# Patient Record
Sex: Male | Born: 1935
Health system: Southern US, Community
[De-identification: ages and names within clinical notes are randomized; demographics above are authoritative.]

## PROBLEM LIST (undated history)

## (undated) DIAGNOSIS — E1165 Type 2 diabetes mellitus with hyperglycemia: Secondary | ICD-10-CM

## (undated) DIAGNOSIS — H409 Unspecified glaucoma: Secondary | ICD-10-CM

## (undated) DIAGNOSIS — M1990 Unspecified osteoarthritis, unspecified site: Secondary | ICD-10-CM

## (undated) DIAGNOSIS — Z8489 Family history of other specified conditions: Secondary | ICD-10-CM

## (undated) DIAGNOSIS — T4145XA Adverse effect of unspecified anesthetic, initial encounter: Secondary | ICD-10-CM

## (undated) DIAGNOSIS — I10 Essential (primary) hypertension: Secondary | ICD-10-CM

## (undated) DIAGNOSIS — K118 Other diseases of salivary glands: Secondary | ICD-10-CM

## (undated) DIAGNOSIS — M109 Gout, unspecified: Secondary | ICD-10-CM

## (undated) DIAGNOSIS — I639 Cerebral infarction, unspecified: Secondary | ICD-10-CM

## (undated) DIAGNOSIS — T8859XA Other complications of anesthesia, initial encounter: Secondary | ICD-10-CM

## (undated) HISTORY — DX: Cerebral infarction, unspecified: I63.9

---
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
## 1999-10-30 ENCOUNTER — Emergency Department (HOSPITAL_COMMUNITY): Admission: EM | Admit: 1999-10-30 | Discharge: 1999-10-30 | Payer: Self-pay | Admitting: *Deleted

## 2005-07-18 ENCOUNTER — Emergency Department: Payer: Self-pay | Admitting: Emergency Medicine

## 2005-07-18 ENCOUNTER — Other Ambulatory Visit: Payer: Self-pay

## 2008-01-28 DIAGNOSIS — C4491 Basal cell carcinoma of skin, unspecified: Secondary | ICD-10-CM

## 2008-01-28 HISTORY — DX: Basal cell carcinoma of skin, unspecified: C44.91

## 2011-12-09 DIAGNOSIS — L570 Actinic keratosis: Secondary | ICD-10-CM | POA: Diagnosis not present

## 2011-12-09 DIAGNOSIS — D2320 Other benign neoplasm of skin of unspecified ear and external auricular canal: Secondary | ICD-10-CM | POA: Diagnosis not present

## 2011-12-09 DIAGNOSIS — Z85828 Personal history of other malignant neoplasm of skin: Secondary | ICD-10-CM | POA: Diagnosis not present

## 2011-12-09 DIAGNOSIS — L820 Inflamed seborrheic keratosis: Secondary | ICD-10-CM | POA: Diagnosis not present

## 2011-12-09 DIAGNOSIS — D485 Neoplasm of uncertain behavior of skin: Secondary | ICD-10-CM | POA: Diagnosis not present

## 2012-03-18 DIAGNOSIS — Z23 Encounter for immunization: Secondary | ICD-10-CM | POA: Diagnosis not present

## 2012-06-17 DIAGNOSIS — L708 Other acne: Secondary | ICD-10-CM | POA: Diagnosis not present

## 2012-06-17 DIAGNOSIS — K130 Diseases of lips: Secondary | ICD-10-CM | POA: Diagnosis not present

## 2012-06-17 DIAGNOSIS — L570 Actinic keratosis: Secondary | ICD-10-CM | POA: Diagnosis not present

## 2012-06-17 DIAGNOSIS — L578 Other skin changes due to chronic exposure to nonionizing radiation: Secondary | ICD-10-CM | POA: Diagnosis not present

## 2013-01-26 ENCOUNTER — Emergency Department (HOSPITAL_COMMUNITY): Payer: Medicare Other

## 2013-01-26 ENCOUNTER — Emergency Department (HOSPITAL_COMMUNITY)
Admission: EM | Admit: 2013-01-26 | Discharge: 2013-01-26 | Disposition: A | Discharge status: Home / Self Care | Payer: Medicare Other | Attending: Emergency Medicine | Admitting: Emergency Medicine

## 2013-01-26 ENCOUNTER — Encounter (HOSPITAL_COMMUNITY): Payer: Self-pay | Admitting: *Deleted

## 2013-01-26 DIAGNOSIS — Z8639 Personal history of other endocrine, nutritional and metabolic disease: Secondary | ICD-10-CM | POA: Insufficient documentation

## 2013-01-26 DIAGNOSIS — J9819 Other pulmonary collapse: Secondary | ICD-10-CM | POA: Diagnosis not present

## 2013-01-26 DIAGNOSIS — M25512 Pain in left shoulder: Secondary | ICD-10-CM

## 2013-01-26 DIAGNOSIS — R52 Pain, unspecified: Secondary | ICD-10-CM | POA: Diagnosis not present

## 2013-01-26 DIAGNOSIS — M25519 Pain in unspecified shoulder: Secondary | ICD-10-CM | POA: Insufficient documentation

## 2013-01-26 DIAGNOSIS — I10 Essential (primary) hypertension: Secondary | ICD-10-CM | POA: Insufficient documentation

## 2013-01-26 DIAGNOSIS — Z862 Personal history of diseases of the blood and blood-forming organs and certain disorders involving the immune mechanism: Secondary | ICD-10-CM | POA: Diagnosis not present

## 2013-01-26 DIAGNOSIS — IMO0001 Reserved for inherently not codable concepts without codable children: Secondary | ICD-10-CM | POA: Insufficient documentation

## 2013-01-26 DIAGNOSIS — R079 Chest pain, unspecified: Secondary | ICD-10-CM | POA: Diagnosis not present

## 2013-01-26 HISTORY — DX: Gout, unspecified: M10.9

## 2013-01-26 HISTORY — DX: Essential (primary) hypertension: I10

## 2013-01-26 LAB — BASIC METABOLIC PANEL
CO2: 21 mEq/L (ref 19–32)
Chloride: 104 mEq/L (ref 96–112)
Creatinine, Ser: 1.45 mg/dL — ABNORMAL HIGH (ref 0.50–1.35)
GFR calc Af Amer: 53 mL/min — ABNORMAL LOW (ref >90)
Sodium: 136 mEq/L (ref 135–145)

## 2013-01-26 LAB — CBC
HCT: 50 % (ref 39.0–52.0)
MCV: 90.4 fL (ref 78.0–100.0)
Platelets: 181 10*3/uL (ref 150–400)
RBC: 5.53 MIL/uL (ref 4.22–5.81)
RDW: 13 % (ref 11.5–15.5)
WBC: 8 10*3/uL (ref 4.0–10.5)

## 2013-01-26 LAB — POCT I-STAT TROPONIN I

## 2013-01-26 MED ORDER — IOHEXOL 350 MG/ML SOLN
100.0000 mL | Freq: Once | INTRAVENOUS | Status: AC | PRN
  Administered 2013-01-26: 100 mL via INTRAVENOUS

## 2013-01-26 MED ORDER — OXYCODONE-ACETAMINOPHEN 5-325 MG PO TABS: 1.0000 | ORAL_TABLET | Freq: Four times a day (QID) | ORAL | Status: DC | PRN

## 2013-01-26 MED ORDER — NAPROXEN 500 MG PO TABS: 500.0000 mg | ORAL_TABLET | Freq: Two times a day (BID) | ORAL | Status: DC

## 2013-01-26 MED ORDER — OXYCODONE-ACETAMINOPHEN 5-325 MG PO TABS
2.0000 | ORAL_TABLET | Freq: Once | ORAL | Status: AC
  Administered 2013-01-26: 2 via ORAL
  Filled 2013-01-26: qty 2

## 2013-01-26 NOTE — ED Notes (Signed)
Family at bedside. 

## 2013-01-26 NOTE — ED Notes (Signed)
Pt discharged.Vital signs stable and GCS 15 

## 2013-01-26 NOTE — ED Notes (Signed)
Report high bp-229/107 jaye cook md.

## 2013-01-26 NOTE — ED Notes (Signed)
Pt is here with left shoulder aching intermittently over last couple of weeks and now it is constant and radiates into chest and back.

## 2013-01-26 NOTE — ED Notes (Signed)
Patient transported to CT 

## 2013-01-26 NOTE — ED Provider Notes (Signed)
CSN: [id number]     Arrival date & time 01/26/13  1119 History     First MD Initiated Contact with Patient 01/26/13 1349     Chief Complaint  Patient presents with  . Left arm pain    (Consider location/radiation/quality/duration/timing/severity/associated sxs/prior Treatment) HPI 77 year old male with HTN who presents with left shoulder pain.   Patient reports that he has intermittent left shoulder and scapular pain for approximately 6 weeks. His pain recently worsened yesterday after lifting several heavy objects while cleaning out a barn.  Pain is located primarily in the scapular region and is severe in nature.  Pain is nonradiating.  No associated SOB or chest pain.  No recent fall or trauma.   Past Medical History  Diagnosis Date  . Gout   . Hypertension    Past Surgical History  Procedure Laterality Date  . Tonsillectomy     No family history on file. History  Substance Use Topics  . Smoking status: Never Smoker   . Smokeless tobacco: Not on file  . Alcohol Use: Yes     Comment: occ    Review of Systems  Constitutional: Negative for fever and chills.  Respiratory: Negative for shortness of breath.   Cardiovascular: Negative for chest pain and palpitations.  Gastrointestinal: Negative for nausea, vomiting and abdominal pain.  Genitourinary: Negative.   Musculoskeletal: Positive for myalgias and arthralgias.  Skin: Negative for rash.  Neurological: Negative for syncope, weakness and headaches.   Allergies  Review of patient's allergies indicates no known allergies.  Home Medications  No current outpatient prescriptions on file. BP 214/101  Pulse 65  Temp(Src) 98.3 F (36.8 C) (Oral)  Resp 16  SpO2 96% Physical Exam  Constitutional: He is oriented to person, place, and time. He appears well-developed and well-nourished.  HENT:  Head: Normocephalic and atraumatic.  Cardiovascular: Normal rate and regular rhythm.   No murmur heard. Pulmonary/Chest:  Effort normal and breath sounds normal. He has no wheezes. He has no rales.  Abdominal: Soft. He exhibits no distension. There is no tenderness. There is no rebound and no guarding.  Musculoskeletal:  Left shoulder - normal ROM.  No redness or erythema.  No tenderness to palpation anterior or posteriorly around the scapula.    Neurological: He is alert and oriented to person, place, and time.  Skin: Skin is warm and dry.  Psychiatric: He has a normal mood and affect.   ED Course   Procedures (including critical care time)  Labs Reviewed  BASIC METABOLIC PANEL - Abnormal; Notable for the following:    Glucose, Bld 166 (*)    Creatinine, Ser 1.45 (*)    GFR calc non Af Amer 46 (*)    GFR calc Af Amer 53 (*)    All other components within normal limits  CBC - Abnormal; Notable for the following:    Hemoglobin 18.0 (*)    All other components within normal limits  POCT I-STAT TROPONIN I   Dg Chest 2 View  01/26/2013   *RADIOLOGY REPORT*  Clinical Data: Left chest and arm pain.  CHEST - 2 VIEW  Comparison:  None.  Findings:  The heart size and mediastinal contours are within normal limits.  Both lungs are clear.  The visualized skeletal structures are unremarkable.  IMPRESSION: No active cardiopulmonary disease.   Original Report Authenticated By: Myles Rosenthal, M.D.   1. Shoulder pain, acute, left     MDM  77 year old male with HTN presents  with persistent L shoulder pain.  -  Good ROM and nontender on examination. -  Patient significantly hypertensive in the ED. patient was asymptomatic and thus did not require treatment for hypertension in the ED. -  Given presentation, examination, and BP there is concern for possible dissection.  I discussed this with attending Dr. Hyacinth Meeker.  Will obtain CTA to rule out dissection.  1640 - CT scan negative for dissection. We'll discharge home with Percocet and NSAIDs.  Will send patient home on amlodipine for hypertension. Patient instructed to follow  up with his PCP closely   Tommie Sams, DO 01/26/13 1640

## 2013-01-27 NOTE — ED Provider Notes (Signed)
I saw and evaluated the patient, reviewed the resident's note and I agree with the findings and plan.  Pertinent History: The patient has developed left shoulder pain which has occurred over the last couple of weeks that has become more persistent and has been constant for greater than 24 hours. He states that he was doing significant heavy lifting including cleaning out a barn after his father-in-law died recently. Pertinent Exam findings: Minimal pain with range of motion or palpation of the shoulder, clear heart and lung sounds, no other acute findings.  As the pain did not seem to be reproducible other sources were sought including cardiac and dissection, studies negative for any acute findings and the patient is stable for discharge. Again he agreed in 24 hours of ongoing symptoms with a normal EKG and troponin.  I personally interpreted the EKG as well as the resident and agree with the interpretation on the resident's chart.   Vida Roller, MD 01/27/13 1100

## 2013-03-22 ENCOUNTER — Ambulatory Visit (INDEPENDENT_AMBULATORY_CARE_PROVIDER_SITE_OTHER): Payer: Medicare Other | Admitting: Internal Medicine

## 2013-03-22 ENCOUNTER — Encounter: Payer: Self-pay | Admitting: Internal Medicine

## 2013-03-22 ENCOUNTER — Encounter (INDEPENDENT_AMBULATORY_CARE_PROVIDER_SITE_OTHER): Payer: Self-pay

## 2013-03-22 VITALS — BP 160/84 | HR 74 | Temp 98.1°F | Resp 14 | Ht 69.25 in | Wt 168.0 lb

## 2013-03-22 DIAGNOSIS — Z125 Encounter for screening for malignant neoplasm of prostate: Secondary | ICD-10-CM | POA: Diagnosis not present

## 2013-03-22 DIAGNOSIS — E559 Vitamin D deficiency, unspecified: Secondary | ICD-10-CM

## 2013-03-22 DIAGNOSIS — N400 Enlarged prostate without lower urinary tract symptoms: Secondary | ICD-10-CM

## 2013-03-22 DIAGNOSIS — E785 Hyperlipidemia, unspecified: Secondary | ICD-10-CM | POA: Diagnosis not present

## 2013-03-22 DIAGNOSIS — R209 Unspecified disturbances of skin sensation: Secondary | ICD-10-CM

## 2013-03-22 DIAGNOSIS — Z1211 Encounter for screening for malignant neoplasm of colon: Secondary | ICD-10-CM | POA: Diagnosis not present

## 2013-03-22 DIAGNOSIS — R200 Anesthesia of skin: Secondary | ICD-10-CM

## 2013-03-22 DIAGNOSIS — R5381 Other malaise: Secondary | ICD-10-CM

## 2013-03-22 DIAGNOSIS — M1990 Unspecified osteoarthritis, unspecified site: Secondary | ICD-10-CM

## 2013-03-22 DIAGNOSIS — K219 Gastro-esophageal reflux disease without esophagitis: Secondary | ICD-10-CM

## 2013-03-22 DIAGNOSIS — I10 Essential (primary) hypertension: Secondary | ICD-10-CM

## 2013-03-22 LAB — CBC WITH DIFFERENTIAL/PLATELET
Basophils Relative: 0.9 % (ref 0.0–3.0)
Eosinophils Relative: 3.4 % (ref 0.0–5.0)
HCT: 48.7 % (ref 39.0–52.0)
Monocytes Relative: 6.1 % (ref 3.0–12.0)
Neutrophils Relative %: 72 % (ref 43.0–77.0)
Platelets: 187 10*3/uL (ref 150.0–400.0)
RBC: 5.26 Mil/uL (ref 4.22–5.81)
WBC: 6.7 10*3/uL (ref 4.5–10.5)

## 2013-03-22 LAB — TSH: TSH: 1.29 u[IU]/mL (ref 0.35–5.50)

## 2013-03-22 LAB — LIPID PANEL
Cholesterol: 186 mg/dL (ref 0–200)
HDL: 43.5 mg/dL (ref >39.00)
Total CHOL/HDL Ratio: 4
Triglycerides: 218 mg/dL — ABNORMAL HIGH (ref 0.0–149.0)
VLDL: 43.6 mg/dL — ABNORMAL HIGH (ref 0.0–40.0)

## 2013-03-22 MED ORDER — ZOSTER VACCINE LIVE 19400 UNT/0.65ML ~~LOC~~ SOLR: 0.6500 mL | Freq: Once | SUBCUTANEOUS | Status: DC

## 2013-03-22 MED ORDER — TETANUS-DIPHTH-ACELL PERTUSSIS 5-2.5-18.5 LF-MCG/0.5 IM SUSP: 0.5000 mL | Freq: Once | INTRAMUSCULAR | Status: DC

## 2013-03-22 NOTE — Assessment & Plan Note (Signed)
Managed with diet.

## 2013-03-22 NOTE — Assessment & Plan Note (Signed)
Needs PSA and urinalysis

## 2013-03-22 NOTE — Patient Instructions (Signed)
Please return for your fasting annual exam  In 3 months   We will e mail you with the results of your labs.

## 2013-03-22 NOTE — Progress Notes (Signed)
Patient ID: Anthony Allen, male   DOB: 09-18-1935, 77 y.o.   MRN: [id number]  Patient Active Problem List   Diagnosis Date Noted  . Essential hypertension, benign 03/22/2013  . Osteoarthritis 03/22/2013  . Numbness and tingling in left hand 03/22/2013    Subjective:  CC:   Chief Complaint  Patient presents with  . Establish Care    HPI:   Anthony Allen is a 77 y.o. male who presents as a new patient to establish primary care with the chief complaint of Persistent left arm pain since July after falling down during house renovation,  Tripped and fell forward onto arms.  His left shoulder ached for a while,  Right elbow was skinned up,  Bruise on left forearm resolved but shoulder starting hurt.  Has been fdoing a lot of lifting and clearning out father in law's barnes,  Started persisistent pain in  shoulder and back ,  Took a lot of BC powders ,  Arthritis medication ,  Nothing helped, after a few weeks pain became so severe his  bp shot up and went to ER at [facility] with chest x ray and CT scan of chest done to rule other PE and Aortic dissection  No shoulder imaging done.,   Pain has now resolved but he has noticed decreased grip  And some numbness of the entire left hand comes and goes throughout the day.  No headaches or neck pain  History of migraines Brought on by wife's tobacco use.    Resting tremor fine, chronic, no progression   History of BPH 2006 treated with Flomax and another medication (not sure) .  Symptoms resolved, kept getting UTIs so he has not been on either since 2010.  Has not had a PSA in over 3 years    Hypertension, prescribed diuretic initially, but developed joint pain and weakness  Added ACE inhibitor which caused hypotension and dizziness ,  Was changed to  Amlodipine with no problems  "weak lungs"  Had bronchitis at age of 17 ,  And remote sinus problems.. No history of asthma.  never smoked, but lots of passive smoke exposure. None URIs in  years  Reflux;  Treated with dietary avoidance of spicy foods. . Has problems if eats late at night   Has never had a colonoscopy.  No FH of colon Ca.  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 Afraid to have one because of the sedation.  No history of allergies to medication or anesthesia.   Food intolerances,  Cramping with onions and cukes and garlic . Peanuts and OJ cause RUQ cramping  Has avoided them for 30 yrs.  Tannic acid foods cause cankers     Past Medical History  Diagnosis Date  . Gout   . Hypertension     Past Surgical History  Procedure Laterality Date  . Tonsillectomy      Family History  Problem Relation Age of Onset  . Arthritis Mother   . Stroke Father   . Hypertension Father   . Heart disease Father 60    AMI  . Kidney disease Father     bladder ca congenital loss of kidney  . Arthritis Maternal Grandmother   . Early death Daughter 21    aspiration    History   Social History  . Marital Status: Married    Spouse Name: N/A    Number of Children: N/A  . Years of Education: N/A   Occupational History  .  Not on file.   Social History Main Topics  . Smoking status: Never Smoker   . Smokeless tobacco: Never Used  . Alcohol Use: Yes     Comment: occ  . Drug Use: No  . Sexual Activity: Not on file   Other Topics Concern  . Not on file   Social History Narrative  . No narrative on file       @ALLHX @    Review of Systems:   The remainder of the review of systems was negative except those addressed in the HPI.       Objective:  BP 160/84  Pulse 74  Temp(Src) 98.1 F (36.7 C) (Oral)  Resp 14  Ht 5' 9.25" (1.759 m)  Wt 168 lb (76.204 kg)  BMI 24.63 kg/m2  SpO2 98%  General appearance: alert, cooperative and appears stated age Ears: normal TM's and external ear canals both ears Throat: lips, mucosa, and tongue normal; teeth and gums normal Neck: no adenopathy, no carotid bruit, supple,  symmetrical, trachea midline and thyroid not enlarged, symmetric, no tenderness/mass/nodules Back: symmetric, no curvature. ROM normal. No CVA tenderness. Lungs: clear to auscultation bilaterally Heart: regular rate and rhythm, S1, S2 normal, no murmur, click, rub or gallop Abdomen: soft, non-tender; bowel sounds normal; no masses,  no organomegaly Pulses: 2+ and symmetric Skin: Skin color, texture, turgor normal. No rashes or lesions Lymph nodes: Cervical, supraclavicular, and axillary nodes normal.  Assessment and Plan:  Numbness and tingling in left hand Etiology unclear; there is slight objective evidence of thenar atrophy without strength or sensation deficits. With no history of neck pain no history of tobacco abuse and normal recent chest CT, unlikely to be a tumor affecting the brachial plexus.  Records requested.   BPH (benign prostatic hyperplasia) Needs PSA and urinalysis  GERD (gastroesophageal reflux disease) Managed with diet.    Updated Medication List Outpatient Encounter Prescriptions as of 03/22/2013  Medication Sig Dispense Refill  . Acetaminophen (TYLENOL ARTHRITIS EXT RELIEF PO) Take 1-2 tablets by mouth 3 (three) times daily.      Marland Kitchen amLODipine (NORVASC) 5 MG tablet Take 1 tablet by mouth daily.      . celecoxib (CELEBREX) 200 MG capsule Take 200 mg by mouth 2 (two) times daily.      Marland Kitchen HYDROCORTISONE, TOPICAL, 2 % LOTN Apply 1 application topically as needed (itching).      . Liniments (SALONPAS EX) Apply 1 patch topically as needed (muscle pain).      . naproxen (NAPROSYN) 500 MG tablet Take 1 tablet (500 mg total) by mouth 2 (two) times daily with a meal.  30 tablet  0  . SALINE NA Place 2 sprays into both nostrils as needed (allergies).      . Tetrahydrozoline HCl (VISINE OP) Place 2 drops into both eyes daily as needed (allergies).      . trolamine salicylate (MYOFLEX) 10 % cream Apply 1 application topically as needed (muscle pain).      . TDaP (BOOSTRIX)  5-2.5-18.5 LF-MCG/0.5 injection Inject 0.5 mLs into the muscle once.  0.5 mL  0  . zoster vaccine live, PF, (ZOSTAVAX) 45409 UNT/0.65ML injection Inject 19,400 Units into the skin once.  1 each  0  . [DISCONTINUED] Aspirin-Salicylamide-Caffeine (BC HEADACHE POWDER PO) Take 1 packet by mouth 3 (three) times daily.      . [DISCONTINUED] oxyCODONE-acetaminophen (PERCOCET/ROXICET) 5-325 MG per tablet Take 1-2 tablets by mouth every 6 (six) hours as needed for pain.  30 tablet  0   No facility-administered encounter medications on file as of 03/22/2013.

## 2013-03-22 NOTE — Assessment & Plan Note (Signed)
Etiology unclear; there is slight objective evidence of thenar atrophy without strength or sensation deficits. With no history of neck pain no history of tobacco abuse and normal recent chest CT, unlikely to be a tumor affecting the brachial plexus.  Records requested.

## 2013-03-23 ENCOUNTER — Encounter: Payer: Self-pay | Admitting: *Deleted

## 2013-03-25 ENCOUNTER — Telehealth: Payer: Self-pay | Admitting: *Deleted

## 2013-03-25 DIAGNOSIS — I10 Essential (primary) hypertension: Secondary | ICD-10-CM

## 2013-03-25 MED ORDER — AMLODIPINE BESYLATE 5 MG PO TABS: 5.0000 mg | ORAL_TABLET | Freq: Every day | ORAL | Status: DC

## 2013-03-25 NOTE — Telephone Encounter (Signed)
Pt left VM, requesting RF on his BP medication. Rx sent to pharmacy by escript. LM for pt that Rx was sent and in the future to contact his pharmacy for refills to ensure accurate medication, dosage, pharmacy, etc.

## 2013-04-01 ENCOUNTER — Ambulatory Visit: Payer: Medicare Other | Admitting: Internal Medicine

## 2013-06-15 DIAGNOSIS — Z23 Encounter for immunization: Secondary | ICD-10-CM | POA: Diagnosis not present

## 2013-06-23 ENCOUNTER — Encounter: Payer: Self-pay | Admitting: Internal Medicine

## 2013-06-23 ENCOUNTER — Ambulatory Visit (INDEPENDENT_AMBULATORY_CARE_PROVIDER_SITE_OTHER): Payer: Medicare Other | Admitting: Internal Medicine

## 2013-06-23 ENCOUNTER — Encounter (INDEPENDENT_AMBULATORY_CARE_PROVIDER_SITE_OTHER): Payer: Self-pay

## 2013-06-23 VITALS — BP 172/84 | HR 80 | Temp 97.5°F | Resp 14 | Wt 166.2 lb

## 2013-06-23 DIAGNOSIS — Z125 Encounter for screening for malignant neoplasm of prostate: Secondary | ICD-10-CM | POA: Diagnosis not present

## 2013-06-23 DIAGNOSIS — I10 Essential (primary) hypertension: Secondary | ICD-10-CM

## 2013-06-23 DIAGNOSIS — K2970 Gastritis, unspecified, without bleeding: Secondary | ICD-10-CM

## 2013-06-23 DIAGNOSIS — Z23 Encounter for immunization: Secondary | ICD-10-CM

## 2013-06-23 DIAGNOSIS — R209 Unspecified disturbances of skin sensation: Secondary | ICD-10-CM

## 2013-06-23 DIAGNOSIS — K2990 Gastroduodenitis, unspecified, without bleeding: Secondary | ICD-10-CM

## 2013-06-23 DIAGNOSIS — Z0000 Encounter for general adult medical examination without abnormal findings: Secondary | ICD-10-CM | POA: Diagnosis not present

## 2013-06-23 DIAGNOSIS — E559 Vitamin D deficiency, unspecified: Secondary | ICD-10-CM

## 2013-06-23 DIAGNOSIS — R5381 Other malaise: Secondary | ICD-10-CM | POA: Diagnosis not present

## 2013-06-23 DIAGNOSIS — R5383 Other fatigue: Secondary | ICD-10-CM

## 2013-06-23 DIAGNOSIS — Z1211 Encounter for screening for malignant neoplasm of colon: Secondary | ICD-10-CM

## 2013-06-23 DIAGNOSIS — R202 Paresthesia of skin: Secondary | ICD-10-CM

## 2013-06-23 DIAGNOSIS — N400 Enlarged prostate without lower urinary tract symptoms: Secondary | ICD-10-CM

## 2013-06-23 DIAGNOSIS — R200 Anesthesia of skin: Secondary | ICD-10-CM

## 2013-06-23 LAB — COMPREHENSIVE METABOLIC PANEL
ALT: 29 U/L (ref 0–53)
AST: 21 U/L (ref 0–37)
Albumin: 4.3 g/dL (ref 3.5–5.2)
Alkaline Phosphatase: 85 U/L (ref 39–117)
BUN: 19 mg/dL (ref 6–23)
CALCIUM: 9.6 mg/dL (ref 8.4–10.5)
CHLORIDE: 104 meq/L (ref 96–112)
CO2: 29 meq/L (ref 19–32)
Creatinine, Ser: 1.7 mg/dL — ABNORMAL HIGH (ref 0.4–1.5)
GFR: 43.1 mL/min — AB (ref >60.00)
Glucose, Bld: 149 mg/dL — ABNORMAL HIGH (ref 70–99)
POTASSIUM: 4 meq/L (ref 3.5–5.1)
SODIUM: 139 meq/L (ref 135–145)
TOTAL PROTEIN: 7.9 g/dL (ref 6.0–8.3)
Total Bilirubin: 1.2 mg/dL (ref 0.3–1.2)

## 2013-06-23 LAB — PSA, MEDICARE: PSA: 1.52 ng/ml (ref 0.10–4.00)

## 2013-06-23 NOTE — Assessment & Plan Note (Signed)
Resolved with activity modification .

## 2013-06-23 NOTE — Assessment & Plan Note (Signed)
PSA ordered today,  Prostate exam done  No nodules

## 2013-06-23 NOTE — Patient Instructions (Addendum)
You had your annual Medicare wellness exam today   Please use the stool kit to send [place] back a sample to test for blood.  This is your colon CA screening test.   You still the TDaP vaccine and a Shingles vaccine.  Try the [facility] Dept for the best price  You received the pneumonia vaccine today.  We will contact you with the bloodwork results (PSA,  Fasting glucose,  Vit d ,  Liver and kidney function)   Your ear wax may dissolve after a few days of applying Debrox.  3 or 4 drops in each ear (alternate nights)  If not,  you can make appt with Juliann Pulse to have an irrigation done.   Managing Your High Blood Pressure Blood pressure is a measurement of how forceful your blood is pressing against the walls of the arteries. Arteries are muscular tubes within the circulatory system. Blood pressure does not stay the same. Blood pressure rises when you are active, excited, or nervous; and it lowers during sleep and relaxation. If the numbers measuring your blood pressure stay above normal most of the time, you are at risk for health problems. High blood pressure (hypertension) is a long-term (chronic) condition in which blood pressure is elevated. A blood pressure reading is recorded as two numbers, such as 120 over 80 (or 120/80). The first, higher number is called the systolic pressure. It is a measure of the pressure in your arteries as the heart beats. The second, lower number is called the diastolic pressure. It is a measure of the pressure in your arteries as the heart relaxes between beats.  Keeping your blood pressure in a normal range is important to your overall health and prevention of health problems, such as heart disease and stroke. When your blood pressure is uncontrolled, your heart has to work harder than normal. High blood pressure is a very common condition in adults because blood pressure tends to rise with age. Men and women are equally likely to have hypertension but at different times in  life. Before age 60, men are more likely to have hypertension. After 76 years of age, women are more likely to have it. Hypertension is especially common in African Americans. This condition often has no signs or symptoms. The cause of the condition is usually not known. Your caregiver can help you come up with a plan to keep your blood pressure in a normal, healthy range. BLOOD PRESSURE STAGES Blood pressure is classified into four stages: normal, prehypertension, stage 1, and stage 2. Your blood pressure reading will be used to determine what type of treatment, if any, is necessary. Appropriate treatment options are tied to these four stages:  Normal  Systolic pressure (mm Hg): below 120.  Diastolic pressure (mm Hg): below 80. Prehypertension  Systolic pressure (mm Hg): 120 to 139.  Diastolic pressure (mm Hg): 80 to 89. Stage1  Systolic pressure (mm Hg): 140 to 159.  Diastolic pressure (mm Hg): 90 to 99. Stage2  Systolic pressure (mm Hg): 160 or above.  Diastolic pressure (mm Hg): 100 or above. RISKS RELATED TO HIGH BLOOD PRESSURE Managing your blood pressure is an important responsibility. Uncontrolled high blood pressure can lead to:  A heart attack.  A stroke.  A weakened blood vessel (aneurysm).  Heart failure.  Kidney damage.  Eye damage.  Metabolic syndrome.  Memory and concentration problems. HOW TO MANAGE YOUR BLOOD PRESSURE Blood pressure can be managed effectively with lifestyle changes and medicines (if needed). Your caregiver  will help you come up with a plan to bring your blood pressure within a normal range. Your plan should include the following: Education  Read all information provided by your caregivers about how to control blood pressure.  Educate yourself on the latest guidelines and treatment recommendations. New research is always being done to further define the risks and treatments for high blood pressure. Lifestylechanges  Control your  weight.  Avoid smoking.  Stay physically active.  Reduce the amount of salt in your diet.  Reduce stress.  Control any chronic conditions, such as high cholesterol or diabetes.  Reduce your alcohol intake. Medicines  Several medicines (antihypertensive medicines) are available, if needed, to bring blood pressure within a normal range. Communication  Review all the medicines you take with your caregiver because there may be side effects or interactions.  Talk with your caregiver about your diet, exercise habits, and other lifestyle factors that may be contributing to high blood pressure.  See your caregiver regularly. Your caregiver can help you create and adjust your plan for managing high blood pressure. RECOMMENDATIONS FOR TREATMENT AND FOLLOW-UP  The following recommendations are based on current guidelines for managing high blood pressure in nonpregnant adults. Use these recommendations to identify the proper follow-up period or treatment option based on your blood pressure reading. You can discuss these options with your caregiver.  Systolic pressure of 695 to 072 or diastolic pressure of 80 to 89: Follow up with your caregiver as directed.  Systolic pressure of 257 to 505 or diastolic pressure of 90 to 100: Follow up with your caregiver within 2 months.  Systolic pressure above 183 or diastolic pressure above 358: Follow up with your caregiver within 1 month.  Systolic pressure above 251 or diastolic pressure above 898: Consider antihypertensive therapy; follow up with your caregiver within 1 week.  Systolic pressure above 421 or diastolic pressure above 031: Begin antihypertensive therapy; follow up with your caregiver within 1 week. Document Released: 02/19/2012 Document Reviewed: 02/19/2012 [facility] Patient Information 2014 [place], [place].

## 2013-06-23 NOTE — Assessment & Plan Note (Signed)
Well controlled on current regimen. Renal function stable, no changes today. 

## 2013-06-23 NOTE — Assessment & Plan Note (Signed)
Annual male exam was done including testicular and prostate exam. PSA is pending .  Colon ca screening was reviewed and options given.  FOBT cards accepted

## 2013-06-23 NOTE — Progress Notes (Signed)
Patient ID: Anthony Allen, male   DOB: [birth year], 78 y.o.   MRN: [id number]  The patient is here for annual Medicare wellness examination and management of other chronic and acute problems.  Increased anxiety ,  His 79 yr old Son has been hospitalized after leaving home in a decompensated manic state,  THe has a long history of bipolar d/o and lives with them ,  Has religious delusions.   Not sleeping well.  Averages 6 hours,  Takes occasional naps during day,  denies hypersomnolence.  More irritable  Lately,  Denies trouble concentrating. . Averaging 2 drinks per night pf alcohol.  No other habits.     The risk factors are reflected in the social history.  The roster of all physicians providing medical care to patient - is listed in the Snapshot section of the chart.  Activities of daily living:  The patient is 100% independent in all ADLs: dressing, toileting, feeding as well as independent mobility  Home safety : The patient has smoke detectors in the home. They wear seatbelts.  There are no firearms at home. There is no violence in the home.   There is no risks for hepatitis, STDs or HIV. There is no   history of blood transfusion. They have no travel history to infectious disease endemic areas of the world.  The patient has seen their dentist in the last six month. They have seen their eye doctor in the last year. They admit to slight hearing difficulty with regard to whispered voices and some television programs.  They have deferred audiologic testing in the last year.  They do not  have excessive sun exposure. Discussed the need for sun protection: hats, long sleeves and use of sunscreen if there is significant sun exposure.   Diet: the importance of a healthy diet is discussed. They do have a healthy diet.  The benefits of regular aerobic exercise were discussed. She walks 4 times per week ,  20 minutes.   Depression screen: there are no signs or vegative symptoms of depression-  irritability, change in appetite, anhedonia, sadness/tearfullness.  Cognitive assessment: the patient manages all their financial and personal affairs and is actively engaged. They could relate day,date,year and events; recalled 2/3 objects at 3 minutes; performed clock-face test normally.  The following portions of the patient's history were reviewed and updated as appropriate: allergies, current medications, past family history, past medical history,  past surgical history, past social history  and problem list.  Visual acuity was not assessed per patient preference since she has regular follow up with her ophthalmologist. Hearing and body mass index were assessed and reviewed.   During the course of the visit the patient was educated and counseled about appropriate screening and preventive services including : fall prevention , diabetes screening, nutrition counseling, colorectal cancer screening, and recommended immunizations.    Objective:   BP 172/84  Pulse 80  Temp(Src) 97.5 F (36.4 C) (Oral)  Resp 14  Wt 166 lb 4 oz (75.411 kg)  SpO2 97%  General Appearance:    Alert, cooperative, no distress, appears stated age  Head:    Normocephalic, without obvious abnormality, atraumatic  Eyes:    PERRL, conjunctiva/corneas clear, EOM's intact, fundi    benign, both eyes       Ears:    Normal TM's and external ear canals, both ears  Nose:   Nares normal, septum midline, mucosa normal, no drainage   or sinus tenderness  Throat:   Lips,  mucosa, and tongue normal; teeth and gums normal  Neck:   Supple, symmetrical, trachea midline, no adenopathy;       thyroid:  No enlargement/tenderness/nodules; no carotid   bruit or JVD  Back:     Symmetric, no curvature, ROM normal, no CVA tenderness  Lungs:     Clear to auscultation bilaterally, respirations unlabored  Chest wall:    No tenderness or deformity  Heart:    Regular rate and rhythm, S1 and S2 normal, no murmur, rub   or gallop  Abdomen:      Soft, non-tender, bowel sounds active all four quadrants,    no masses, no organomegaly  Genitalia:    Normal male without lesion, discharge or tenderness  Rectal:    Normal tone, normal prostate, no masses or tenderness;   guaiac negative stool  Extremities:   Extremities normal, atraumatic, no cyanosis or edema  Pulses:   2+ and symmetric all extremities  Skin:   Skin color, texture, turgor normal, no rashes or lesions  Lymph nodes:   Cervical, supraclavicular, and axillary nodes normal  Neurologic:   CNII-XII intact. Normal strength, sensation and reflexes      throughout    Assessment and Plan:  BPH (benign prostatic hyperplasia) PSA ordered today,  Prostate exam done  No nodules   Essential hypertension, benign Well controlled on current regimen. Renal function stable, no changes today.  Medicare annual wellness visit, subsequent Annual male exam was done including testicular and prostate exam. PSA is pending .  Colon ca screening was reviewed and options given.  FOBT cards accepted  Numbness and tingling in left hand Resolved with activity modification .     Updated Medication List Outpatient Encounter Prescriptions as of 06/23/2013  Medication Sig  . Acetaminophen (TYLENOL ARTHRITIS EXT RELIEF PO) Take 1-2 tablets by mouth 3 (three) times daily.  Marland Kitchen amLODipine (NORVASC) 5 MG tablet Take 1 tablet (5 mg total) by mouth daily.  Marland Kitchen HYDROCORTISONE, TOPICAL, 2 % LOTN Apply 1 application topically as needed (itching).  . naproxen (NAPROSYN) 500 MG tablet Take 1 tablet (500 mg total) by mouth 2 (two) times daily with a meal.  . naproxen sodium (ANAPROX) 220 MG tablet Take 220 mg by mouth 2 (two) times daily with a meal.  . SALINE NA Place 2 sprays into both nostrils as needed (allergies).  . Tetrahydrozoline HCl (VISINE OP) Place 2 drops into both eyes daily as needed (allergies).  . trolamine salicylate (MYOFLEX) 10 % cream Apply 1 application topically as needed (muscle  pain).  . celecoxib (CELEBREX) 200 MG capsule Take 200 mg by mouth 2 (two) times daily.  . Liniments (SALONPAS EX) Apply 1 patch topically as needed (muscle pain).  . TDaP (BOOSTRIX) 5-2.5-18.5 LF-MCG/0.5 injection Inject 0.5 mLs into the muscle once.  . zoster vaccine live, PF, (ZOSTAVAX) 93570 UNT/0.65ML injection Inject 19,400 Units into the skin once.

## 2013-06-23 NOTE — Addendum Note (Signed)
Addended by: Leeanne Rio on: 06/23/2013 05:15 PM   Modules accepted: Orders

## 2013-06-23 NOTE — Progress Notes (Signed)
Pre-visit discussion using our clinic review tool. No additional management support is needed unless otherwise documented below in the visit note.  

## 2013-06-24 LAB — VITAMIN D 25 HYDROXY (VIT D DEFICIENCY, FRACTURES): Vit D, 25-Hydroxy: 12 ng/mL — ABNORMAL LOW (ref 30–89)

## 2013-06-27 DIAGNOSIS — E559 Vitamin D deficiency, unspecified: Secondary | ICD-10-CM | POA: Insufficient documentation

## 2013-06-27 MED ORDER — ERGOCALCIFEROL 1.25 MG (50000 UT) PO CAPS: 50000.0000 [IU] | ORAL_CAPSULE | ORAL | Status: DC

## 2013-06-27 NOTE — Assessment & Plan Note (Addendum)
3 months of weekly Drisdol recommended followed by daily supplementation using 1000 units daily.

## 2013-06-27 NOTE — Addendum Note (Signed)
Addended by: Crecencio Mc on: 06/27/2013 09:05 AM   Modules accepted: Orders

## 2013-06-29 ENCOUNTER — Telehealth: Payer: Self-pay | Admitting: Internal Medicine

## 2013-06-29 ENCOUNTER — Ambulatory Visit (INDEPENDENT_AMBULATORY_CARE_PROVIDER_SITE_OTHER): Payer: Medicare Other | Admitting: *Deleted

## 2013-06-29 DIAGNOSIS — H6123 Impacted cerumen, bilateral: Secondary | ICD-10-CM

## 2013-06-29 DIAGNOSIS — H6120 Impacted cerumen, unspecified ear: Secondary | ICD-10-CM

## 2013-06-29 NOTE — Progress Notes (Signed)
Patient seen in office Nurse visit both bilateral cercrumen  Impaction ears lavaged took approximately 30 min bilateral ears clear.

## 2013-06-29 NOTE — Telephone Encounter (Signed)
Pt came in today wanting to get the results to his lab work he had done last week

## 2013-06-29 NOTE — Progress Notes (Signed)
   Subjective:    Patient ID: Anthony Allen, male    DOB: January 15, 1936, 78 y.o.   MRN: [id number]  HPI    Review of Systems     Objective:   Physical Exam        Assessment & Plan:

## 2013-07-01 NOTE — Telephone Encounter (Signed)
Patient notified

## 2013-07-14 ENCOUNTER — Telehealth: Payer: Self-pay | Admitting: Internal Medicine

## 2013-07-14 NOTE — Telephone Encounter (Signed)
Relevant patient education assigned to patient using Emmi. ° °

## 2013-09-02 ENCOUNTER — Ambulatory Visit (INDEPENDENT_AMBULATORY_CARE_PROVIDER_SITE_OTHER): Payer: Medicare Other | Admitting: *Deleted

## 2013-09-02 DIAGNOSIS — Z1211 Encounter for screening for malignant neoplasm of colon: Secondary | ICD-10-CM | POA: Diagnosis not present

## 2013-09-02 LAB — FECAL OCCULT BLOOD, IMMUNOCHEMICAL: Fecal Occult Bld: NEGATIVE

## 2013-09-05 LAB — FECAL OCCULT BLOOD, GUAIAC: Fecal Occult Blood: NEGATIVE

## 2013-09-06 ENCOUNTER — Encounter: Payer: Self-pay | Admitting: *Deleted

## 2013-09-08 ENCOUNTER — Telehealth: Payer: Self-pay | Admitting: Internal Medicine

## 2013-09-08 DIAGNOSIS — E785 Hyperlipidemia, unspecified: Secondary | ICD-10-CM

## 2013-09-08 DIAGNOSIS — R739 Hyperglycemia, unspecified: Secondary | ICD-10-CM

## 2013-09-08 NOTE — Telephone Encounter (Signed)
Scheduled appt 4/16 for lab work.  No order in.

## 2013-09-08 NOTE — Telephone Encounter (Signed)
Last lab note says fasting labs and glucose/CMET, I can order, anything else?

## 2013-09-08 NOTE — Telephone Encounter (Signed)
Thanks ,  Ordered labs.

## 2013-09-21 ENCOUNTER — Other Ambulatory Visit: Payer: Self-pay | Admitting: *Deleted

## 2013-09-21 DIAGNOSIS — I10 Essential (primary) hypertension: Secondary | ICD-10-CM

## 2013-09-21 MED ORDER — AMLODIPINE BESYLATE 5 MG PO TABS
5.0000 mg | ORAL_TABLET | Freq: Every day | ORAL | Status: DC
Start: 2013-09-21 — End: 2014-03-23

## 2013-09-23 ENCOUNTER — Other Ambulatory Visit (INDEPENDENT_AMBULATORY_CARE_PROVIDER_SITE_OTHER): Payer: Medicare Other

## 2013-09-23 DIAGNOSIS — R739 Hyperglycemia, unspecified: Secondary | ICD-10-CM

## 2013-09-23 DIAGNOSIS — R7309 Other abnormal glucose: Secondary | ICD-10-CM

## 2013-09-23 DIAGNOSIS — E785 Hyperlipidemia, unspecified: Secondary | ICD-10-CM

## 2013-09-23 LAB — COMPREHENSIVE METABOLIC PANEL
ALBUMIN: 3.9 g/dL (ref 3.5–5.2)
ALT: 35 U/L (ref 0–53)
AST: 25 U/L (ref 0–37)
Alkaline Phosphatase: 78 U/L (ref 39–117)
BILIRUBIN TOTAL: 1.3 mg/dL — AB (ref 0.3–1.2)
BUN: 15 mg/dL (ref 6–23)
CO2: 29 meq/L (ref 19–32)
Calcium: 9.8 mg/dL (ref 8.4–10.5)
Chloride: 102 mEq/L (ref 96–112)
Creatinine, Ser: 1.7 mg/dL — ABNORMAL HIGH (ref 0.4–1.5)
GFR: 41.63 mL/min — AB (ref >60.00)
Glucose, Bld: 154 mg/dL — ABNORMAL HIGH (ref 70–99)
Potassium: 4.1 mEq/L (ref 3.5–5.1)
SODIUM: 138 meq/L (ref 135–145)
TOTAL PROTEIN: 7.3 g/dL (ref 6.0–8.3)

## 2013-09-23 LAB — LIPID PANEL
CHOLESTEROL: 182 mg/dL (ref 0–200)
HDL: 39.6 mg/dL (ref >39.00)
LDL Cholesterol: 107 mg/dL — ABNORMAL HIGH (ref 0–99)
Total CHOL/HDL Ratio: 5
Triglycerides: 177 mg/dL — ABNORMAL HIGH (ref 0.0–149.0)
VLDL: 35.4 mg/dL (ref 0.0–40.0)

## 2013-09-23 LAB — HEMOGLOBIN A1C: Hgb A1c MFr Bld: 6.4 % (ref 4.6–6.5)

## 2014-01-31 DIAGNOSIS — L723 Sebaceous cyst: Secondary | ICD-10-CM | POA: Diagnosis not present

## 2014-01-31 DIAGNOSIS — L578 Other skin changes due to chronic exposure to nonionizing radiation: Secondary | ICD-10-CM | POA: Diagnosis not present

## 2014-01-31 DIAGNOSIS — L708 Other acne: Secondary | ICD-10-CM | POA: Diagnosis not present

## 2014-01-31 DIAGNOSIS — Z85828 Personal history of other malignant neoplasm of skin: Secondary | ICD-10-CM | POA: Diagnosis not present

## 2014-01-31 DIAGNOSIS — D239 Other benign neoplasm of skin, unspecified: Secondary | ICD-10-CM | POA: Diagnosis not present

## 2014-01-31 DIAGNOSIS — L570 Actinic keratosis: Secondary | ICD-10-CM | POA: Diagnosis not present

## 2014-02-21 DIAGNOSIS — Z23 Encounter for immunization: Secondary | ICD-10-CM | POA: Diagnosis not present

## 2014-03-23 ENCOUNTER — Other Ambulatory Visit: Payer: Self-pay | Admitting: Internal Medicine

## 2014-05-27 ENCOUNTER — Ambulatory Visit (INDEPENDENT_AMBULATORY_CARE_PROVIDER_SITE_OTHER): Payer: Medicare Other | Admitting: Internal Medicine

## 2014-05-27 ENCOUNTER — Ambulatory Visit: Payer: BC Managed Care – PPO | Admitting: Internal Medicine

## 2014-05-27 ENCOUNTER — Telehealth: Payer: Self-pay | Admitting: Internal Medicine

## 2014-05-27 VITALS — BP 144/88 | HR 85 | Temp 97.7°F | Resp 14 | Ht 69.25 in | Wt 170.8 lb

## 2014-05-27 DIAGNOSIS — H109 Unspecified conjunctivitis: Secondary | ICD-10-CM

## 2014-05-27 DIAGNOSIS — B303 Acute epidemic hemorrhagic conjunctivitis (enteroviral): Secondary | ICD-10-CM | POA: Diagnosis not present

## 2014-05-27 MED ORDER — CIPROFLOXACIN HCL 0.3 % OP SOLN: 2.0000 [drp] | OPHTHALMIC | Status: DC

## 2014-05-27 NOTE — Telephone Encounter (Signed)
Please call and see if his eye is still bothering him; add to afternoon schedule if so.
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
## 2014-05-27 NOTE — Telephone Encounter (Signed)
The patient has been scheduled for today at 4:15.

## 2014-05-27 NOTE — Patient Instructions (Signed)
I am prescribing ciprofloxacin eye drops to use every 2-4 hours while awake   You can APPLY COOL COMPRESSES TO THE EYELID  yOU HAVE AN APPT AT [facility] AT 2:30 ON MONDAY  With Dr Royann Shivers  Bacterial Conjunctivitis Bacterial conjunctivitis, commonly called pink eye, is an inflammation of the clear membrane that covers the white part of the eye (conjunctiva). The inflammation can also happen on the underside of the eyelids. The blood vessels in the conjunctiva become inflamed, causing the eye to become red or pink. Bacterial conjunctivitis may spread easily from one eye to another and from person to person (contagious).  CAUSES  Bacterial conjunctivitis is caused by bacteria. The bacteria may come from your own skin, your upper respiratory tract, or from someone else with bacterial conjunctivitis. SYMPTOMS  The normally white color of the eye or the underside of the eyelid is usually pink or red. The pink eye is usually associated with irritation, tearing, and some sensitivity to light. Bacterial conjunctivitis is often associated with a thick, yellowish discharge from the eye. The discharge may turn into a crust on the eyelids overnight, which causes your eyelids to stick together. If a discharge is present, there may also be some blurred vision in the affected eye. DIAGNOSIS  Bacterial conjunctivitis is diagnosed by your caregiver through an eye exam and the symptoms that you report. Your caregiver looks for changes in the surface tissues of your eyes, which may point to the specific type of conjunctivitis. A sample of any discharge may be collected on a cotton-tip swab if you have a severe case of conjunctivitis, if your cornea is affected, or if you keep getting repeat infections that do not respond to treatment. The sample will be sent to a lab to see if the inflammation is caused by a bacterial infection and to see if the infection will respond to antibiotic medicines. TREATMENT   Bacterial  conjunctivitis is treated with antibiotics. Antibiotic eyedrops are most often used. However, antibiotic ointments are also available. Antibiotics pills are sometimes used. Artificial tears or eye washes may ease discomfort. HOME CARE INSTRUCTIONS   To ease discomfort, apply a cool, clean washcloth to your eye for 10-20 minutes, 3-4 times a day.  Gently wipe away any drainage from your eye with a warm, wet washcloth or a cotton ball.  Wash your hands often with soap and water. Use paper towels to dry your hands.  Do not share towels or washcloths. This may spread the infection.  Change or wash your pillowcase every day.  You should not use eye makeup until the infection is gone.  Do not operate machinery or drive if your vision is blurred.  Stop using contact lenses. Ask your caregiver how to sterilize or replace your contacts before using them again. This depends on the type of contact lenses that you use.  When applying medicine to the infected eye, do not touch the edge of your eyelid with the eyedrop bottle or ointment tube. SEEK IMMEDIATE MEDICAL CARE IF:   Your infection has not improved within 3 days after beginning treatment.  You had yellow discharge from your eye and it returns.  You have increased eye pain.  Your eye redness is spreading.  Your vision becomes blurred.  You have a fever or persistent symptoms for more than 2-3 days.  You have a fever and your symptoms suddenly get worse.  You have facial pain, redness, or swelling. MAKE SURE YOU:   Understand these instructions.  Will watch your condition.  Will get help right away if you are not doing well or get worse. Document Released: 05/27/2005 Document Revised: 10/11/2013 Document Reviewed: 10/28/2011 [facility] Patient Information 2015 [place], [place]. This information is not intended to replace advice given to you by your health care provider. Make sure you discuss any questions you have with your health  care provider.

## 2014-05-29 ENCOUNTER — Encounter: Payer: Self-pay | Admitting: Internal Medicine

## 2014-05-29 DIAGNOSIS — B303 Acute epidemic hemorrhagic conjunctivitis (enteroviral): Secondary | ICD-10-CM | POA: Insufficient documentation
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
## 2014-05-30 DIAGNOSIS — B303 Acute epidemic hemorrhagic conjunctivitis (enteroviral): Secondary | ICD-10-CM | POA: Diagnosis not present

## 2014-06-21 DIAGNOSIS — H40003 Preglaucoma, unspecified, bilateral: Secondary | ICD-10-CM | POA: Diagnosis not present

## 2014-09-26 ENCOUNTER — Other Ambulatory Visit: Payer: Self-pay | Admitting: Internal Medicine

## 2014-10-19 DIAGNOSIS — H40003 Preglaucoma, unspecified, bilateral: Secondary | ICD-10-CM | POA: Diagnosis not present

## 2014-10-27 DIAGNOSIS — H40003 Preglaucoma, unspecified, bilateral: Secondary | ICD-10-CM | POA: Diagnosis not present

## 2015-02-26 DIAGNOSIS — Z23 Encounter for immunization: Secondary | ICD-10-CM | POA: Diagnosis not present

## 2015-03-25 ENCOUNTER — Other Ambulatory Visit: Payer: Self-pay | Admitting: Internal Medicine

## 2015-04-26 ENCOUNTER — Other Ambulatory Visit: Payer: Self-pay | Admitting: Internal Medicine

## 2015-04-26 NOTE — Telephone Encounter (Signed)
Last OV 12.18.15.  Please advise refill 

## 2015-04-27 NOTE — Telephone Encounter (Signed)
Refill for 30 days only.  Has not been seen in 11 months so patient needs a  30 minute visit for annual exam

## 2015-05-02 ENCOUNTER — Encounter: Payer: Self-pay | Admitting: Internal Medicine

## 2015-05-02 ENCOUNTER — Ambulatory Visit (INDEPENDENT_AMBULATORY_CARE_PROVIDER_SITE_OTHER): Payer: Medicare Other | Admitting: Internal Medicine

## 2015-05-02 VITALS — BP 162/82 | HR 75 | Temp 98.1°F | Resp 14 | Ht 69.0 in | Wt 167.5 lb

## 2015-05-02 DIAGNOSIS — Z1211 Encounter for screening for malignant neoplasm of colon: Secondary | ICD-10-CM | POA: Diagnosis not present

## 2015-05-02 DIAGNOSIS — E559 Vitamin D deficiency, unspecified: Secondary | ICD-10-CM

## 2015-05-02 DIAGNOSIS — Z23 Encounter for immunization: Secondary | ICD-10-CM | POA: Diagnosis not present

## 2015-05-02 DIAGNOSIS — I10 Essential (primary) hypertension: Secondary | ICD-10-CM | POA: Diagnosis not present

## 2015-05-02 DIAGNOSIS — M158 Other polyosteoarthritis: Secondary | ICD-10-CM

## 2015-05-02 MED ORDER — AMLODIPINE BESYLATE 10 MG PO TABS
10.0000 mg | ORAL_TABLET | Freq: Every day | ORAL | Status: DC
Start: 2015-05-02 — End: 2015-07-21

## 2015-05-02 NOTE — Progress Notes (Signed)
Pre-visit discussion using our clinic review tool. No additional management support is needed unless otherwise documented below in the visit note.  

## 2015-05-02 NOTE — Patient Instructions (Addendum)
I am increasing your amlodipine to 10 mg daily  Goal BP is < 130/80 but > 100/60  I will see you again in 6 months for your annual exam   We will send off the order for Cologuard,  your annual colon ca screening test that can be done at home

## 2015-05-03 LAB — COMPREHENSIVE METABOLIC PANEL
ALBUMIN: 4.3 g/dL (ref 3.5–5.2)
ALT: 27 U/L (ref 0–53)
AST: 19 U/L (ref 0–37)
Alkaline Phosphatase: 93 U/L (ref 39–117)
BILIRUBIN TOTAL: 1 mg/dL (ref 0.2–1.2)
BUN: 13 mg/dL (ref 6–23)
CALCIUM: 10.1 mg/dL (ref 8.4–10.5)
CHLORIDE: 100 meq/L (ref 96–112)
CO2: 23 mEq/L (ref 19–32)
Creatinine, Ser: 1.44 mg/dL (ref 0.40–1.50)
GFR: 50.54 mL/min — AB (ref >60.00)
Glucose, Bld: 106 mg/dL — ABNORMAL HIGH (ref 70–99)
Potassium: 3.4 mEq/L — ABNORMAL LOW (ref 3.5–5.1)
Sodium: 139 mEq/L (ref 135–145)
Total Protein: 7.5 g/dL (ref 6.0–8.3)

## 2015-05-03 LAB — VITAMIN D 25 HYDROXY (VIT D DEFICIENCY, FRACTURES): VITD: 37.49 ng/mL (ref 30.00–100.00)

## 2015-05-04 DIAGNOSIS — Z1211 Encounter for screening for malignant neoplasm of colon: Secondary | ICD-10-CM | POA: Insufficient documentation

## 2015-05-04 NOTE — Progress Notes (Signed)
Subjective:  Patient ID: Anthony Allen, male    DOB: [birth year]  Age: 79 y.o. MRN: [id number]  CC: The primary encounter diagnosis was Vitamin D deficiency. Diagnoses of Essential hypertension, Need for prophylactic vaccination against Streptococcus pneumoniae (pneumococcus), Essential hypertension, benign, Colon cancer screening, and Other osteoarthritis involving multiple joints were also pertinent to this visit.  HPI Anthony Allen presents for follow up on hypertension, osteoarthritis and other chronic conditions. He has been emotionally stressed by the psychiatric instability of his son who has a dual diagnosis of bipolar diagnosis and schizophrenia. He has occasional insomnia but manages it without medications.  He is tolerating his medications without side effects.  Outpatient Prescriptions Prior to Visit  Medication Sig Dispense Refill  . Acetaminophen (TYLENOL ARTHRITIS EXT RELIEF PO) Take 1-2 tablets by mouth 3 (three) times daily.    . ergocalciferol (VITAMIN D2) 50000 UNITS capsule Take 1 capsule (50,000 Units total) by mouth once a week. 12 capsule 0  . HYDROCORTISONE, TOPICAL, 2 % LOTN Apply 1 application topically as needed (itching).    . SALINE NA Place 2 sprays into both nostrils as needed (allergies).    . Tetrahydrozoline HCl (VISINE OP) Place 2 drops into both eyes daily as needed (allergies).    . trolamine salicylate (MYOFLEX) 10 % cream Apply 1 application topically as needed (muscle pain).    Marland Kitchen amLODipine (NORVASC) 5 MG tablet take 1 tablet by mouth once daily 30 tablet 0  . Liniments (SALONPAS EX) Apply 1 patch topically as needed (muscle pain).    . celecoxib (CELEBREX) 200 MG capsule Take 200 mg by mouth 2 (two) times daily.    . ciprofloxacin (CILOXAN) 0.3 % ophthalmic solution Place 2 drops into the left eye every 2 (two) hours while awake. Administer 1 drop, every 2 hours, while awake, for 2 days. Then 1 drop, every 4 hours, while awake, for the next 5 days. 5  mL 0  . TDaP (BOOSTRIX) 5-2.5-18.5 LF-MCG/0.5 injection Inject 0.5 mLs into the muscle once. 0.5 mL 0  . zoster vaccine live, PF, (ZOSTAVAX) 57846 UNT/0.65ML injection Inject 19,400 Units into the skin once. 1 each 0   No facility-administered medications prior to visit.    Review of Systems;  Patient denies headache, fevers, malaise, unintentional weight loss, skin rash, eye pain, sinus congestion and sinus pain, sore throat, dysphagia,  hemoptysis , cough, dyspnea, wheezing, chest pain, palpitations, orthopnea, edema, abdominal pain, nausea, melena, diarrhea, constipation, flank pain, dysuria, hematuria, urinary  Frequency, nocturia, numbness, tingling, seizures,  Focal weakness, Loss of consciousness,  Tremor, insomnia, depression, anxiety, and suicidal ideation.      Objective:  BP 162/82 mmHg  Pulse 75  Temp(Src) 98.1 F (36.7 C) (Oral)  Resp 14  Ht 5\' 9"  (1.753 m)  Wt 167 lb 8 oz (75.978 kg)  BMI 24.72 kg/m2  SpO2 96%  BP Readings from Last 3 Encounters:  05/02/15 162/82  05/27/14 144/88  06/23/13 172/84    Wt Readings from Last 3 Encounters:  05/02/15 167 lb 8 oz (75.978 kg)  05/27/14 170 lb 12 oz (77.452 kg)  06/23/13 166 lb 4 oz (75.411 kg)    General appearance: alert, cooperative and appears stated age Ears: normal TM's and external ear canals both ears Throat: lips, mucosa, and tongue normal; teeth and gums normal Neck: no adenopathy, no carotid bruit, supple, symmetrical, trachea midline and thyroid not enlarged, symmetric, no tenderness/mass/nodules Back: symmetric, no curvature. ROM normal. No CVA tenderness. Lungs: clear to auscultation bilaterally  Heart: regular rate and rhythm, S1, S2 normal, no murmur, click, rub or gallop Abdomen: soft, non-tender; bowel sounds normal; no masses,  no organomegaly Pulses: 2+ and symmetric Skin: Skin color, texture, turgor normal. No rashes or lesions Lymph nodes: Cervical, supraclavicular, and axillary nodes  normal.  Lab Results  Component Value Date   HGBA1C 6.4 09/23/2013    Lab Results  Component Value Date   CREATININE 1.44 05/02/2015   CREATININE 1.7* 09/23/2013   CREATININE 1.7* 06/23/2013    Lab Results  Component Value Date   WBC 6.7 03/22/2013   HGB 17.0 03/22/2013   HCT 48.7 03/22/2013   PLT 187.0 03/22/2013   GLUCOSE 106* 05/02/2015   CHOL 182 09/23/2013   TRIG 177.0* 09/23/2013   HDL 39.60 09/23/2013   LDLDIRECT 117.7 03/22/2013   LDLCALC 107* 09/23/2013   ALT 27 05/02/2015   AST 19 05/02/2015   NA 139 05/02/2015   K 3.4* 05/02/2015   CL 100 05/02/2015   CREATININE 1.44 05/02/2015   BUN 13 05/02/2015   CO2 23 05/02/2015   TSH 1.29 03/22/2013   PSA 1.52 06/23/2013   HGBA1C 6.4 09/23/2013    Dg Chest 2 View  01/26/2013  *RADIOLOGY REPORT* Clinical Data: Left chest and arm pain. CHEST - 2 VIEW Comparison:  None. Findings:  The heart size and mediastinal contours are within normal limits.  Both lungs are clear.  The visualized skeletal structures are unremarkable. IMPRESSION: No active cardiopulmonary disease. Original Report Authenticated By: Earle Gell, M.D.   Ct Angio Chest Aortic Dissect W &/or W/o  01/26/2013  *RADIOLOGY REPORT* Clinical Data: concerned for aortic dissection due to pain radiating from left arm across back, symptoms for several weeks CT ANGIOGRAPHY CHEST Technique:  Multidetector CT imaging of the chest using the standard protocol during bolus administration of intravenous contrast. Multiplanar reconstructed images including MIPs were obtained and reviewed to evaluate the vascular anatomy. Contrast: 150mL OMNIPAQUE IOHEXOL 350 MG/ML SOLN Comparison: 01/26/13 chest radiograph Findings: Except for bilateral dependent atelectasis, the lungs are clear.  There are no acute musculoskeletal abnormalities.  There is no evidence of aortic dissection or dilatation.  There is no pulmonary arterial filling defect to suggest embolism.  There is minimal coronary  arterial calcification.  There is mild thoracic aortic calcification and moderate calcification of the abdominal aorta.  There is a low attenuation lesion in the right lobe of the liver centrally measuring 11 mm.  It is too small to accurately characterize based on attenuation value. There is atherosclerotic plaque at the origin of the left subclavian artery and distally involving the left subclavian artery.  There is mild calcification at the origin of the right subclavian artery. IMPRESSION: 1.  No evidence of aortic dissection or dilatation 2.  Minimal coronary calcification.  Mild thoracic and moderate abdominal aortic calcification. 3.  Indeterminate and 11 mm liver lesion possibly a small cyst or hemangioma.  Non emergent evaluation as an outpatient with hemangioma protocol MRI could be considered. Original Report Authenticated By: Skipper Cliche, M.D.    Assessment & Plan:   Problem List Items Addressed This Visit    Essential hypertension, benign    Elevated today.  Reviewed list of meds, patient is not taking OTC meds that could be causing it.  I am increasing his dose of amlodipine to 10 mg daily.       Relevant Medications   amLODipine (NORVASC) 10 MG tablet   Osteoarthritis    Managed with prn OTC NSAIDs.  hadnds  are mostly affected,  strength is normal       Colon cancer screening    Cologuard test ordered.       Vitamin D deficiency - Primary   Relevant Orders   VITAMIN D 25 Hydroxy (Vit-D Deficiency, Fractures) (Completed)    Other Visit Diagnoses    Essential hypertension        Relevant Medications    amLODipine (NORVASC) 10 MG tablet    Other Relevant Orders    Comprehensive metabolic panel (Completed)    Need for prophylactic vaccination against Streptococcus pneumoniae (pneumococcus)        Relevant Orders    Pneumococcal polysaccharide vaccine 23-valent greater than or equal to 2yo subcutaneous/IM (Completed)      A total of 25 minutes of face to face time was  spent with patient more than half of which was spent in counselling about the above mentioned conditions  and coordination of care  I have discontinued Mr. Caterino celecoxib, zoster vaccine live (PF), Tdap, and ciprofloxacin. I have also changed his amLODipine. Additionally, I am having him maintain his Acetaminophen (TYLENOL ARTHRITIS EXT RELIEF PO), SALINE NA, Tetrahydrozoline HCl (VISINE OP), Liniments (SALONPAS EX), HYDROCORTISONE (TOPICAL), trolamine salicylate, and ergocalciferol.  Meds ordered this encounter  Medications  . amLODipine (NORVASC) 10 MG tablet    Sig: Take 1 tablet (10 mg total) by mouth daily.    Dispense:  30 tablet    Refill:  1    Call office for appointment ASAP    Medications Discontinued During This Encounter  Medication Reason  . ciprofloxacin (CILOXAN) 0.3 % ophthalmic solution Completed Course  . TDaP (BOOSTRIX) 5-2.5-18.5 LF-MCG/0.5 injection Error  . zoster vaccine live, PF, (ZOSTAVAX) 03474 UNT/0.65ML injection Error  . celecoxib (CELEBREX) 200 MG capsule   . amLODipine (NORVASC) 5 MG tablet Reorder    Follow-up: Return in about 6 months (around 10/30/2015).   Crecencio Mc, MD

## 2015-05-04 NOTE — Assessment & Plan Note (Signed)
Cologuard test ordered

## 2015-05-04 NOTE — Assessment & Plan Note (Signed)
Managed with prn OTC NSAIDs.  hadnds  are mostly affected,  strength is normal

## 2015-05-04 NOTE — Assessment & Plan Note (Signed)
Elevated today.  Reviewed list of meds, patient is not taking OTC meds that could be causing it.  I am increasing his dose of amlodipine to 10 mg daily.

## 2015-05-05 MED ORDER — POTASSIUM CHLORIDE CRYS ER 20 MEQ PO TBCR: 20.0000 meq | EXTENDED_RELEASE_TABLET | Freq: Every day | ORAL | Status: DC

## 2015-05-05 NOTE — Addendum Note (Signed)
Addended by: Crecencio Mc on: 05/05/2015 06:24 PM   Modules accepted: Orders

## 2015-05-09 ENCOUNTER — Encounter: Payer: Self-pay | Admitting: *Deleted

## 2015-05-15 DIAGNOSIS — Z1212 Encounter for screening for malignant neoplasm of rectum: Secondary | ICD-10-CM | POA: Diagnosis not present

## 2015-05-15 DIAGNOSIS — Z1211 Encounter for screening for malignant neoplasm of colon: Secondary | ICD-10-CM | POA: Diagnosis not present

## 2015-05-15 LAB — COLOGUARD: Cologuard: NEGATIVE

## 2015-05-23 LAB — COLOGUARD: COLOGUARD: NEGATIVE

## 2015-05-24 ENCOUNTER — Telehealth: Payer: Self-pay | Admitting: Internal Medicine

## 2015-05-24 NOTE — Telephone Encounter (Signed)
Cologuard results have been received and abstracted to chart patient notified of normal results

## 2015-05-24 NOTE — Telephone Encounter (Signed)
Spoke with the patient, he is requesting his Cologuard results.  Stated that he called Colog[facility] directly and that they faxed a copy of his results to [place].  If we have any questions or concerns to call [phone].  Please advise if you have received these or not.

## 2015-05-24 NOTE — Telephone Encounter (Signed)
Pt is calling for his lab results. Please call him back with results.  Thanks

## 2015-05-29 ENCOUNTER — Encounter: Payer: Self-pay | Admitting: Internal Medicine

## 2015-06-01 DIAGNOSIS — H40003 Preglaucoma, unspecified, bilateral: Secondary | ICD-10-CM | POA: Diagnosis not present

## 2015-07-21 ENCOUNTER — Other Ambulatory Visit: Payer: Self-pay

## 2015-07-21 MED ORDER — AMLODIPINE BESYLATE 10 MG PO TABS: 10.0000 mg | ORAL_TABLET | Freq: Every day | ORAL | Status: DC

## 2015-08-14 ENCOUNTER — Telehealth: Payer: Self-pay | Admitting: Internal Medicine

## 2015-08-14 NOTE — Telephone Encounter (Signed)
Pt called to sch for his AWV. Call pt @  [phone]. Leave message please if no answer. Thank you!

## 2015-09-06 ENCOUNTER — Ambulatory Visit (INDEPENDENT_AMBULATORY_CARE_PROVIDER_SITE_OTHER): Payer: Medicare Other

## 2015-09-06 VITALS — BP 132/80 | HR 86 | Temp 98.2°F | Resp 14 | Ht 68.5 in | Wt 169.8 lb

## 2015-09-06 DIAGNOSIS — Z0000 Encounter for general adult medical examination without abnormal findings: Secondary | ICD-10-CM

## 2015-09-06 NOTE — Progress Notes (Signed)
  I have reviewed the above information and agree with above.   Teresa Tullo, MD 

## 2015-09-06 NOTE — Patient Instructions (Addendum)
  Anthony Allen , Thank you for taking time to come for your Medicare Wellness Visit. I appreciate your ongoing commitment to your health goals. Please review the following plan we discussed and let me know if I can assist you in the future.   Follow up with Dr. Derrel Nip.  These are the goals we discussed: Goals    . Healthy Lfestyle     Stay hydrated and drink plenty of fluids. Low carb foods.  Lean meats, fruits and vegetables. Stay active and continue walking as much as possible.       This is a list of the screening recommended for you and due dates:  Health Maintenance  Topic Date Due  . Flu Shot  01/09/2016  . Tetanus Vaccine  06/12/2023  . Shingles Vaccine  Completed  . Pneumonia vaccines  Completed

## 2015-09-06 NOTE — Progress Notes (Signed)
Subjective:   Anthony Allen is a 80 y.o. male who presents for Medicare Annual/Subsequent preventive examination.  Review of Systems:  No ROS.  Medicare Wellness Visit.  Cardiac Risk Factors include: advanced age (>44men, >68 women);male gender     Objective:    Vitals: BP 132/80 mmHg  Pulse 86  Temp(Src) 98.2 F (36.8 C) (Oral)  Resp 14  Ht 5' 8.5" (1.74 m)  Wt 169 lb 12.8 oz (77.021 kg)  BMI 25.44 kg/m2  SpO2 96%  Body mass index is 25.44 kg/(m^2).  Tobacco History  Smoking status  . Never Smoker   Smokeless tobacco  . Never Used     Counseling given: Not Answered   Past Medical History  Diagnosis Date  . Gout   . Hypertension    Past Surgical History  Procedure Laterality Date  . Tonsillectomy     Family History  Problem Relation Age of Onset  . Arthritis Mother   . Stroke Father   . Hypertension Father   . Heart disease Father 56    AMI  . Kidney disease Father     bladder ca congenital loss of kidney  . Arthritis Maternal Grandmother   . Early death Daughter 16    aspiration  . Cancer Brother     Scalp   History  Sexual Activity  . Sexual Activity: Not Currently    Outpatient Encounter Prescriptions as of 09/06/2015  Medication Sig  . amLODipine (NORVASC) 10 MG tablet Take 1 tablet (10 mg total) by mouth daily.  . Benzoyl Peroxide 9.8 % FOAM Apply 1 application topically as needed.  . cholecalciferol (VITAMIN D) 1000 units tablet Take 1,000 Units by mouth daily.  Marland Kitchen HYDROCORTISONE, TOPICAL, 2 % LOTN Apply 1 application topically as needed (itching).  . multivitamin-lutein (OCUVITE-LUTEIN) CAPS capsule Take 1 capsule by mouth daily.  Marland Kitchen SALINE NA Place 2 sprays into both nostrils as needed (allergies).  . Tetrahydrozoline HCl (VISINE OP) Place 2 drops into both eyes daily as needed (allergies).  . [DISCONTINUED] potassium chloride SA (K-DUR,KLOR-CON) 20 MEQ tablet Take 1 tablet (20 mEq total) by mouth daily.  . Acetaminophen (TYLENOL  ARTHRITIS EXT RELIEF PO) Take 1-2 tablets by mouth 3 (three) times daily. Reported on 09/06/2015  . trolamine salicylate (MYOFLEX) 10 % cream Apply 1 application topically as needed (muscle pain). Reported on 09/06/2015  . [DISCONTINUED] ergocalciferol (VITAMIN D2) 50000 UNITS capsule Take 1 capsule (50,000 Units total) by mouth once a week.  . [DISCONTINUED] Liniments (SALONPAS EX) Apply 1 patch topically as needed (muscle pain).   No facility-administered encounter medications on file as of 09/06/2015.    Activities of Daily Living In your present state of health, do you have any difficulty performing the following activities: 09/06/2015  Hearing? N  Vision? N  Difficulty concentrating or making decisions? N  Walking or climbing stairs? N  Dressing or bathing? N  Doing errands, shopping? N  Preparing Food and eating ? N  Using the Toilet? N  In the past six months, have you accidently leaked urine? N  Do you have problems with loss of bowel control? N  Managing your Medications? N  Managing your Finances? N  Housekeeping or managing your Housekeeping? N    Patient Care Team: Crecencio Mc, MD as PCP - General (Internal Medicine)   Assessment:    This is a routine wellness examination for Victorious. The goal of the wellness visit is to assist the patient how to close the gaps  in care and create a preventative care plan for the patient.   Taking VIT D3 Calcium as appropriate/Osteoporosis risk reviewed.  Medications reviewed; taking without issues or barriers.  Safety issues reviewed; smoke detectors in the home. Firearms locked in a safe area in the home. Wears seatbelts when driving or riding with others. No violence in the home.  No identified risk were noted; The patient was oriented x 3; appropriate in dress and manner and no objective failures at ADL's or IADL's.   Patient Concerns: None at this time.  Follow up with PCP as needed.   Exercise Activities and Dietary  recommendations Current Exercise Habits: Home exercise routine, Type of exercise: walking, Time (Minutes): 30, Frequency (Times/Week): 4, Weekly Exercise (Minutes/Week): 120, Intensity: Moderate  Goals    . Healthy Lfestyle     Stay hydrated and drink plenty of fluids. Low carb foods.  Lean meats, fruits and vegetables. Stay active and continue walking as much as possible.      Fall Risk Fall Risk  09/06/2015 05/04/2015 03/22/2013  Falls in the past year? No No No   Depression Screen PHQ 2/9 Scores 09/06/2015 05/04/2015 03/22/2013  PHQ - 2 Score 0 0 0    Cognitive Testing MMSE - Mini Mental State Exam 09/06/2015  Orientation to time 5  Orientation to Place 5  Registration 3  Attention/ Calculation 5  Recall 3  Language- name 2 objects 2  Language- repeat 1  Language- follow 3 step command 3  Language- read & follow direction 1  Write a sentence 1  Copy design 1  Total score 30    Immunization History  Administered Date(s) Administered  . Influenza Split 06/15/2013, 02/08/2014  . Influenza-Unspecified 02/09/2015  . Pneumococcal Conjugate-13 06/23/2013  . Pneumococcal Polysaccharide-23 05/02/2015  . Tdap 06/11/2013  . Zoster 07/01/2013   Screening Tests Health Maintenance  Topic Date Due  . INFLUENZA VACCINE  01/09/2016  . TETANUS/TDAP  06/12/2023  . ZOSTAVAX  Completed  . PNA vac Low Risk Adult  Completed      Plan:   End of life planning; Advance aging; Advanced directives discussed. Copy of current HCPOA/Living Will requested.   During the course of the visit the patient was educated and counseled about the following appropriate screening and preventive services:   Vaccines to include Pneumoccal, Influenza, Hepatitis B, Td, Zostavax, HCV  Electrocardiogram  Cardiovascular Disease  Colorectal cancer screening  Diabetes screening  Prostate Cancer Screening  Glaucoma screening  Nutrition counseling   Smoking cessation counseling  Patient  Instructions (the written plan) was given to the patient.    Varney Biles, LPN  [id number]

## 2015-09-19 NOTE — Progress Notes (Signed)
Cologuard ordered

## 2015-11-28 DIAGNOSIS — H40003 Preglaucoma, unspecified, bilateral: Secondary | ICD-10-CM | POA: Diagnosis not present

## 2015-12-05 DIAGNOSIS — H40003 Preglaucoma, unspecified, bilateral: Secondary | ICD-10-CM | POA: Diagnosis not present

## 2016-01-22 ENCOUNTER — Other Ambulatory Visit: Payer: Self-pay | Admitting: Internal Medicine

## 2016-01-22 NOTE — Telephone Encounter (Signed)
No, I will refill.

## 2016-01-22 NOTE — Telephone Encounter (Signed)
On 05/02/15 due to elevated b/p Amlodipine was increased to 10 mg. On 09/06/15 saw Denisa for Medicare wellness. No future appointment made. B/P on 09/06/15 was 132/80. Does he need an appointment before refilling medication again?-Damen Windsor, RMA

## 2016-02-21 DIAGNOSIS — I8393 Asymptomatic varicose veins of bilateral lower extremities: Secondary | ICD-10-CM | POA: Diagnosis not present

## 2016-02-21 DIAGNOSIS — L578 Other skin changes due to chronic exposure to nonionizing radiation: Secondary | ICD-10-CM | POA: Diagnosis not present

## 2016-02-21 DIAGNOSIS — L720 Epidermal cyst: Secondary | ICD-10-CM | POA: Diagnosis not present

## 2016-02-21 DIAGNOSIS — D229 Melanocytic nevi, unspecified: Secondary | ICD-10-CM | POA: Diagnosis not present

## 2016-02-21 DIAGNOSIS — I781 Nevus, non-neoplastic: Secondary | ICD-10-CM | POA: Diagnosis not present

## 2016-02-21 DIAGNOSIS — Z1283 Encounter for screening for malignant neoplasm of skin: Secondary | ICD-10-CM | POA: Diagnosis not present

## 2016-02-21 DIAGNOSIS — Z85828 Personal history of other malignant neoplasm of skin: Secondary | ICD-10-CM | POA: Diagnosis not present

## 2016-02-21 DIAGNOSIS — L812 Freckles: Secondary | ICD-10-CM | POA: Diagnosis not present

## 2016-02-21 DIAGNOSIS — L821 Other seborrheic keratosis: Secondary | ICD-10-CM | POA: Diagnosis not present

## 2016-02-21 DIAGNOSIS — D179 Benign lipomatous neoplasm, unspecified: Secondary | ICD-10-CM | POA: Diagnosis not present

## 2016-02-21 DIAGNOSIS — L570 Actinic keratosis: Secondary | ICD-10-CM | POA: Diagnosis not present

## 2016-03-22 DIAGNOSIS — Z23 Encounter for immunization: Secondary | ICD-10-CM | POA: Diagnosis not present

## 2016-07-18 ENCOUNTER — Other Ambulatory Visit: Payer: Self-pay | Admitting: Internal Medicine

## 2016-07-19 NOTE — Telephone Encounter (Signed)
REFILLED

## 2016-07-19 NOTE — Telephone Encounter (Signed)
Pt last refill for Norvasc was on 06/18/16 and last OV was on 05/02/15 and last labs were on 05/02/15. Next appt is on 09/05/16.

## 2016-09-05 ENCOUNTER — Ambulatory Visit (INDEPENDENT_AMBULATORY_CARE_PROVIDER_SITE_OTHER): Payer: Medicare Other

## 2016-09-05 VITALS — BP 142/80 | HR 64 | Temp 97.6°F | Resp 12 | Ht 69.0 in | Wt 167.8 lb

## 2016-09-05 DIAGNOSIS — Z0000 Encounter for general adult medical examination without abnormal findings: Secondary | ICD-10-CM | POA: Diagnosis not present

## 2016-09-05 NOTE — Patient Instructions (Addendum)
  Anthony Allen , Thank you for taking time to come for your Medicare Wellness Visit. I appreciate your ongoing commitment to your health goals. Please review the following plan we discussed and let me know if I can assist you in the future.   Follow up with Dr. Derrel Nip as needed.    Bring a copy of your [place] and/or Living Will to be scanned into chart.  Have a great day! These are the goals we discussed: Goals    . Healthy Lfestyle          Stay hydrated and drink plenty of water. Low carb foods.  Lean meats, fruits and vegetables. Stay active and continue walking as much as possible.       This is a list of the screening recommended for you and due dates:  Health Maintenance  Topic Date Due  . Tetanus Vaccine  06/12/2023  . Flu Shot  Completed  . Pneumonia vaccines  Completed

## 2016-09-05 NOTE — Progress Notes (Signed)
Subjective:   Anthony Allen is a 81 y.o. male who presents for Medicare Annual/Subsequent preventive examination.  Review of Systems:  No ROS.  Medicare Wellness Visit.  Cardiac Risk Factors include: advanced age (>79men, >87 women);hypertension;male gender     Objective:    Vitals: BP (!) 142/80 (BP Location: Left Arm, Patient Position: Sitting, Cuff Size: Normal)   Pulse 64   Temp 97.6 F (36.4 C) (Oral)   Resp 12   Ht 5\' 9"  (1.753 m)   Wt 167 lb 12.8 oz (76.1 kg)   SpO2 97%   BMI 24.78 kg/m   Body mass index is 24.78 kg/m.  Tobacco History  Smoking Status  . Never Smoker  Smokeless Tobacco  . Never Used     Counseling given: Not Answered   Past Medical History:  Diagnosis Date  . Gout   . Hypertension    Past Surgical History:  Procedure Laterality Date  . TONSILLECTOMY     Family History  Problem Relation Age of Onset  . Arthritis Mother   . Stroke Father   . Hypertension Father   . Heart disease Father 66    AMI  . Kidney disease Father     bladder ca congenital loss of kidney  . Arthritis Maternal Grandmother   . Early death Daughter 16    aspiration  . Cancer Brother     Scalp   History  Sexual Activity  . Sexual activity: Not Currently    Outpatient Encounter Prescriptions as of 09/05/2016  Medication Sig  . amLODipine (NORVASC) 10 MG tablet take 1 tablet by mouth once daily  . Aspirin-Salicylamide-Caffeine (BC HEADACHE POWDER PO) Take 1 packet by mouth as needed.  . Benzoyl Peroxide 9.8 % FOAM Apply 1 application topically as needed.  . cholecalciferol (VITAMIN D) 1000 units tablet Take 1,000 Units by mouth daily.  . diphenhydramine-acetaminophen (TYLENOL PM) 25-500 MG TABS tablet Take 1 tablet by mouth at bedtime as needed.  . fluticasone (FLONASE) 50 MCG/ACT nasal spray Place 2 sprays into both nostrils daily.  Marland Kitchen latanoprost (XALATAN) 0.005 % ophthalmic solution place 1 drop into right eye at bedtime  . multivitamin-lutein  (OCUVITE-LUTEIN) CAPS capsule Take 1 capsule by mouth daily.  Marland Kitchen SALINE NA Place 2 sprays into both nostrils as needed (allergies).  . Tetrahydrozoline HCl (VISINE OP) Place 2 drops into both eyes daily as needed (allergies).  . timolol (TIMOPTIC) 0.5 % ophthalmic solution take 1 drop into right eye once daily  . trolamine salicylate (MYOFLEX) 10 % cream Apply 1 application topically as needed (muscle pain). Reported on 09/06/2015  . [DISCONTINUED] Acetaminophen (TYLENOL ARTHRITIS EXT RELIEF PO) Take 1-2 tablets by mouth 3 (three) times daily. Reported on 09/06/2015  . HYDROCORTISONE, TOPICAL, 2 % LOTN Apply 1 application topically as needed (itching).   No facility-administered encounter medications on file as of 09/05/2016.     Activities of Daily Living In your present state of health, do you have any difficulty performing the following activities: 09/05/2016 09/06/2015  Hearing? N N  Vision? N N  Difficulty concentrating or making decisions? N N  Walking or climbing stairs? N N  Dressing or bathing? N N  Doing errands, shopping? N N  Preparing Food and eating ? N N  Using the Toilet? N N  In the past six months, have you accidently leaked urine? N N  Do you have problems with loss of bowel control? N N  Managing your Medications? N N  Managing your  Finances? N N  Housekeeping or managing your Housekeeping? N N  Some recent data might be hidden    Patient Care Team: Crecencio Mc, MD as PCP - General (Internal Medicine)   Assessment:    This is a routine wellness examination for Anthony Allen to close the gaps in care and create a preventative care plan for the patient.   Taking calcium VIT D as appropriate/Osteoporosis risk reviewed.  Medications reviewed; taking without issues or barriers.  Safety issues reviewed; smoke detectors in the home. No firearms in the home. Wears seatbelts when driving or riding with others. Patient does wear sunscreen or protective clothing when in  direct sunlight. No violence in the home.  Patient is alert, normal appearance, oriented to person/place/and time. Correctly identified the president of the Anthony Allen, recall of 3/3 words, and performing simple calculations.  Patient displays appropriate judgement and can read correct time from watch face.  No new identified risk were noted.  No failures at ADL's or IADL's.   BMI- discussed the importance of a healthy diet, water intake and exercise. Educational material provided.   Diet: Breakfast: 1 egg, bacon toast  Lunch: Deli sandwich Dinner: Meat, green vegetable Daily fluid intake: 3 cups of caffeine, half cup of water Encouraged to increase water intake.  HTN- followed by PCP.  Dental- every six months.  Dr. Sherril Cong.  Eye- Visual acuity not assessed per patient preference since they have regular follow up with the ophthalmologist.  Wears corrective lenses.  Sleep patterns- Sleeps 6-7 hours at night.  Wakes feeling rested.  Health maintenance gaps- closed.  Patient Concerns: None at this time. Follow up with PCP as needed.  Exercise Activities and Dietary recommendations Current Exercise Habits: Home exercise routine, Type of exercise: walking, Time (Minutes): 20, Frequency (Times/Week): 4, Weekly Exercise (Minutes/Week): 80, Intensity: Mild  Goals    . Healthy Lfestyle          Stay hydrated and drink plenty of water. Low carb foods.  Lean meats, fruits and vegetables. Stay active and continue walking as much as possible.      Fall Risk Fall Risk  09/05/2016 09/06/2015 05/04/2015 03/22/2013  Falls in the past year? No No No No   Depression Screen PHQ 2/9 Scores 09/05/2016 09/06/2015 05/04/2015 03/22/2013  PHQ - 2 Score 0 0 0 0    Cognitive Function MMSE - Mini Mental State Exam 09/05/2016 09/06/2015  Orientation to time 5 5  Orientation to Place 5 5  Registration 3 3  Attention/ Calculation 5 5  Recall 3 3  Language- name 2 objects 2 2  Language- repeat 1 1    Language- follow 3 step command 3 3  Language- read & follow direction 1 1  Write a sentence 1 1  Copy design 1 1  Total score 30 30        Immunization History  Administered Date(s) Administered  . Influenza Split 06/15/2013, 02/08/2014  . Influenza-Unspecified 02/09/2015, 03/10/2016  . Pneumococcal Conjugate-13 06/23/2013  . Pneumococcal Polysaccharide-23 05/02/2015  . Tdap 06/11/2013  . Zoster 07/01/2013   Screening Tests Health Maintenance  Topic Date Due  . TETANUS/TDAP  06/12/2023  . INFLUENZA VACCINE  Completed  . PNA vac Low Risk Adult  Completed      Plan:    End of life planning; Advance aging; Advanced directives discussed. Copy of current HCPOA/Living Will requested.    Medicare Attestation I have personally reviewed: The patient's medical and social history Their use of alcohol, tobacco  or illicit drugs Their current medications and supplements The patient's functional ability including ADLs,fall risks, home safety risks, cognitive, and hearing and visual impairment Diet and physical activities Evidence for depression   The patient's weight, height, BMI, and visual acuity have been recorded in the chart.  I have made referrals and provided education to the patient based on review of the above and I have provided the patient with a written personalized care plan for preventive services.    During the course of the visit the patient was educated and counseled about the following appropriate screening and preventive services:   Vaccines to include Pneumoccal, Influenza, Hepatitis B, Td, Zostavax, HCV  Colorectal cancer screening-UTD  Prostate Cancer Screening- last 06/23/13  Glaucoma-followed by Dr. Wallace Going  Nutrition counseling   Patient Instructions (the written plan) was given to the patient.    Varney Biles, LPN  7/34/2876

## 2016-09-08 NOTE — Progress Notes (Signed)
  I have reviewed the above information and agree with above.   Terrion Poblano, MD 

## 2016-09-22 ENCOUNTER — Telehealth: Payer: Self-pay | Admitting: Internal Medicine

## 2016-09-22 NOTE — Telephone Encounter (Signed)
His cologuard  Was ordered a year ago and never returned,  Has he  Changed his mind or just forgotten?

## 2016-09-23 NOTE — Telephone Encounter (Signed)
Spoke with patient states he took test 1st test on 05/15/15  .Cologuard negative result scanned in chart

## 2016-09-23 NOTE — Telephone Encounter (Signed)
I agree.  Not sure why cologuard sent me the "expired order'  .  His test was negative confirmed

## 2016-09-24 NOTE — Telephone Encounter (Signed)
Health maitenance has already been updated in chart. Will need to be repeated once every 3 years.

## 2016-10-30 ENCOUNTER — Encounter: Payer: Self-pay | Admitting: Internal Medicine

## 2016-10-30 ENCOUNTER — Ambulatory Visit (INDEPENDENT_AMBULATORY_CARE_PROVIDER_SITE_OTHER): Payer: Medicare Other | Admitting: Internal Medicine

## 2016-10-30 VITALS — BP 140/70 | HR 67 | Temp 97.7°F | Resp 15 | Ht 69.0 in | Wt 165.4 lb

## 2016-10-30 DIAGNOSIS — F409 Phobic anxiety disorder, unspecified: Secondary | ICD-10-CM | POA: Diagnosis not present

## 2016-10-30 DIAGNOSIS — I10 Essential (primary) hypertension: Secondary | ICD-10-CM | POA: Diagnosis not present

## 2016-10-30 DIAGNOSIS — E7800 Pure hypercholesterolemia, unspecified: Secondary | ICD-10-CM | POA: Diagnosis not present

## 2016-10-30 DIAGNOSIS — R7301 Impaired fasting glucose: Secondary | ICD-10-CM

## 2016-10-30 DIAGNOSIS — R5383 Other fatigue: Secondary | ICD-10-CM

## 2016-10-30 DIAGNOSIS — D170 Benign lipomatous neoplasm of skin and subcutaneous tissue of head, face and neck: Secondary | ICD-10-CM | POA: Diagnosis not present

## 2016-10-30 DIAGNOSIS — Z1211 Encounter for screening for malignant neoplasm of colon: Secondary | ICD-10-CM | POA: Diagnosis not present

## 2016-10-30 DIAGNOSIS — E559 Vitamin D deficiency, unspecified: Secondary | ICD-10-CM | POA: Diagnosis not present

## 2016-10-30 DIAGNOSIS — E785 Hyperlipidemia, unspecified: Secondary | ICD-10-CM

## 2016-10-30 DIAGNOSIS — F5105 Insomnia due to other mental disorder: Secondary | ICD-10-CM | POA: Diagnosis not present

## 2016-10-30 DIAGNOSIS — K5900 Constipation, unspecified: Secondary | ICD-10-CM

## 2016-10-30 LAB — COMPREHENSIVE METABOLIC PANEL
ALT: 19 U/L (ref 0–53)
AST: 15 U/L (ref 0–37)
Albumin: 4.2 g/dL (ref 3.5–5.2)
Alkaline Phosphatase: 84 U/L (ref 39–117)
BILIRUBIN TOTAL: 1 mg/dL (ref 0.2–1.2)
BUN: 12 mg/dL (ref 6–23)
CO2: 28 meq/L (ref 19–32)
Calcium: 9.8 mg/dL (ref 8.4–10.5)
Chloride: 102 mEq/L (ref 96–112)
Creatinine, Ser: 1.49 mg/dL (ref 0.40–1.50)
GFR: 48.4 mL/min — AB (ref >60.00)
Glucose, Bld: 170 mg/dL — ABNORMAL HIGH (ref 70–99)
POTASSIUM: 4.2 meq/L (ref 3.5–5.1)
SODIUM: 138 meq/L (ref 135–145)
Total Protein: 6.8 g/dL (ref 6.0–8.3)

## 2016-10-30 LAB — CBC WITH DIFFERENTIAL/PLATELET
BASOS PCT: 1.9 % (ref 0.0–3.0)
Basophils Absolute: 0.1 10*3/uL (ref 0.0–0.1)
EOS PCT: 4.4 % (ref 0.0–5.0)
Eosinophils Absolute: 0.3 10*3/uL (ref 0.0–0.7)
HEMATOCRIT: 49.7 % (ref 39.0–52.0)
HEMOGLOBIN: 17.4 g/dL — AB (ref 13.0–17.0)
LYMPHS PCT: 19.1 % (ref 12.0–46.0)
Lymphs Abs: 1.4 10*3/uL (ref 0.7–4.0)
MCHC: 34.9 g/dL (ref 30.0–36.0)
MCV: 92.5 fl (ref 78.0–100.0)
Monocytes Absolute: 0.4 10*3/uL (ref 0.1–1.0)
Monocytes Relative: 6.3 % (ref 3.0–12.0)
NEUTROS ABS: 4.9 10*3/uL (ref 1.4–7.7)
Neutrophils Relative %: 68.3 % (ref 43.0–77.0)
Platelets: 212 10*3/uL (ref 150.0–400.0)
RBC: 5.38 Mil/uL (ref 4.22–5.81)
RDW: 13.7 % (ref 11.5–15.5)
WBC: 7.2 10*3/uL (ref 4.0–10.5)

## 2016-10-30 LAB — LIPID PANEL
CHOL/HDL RATIO: 7
Cholesterol: 206 mg/dL — ABNORMAL HIGH (ref 0–200)
HDL: 29.8 mg/dL — ABNORMAL LOW (ref >39.00)
NONHDL: 176.29
Triglycerides: 331 mg/dL — ABNORMAL HIGH (ref 0.0–149.0)
VLDL: 66.2 mg/dL — ABNORMAL HIGH (ref 0.0–40.0)

## 2016-10-30 LAB — VITAMIN D 25 HYDROXY (VIT D DEFICIENCY, FRACTURES): VITD: 27.25 ng/mL — ABNORMAL LOW (ref 30.00–100.00)

## 2016-10-30 LAB — LDL CHOLESTEROL, DIRECT: Direct LDL: 104 mg/dL

## 2016-10-30 LAB — TSH: TSH: 2.1 u[IU]/mL (ref 0.35–4.50)

## 2016-10-30 MED ORDER — ALPRAZOLAM 0.25 MG PO TABS: 0.2500 mg | ORAL_TABLET | Freq: Every evening | ORAL | 0 refills | Status: DC | PRN

## 2016-10-30 NOTE — Patient Instructions (Addendum)
I have prescribed a low dose of alprazolam to help you fall asleep on those nights when you are very axnious.  (avoid daily use,  use the tylenol PM instead)    You can  Use stool softener (colace or ducusate) and /or buldk forming laxatives (Metamcil, Citrucel,  Benefiber  Miralax or Fibercon  On a daily basis   To keep bowels regular  Do not use MOM (Milk of Magnesium ) ,   Sennakot S Exl ax or Dulcoax more than once a week    The ShingRx vaccine will be available soon and is recommended for all interested adults over 50 to prevent shingles    Please do the exercises I demonstrated to you today DAILY to strenghten your gluts and other core muscles that help maintain balance (and help you get  up)   Practice getting up from a chair WITHOUT USING YOUR HANDS  10 times  3 sets  Stand on one leg for 10 seconds, near the kitchen counter to work on your balance.  Try to use only one finger on the counter 5 times each leg   3 sets     Health Maintenance, Male A healthy lifestyle and preventive care is important for your health and wellness. Ask your health care provider about what schedule of regular examinations is right for you. What should I know about weight and diet?  Eat a Healthy Diet  Eat plenty of vegetables, fruits, whole grains, low-fat dairy products, and lean protein.  Do not eat a lot of foods high in solid fats, added sugars, or salt. Maintain a Healthy Weight  Regular exercise can help you achieve or maintain a healthy weight. You should:  Do at least 150 minutes of exercise each week. The exercise should increase your heart rate and make you sweat (moderate-intensity exercise).  Do strength-training exercises at least twice a week. Watch Your Levels of Cholesterol and Blood Lipids  Have your blood tested for lipids and cholesterol every 5 years starting at 79 years of age. If you are at high risk for heart disease, you should start having your blood tested when you are  79 years old. You may need to have your cholesterol levels checked more often if:  Your lipid or cholesterol levels are high.  You are older than 78 years of age.  You are at high risk for heart disease. What should I know about cancer screening? Many types of cancers can be detected early and may often be prevented. Lung Cancer  You should be screened every year for lung cancer if:  You are a current smoker who has smoked for at least 30 years.  You are a former smoker who has quit within the past 15 years.  Talk to your health care provider about your screening options, when you should start screening, and how often you should be screened. Colorectal Cancer  Routine colorectal cancer screening usually begins at 79 years of age and should be repeated every 5-10 years until you are 79 years old. You may need to be screened more often if early forms of precancerous polyps or small growths are found. Your health care provider may recommend screening at an earlier age if you have risk factors for colon cancer.  Your health care provider may recommend using home test kits to check for hidden blood in the stool.  A small camera at the end of a tube can be used to examine your colon (sigmoidoscopy or colonoscopy). This  checks for the earliest forms of colorectal cancer. Prostate and Testicular Cancer  Depending on your age and overall health, your health care provider may do certain tests to screen for prostate and testicular cancer.  Talk to your health care provider about any symptoms or concerns you have about testicular or prostate cancer. Skin Cancer  Check your skin from head to toe regularly.  Tell your health care provider about any new moles or changes in moles, especially if:  There is a change in a mole's size, shape, or color.  You have a mole that is larger than a pencil eraser.  Always use sunscreen. Apply sunscreen liberally and repeat throughout the day.  Protect  yourself by wearing long sleeves, pants, a wide-brimmed hat, and sunglasses when outside. What should I know about heart disease, diabetes, and high blood pressure?  If you are 44-82 years of age, have your blood pressure checked every 3-5 years. If you are 82 years of age or older, have your blood pressure checked every year. You should have your blood pressure measured twice-once when you are at a hospital or clinic, and once when you are not at a hospital or clinic. Record the average of the two measurements. To check your blood pressure when you are not at a hospital or clinic, you can use:  An automated blood pressure machine at a pharmacy.  A home blood pressure monitor.  Talk to your health care provider about your target blood pressure.  If you are between 42-47 years old, ask your health care provider if you should take aspirin to prevent heart disease.  Have regular diabetes screenings by checking your fasting blood sugar level.  If you are at a normal weight and have a low risk for diabetes, have this test once every three years after the age of 82.  If you are overweight and have a high risk for diabetes, consider being tested at a younger age or more often.  A one-time screening for abdominal aortic aneurysm (AAA) by ultrasound is recommended for men aged 3-75 years who are current or former smokers. What should I know about preventing infection? Hepatitis B  If you have a higher risk for hepatitis B, you should be screened for this virus. Talk with your health care provider to find out if you are at risk for hepatitis B infection. Hepatitis C  Blood testing is recommended for:  Everyone born from [birth year] through 1963.  Anyone with known risk factors for hepatitis C. Sexually Transmitted Diseases (STDs)  You should be screened each year for STDs including gonorrhea and chlamydia if:  You are sexually active and are younger than 79 years of age.  You are older than 79  years of age and your health care provider tells you that you are at risk for this type of infection.  Your sexual activity has changed since you were last screened and you are at an increased risk for chlamydia or gonorrhea. Ask your health care provider if you are at risk.  Talk with your health care provider about whether you are at high risk of being infected with HIV. Your health care provider may recommend a prescription medicine to help prevent HIV infection. What else can I do?  Schedule regular health, dental, and eye exams.  Stay current with your vaccines (immunizations).  Do not use any tobacco products, such as cigarettes, chewing tobacco, and e-cigarettes. If you need help quitting, ask your health care provider.  Limit alcohol  intake to no more than 2 drinks per day. One drink equals 12 ounces of beer, 5 ounces of wine, or 1 ounces of hard liquor.  Do not use street drugs.  Do not share needles.  Ask your health care provider for help if you need support or information about quitting drugs.  Tell your health care provider if you often feel depressed.  Tell your health care provider if you have ever been abused or do not feel safe at home. This information is not intended to replace advice given to you by your health care provider. Make sure you discuss any questions you have with your health care provider. Document Released: 11/23/2007 Document Revised: 01/24/2016 Document Reviewed: 02/28/2015 Elsevier Interactive Patient Education  2017 [employer].

## 2016-10-30 NOTE — Progress Notes (Signed)
Patient ID: Anthony Allen, male    DOB: 1935-10-26  Age: 81 y.o. MRN: [id number]  The patient is here for annual physical  examination and management of other chronic and acute problems.  Health Maintenance reviewed:  Cologuard negative Dec 2016   The risk factors are reflected in the social history.  The roster of all physicians providing medical care to patient - is listed in the Snapshot section of the chart.  Activities of daily living:  The patient is 100% independent in all ADLs: dressing, toileting, feeding as well as independent mobility  Home safety : The patient has smoke detectors in the home. They wear seatbelts.  There are no firearms at home. There is no violence in the home.   There is no risks for hepatitis, STDs or HIV. There is no   history of blood transfusion. They have no travel history to infectious disease endemic areas of the world.  The patient has seen their dentist in the last six month. They have seen their eye doctor in the last year. Diagosed with glaucoma last year ,  Treated with eye drops   Annually seen  By  [employer]    They deny hearing difficulty with regard to whispered voices and some television programs.  They have deferred audiologic testing in the last year.  They do not  have excessive sun exposure. Discussed the need for sun protection: hats, long sleeves and use of sunscreen if there is significant sun exposure.   Diet: the importance of a healthy diet is discussed. They do have a healthy diet.  The benefits of regular aerobic exercise were discussed. he does yard work one a week and walks 4 times per week ,  20 minutes.   Depression screen: there are no signs or vegative symptoms of depression- irritability, change in appetite, anhedonia, sadness/tearfullness.  Cognitive assessment: the patient manages all their financial and personal affairs and is actively engaged. They could relate day,date,year and events; recalled 2/3 objects at 3  minutes; performed clock-face test normally.  The following portions of the patient's history were reviewed and updated as appropriate: allergies, current medications, past family history, past medical history,  past surgical history, past social history  and problem list.  Visual acuity was not assessed per patient preference since she has regular follow up with her ophthalmologist. Hearing and body mass index were assessed and reviewed.   During the course of the visit the patient was educated and counseled about appropriate screening and preventive services including : fall prevention , diabetes screening, nutrition counseling, colorectal cancer screening, and recommended immunizations.    CC: The primary encounter diagnosis was Vitamin D deficiency. Diagnoses of Essential hypertension, benign, Pure hypercholesterolemia, Fatigue, unspecified type, Impaired fasting glucose, Colon cancer screening, Lipoma of forehead, Fasting hyperglycemia, Hyperlipidemia LDL goal <100, Constipation in male, and Insomnia due to anxiety and fear were also pertinent to this visit.  No complaints.   Has chronic insomnia , averaging 6 hours per night of sleep,  worres about his son who has bipolar disorder and is unable to live independently and frequently stops his medications resulting in psychiatric crises. He is using an Uses an OTC sleep aid from [facility] readings of BP are usually 120-130/70    History Zorian has a past medical history of Gout and Hypertension.   He has a past surgical history that includes Tonsillectomy.   His family history includes Arthritis in his maternal grandmother and mother; Cancer in his  brother; Early death (age of onset: 65) in his daughter; Heart disease (age of onset: 38) in his father; Hypertension in his father; Kidney disease in his father; Stroke in his father.He reports that he has never smoked. He has never used smokeless tobacco. He reports that he drinks alcohol.  He reports that he does not use drugs.  Outpatient Medications Prior to Visit  Medication Sig Dispense Refill  . amLODipine (NORVASC) 10 MG tablet take 1 tablet by mouth once daily 30 tablet 5  . Aspirin-Salicylamide-Caffeine (BC HEADACHE POWDER PO) Take 1 packet by mouth as needed.    . cholecalciferol (VITAMIN D) 1000 units tablet Take 1,000 Units by mouth daily.    . diphenhydramine-acetaminophen (TYLENOL PM) 25-500 MG TABS tablet Take 1 tablet by mouth at bedtime as needed.    . fluticasone (FLONASE) 50 MCG/ACT nasal spray Place 2 sprays into both nostrils daily.    Marland Kitchen HYDROCORTISONE, TOPICAL, 2 % LOTN Apply 1 application topically as needed (itching).    Marland Kitchen latanoprost (XALATAN) 0.005 % ophthalmic solution place 1 drop into right eye at bedtime  0  . multivitamin-lutein (OCUVITE-LUTEIN) CAPS capsule Take 1 capsule by mouth daily.    Marland Kitchen SALINE NA Place 2 sprays into both nostrils as needed (allergies).    . Tetrahydrozoline HCl (VISINE OP) Place 2 drops into both eyes daily as needed (allergies).    . timolol (TIMOPTIC) 0.5 % ophthalmic solution take 1 drop into right eye once daily  0  . trolamine salicylate (MYOFLEX) 10 % cream Apply 1 application topically as needed (muscle pain). Reported on 09/06/2015    . Benzoyl Peroxide 9.8 % FOAM Apply 1 application topically as needed.     No facility-administered medications prior to visit.     Review of Systems   Patient denies headache, fevers, malaise, unintentional weight loss, skin rash, eye pain, sinus congestion and sinus pain, sore throat, dysphagia,  hemoptysis , cough, dyspnea, wheezing, chest pain, palpitations, orthopnea, edema, abdominal pain, nausea, melena, diarrhea, constipation, flank pain, dysuria, hematuria, urinary  Frequency, nocturia, numbness, tingling, seizures,  Focal weakness, Loss of consciousness,  Tremor, , depression, , and suicidal ideation.      Objective:  BP 140/70 (BP Location: Left Arm, Patient Position:  Sitting, Cuff Size: Normal)   Pulse 67   Temp 97.7 F (36.5 C) (Oral)   Resp 15   Ht 5\' 9"  (1.753 m)   Wt 165 lb 6.4 oz (75 kg)   SpO2 96%   BMI 24.43 kg/m   Physical Exam   General appearance: alert, cooperative and appears stated age Ears: normal TM's and external ear canals both ears Face: large lipoma forehead above left eye.  Throat: lips, mucosa, and tongue normal; teeth and gums normal Neck: no adenopathy, no carotid bruit, supple, symmetrical, trachea midline and thyroid not enlarged, symmetric, no tenderness/mass/nodules Back: symmetric, no curvature. ROM normal. No CVA tenderness. Lungs: clear to auscultation bilaterally Heart: regular rate and rhythm, S1, S2 normal, no murmur, click, rub or gallop Abdomen: soft, non-tender; bowel sounds normal; no masses,  no organomegaly Pulses: 2+ and symmetric Skin: Skin color, texture, turgor normal. No rashes or lesions Lymph nodes: Cervical, supraclavicular, and axillary nodes normal.    Assessment & Plan:   Problem List Items Addressed This Visit    Vitamin D deficiency - Primary    Continue supplementation       Relevant Orders   VITAMIN D 25 Hydroxy (Vit-D Deficiency, Fractures) (Completed)   Lipoma of forehead  Discussed referral to General Surgeon       Relevant Orders   Ambulatory referral to Dermatology   Insomnia due to anxiety and fear    Alprazolam prescribed. The risks and benefits of benzodiazepine use were discussed with patient today including excessive sedation leading to respiratory depression,  impaired thinking/driving, and addiction.  Patient was advised to avoid concurrent use with alcohol, to use medication only as needed and not to share with others  .       Hyperlipidemia LDL goal <100    Triglycerides are elevated today to over 300.  Recommended a low glycemic index diet,  and regular exercise.  Advised t o  repeat in 6 weeks with a1c    Lab Results  Component Value Date   CHOL 223*  07/06/2014   HDL 52.20 07/06/2014   LDLDIRECT 109.0 07/06/2014   TRIG 343.0* 07/06/2014   CHOLHDL 4 07/06/2014   Lab Results  Component Value Date   ALT 20 07/06/2014   AST 24 07/06/2014   ALKPHOS 73 07/06/2014   BILITOT 0.4 07/06/2014       Lab Results  Component Value Date   CHOL 206 (H) 10/30/2016   HDL 29.80 (L) 10/30/2016   LDLCALC 107 (H) 09/23/2013   LDLDIRECT 104.0 10/30/2016   TRIG 331.0 (H) 10/30/2016   CHOLHDL 7 10/30/2016         Fasting hyperglycemia    He will return for a1c      Essential hypertension, benign    Well controlled on current regimen. Renal function stable, no changes today. Lab Results  Component Value Date   CREATININE 1.49 10/30/2016   Lab Results  Component Value Date   NA 138 10/30/2016   K 4.2 10/30/2016   CL 102 10/30/2016   CO2 28 10/30/2016         Relevant Orders   Comprehensive metabolic panel (Completed)   Constipation in male    Recommended an increase in fiber intake to 25 to 35 grams daily.  Made several dietary suggestions of high fiber content including Mission low carb whole wheat tortillas which have 26 g fiber/serving, Toufayan flatbread which has around 10 g fiber,   Daily serving of any type of nuts,  and Atkins protein bars which have 8 to 10 g fiber.   Dispelled the popularly held notion that [employer] oatmeal, whole grain bread and most commericially made cereals have adequate fiber.  Finally I recommended daily use of Miralax., metamucil, citrucel, benefiber  or fibercon.       Colon cancer screening    Cologuard was negative Dec 2016       Other Visit Diagnoses    Pure hypercholesterolemia       Relevant Orders   Lipid panel (Completed)   LDL cholesterol, direct (Completed)   LDL cholesterol, direct   Lipid panel   Fatigue, unspecified type       Relevant Orders   TSH (Completed)   CBC with Differential/Platelet (Completed)   Impaired fasting glucose       Relevant Orders   Comprehensive  metabolic panel   Hemoglobin A1c      I have discontinued Mr. Stueve Benzoyl Peroxide. I am also having him start on ALPRAZolam. Additionally, I am having him maintain his SALINE NA, Tetrahydrozoline HCl (VISINE OP), HYDROCORTISONE (TOPICAL), trolamine salicylate, multivitamin-lutein, cholecalciferol, amLODipine, latanoprost, timolol, Aspirin-Salicylamide-Caffeine (BC HEADACHE POWDER PO), diphenhydramine-acetaminophen, and fluticasone.  Meds ordered this encounter  Medications  . ALPRAZolam (XANAX) 0.25 MG  tablet    Sig: Take 1 tablet (0.25 mg total) by mouth at bedtime as needed for anxiety.    Dispense:  30 tablet    Refill:  0    Medications Discontinued During This Encounter  Medication Reason  . Benzoyl Peroxide 9.8 % FOAM Patient has not taken in last 30 days    Follow-up: No Follow-up on file.   Crecencio Mc, MD

## 2016-11-02 DIAGNOSIS — K5900 Constipation, unspecified: Secondary | ICD-10-CM | POA: Insufficient documentation

## 2016-11-02 DIAGNOSIS — F5105 Insomnia due to other mental disorder: Secondary | ICD-10-CM

## 2016-11-02 DIAGNOSIS — E1122 Type 2 diabetes mellitus with diabetic chronic kidney disease: Secondary | ICD-10-CM | POA: Insufficient documentation

## 2016-11-02 DIAGNOSIS — E785 Hyperlipidemia, unspecified: Secondary | ICD-10-CM | POA: Insufficient documentation

## 2016-11-02 DIAGNOSIS — E1121 Type 2 diabetes mellitus with diabetic nephropathy: Secondary | ICD-10-CM | POA: Insufficient documentation

## 2016-11-02 DIAGNOSIS — E1129 Type 2 diabetes mellitus with other diabetic kidney complication: Secondary | ICD-10-CM | POA: Insufficient documentation

## 2016-11-02 DIAGNOSIS — E1169 Type 2 diabetes mellitus with other specified complication: Secondary | ICD-10-CM | POA: Insufficient documentation

## 2016-11-02 DIAGNOSIS — F409 Phobic anxiety disorder, unspecified: Secondary | ICD-10-CM | POA: Insufficient documentation

## 2016-11-02 DIAGNOSIS — D170 Benign lipomatous neoplasm of skin and subcutaneous tissue of head, face and neck: Secondary | ICD-10-CM | POA: Insufficient documentation

## 2016-11-02 NOTE — Assessment & Plan Note (Signed)
Cologuard was negative Dec 2016

## 2016-11-02 NOTE — Assessment & Plan Note (Signed)
Well controlled on current regimen. Renal function stable, no changes today. Lab Results  Component Value Date   CREATININE 1.49 10/30/2016   Lab Results  Component Value Date   NA 138 10/30/2016   K 4.2 10/30/2016   CL 102 10/30/2016   CO2 28 10/30/2016

## 2016-11-02 NOTE — Assessment & Plan Note (Signed)
Alprazolam prescribed. The risks and benefits of benzodiazepine use were discussed with patient today including excessive sedation leading to respiratory depression,  impaired thinking/driving, and addiction.  Patient was advised to avoid concurrent use with alcohol, to use medication only as needed and not to share with others  .

## 2016-11-02 NOTE — Assessment & Plan Note (Signed)
He will return for a1c

## 2016-11-02 NOTE — Assessment & Plan Note (Signed)
Recommended an increase in fiber intake to 25 to 35 grams daily.  Made several dietary suggestions of high fiber content including Mission low carb whole wheat tortillas which have 26 g fiber/serving, Toufayan flatbread which has around 10 g fiber,   Daily serving of any type of nuts,  and Atkins protein bars which have 8 to 10 g fiber.   Dispelled the popularly held notion that Quaker oats oatmeal, whole grain bread and most commericially made cereals have adequate fiber.  Finally I recommended daily use of Miralax., metamucil, citrucel, benefiber  or fibercon.  

## 2016-11-02 NOTE — Assessment & Plan Note (Signed)
Discussed referral to General Surgeon

## 2016-11-02 NOTE — Assessment & Plan Note (Signed)
Continue supplementation  ?

## 2016-11-02 NOTE — Assessment & Plan Note (Signed)
Triglycerides are elevated today to over 300.  Recommended a low glycemic index diet,  and regular exercise.  Advised t o  repeat in 6 weeks with a1c    Lab Results  Component Value Date   CHOL 223* 07/06/2014   HDL 52.20 07/06/2014   LDLDIRECT 109.0 07/06/2014   TRIG 343.0* 07/06/2014   CHOLHDL 4 07/06/2014   Lab Results  Component Value Date   ALT 20 07/06/2014   AST 24 07/06/2014   ALKPHOS 73 07/06/2014   BILITOT 0.4 07/06/2014       Lab Results  Component Value Date   CHOL 206 (H) 10/30/2016   HDL 29.80 (L) 10/30/2016   LDLCALC 107 (H) 09/23/2013   LDLDIRECT 104.0 10/30/2016   TRIG 331.0 (H) 10/30/2016   CHOLHDL 7 10/30/2016

## 2016-12-13 ENCOUNTER — Other Ambulatory Visit (INDEPENDENT_AMBULATORY_CARE_PROVIDER_SITE_OTHER): Payer: Medicare Other

## 2016-12-13 DIAGNOSIS — E7800 Pure hypercholesterolemia, unspecified: Secondary | ICD-10-CM

## 2016-12-13 DIAGNOSIS — R7301 Impaired fasting glucose: Secondary | ICD-10-CM | POA: Diagnosis not present

## 2016-12-13 LAB — LIPID PANEL
CHOLESTEROL: 167 mg/dL (ref 0–200)
HDL: 28.8 mg/dL — ABNORMAL LOW (ref >39.00)
NonHDL: 138.21
Total CHOL/HDL Ratio: 6
Triglycerides: 308 mg/dL — ABNORMAL HIGH (ref 0.0–149.0)
VLDL: 61.6 mg/dL — ABNORMAL HIGH (ref 0.0–40.0)

## 2016-12-13 LAB — COMPREHENSIVE METABOLIC PANEL
ALK PHOS: 79 U/L (ref 39–117)
ALT: 24 U/L (ref 0–53)
AST: 18 U/L (ref 0–37)
Albumin: 4 g/dL (ref 3.5–5.2)
BUN: 16 mg/dL (ref 6–23)
CALCIUM: 9.5 mg/dL (ref 8.4–10.5)
CO2: 28 mEq/L (ref 19–32)
Chloride: 102 mEq/L (ref 96–112)
Creatinine, Ser: 1.59 mg/dL — ABNORMAL HIGH (ref 0.40–1.50)
GFR: 44.89 mL/min — AB (ref >60.00)
Glucose, Bld: 197 mg/dL — ABNORMAL HIGH (ref 70–99)
Potassium: 3.9 mEq/L (ref 3.5–5.1)
Sodium: 138 mEq/L (ref 135–145)
TOTAL PROTEIN: 6.9 g/dL (ref 6.0–8.3)
Total Bilirubin: 1 mg/dL (ref 0.2–1.2)

## 2016-12-13 LAB — LDL CHOLESTEROL, DIRECT: LDL DIRECT: 85 mg/dL

## 2016-12-13 LAB — HEMOGLOBIN A1C: Hgb A1c MFr Bld: 7.7 % — ABNORMAL HIGH (ref 4.6–6.5)

## 2017-01-06 ENCOUNTER — Other Ambulatory Visit: Payer: Self-pay | Admitting: Internal Medicine

## 2017-01-28 ENCOUNTER — Ambulatory Visit (INDEPENDENT_AMBULATORY_CARE_PROVIDER_SITE_OTHER): Payer: Medicare Other | Admitting: Internal Medicine

## 2017-01-28 ENCOUNTER — Encounter: Payer: Self-pay | Admitting: Internal Medicine

## 2017-01-28 DIAGNOSIS — E1165 Type 2 diabetes mellitus with hyperglycemia: Secondary | ICD-10-CM

## 2017-01-28 NOTE — Patient Instructions (Addendum)
Please check your blood sugars once a day and record the readings on the sheet provided  So I can see how your sugar is running at different times of the day .   This is  my example of a  "Low GI"  Diet:  You do not have to follow it , but there are lots of low carbohydrate choices listed that you can  try  ,  To lower your daily carbohydrate intake    All of the foods can be found at grocery stores and in bulk at [employer].  The Atkins protein bars and shakes are available in more varieties at Target, [employer] and [employer].     7 AM Breakfast:  Choose from the following:  Low carbohydrate Protein  Shakes (I recommend the  Premier Protein chocolate shakes,  EAS AdvantEdge "Carb Control" shakes  Or the Atkins shakes all are under 3 net carbs)     a scrambled egg/bacon/cheese burrito made with Mission's "carb balance" whole wheat tortilla  (about 10 net carbs )  Regulatory affairs officer (basically a quiche without the pastry crust) that is eaten cold and very convenient way to get your eggs.  8 carbs)  If you make your own protein shakes, avoid bananas and pineapple,  And use low carb greek yogurt or original /unsweetened almond or soy milk    Avoid cereal and bananas, oatmeal and cream of wheat and grits. They are loaded with carbohydrates!   10 AM: high protein snack:  Protein bar by Atkins (the snack size, under 200 cal, usually < 6 net carbs).    A stick of cheese:  Around 1 carb,  100 cal     Dannon Light n Fit [place] Yogurt  (80 cal, 8 carbs)  Other so called "protein bars" and Greek yogurts tend to be loaded with carbohydrates.  Remember, in food advertising, the word "energy" is synonymous for " carbohydrate."  Lunch:   A Sandwich using the bread choices listed, Can use any  Eggs,  lunchmeat, grilled meat or canned tuna), avocado, regular mayo/mustard  and cheese.  A Salad using blue cheese, ranch,  Goddess or vinagrette,  Avoid taco shells, croutons or "confetti" and  no "candied nuts" but regular nuts OK.   No pretzels, nabs  or chips.  Pickles and miniature sweet peppers are a good low carb alternative that provide a "crunch"  The bread is the only source of carbohydrate in a sandwich and  can be decreased by trying some of the attached alternatives to traditional loaf bread   Avoid "Low fat dressings, as well as [place] dressings They are loaded with sugar!   3 PM/ Mid day  Snack:  Consider  1 ounce of  almonds, walnuts, pistachios, pecans, peanuts,  Macadamia nuts or a nut medley.  Avoid "granola and granola bars "  Mixed nuts are ok in moderation as long as there are no raisins,  cranberries or dried fruit.   KIND bars are OK if you get the low glycemic index variety   Try the prosciutto/mozzarella cheese sticks by F[employer]ci  In deli /backery section   High protein      6 PM  Dinner:     Meat/fowl/fish with a green salad, and either broccoli, cauliflower, green beans, spinach, brussel sprouts or  Lima beans. DO NOT BREAD THE PROTEIN!!      There is a low carb pasta by Dreamfield's that is acceptable and  tastes great: only 5 digestible carbs/serving.( All grocery stores but BJs carry it ) Several ready made meals are available low carb:   Try Michel Angelo's chicken piccata or chicken or eggplant parm over low carb pasta.(Lowes and BJs)   Marjory Lies Sanchez's "Carnitas" (pulled pork, no sauce,  0 carbs) or his beef pot roast to make a dinner burrito (at [employer]J's)  Pesto over low carb pasta (bj's sells a good quality pesto in the center refrigerated section of the deli   Try satueeing  Cheral Marker with mushroooms as a good side   [employer] Giant makes a mashed cauliflower that tastes like mashed potatoes  Whole wheat pasta is still full of digestible carbs and  Not as low in glycemic index as Dreamfield's.   Brown rice is still rice,  So skip the rice and noodles if you eat [place] or [place] (or at least limit to 1/2 cup)  9 PM snack :   Breyer's  "low carb" fudgsicle or  ice cream bar (Carb Smart line), or  Weight Watcher's ice cream bar , or another "no sugar added" ice cream;  a serving of fresh berries/cherries with whipped cream   Cheese or DANNON'S LlGHT N FIT GREEK YOGURT  8 ounces of [place] unsweetened almond/cococunut milk    Treat yourself to a parfait made with whipped cream blueberiies, walnuts and vanilla greek yogurt  Avoid bananas, pineapple, grapes  and watermelon on a regular basis because they are high in sugar.  THINK OF THEM AS DESSERT  Remember that snack Substitutions should be less than 10 NET carbs per serving and meals < 20 carbs. Remember to subtract fiber grams to get the "net carbs."

## 2017-01-28 NOTE — Progress Notes (Signed)
Subjective:  Patient ID: Anthony Allen, male    DOB: 1935/10/31  Age: 81 y.o. MRN: [id number]  CC: The encounter diagnosis was Type 2 diabetes mellitus with hyperglycemia, without long-term current use of insulin ([place]).  HPI Anthony Allen presents for new onset diabetes.  Patient was screened for diabetes last month with an A1c which was surprisingly  High at 7.7 .  He had an elevated a1c 3 years ago of 6.4 but    Has reduced sugar intake   To 5 cokes daily  Now 1/ 2 coke total daily now  Reviewed diet:    foot xam normal    Outpatient Medications Prior to Visit  Medication Sig Dispense Refill  . ALPRAZolam (XANAX) 0.25 MG tablet Take 1 tablet (0.25 mg total) by mouth at bedtime as needed for anxiety. 30 tablet 0  . amLODipine (NORVASC) 10 MG tablet take 1 tablet by mouth once daily 30 tablet 5  . Aspirin-Salicylamide-Caffeine (BC HEADACHE POWDER PO) Take 1 packet by mouth as needed.    . cholecalciferol (VITAMIN D) 1000 units tablet Take 1,000 Units by mouth daily.    . diphenhydramine-acetaminophen (TYLENOL PM) 25-500 MG TABS tablet Take 1 tablet by mouth at bedtime as needed.    . fluticasone (FLONASE) 50 MCG/ACT nasal spray Place 2 sprays into both nostrils daily.    Marland Kitchen HYDROCORTISONE, TOPICAL, 2 % LOTN Apply 1 application topically as needed (itching).    Marland Kitchen latanoprost (XALATAN) 0.005 % ophthalmic solution place 1 drop into right eye at bedtime  0  . multivitamin-lutein (OCUVITE-LUTEIN) CAPS capsule Take 1 capsule by mouth daily.    Marland Kitchen SALINE NA Place 2 sprays into both nostrils as needed (allergies).    . Tetrahydrozoline HCl (VISINE OP) Place 2 drops into both eyes daily as needed (allergies).    . timolol (TIMOPTIC) 0.5 % ophthalmic solution take 1 drop into right eye once daily  0  . trolamine salicylate (MYOFLEX) 10 % cream Apply 1 application topically as needed (muscle pain). Reported on 09/06/2015     No facility-administered medications prior to visit.     Review  of Systems;  Patient denies headache, fevers, malaise, unintentional weight loss, skin rash, eye pain, sinus congestion and sinus pain, sore throat, dysphagia,  hemoptysis , cough, dyspnea, wheezing, chest pain, palpitations, orthopnea, edema, abdominal pain, nausea, melena, diarrhea, constipation, flank pain, dysuria, hematuria, urinary  Frequency, nocturia, numbness, tingling, seizures,  Focal weakness, Loss of consciousness,  Tremor, insomnia, depression, anxiety, and suicidal ideation.      Objective:  BP 132/68 (BP Location: Left Arm, Patient Position: Sitting, Cuff Size: Normal)   Pulse 71   Temp 97.6 F (36.4 C) (Oral)   Resp 16   Ht 5\' 9"  (1.753 m)   Wt 163 lb 9.6 oz (74.2 kg)   SpO2 94%   BMI 24.16 kg/m   BP Readings from Last 3 Encounters:  01/28/17 132/68  10/30/16 140/70  09/05/16 (!) 142/80    Wt Readings from Last 3 Encounters:  01/28/17 163 lb 9.6 oz (74.2 kg)  10/30/16 165 lb 6.4 oz (75 kg)  09/05/16 167 lb 12.8 oz (76.1 kg)    General appearance: alert, cooperative and appears stated age Ears: normal TM's and external ear canals both ears Throat: lips, mucosa, and tongue normal; teeth and gums normal Neck: no adenopathy, no carotid bruit, supple, symmetrical, trachea midline and thyroid not enlarged, symmetric, no tenderness/mass/nodules Back: symmetric, no curvature. ROM normal. No CVA tenderness. Lungs: clear to  auscultation bilaterally Heart: regular rate and rhythm, S1, S2 normal, no murmur, click, rub or gallop Abdomen: soft, non-tender; bowel sounds normal; no masses,  no organomegaly Pulses: 2+ and symmetric Skin: Skin color, texture, turgor normal. No rashes or lesions Lymph nodes: Cervical, supraclavicular, and axillary nodes normal.  Lab Results  Component Value Date   HGBA1C 7.7 (H) 12/13/2016   HGBA1C 6.4 09/23/2013    Lab Results  Component Value Date   CREATININE 1.59 (H) 12/13/2016   CREATININE 1.49 10/30/2016   CREATININE 1.44  05/02/2015    Lab Results  Component Value Date   WBC 7.2 10/30/2016   HGB 17.4 (H) 10/30/2016   HCT 49.7 10/30/2016   PLT 212.0 10/30/2016   GLUCOSE 197 (H) 12/13/2016   CHOL 167 12/13/2016   TRIG 308.0 (H) 12/13/2016   HDL 28.80 (L) 12/13/2016   LDLDIRECT 85.0 12/13/2016   LDLCALC 107 (H) 09/23/2013   ALT 24 12/13/2016   AST 18 12/13/2016   NA 138 12/13/2016   K 3.9 12/13/2016   CL 102 12/13/2016   CREATININE 1.59 (H) 12/13/2016   BUN 16 12/13/2016   CO2 28 12/13/2016   TSH 2.10 10/30/2016   PSA 1.52 06/23/2013   HGBA1C 7.7 (H) 12/13/2016    Assessment & Plan:   Problem List Items Addressed This Visit    Type 2 diabetes mellitus with hyperglycemia ([place])    Patient counselled on diet and instructed on how to check sugars.  He was given a glucometer and asked to check blood sugars once daily .  Return log of blood sugars in 2 weeks . A total of 40 minutes was spent with patient more than half of which was spent in counseling patient on the above mentioned issues , reviewing and explaining recent labs, and coordination of care.         I am having Mr. Wombles maintain his SALINE NA, Tetrahydrozoline HCl (VISINE OP), HYDROCORTISONE (TOPICAL), trolamine salicylate, multivitamin-lutein, cholecalciferol, latanoprost, timolol, Aspirin-Salicylamide-Caffeine (BC HEADACHE POWDER PO), diphenhydramine-acetaminophen, fluticasone, ALPRAZolam, and amLODipine.  No orders of the defined types were placed in this encounter.   There are no discontinued medications.  Follow-up: No Follow-up on file.   Crecencio Mc, MD

## 2017-01-29 NOTE — Assessment & Plan Note (Addendum)
Patient counselled on diet and instructed on how to check sugars.  He was given a glucometer and asked to check blood sugars once daily .  Return log of blood sugars in 2 weeks . A total of 40 minutes was spent with patient more than half of which was spent in counseling patient on the above mentioned issues , reviewing and explaining recent labs, and coordination of care.

## 2017-01-30 ENCOUNTER — Telehealth: Payer: Self-pay | Admitting: *Deleted

## 2017-01-30 MED ORDER — ONETOUCH ULTRASOFT LANCETS MISC: 5 refills | Status: DC

## 2017-01-30 MED ORDER — GLUCOSE BLOOD VI STRP: ORAL_STRIP | 5 refills | Status: DC

## 2017-01-30 NOTE — Telephone Encounter (Signed)
Spoke with pt's wife and found out what type of glucometer he uses and sent the rx in to the [facility] on [address] per pt's wife.

## 2017-01-30 NOTE — Telephone Encounter (Signed)
Pt requested to have a medication refill for diabetic supplies - test stirps and needles Pharmacy [facility] [address]

## 2017-02-19 ENCOUNTER — Telehealth: Payer: Self-pay | Admitting: Internal Medicine

## 2017-02-19 DIAGNOSIS — E1165 Type 2 diabetes mellitus with hyperglycemia: Secondary | ICD-10-CM

## 2017-02-19 NOTE — Telephone Encounter (Signed)
Placed in yellow results note folder

## 2017-02-19 NOTE — Telephone Encounter (Signed)
Pt came in and dropped off some blood sugar reading. Place in color folder up front.

## 2017-02-21 MED ORDER — METFORMIN HCL 500 MG PO TABS: 500.0000 mg | ORAL_TABLET | Freq: Two times a day (BID) | ORAL | 3 refills | Status: DC

## 2017-02-21 NOTE — Telephone Encounter (Signed)
Left message for patient to return call back.  

## 2017-02-21 NOTE — Telephone Encounter (Signed)
Sugars reviewed,  Seem to be higher in the mornings and afternoons.  May be related to food choices.  Recommend choosing higher protein meals,  Less bread/muffins  And start  metformin 500 mg bid .  Can take before or after meals. .  rx sent to pharmacy.  Most common side effect is decreased appetite and loose stools..  Should imporve after 1-2 weeks.  Call if not.

## 2017-02-21 NOTE — Assessment & Plan Note (Addendum)
Lab Results  Component Value Date   HGBA1C 7.7 (H) 12/13/2016   Reviewed BS log .  fasitngs 146 , 184. Post breakfast 135 , 184, 170,, 134.  Pre lunch 171 , 137, 146,  226  Post Lunch 167, 184, 135.  Pre Dinner 124, 121,  160  . Post dinner 171, 138   sugars are higher in the morning and afternoon   startimg metformin 500 mg bid.

## 2017-02-24 NOTE — Telephone Encounter (Signed)
Spoke with pt and informed him of the medication that Dr. Derrel Nip would like for him to start taking due to his sugar readings. The pt gave a verbal understanding and stated that he would go pick up the medication today.

## 2017-02-24 NOTE — Telephone Encounter (Signed)
Pt has requested a return call [phone]

## 2017-02-24 NOTE — Telephone Encounter (Signed)
LMTCB

## 2017-02-25 ENCOUNTER — Telehealth: Payer: Self-pay | Admitting: Internal Medicine

## 2017-02-25 NOTE — Telephone Encounter (Signed)
[employer]. [employer] out [employer].

## 2017-02-25 NOTE — Telephone Encounter (Signed)
Pt wanted to let office know for now on all medication refills go to the [facility] on [address].  Thanks.

## 2017-05-06 ENCOUNTER — Encounter: Payer: Self-pay | Admitting: Internal Medicine

## 2017-05-06 ENCOUNTER — Ambulatory Visit: Payer: Medicare Other | Admitting: Internal Medicine

## 2017-05-06 VITALS — BP 126/78 | HR 94 | Temp 97.6°F | Resp 15 | Ht 69.0 in | Wt 158.0 lb

## 2017-05-06 DIAGNOSIS — R5383 Other fatigue: Secondary | ICD-10-CM

## 2017-05-06 DIAGNOSIS — E785 Hyperlipidemia, unspecified: Secondary | ICD-10-CM

## 2017-05-06 DIAGNOSIS — E1165 Type 2 diabetes mellitus with hyperglycemia: Secondary | ICD-10-CM

## 2017-05-06 DIAGNOSIS — I10 Essential (primary) hypertension: Secondary | ICD-10-CM | POA: Diagnosis not present

## 2017-05-06 DIAGNOSIS — I700 Atherosclerosis of aorta: Secondary | ICD-10-CM

## 2017-05-06 DIAGNOSIS — H409 Unspecified glaucoma: Secondary | ICD-10-CM | POA: Insufficient documentation

## 2017-05-06 LAB — COMPREHENSIVE METABOLIC PANEL
ALT: 24 U/L (ref 0–53)
AST: 17 U/L (ref 0–37)
Albumin: 4 g/dL (ref 3.5–5.2)
Alkaline Phosphatase: 86 U/L (ref 39–117)
BUN: 21 mg/dL (ref 6–23)
CALCIUM: 9.5 mg/dL (ref 8.4–10.5)
CO2: 28 meq/L (ref 19–32)
CREATININE: 1.54 mg/dL — AB (ref 0.40–1.50)
Chloride: 103 mEq/L (ref 96–112)
GFR: 46.53 mL/min — ABNORMAL LOW (ref >60.00)
GLUCOSE: 198 mg/dL — AB (ref 70–99)
Potassium: 4.1 mEq/L (ref 3.5–5.1)
Sodium: 138 mEq/L (ref 135–145)
TOTAL PROTEIN: 7.1 g/dL (ref 6.0–8.3)
Total Bilirubin: 0.6 mg/dL (ref 0.2–1.2)

## 2017-05-06 LAB — CBC WITH DIFFERENTIAL/PLATELET
BASOS ABS: 0.1 10*3/uL (ref 0.0–0.1)
Basophils Relative: 1.2 % (ref 0.0–3.0)
EOS ABS: 0.3 10*3/uL (ref 0.0–0.7)
Eosinophils Relative: 4.3 % (ref 0.0–5.0)
HEMATOCRIT: 48.5 % (ref 39.0–52.0)
Hemoglobin: 16.6 g/dL (ref 13.0–17.0)
LYMPHS PCT: 18.2 % (ref 12.0–46.0)
Lymphs Abs: 1.3 10*3/uL (ref 0.7–4.0)
MCHC: 34.3 g/dL (ref 30.0–36.0)
MCV: 93.5 fl (ref 78.0–100.0)
MONOS PCT: 6.8 % (ref 3.0–12.0)
Monocytes Absolute: 0.5 10*3/uL (ref 0.1–1.0)
NEUTROS ABS: 4.9 10*3/uL (ref 1.4–7.7)
Neutrophils Relative %: 69.5 % (ref 43.0–77.0)
PLATELETS: 235 10*3/uL (ref 150.0–400.0)
RBC: 5.19 Mil/uL (ref 4.22–5.81)
RDW: 13.3 % (ref 11.5–15.5)
WBC: 7.1 10*3/uL (ref 4.0–10.5)

## 2017-05-06 LAB — MICROALBUMIN / CREATININE URINE RATIO
CREATININE, U: 177.3 mg/dL
MICROALB UR: 5.3 mg/dL — AB (ref 0.0–1.9)
Microalb Creat Ratio: 3 mg/g (ref 0.0–30.0)

## 2017-05-06 LAB — LIPID PANEL
CHOL/HDL RATIO: 6
Cholesterol: 195 mg/dL (ref 0–200)
HDL: 30.2 mg/dL — ABNORMAL LOW (ref >39.00)
NONHDL: 164.53
Triglycerides: 394 mg/dL — ABNORMAL HIGH (ref 0.0–149.0)
VLDL: 78.8 mg/dL — AB (ref 0.0–40.0)

## 2017-05-06 LAB — VITAMIN B12: VITAMIN B 12: 713 pg/mL (ref 211–911)

## 2017-05-06 LAB — LDL CHOLESTEROL, DIRECT: Direct LDL: 117 mg/dL

## 2017-05-06 LAB — HEMOGLOBIN A1C: HEMOGLOBIN A1C: 6.9 % — AB (ref 4.6–6.5)

## 2017-05-06 MED ORDER — EMPAGLIFLOZIN 10 MG PO TABS: 10.0000 mg | ORAL_TABLET | Freq: Every day | ORAL | 3 refills | Status: DC

## 2017-05-06 NOTE — Assessment & Plan Note (Signed)
Improved pressures,  Every 3 months checked by [facility] .

## 2017-05-06 NOTE — Progress Notes (Signed)
Subjective:  Patient ID: Anthony Allen, male    DOB: 08-09-1935  Age: 81 y.o. MRN: [id number]  CC: The primary encounter diagnosis was Hyperlipidemia LDL goal <100. Diagnoses of Glaucoma of both eyes, unspecified glaucoma type, Type 2 diabetes mellitus with hyperglycemia, without long-term current use of insulin ([place]), Fatigue, unspecified type, Essential hypertension, benign, and Aortic atherosclerosis ([place]) were also pertinent to this visit.  HPI Anthony Allen presents for 3 month follow up on  Newly diagnosed diabetes.  Patient has no complaints today  Except persistent diarrhea with use of metformin, so he has reduced his dose to once daily .  Patient is following a low glycemic index diet and taking all prescribed medications regularly without side effects.  Fasting sugars have been under less than 140 most of the time and post prandials have been under 160 except on rare occasions. Patient is walks for exercise about 3 times per week and intentionally trying to lose weight .  Patient has had an eye exam  Every 3 months to follow his glaucoma  And  checks feet regularly for signs of infection.  Patient does not walk barefoot outside, because he has persistent mild numbness  in both feet. Patient is up to date on all recommended vaccinations  Lab Results  Component Value Date   MICROALBUR 5.3 (H) 05/06/2017    Sees eye doctor for glaucoma every 3 months' Wakes up tired.  No energy   Lab Results  Component Value Date   [id number] 366 05/06/2017    Lab Results  Component Value Date   TSH 2.10 10/30/2016    Lab Results  Component Value Date   HGBA1C 6.9 (H) 05/06/2017     Outpatient Medications Prior to Visit  Medication Sig Dispense Refill  . amLODipine (NORVASC) 10 MG tablet take 1 tablet by mouth once daily 30 tablet 5  . Aspirin-Salicylamide-Caffeine (BC HEADACHE POWDER PO) Take 1 packet by mouth as needed.    . cholecalciferol (VITAMIN D) 1000 units tablet Take 1,000  Units by mouth daily.    . diphenhydramine-acetaminophen (TYLENOL PM) 25-500 MG TABS tablet Take 1 tablet by mouth at bedtime as needed.    . fluticasone (FLONASE) 50 MCG/ACT nasal spray Place 2 sprays into both nostrils daily.    Marland Kitchen glucose blood test strip Use as instructed 100 each 5  . HYDROCORTISONE, TOPICAL, 2 % LOTN Apply 1 application topically as needed (itching).    . Lancets (ONETOUCH ULTRASOFT) lancets Use as instructed 100 each 5  . latanoprost (XALATAN) 0.005 % ophthalmic solution place 1 drop into right eye at bedtime  0  . multivitamin-lutein (OCUVITE-LUTEIN) CAPS capsule Take 1 capsule by mouth daily.    Marland Kitchen SALINE NA Place 2 sprays into both nostrils as needed (allergies).    . timolol (TIMOPTIC) 0.5 % ophthalmic solution take 1 drop into right eye once daily  0  . trolamine salicylate (MYOFLEX) 10 % cream Apply 1 application topically as needed (muscle pain). Reported on 09/06/2015    . metFORMIN (GLUCOPHAGE) 500 MG tablet Take 1 tablet (500 mg total) by mouth 2 (two) times daily with a meal. 180 tablet 3  . ALPRAZolam (XANAX) 0.25 MG tablet Take 1 tablet (0.25 mg total) by mouth at bedtime as needed for anxiety. (Patient not taking: Reported on 05/06/2017) 30 tablet 0  . Tetrahydrozoline HCl (VISINE OP) Place 2 drops into both eyes daily as needed (allergies).     No facility-administered medications prior to visit.  Review of Systems;  Patient denies headache, fevers, malaise, unintentional weight loss, skin rash, eye pain, sinus congestion and sinus pain, sore throat, dysphagia,  hemoptysis , cough, dyspnea, wheezing, chest pain, palpitations, orthopnea, edema, abdominal pain, nausea, melena, diarrhea, constipation, flank pain, dysuria, hematuria, urinary  Frequency, nocturia, numbness, tingling, seizures,  Focal weakness, Loss of consciousness,  Tremor, insomnia, depression, anxiety, and suicidal ideation.      Objective:  BP 126/78 (BP Location: Left Arm, Patient  Position: Sitting, Cuff Size: Normal)   Pulse 94   Temp 97.6 F (36.4 C) (Oral)   Resp 15   Ht 5\' 9"  (1.753 m)   Wt 158 lb (71.7 kg)   SpO2 95%   BMI 23.33 kg/m   BP Readings from Last 3 Encounters:  05/06/17 126/78  01/28/17 132/68  10/30/16 140/70    Wt Readings from Last 3 Encounters:  05/06/17 158 lb (71.7 kg)  01/28/17 163 lb 9.6 oz (74.2 kg)  10/30/16 165 lb 6.4 oz (75 kg)    General appearance: alert, cooperative and appears stated age Ears: normal TM's and external ear canals both ears Throat: lips, mucosa, and tongue normal; teeth and gums normal Neck: no adenopathy, no carotid bruit, supple, symmetrical, trachea midline and thyroid not enlarged, symmetric, no tenderness/mass/nodules Back: symmetric, no curvature. ROM normal. No CVA tenderness. Lungs: clear to auscultation bilaterally Heart: regular rate and rhythm, S1, S2 normal, no murmur, click, rub or gallop Abdomen: soft, non-tender; bowel sounds normal; no masses,  no organomegaly Pulses: 2+ and symmetric Skin: Skin color, texture, turgor normal. No rashes or lesions Lymph nodes: Cervical, supraclavicular, and axillary nodes normal.  Lab Results  Component Value Date   HGBA1C 6.9 (H) 05/06/2017   HGBA1C 7.7 (H) 12/13/2016   HGBA1C 6.4 09/23/2013    Lab Results  Component Value Date   CREATININE 1.54 (H) 05/06/2017   CREATININE 1.59 (H) 12/13/2016   CREATININE 1.49 10/30/2016    Lab Results  Component Value Date   WBC 7.1 05/06/2017   HGB 16.6 05/06/2017   HCT 48.5 05/06/2017   PLT 235.0 05/06/2017   GLUCOSE 198 (H) 05/06/2017   CHOL 195 05/06/2017   TRIG 394.0 (H) 05/06/2017   HDL 30.20 (L) 05/06/2017   LDLDIRECT 117.0 05/06/2017   LDLCALC 107 (H) 09/23/2013   ALT 24 05/06/2017   AST 17 05/06/2017   NA 138 05/06/2017   K 4.1 05/06/2017   CL 103 05/06/2017   CREATININE 1.54 (H) 05/06/2017   BUN 21 05/06/2017   CO2 28 05/06/2017   TSH 2.10 10/30/2016   PSA 1.52 06/23/2013   HGBA1C  6.9 (H) 05/06/2017   MICROALBUR 5.3 (H) 05/06/2017      Assessment & Plan:   Problem List Items Addressed This Visit    Aortic atherosclerosis ([place])    Noted on 2014 CT ,  Given new onset type 2 DM and 10 yr risk of CAD ay 65% using FRC,  Will recommend statin trial and baby aspirin       Essential hypertension, benign    Well controlled on current regimen, but he has mild CKD and nephropathy. Will need to change amlodipine to losartan   Lab Results  Component Value Date   CREATININE 1.54 (H) 05/06/2017   Lab Results  Component Value Date   NA 138 05/06/2017   K 4.1 05/06/2017   CL 103 05/06/2017   CO2 28 05/06/2017   Lab Results  Component Value Date   MICROALBUR 5.3 (H) 05/06/2017  Fatigue    B12 and folate and thyroid are normal  .  Not anemic.  No known history of snoring or nocturia.  Wakes up tired.  No hypersomnolence. Uses benadryl to help sleep at times for insomnia. Will encourage daily exercise,  Sleep study if no improvement.  Lab Results  Component Value Date   [id number] 712 05/06/2017   Lab Results  Component Value Date   WBC 7.1 05/06/2017   HGB 16.6 05/06/2017   HCT 48.5 05/06/2017   MCV 93.5 05/06/2017   PLT 235.0 05/06/2017   Lab Results  Component Value Date   ALT 24 05/06/2017   AST 17 05/06/2017   ALKPHOS 86 05/06/2017   BILITOT 0.6 05/06/2017   Lab Results  Component Value Date   TSH 2.10 10/30/2016    Lab Results  Component Value Date   CREATININE 1.54 (H) 05/06/2017         Relevant Orders   B12 (Completed)   Folate RBC (Completed)   CBC with Differential/Platelet (Completed)   Glaucoma    Improved pressures,  Every 3 months checked by [facility] .      Hyperlipidemia LDL goal <100 - Primary    Despite dietary changes his Triglycerides remain elevated  today to over 300 and his 10 yr risk of CAD is 65%  .  Will recommend trial of crestor and baby aspirin.    Advised t o  repeat in 6 weeks with a1c    Lab  Results  Component Value Date   CHOL 223* 07/06/2014   HDL 52.20 07/06/2014   LDLDIRECT 109.0 07/06/2014   TRIG 343.0* 07/06/2014   CHOLHDL 4 07/06/2014   Lab Results  Component Value Date   ALT 20 07/06/2014   AST 24 07/06/2014   ALKPHOS 73 07/06/2014   BILITOT 0.4 07/06/2014       Lab Results  Component Value Date   CHOL 195 05/06/2017   HDL 30.20 (L) 05/06/2017   LDLCALC 107 (H) 09/23/2013   LDLDIRECT 117.0 05/06/2017   TRIG 394.0 (H) 05/06/2017   CHOLHDL 6 05/06/2017         Relevant Orders   Lipid panel (Completed)   Type 2 diabetes mellitus with hyperglycemia ([place])    He has changed his diet and reduced his BS.  Not tolerating metformin due to persistent diarrhea.  Changing medication  to jardiance 10 mg daily   Lab Results  Component Value Date   HGBA1C 6.9 (H) 05/06/2017   Lab Results  Component Value Date   MICROALBUR 5.3 (H) 05/06/2017         Relevant Medications   empagliflozin (JARDIANCE) 10 MG TABS tablet   Other Relevant Orders   Comprehensive metabolic panel (Completed)   Hemoglobin A1c (Completed)   Microalbumin / creatinine urine ratio (Completed)      I have discontinued Nayquan Dicenso's Tetrahydrozoline HCl (VISINE OP), ALPRAZolam, and metFORMIN. I am also having him start on empagliflozin. Additionally, I am having him maintain his SALINE NA, HYDROCORTISONE (TOPICAL), trolamine salicylate, multivitamin-lutein, cholecalciferol, latanoprost, timolol, Aspirin-Salicylamide-Caffeine (BC HEADACHE POWDER PO), diphenhydramine-acetaminophen, fluticasone, amLODipine, glucose blood, and onetouch ultrasoft.  Meds ordered this encounter  Medications  . empagliflozin (JARDIANCE) 10 MG TABS tablet    Sig: Take 10 mg by mouth daily.    Dispense:  30 tablet    Refill:  3    Medications Discontinued During This Encounter  Medication Reason  . Tetrahydrozoline HCl (VISINE OP) Patient has not taken in last 30 days  .  metFORMIN (GLUCOPHAGE) 500  MG tablet   . ALPRAZolam (XANAX) 0.25 MG tablet    A total of 25 minutes of face to face time was spent with patient more than half of which was spent in counselling about the above mentioned conditions  and coordination of care  Follow-up: Return in about 3 months (around 08/06/2017) for follow up diabetes.   Crecencio Mc, MD

## 2017-05-06 NOTE — Patient Instructions (Addendum)
Stop the metformin.  We will try using jardiance instead.  Does not cause diarrhea, but may increase your urination   Starting dose is 10 mg daily   Your Fasting  sugars shouldr be < 130   And post prandials (2 hrs after a meal) should be < 160 .  We can increase the dose after one month to 25 mg daily if you are not at goal   You need 1200 mg calcium and 2000 Ius D3  Daily .      Empagliflozin oral tablets What is this medicine? EMPAGLIFLOZIN (EM pa gli FLOE zin) helps to treat type 2 diabetes. It helps to control blood sugar. This drug may also reduce the risk of heart attack or stroke if you have type 2 diabetes and risk factors for heart disease. Treatment is combined with diet and exercise. This medicine may be used for other purposes; ask your health care provider or pharmacist if you have questions. COMMON BRAND NAME(S): JARDIANCE What should I tell my health care provider before I take this medicine? They need to know if you have any of these conditions: -dehydration -diabetic ketoacidosis -diet low in salt -eating less due to illness, surgery, dieting, or any other reason -having surgery -high cholesterol -high levels of potassium in the blood -history of pancreatitis or pancreas problems -history of yeast infection of the penis or vagina -if you often drink alcohol -infections in the bladder, kidneys, or urinary tract -kidney disease -liver disease -low blood pressure -on hemodialysis -problems urinating -type 1 diabetes -uncircumcised male -an unusual or allergic reaction to empagliflozin, other medicines, foods, dyes, or preservatives -pregnant or trying to get pregnant -breast-feeding How should I use this medicine? Take this medicine by mouth with a glass of water. Follow the directions on the prescription label. Take it in the morning, with or without food. Take your dose at the same time each day. Do not take more often than directed. Do not stop taking except  on your doctor's advice. Talk to your pediatrician regarding the use of this medicine in children. Special care may be needed. Overdosage: If you think you have taken too much of this medicine contact a poison control center or emergency room at once. NOTE: This medicine is only for you. Do not share this medicine with others. What if I miss a dose? If you miss a dose, take it as soon as you can. If it is almost time for your next dose, take only that dose. Do not take double or extra doses. What may interact with this medicine? Do not take this medicine with any of the following medications: -gatifloxacin This medicine may also interact with the following medications: -alcohol -certain medicines for blood pressure, heart disease -diuretics This list may not describe all possible interactions. Give your health care provider a list of all the medicines, herbs, non-prescription drugs, or dietary supplements you use. Also tell them if you smoke, drink alcohol, or use illegal drugs. Some items may interact with your medicine. What should I watch for while using this medicine? Visit your doctor or health care professional for regular checks on your progress. This medicine can cause a serious condition in which there is too much acid in the blood. If you develop nausea, vomiting, stomach pain, unusual tiredness, or breathing problems, stop taking this medicine and call your doctor right away. If possible, use a ketone dipstick to check for ketones in your urine. A test called the HbA1C (A1C) will be  monitored. This is a simple blood test. It measures your blood sugar control over the last 2 to 3 months. You will receive this test every 3 to 6 months. Learn how to check your blood sugar. Learn the symptoms of low and high blood sugar and how to manage them. Always carry a quick-source of sugar with you in case you have symptoms of low blood sugar. Examples include hard sugar candy or glucose tablets.  Make sure others know that you can choke if you eat or drink when you develop serious symptoms of low blood sugar, such as seizures or unconsciousness. They must get medical help at once. Tell your doctor or health care professional if you have high blood sugar. You might need to change the dose of your medicine. If you are sick or exercising more than usual, you might need to change the dose of your medicine. Do not skip meals. Ask your doctor or health care professional if you should avoid alcohol. Many nonprescription cough and cold products contain sugar or alcohol. These can affect blood sugar. Wear a medical ID bracelet or chain, and carry a card that describes your disease and details of your medicine and dosage times. What side effects may I notice from receiving this medicine? Side effects that you should report to your doctor or health care professional as soon as possible: -allergic reactions like skin rash, itching or hives, swelling of the face, lips, or tongue -breathing problems -dizziness -fast or irregular heartbeat -feeling faint or lightheaded, falls -muscle weakness -nausea, vomiting, unusual stomach upset or pain -signs and symptoms of low blood sugar such as feeling anxious, confusion, dizziness, increased hunger, unusually weak or tired, sweating, shakiness, cold, irritable, headache, blurred vision, fast heartbeat, loss of consciousness -signs and symptoms of a urinary tract infection, such as fever, chills, a burning feeling when urinating, blood in the urine, back pain -trouble passing urine or change in the amount of urine, including an urgent need to urinate more often, in larger amounts, or at night -penile discharge, itching, or pain in men -unusual tiredness -vaginal discharge, itching, or odor in women Side effects that usually do not require medical attention (report to your doctor or health care professional if they continue or are bothersome): -joint pain -mild  increase in urination -thirsty This list may not describe all possible side effects. Call your doctor for medical advice about side effects. You may report side effects to FDA at 1-800-FDA-1088. Where should I keep my medicine? Keep out of the reach of children. Store at room temperature between 20 and 25 degrees C (68 and 77 degrees F). Throw away any unused medicine after the expiration date. NOTE: This sheet is a summary. It may not cover all possible information. If you have questions about this medicine, talk to your doctor, pharmacist, or health care provider.  2018 Elsevier/Gold Standard (2015-06-29 11:46:10)

## 2017-05-07 LAB — FOLATE RBC: RBC FOLATE: 568 ng/mL (ref >280)

## 2017-05-08 DIAGNOSIS — I700 Atherosclerosis of aorta: Secondary | ICD-10-CM | POA: Insufficient documentation

## 2017-05-08 DIAGNOSIS — R5383 Other fatigue: Secondary | ICD-10-CM | POA: Insufficient documentation

## 2017-05-08 NOTE — Assessment & Plan Note (Signed)
Noted on 2014 CT ,  Given new onset type 2 DM and 10 yr risk of CAD ay 65% using FRC,  Will recommend statin trial and baby aspirin

## 2017-05-08 NOTE — Assessment & Plan Note (Signed)
He has changed his diet and reduced his BS.  Not tolerating metformin due to persistent diarrhea.  Changing medication  to jardiance 10 mg daily   Lab Results  Component Value Date   HGBA1C 6.9 (H) 05/06/2017   Lab Results  Component Value Date   MICROALBUR 5.3 (H) 05/06/2017

## 2017-05-08 NOTE — Assessment & Plan Note (Addendum)
B12 and folate and thyroid are normal  .  Not anemic.  No known history of snoring or nocturia.  Wakes up tired.  No hypersomnolence. Uses benadryl to help sleep at times for insomnia. Will encourage daily exercise,  Sleep study if no improvement.  Lab Results  Component Value Date   [id number] 258 05/06/2017   Lab Results  Component Value Date   WBC 7.1 05/06/2017   HGB 16.6 05/06/2017   HCT 48.5 05/06/2017   MCV 93.5 05/06/2017   PLT 235.0 05/06/2017   Lab Results  Component Value Date   ALT 24 05/06/2017   AST 17 05/06/2017   ALKPHOS 86 05/06/2017   BILITOT 0.6 05/06/2017   Lab Results  Component Value Date   TSH 2.10 10/30/2016    Lab Results  Component Value Date   CREATININE 1.54 (H) 05/06/2017

## 2017-05-08 NOTE — Assessment & Plan Note (Signed)
Despite dietary changes his Triglycerides remain elevated  today to over 300 and his 10 yr risk of CAD is 65%  .  Will recommend trial of crestor and baby aspirin.    Advised t o  repeat in 6 weeks with a1c    Lab Results  Component Value Date   CHOL 223* 07/06/2014   HDL 52.20 07/06/2014   LDLDIRECT 109.0 07/06/2014   TRIG 343.0* 07/06/2014   CHOLHDL 4 07/06/2014   Lab Results  Component Value Date   ALT 20 07/06/2014   AST 24 07/06/2014   ALKPHOS 73 07/06/2014   BILITOT 0.4 07/06/2014       Lab Results  Component Value Date   CHOL 195 05/06/2017   HDL 30.20 (L) 05/06/2017   LDLCALC 107 (H) 09/23/2013   LDLDIRECT 117.0 05/06/2017   TRIG 394.0 (H) 05/06/2017   CHOLHDL 6 05/06/2017

## 2017-05-08 NOTE — Assessment & Plan Note (Signed)
Well controlled on current regimen, but he has mild CKD and nephropathy. Will need to change amlodipine to losartan   Lab Results  Component Value Date   CREATININE 1.54 (H) 05/06/2017   Lab Results  Component Value Date   NA 138 05/06/2017   K 4.1 05/06/2017   CL 103 05/06/2017   CO2 28 05/06/2017   Lab Results  Component Value Date   MICROALBUR 5.3 (H) 05/06/2017

## 2017-05-09 ENCOUNTER — Telehealth: Payer: Self-pay

## 2017-05-09 MED ORDER — LOSARTAN POTASSIUM 50 MG PO TABS: 50.0000 mg | ORAL_TABLET | Freq: Every day | ORAL | 3 refills | Status: DC

## 2017-05-09 MED ORDER — ROSUVASTATIN CALCIUM 5 MG PO TABS: 5.0000 mg | ORAL_TABLET | Freq: Every day | ORAL | 3 refills | Status: DC

## 2017-05-09 NOTE — Telephone Encounter (Signed)
Copied from [place] [id number]. Topic: Quick Communication - Lab Results >> May 09, 2017  8:30 AM Adair Laundry, CMA wrote: Called patient to inform them of 05/08/2017 lab results. When patient returns call, triage nurse may disclose results. >> May 09, 2017  8:58 AM Curlene Labrum, RN wrote: Lab results and recommendations given to patient per order. He also agrees to take the generic Crestor, baby ASA, and Losartan. He is requesting for office to send lab results to him.

## 2017-05-09 NOTE — Telephone Encounter (Signed)
Patients wife is aware of medication changes and asked me to call back Monday to schedule labs

## 2017-05-09 NOTE — Telephone Encounter (Signed)
Please advise 

## 2017-05-09 NOTE — Telephone Encounter (Signed)
Ok to send labs!    I will  Print,  And the new medicatiosn have been sent to his [place]   Needs to lave non fasting  Labs in 3 weeks  For safety /monitoring of liver and kidney function .  Make sure he knows to stop his amlodipine

## 2017-05-12 NOTE — Telephone Encounter (Signed)
Yes,  Start the losartan as long as he has been off of the amlodipine for a few days

## 2017-05-12 NOTE — Telephone Encounter (Signed)
It depends.  I need more clinical information an you cal his pharmacy and find out the details (what medication,  what was the dose

## 2017-05-12 NOTE — Telephone Encounter (Signed)
Spoke with pharmacy and losartan is not an ace inhibitor. They have no record of ever filling an ace inhibitor for him. OK to advise patient to go ahead and start losartan?

## 2017-05-12 NOTE — Telephone Encounter (Signed)
Patient has been scheduled for labs 12/24. Patient also says that he did start the Crestor but has not started Losartan. He says that when he went to pharmacy they told him that Losartan was an ace inhibitor. Patient stated last time he was on an ace inhibitor it dropped his blood pressure so low he had to go to ER. Patient is wondering if you would still like for him to try this medication. Please advise?

## 2017-05-13 NOTE — Telephone Encounter (Signed)
Patient notified

## 2017-05-30 ENCOUNTER — Telehealth: Payer: Self-pay | Admitting: Radiology

## 2017-05-30 DIAGNOSIS — Z79899 Other long term (current) drug therapy: Secondary | ICD-10-CM

## 2017-05-30 NOTE — Telephone Encounter (Signed)
Pt coming in on Monday for labs, please place future orders. Thank you.

## 2017-05-30 NOTE — Telephone Encounter (Signed)
CMEt ordered

## 2017-05-30 NOTE — Addendum Note (Signed)
Addended by: Crecencio Mc on: 05/30/2017 01:31 PM   Modules accepted: Orders

## 2017-05-31 ENCOUNTER — Other Ambulatory Visit: Payer: Self-pay

## 2017-05-31 ENCOUNTER — Inpatient Hospital Stay
Admission: EM | Admit: 2017-05-31 | Discharge: 2017-06-02 | DRG: 065 | Disposition: A | Discharge status: Home Health | Payer: Medicare Other | Attending: Specialist | Admitting: Specialist

## 2017-05-31 ENCOUNTER — Emergency Department: Payer: Medicare Other

## 2017-05-31 ENCOUNTER — Encounter: Payer: Self-pay | Admitting: Emergency Medicine

## 2017-05-31 DIAGNOSIS — Z841 Family history of disorders of kidney and ureter: Secondary | ICD-10-CM

## 2017-05-31 DIAGNOSIS — R269 Unspecified abnormalities of gait and mobility: Secondary | ICD-10-CM | POA: Diagnosis present

## 2017-05-31 DIAGNOSIS — G459 Transient cerebral ischemic attack, unspecified: Secondary | ICD-10-CM | POA: Diagnosis not present

## 2017-05-31 DIAGNOSIS — R29702 NIHSS score 2: Secondary | ICD-10-CM | POA: Diagnosis present

## 2017-05-31 DIAGNOSIS — E785 Hyperlipidemia, unspecified: Secondary | ICD-10-CM | POA: Diagnosis present

## 2017-05-31 DIAGNOSIS — I700 Atherosclerosis of aorta: Secondary | ICD-10-CM | POA: Diagnosis present

## 2017-05-31 DIAGNOSIS — N189 Chronic kidney disease, unspecified: Secondary | ICD-10-CM | POA: Diagnosis present

## 2017-05-31 DIAGNOSIS — R4701 Aphasia: Secondary | ICD-10-CM | POA: Diagnosis present

## 2017-05-31 DIAGNOSIS — I129 Hypertensive chronic kidney disease with stage 1 through stage 4 chronic kidney disease, or unspecified chronic kidney disease: Secondary | ICD-10-CM | POA: Diagnosis present

## 2017-05-31 DIAGNOSIS — I639 Cerebral infarction, unspecified: Principal | ICD-10-CM | POA: Diagnosis present

## 2017-05-31 DIAGNOSIS — E1122 Type 2 diabetes mellitus with diabetic chronic kidney disease: Secondary | ICD-10-CM | POA: Diagnosis present

## 2017-05-31 DIAGNOSIS — R4781 Slurred speech: Secondary | ICD-10-CM | POA: Diagnosis present

## 2017-05-31 DIAGNOSIS — E1165 Type 2 diabetes mellitus with hyperglycemia: Secondary | ICD-10-CM

## 2017-05-31 DIAGNOSIS — N400 Enlarged prostate without lower urinary tract symptoms: Secondary | ICD-10-CM | POA: Diagnosis present

## 2017-05-31 DIAGNOSIS — M109 Gout, unspecified: Secondary | ICD-10-CM | POA: Diagnosis present

## 2017-05-31 DIAGNOSIS — Z823 Family history of stroke: Secondary | ICD-10-CM

## 2017-05-31 DIAGNOSIS — Z8249 Family history of ischemic heart disease and other diseases of the circulatory system: Secondary | ICD-10-CM

## 2017-05-31 DIAGNOSIS — R471 Dysarthria and anarthria: Secondary | ICD-10-CM | POA: Diagnosis present

## 2017-05-31 DIAGNOSIS — K219 Gastro-esophageal reflux disease without esophagitis: Secondary | ICD-10-CM | POA: Diagnosis present

## 2017-05-31 DIAGNOSIS — I6930 Unspecified sequelae of cerebral infarction: Secondary | ICD-10-CM | POA: Diagnosis present

## 2017-05-31 DIAGNOSIS — R1310 Dysphagia, unspecified: Secondary | ICD-10-CM | POA: Diagnosis present

## 2017-05-31 DIAGNOSIS — Z8052 Family history of malignant neoplasm of bladder: Secondary | ICD-10-CM

## 2017-05-31 DIAGNOSIS — Z7984 Long term (current) use of oral hypoglycemic drugs: Secondary | ICD-10-CM

## 2017-05-31 DIAGNOSIS — E870 Hyperosmolality and hypernatremia: Secondary | ICD-10-CM | POA: Diagnosis present

## 2017-05-31 DIAGNOSIS — H409 Unspecified glaucoma: Secondary | ICD-10-CM | POA: Diagnosis present

## 2017-05-31 DIAGNOSIS — Z8261 Family history of arthritis: Secondary | ICD-10-CM

## 2017-05-31 HISTORY — DX: Type 2 diabetes mellitus with hyperglycemia: E11.65

## 2017-05-31 LAB — CBC WITH DIFFERENTIAL/PLATELET
BASOS ABS: 0.1 10*3/uL (ref 0–0.1)
BASOS PCT: 1 %
Eosinophils Absolute: 0.2 10*3/uL (ref 0–0.7)
Eosinophils Relative: 3 %
HEMATOCRIT: 47.8 % (ref 40.0–52.0)
Hemoglobin: 16.3 g/dL (ref 13.0–18.0)
Lymphocytes Relative: 12 %
Lymphs Abs: 1 10*3/uL (ref 1.0–3.6)
MCH: 32.3 pg (ref 26.0–34.0)
MCHC: 34.1 g/dL (ref 32.0–36.0)
MCV: 94.7 fL (ref 80.0–100.0)
MONO ABS: 0.4 10*3/uL (ref 0.2–1.0)
MONOS PCT: 5 %
Neutro Abs: 6.6 10*3/uL — ABNORMAL HIGH (ref 1.4–6.5)
Neutrophils Relative %: 79 %
PLATELETS: 155 10*3/uL (ref 150–440)
RBC: 5.05 MIL/uL (ref 4.40–5.90)
RDW: 14.4 % (ref 11.5–14.5)
WBC: 8.3 10*3/uL (ref 3.8–10.6)

## 2017-05-31 LAB — COMPREHENSIVE METABOLIC PANEL
ALK PHOS: 84 U/L (ref 38–126)
ALT: 41 U/L (ref 17–63)
AST: 31 U/L (ref 15–41)
Albumin: 3.7 g/dL (ref 3.5–5.0)
Anion gap: 7 (ref 5–15)
BUN: 17 mg/dL (ref 6–20)
CALCIUM: 9.2 mg/dL (ref 8.9–10.3)
CO2: 23 mmol/L (ref 22–32)
CREATININE: 1.57 mg/dL — AB (ref 0.61–1.24)
Chloride: 108 mmol/L (ref 101–111)
GFR calc Af Amer: 47 mL/min — ABNORMAL LOW (ref >60)
GFR calc non Af Amer: 40 mL/min — ABNORMAL LOW (ref >60)
GLUCOSE: 202 mg/dL — AB (ref 65–99)
Potassium: 3.9 mmol/L (ref 3.5–5.1)
Sodium: 138 mmol/L (ref 135–145)
Total Bilirubin: 1.1 mg/dL (ref 0.3–1.2)
Total Protein: 6.7 g/dL (ref 6.5–8.1)

## 2017-05-31 LAB — GLUCOSE, CAPILLARY
GLUCOSE-CAPILLARY: 108 mg/dL — AB (ref 65–99)
GLUCOSE-CAPILLARY: 138 mg/dL — AB (ref 65–99)

## 2017-05-31 LAB — URINALYSIS, COMPLETE (UACMP) WITH MICROSCOPIC
Bilirubin Urine: NEGATIVE
HGB URINE DIPSTICK: NEGATIVE
Ketones, ur: 5 mg/dL — AB
Leukocytes, UA: NEGATIVE
NITRITE: NEGATIVE
PROTEIN: NEGATIVE mg/dL
RBC / HPF: NONE SEEN RBC/hpf (ref 0–5)
SPECIFIC GRAVITY, URINE: 1.013 (ref 1.005–1.030)
Squamous Epithelial / LPF: NONE SEEN
pH: 5 (ref 5.0–8.0)

## 2017-05-31 LAB — TROPONIN I: Troponin I: <0.03 ng/mL (ref <0.03)

## 2017-05-31 MED ORDER — STROKE: EARLY STAGES OF RECOVERY BOOK
Freq: Once | Status: AC
  Administered 2017-05-31: 17:00:00

## 2017-05-31 MED ORDER — LOSARTAN POTASSIUM 50 MG PO TABS
50.0000 mg | ORAL_TABLET | Freq: Every day | ORAL | Status: DC
  Administered 2017-05-31 – 2017-06-02 (×3): 50 mg via ORAL
  Filled 2017-05-31 (×3): qty 1

## 2017-05-31 MED ORDER — OCUVITE-LUTEIN PO CAPS
1.0000 | ORAL_CAPSULE | Freq: Every day | ORAL | Status: DC
  Administered 2017-06-01 – 2017-06-02 (×2): 1 via ORAL
  Filled 2017-05-31 (×3): qty 1

## 2017-05-31 MED ORDER — HYDROCORTISONE 2 % EX LOTN
1.0000 "application " | TOPICAL_LOTION | CUTANEOUS | Status: DC | PRN
  Filled 2017-05-31: qty 29.6

## 2017-05-31 MED ORDER — SODIUM CHLORIDE 0.9% FLUSH
3.0000 mL | Freq: Two times a day (BID) | INTRAVENOUS | Status: DC
  Administered 2017-05-31 – 2017-06-02 (×4): 3 mL via INTRAVENOUS

## 2017-05-31 MED ORDER — ASPIRIN 81 MG PO CHEW
324.0000 mg | CHEWABLE_TABLET | Freq: Once | ORAL | Status: AC
  Administered 2017-05-31: 324 mg via ORAL
  Filled 2017-05-31: qty 4

## 2017-05-31 MED ORDER — ACETAMINOPHEN 325 MG PO TABS: 650.0000 mg | ORAL_TABLET | ORAL | Status: DC | PRN

## 2017-05-31 MED ORDER — INSULIN ASPART 100 UNIT/ML ~~LOC~~ SOLN
0.0000 [IU] | Freq: Three times a day (TID) | SUBCUTANEOUS | Status: DC
  Administered 2017-06-01: 1 [IU] via SUBCUTANEOUS
  Administered 2017-06-01 (×2): 2 [IU] via SUBCUTANEOUS
  Administered 2017-06-02: 09:00:00 1 [IU] via SUBCUTANEOUS
  Administered 2017-06-02: 3 [IU] via SUBCUTANEOUS
  Filled 2017-05-31 (×5): qty 1

## 2017-05-31 MED ORDER — VITAMIN D 1000 UNITS PO TABS
1000.0000 [IU] | ORAL_TABLET | Freq: Every day | ORAL | Status: DC
  Administered 2017-05-31 – 2017-06-02 (×3): 1000 [IU] via ORAL
  Filled 2017-05-31 (×3): qty 1

## 2017-05-31 MED ORDER — ACETAMINOPHEN 650 MG RE SUPP: 650.0000 mg | RECTAL | Status: DC | PRN

## 2017-05-31 MED ORDER — CANAGLIFLOZIN 100 MG PO TABS
100.0000 mg | ORAL_TABLET | Freq: Every day | ORAL | Status: DC
  Filled 2017-05-31: qty 1

## 2017-05-31 MED ORDER — ROSUVASTATIN CALCIUM 10 MG PO TABS
5.0000 mg | ORAL_TABLET | Freq: Every day | ORAL | Status: DC
  Administered 2017-05-31: 18:00:00 5 mg via ORAL
  Filled 2017-05-31: qty 1

## 2017-05-31 MED ORDER — ACETAMINOPHEN 160 MG/5ML PO SOLN
650.0000 mg | ORAL | Status: DC | PRN
  Filled 2017-05-31: qty 20.3

## 2017-05-31 MED ORDER — FLUTICASONE PROPIONATE 50 MCG/ACT NA SUSP
2.0000 | Freq: Every day | NASAL | Status: DC
  Administered 2017-05-31 – 2017-06-02 (×3): 2 via NASAL
  Filled 2017-05-31: qty 16

## 2017-05-31 MED ORDER — AMLODIPINE BESYLATE 10 MG PO TABS
10.0000 mg | ORAL_TABLET | Freq: Every day | ORAL | Status: DC
  Administered 2017-05-31 – 2017-06-02 (×3): 10 mg via ORAL
  Filled 2017-05-31 (×3): qty 1

## 2017-05-31 MED ORDER — SENNOSIDES-DOCUSATE SODIUM 8.6-50 MG PO TABS: 1.0000 | ORAL_TABLET | Freq: Every evening | ORAL | Status: DC | PRN

## 2017-05-31 MED ORDER — INSULIN ASPART 100 UNIT/ML ~~LOC~~ SOLN: 0.0000 [IU] | Freq: Every day | SUBCUTANEOUS | Status: DC

## 2017-05-31 MED ORDER — ENOXAPARIN SODIUM 40 MG/0.4ML ~~LOC~~ SOLN
40.0000 mg | SUBCUTANEOUS | Status: DC
  Administered 2017-05-31 – 2017-06-01 (×2): 40 mg via SUBCUTANEOUS
  Filled 2017-05-31 (×2): qty 0.4

## 2017-05-31 MED ORDER — TIMOLOL MALEATE 0.5 % OP SOLN
1.0000 [drp] | Freq: Two times a day (BID) | OPHTHALMIC | Status: DC
  Administered 2017-05-31 – 2017-06-02 (×4): 1 [drp] via OPHTHALMIC
  Filled 2017-05-31: qty 5

## 2017-05-31 MED ORDER — LATANOPROST 0.005 % OP SOLN
1.0000 [drp] | Freq: Every day | OPHTHALMIC | Status: DC
  Administered 2017-05-31 – 2017-06-01 (×2): 1 [drp] via OPHTHALMIC
  Filled 2017-05-31: qty 2.5

## 2017-05-31 MED ORDER — ASPIRIN EC 325 MG PO TBEC
325.0000 mg | DELAYED_RELEASE_TABLET | Freq: Every day | ORAL | Status: DC
  Administered 2017-06-01: 13:00:00 325 mg via ORAL
  Filled 2017-05-31: qty 1

## 2017-05-31 NOTE — ED Notes (Signed)
Assisted pt up to go to the bathroom - pt able to ambulate without assist. This nurse did not get urine sample.

## 2017-05-31 NOTE — ED Notes (Signed)
Patient transported to CT 

## 2017-05-31 NOTE — ED Notes (Signed)
Dr putting in diet orders since pt passed swallow eval. Family brought food and pt is eating with no difficulties.

## 2017-05-31 NOTE — ED Triage Notes (Signed)
Patient from home via [facility] complaining of intermittent episodes of weakness, loss of balance and slurred speech with trouble finding words. Reports mornings and nights are usually worse than during the day. States episodes usually last several minutes to hours. Denies any pain. Patient alert and oriented x4.

## 2017-05-31 NOTE — ED Provider Notes (Addendum)
[facility] Emergency Department Provider Note   ____________________________________________   First MD Initiated Contact with Patient 05/31/17 1043     (approximate)  I have reviewed the triage vital signs and the nursing notes.   HISTORY  Chief Complaint Weakness and Aphasia    HPI Anthony Allen is a 81 y.o. male Patient complains of episodes of weakness loss of balance and slurred speech today last minutes to hours started on Thursday.he was having a little bit of it now not very much.   Past Medical History:  Diagnosis Date  . Gout   . Hypertension   . Type 2 diabetes mellitus with hyperglycemia [facility])     Patient Active Problem List   Diagnosis Date Noted  . Fatigue 05/08/2017  . Aortic atherosclerosis ([place]) 05/08/2017  . Glaucoma 05/06/2017  . Lipoma of forehead 11/02/2016  . Type 2 diabetes mellitus with hyperglycemia ([place]) 11/02/2016  . Hyperlipidemia LDL goal <100 11/02/2016  . Constipation in male 11/02/2016  . Insomnia due to anxiety and fear 11/02/2016  . Colon cancer screening 05/04/2015  . Vitamin D deficiency 05/02/2015  . Medicare annual wellness visit, subsequent 06/23/2013  . Essential hypertension, benign 03/22/2013  . Osteoarthritis 03/22/2013  . Numbness and tingling in left hand 03/22/2013  . GERD (gastroesophageal reflux disease) 03/22/2013  . BPH (benign prostatic hyperplasia) 03/22/2013    Past Surgical History:  Procedure Laterality Date  . TONSILLECTOMY      Prior to Admission medications   Medication Sig Start Date End Date Taking? Authorizing Provider  amLODipine (NORVASC) 10 MG tablet Take 1 tablet by mouth daily. 05/03/17   [provider]  Aspirin-Salicylamide-Caffeine (BC HEADACHE POWDER PO) Take 1 packet by mouth as needed.    [provider]  cholecalciferol (VITAMIN D) 1000 units tablet Take 1,000 Units by mouth daily.    [provider]  diphenhydramine-acetaminophen  (TYLENOL PM) 25-500 MG TABS tablet Take 1 tablet by mouth at bedtime as needed.    [provider]  empagliflozin (JARDIANCE) 10 MG TABS tablet Take 10 mg by mouth daily. 05/06/17   Crecencio Mc, MD  fluticasone (FLONASE) 50 MCG/ACT nasal spray Place 2 sprays into both nostrils daily.    [provider]  glucose blood test strip Use as instructed 01/30/17   Crecencio Mc, MD  HYDROCORTISONE, TOPICAL, 2 % LOTN Apply 1 application topically as needed (itching).    [provider]  Lancets Glory Rosebush ULTRASOFT) lancets Use as instructed 01/30/17   Crecencio Mc, MD  latanoprost (XALATAN) 0.005 % ophthalmic solution place 1 drop into right eye at bedtime 07/11/16   [provider]  losartan (COZAAR) 50 MG tablet Take 1 tablet (50 mg total) by mouth daily. 05/09/17   Crecencio Mc, MD  metFORMIN (GLUCOPHAGE) 500 MG tablet Take 500 mg by mouth 2 (two) times daily with a meal. 02/25/17   [provider]  multivitamin-lutein (OCUVITE-LUTEIN) CAPS capsule Take 1 capsule by mouth daily.    [provider]  rosuvastatin (CRESTOR) 5 MG tablet Take 1 tablet (5 mg total) by mouth daily. 05/09/17   Crecencio Mc, MD  SALINE NA Place 2 sprays into both nostrils as needed (allergies).    [provider]  timolol (TIMOPTIC) 0.5 % ophthalmic solution take 1 drop into right eye once daily 07/11/16   [provider]  trolamine salicylate (MYOFLEX) 10 % cream Apply 1 application topically as needed (muscle pain). Reported on 09/06/2015  [provider]    Allergies Patient has no known allergies.  Family History  Problem Relation Age of Onset  . Arthritis Mother   . Stroke Father   . Hypertension Father   . Heart disease Father 67       AMI  . Kidney disease Father        bladder ca congenital loss of kidney  . Arthritis Maternal Grandmother   . Early death Daughter 16       aspiration  . Cancer Brother        Scalp     Social History Social History   Tobacco Use  . Smoking status: Never Smoker  . Smokeless tobacco: Never Used  Substance Use Topics  . Alcohol use: Yes    Comment: occ  . Drug use: No    Review of Systems  Constitutional: No fever/chills Eyes: No visual changes. ENT: No sore throat. Cardiovascular: Denies chest pain. Respiratory: Denies shortness of breath. Gastrointestinal: No abdominal pain.  No nausea, no vomiting.  No diarrhea.  No constipation. Genitourinary: Negative for dysuria. Musculoskeletal: Negative for back pain. Skin: Negative for rash. Neurological:see history of present illness  ____________________________________________   PHYSICAL EXAM:  VITAL SIGNS: ED Triage Vitals  Enc Vitals Group     BP 05/31/17 1043 (!) 183/72     Pulse Rate 05/31/17 1043 64     Resp 05/31/17 1043 14     Temp 05/31/17 1043 97.6 F (36.4 C)     Temp Source 05/31/17 1043 Oral     SpO2 05/31/17 1043 98 %     Weight 05/31/17 1038 160 lb (72.6 kg)     Height 05/31/17 1038 5\' 9"  (1.753 m)     Head Circumference --      Peak Flow --      Pain Score --      Pain Loc --      Pain Edu? --      Excl. in [place]? --     Constitutional: Alert and oriented. Well appearing and in no acute distress. Eyes: Conjunctivae are normal. PERRL. EOMI. Head: Atraumatic chronic fatty tumors on forehead. Nose: No congestion/rhinnorhea. Mouth/Throat: Mucous membranes are moist.  Oropharynx non-erythematous. Neck: No stridor. Cardiovascular: Normal rate, regular rhythm. Grossly normal heart sounds.  Good peripheral circulation. Respiratory: Normal respiratory effort.  No retractions. Lungs CTAB. Gastrointestinal: Soft and nontender. No distention. No abdominal bruits. No CVA tenderness. Musculoskeletal: No lower extremity tenderness nor edema.  No joint effusions. Neurologic:  Normal speech and language.at present pain nerves II through XII are intact cerebellar finger-nose is normal there is a  little slowing in rapid alternating movements on the left side heel to shin is normal bilaterally motor strength is 5 over 5 throughout and sensation is intact of present Skin:  Skin is warm, dry and intact. No rash noted. Psychiatric: Mood and affect are normal. Speech seems very slightly hesitantand behavior are normal.  ____________________________________________   LABS (all labs ordered are listed, but only abnormal results are displayed)  Labs Reviewed  COMPREHENSIVE METABOLIC PANEL - Abnormal; Notable for the following components:      Result Value   Glucose, Bld 202 (*)    Creatinine, Ser 1.57 (*)    GFR calc non Af Amer 40 (*)    GFR calc Af Amer 47 (*)    All other components within normal limits  CBC WITH DIFFERENTIAL/PLATELET - Abnormal; Notable for the following components:   Neutro Abs 6.6 (*)  All other components within normal limits  TROPONIN I  URINALYSIS, COMPLETE (UACMP) WITH MICROSCOPIC   ____________________________________________  EKG  EKG read and interpreted by me shows normal sinus rhythm rate of 66 normal axis no acute ST-T wave changes ____________________________________________  RADIOLOGY  CT shows no acute disease ____________________________________________   PROCEDURES  Procedure(s) performed:  Procedures  Critical Care performed:  ____________________________________________   INITIAL IMPRESSION / ASSESSMENT AND PLAN / ED COURSE patient's history is consistent with a stuttering TIA there is nothing on CT scan which she wouldn't expect if this was a TIA. I will plan on admitting the patient working him up.       ____________________________________________   FINAL CLINICAL IMPRESSION(S) / ED DIAGNOSES  Final diagnoses:  TIA (transient ischemic attack)     ED Discharge Orders    None       Note:  This document was prepared using Dragon voice recognition software and may include unintentional dictation errors.      Nena Polio, MD 05/31/17 1225 ----------------------------------------- 2:31 PM on 05/31/2017 -----------------------------------------  At this time patient's wife comes ounces patient's speech is becoming slurry. You wonder his speech is articulate cleared he is not ataxic E has no weakness or numbness he apparently has had another mini stroke.   Nena Polio, MD 05/31/17 [phone]

## 2017-05-31 NOTE — ED Notes (Signed)
Pt had a bed assigned, but changed to ready to plan. Secretary made aware since the 25 mins up for calling report. Pt's family made aware that the bed was changed.

## 2017-05-31 NOTE — H&P (Signed)
[facility] [place] NAME: Anthony Allen    MR#:  [id number]  DATE OF BIRTH:  Jan 29, 1936  DATE OF ADMISSION:  05/31/2017  PRIMARY CARE PHYSICIAN: Crecencio Mc, MD   REQUESTING/REFERRING PHYSICIAN: Conni Slipper  CHIEF COMPLAINT:  Slurry speech and weakness  HISTORY OF PRESENT ILLNESS:  Anthony Allen  is a 81 y.o. male with a known history of essential hypertension, diabetes medicine happen abdomen is presenting to the ED with a chief complaint of intermittent episodes of weakness associated with slurry speech and loss of balance. Patient denies any headache or blurry vision. Denies any weakness in his extremities. No history of stroke in the past. Takes 81 mg of aspirin on daily basis. CT head is negative in the ED. Hospitalist team is called to admit the patient to get stroke workup. Wife at bedside  PAST MEDICAL HISTORY:   Past Medical History:  Diagnosis Date  . Gout   . Hypertension   . Type 2 diabetes mellitus with hyperglycemia ([place])     PAST SURGICAL HISTOIRY:   Past Surgical History:  Procedure Laterality Date  . TONSILLECTOMY      SOCIAL HISTORY:   Social History   Tobacco Use  . Smoking status: Never Smoker  . Smokeless tobacco: Never Used  Substance Use Topics  . Alcohol use: Yes    Comment: occ    FAMILY HISTORY:   Family History  Problem Relation Age of Onset  . Arthritis Mother   . Stroke Father   . Hypertension Father   . Heart disease Father 20       AMI  . Kidney disease Father        bladder ca congenital loss of kidney  . Arthritis Maternal Grandmother   . Early death Daughter 16       aspiration  . Cancer Brother        Scalp    DRUG ALLERGIES:  No Known Allergies  REVIEW OF SYSTEMS:  CONSTITUTIONAL: No fever, fatigue or weakness.  EYES: No blurred or double vision.  EARS, NOSE, AND THROAT: No tinnitus or ear pain.  RESPIRATORY: No cough, shortness of breath, wheezing  or hemoptysis.  CARDIOVASCULAR: No chest pain, orthopnea, edema.  GASTROINTESTINAL: No nausea, vomiting, diarrhea or abdominal pain.  GENITOURINARY: No dysuria, hematuria.  ENDOCRINE: No polyuria, nocturia,  HEMATOLOGY: No anemia, easy bruising or bleeding SKIN: No rash or lesion. MUSCULOSKELETAL: No joint pain or arthritis.   NEUROLOGIC:slurry speech intermittently  No tingling, numbness, weakness.  PSYCHIATRY: No anxiety or depression.   MEDICATIONS AT HOME:   Prior to Admission medications   Medication Sig Start Date End Date Taking? Authorizing Provider  amLODipine (NORVASC) 10 MG tablet Take 1 tablet by mouth daily. 05/03/17  Yes [provider]  aspirin EC 81 MG tablet Take 81 mg by mouth daily.   Yes [provider]  Aspirin-Salicylamide-Caffeine (BC HEADACHE POWDER PO) Take 1 packet by mouth as needed.   Yes [provider]  cholecalciferol (VITAMIN D) 1000 units tablet Take 1,000 Units by mouth daily.   Yes [provider]  empagliflozin (JARDIANCE) 10 MG TABS tablet Take 10 mg by mouth daily. 05/06/17  Yes Crecencio Mc, MD  fluticasone (FLONASE) 50 MCG/ACT nasal spray Place 2 sprays into both nostrils daily.   Yes [provider]  latanoprost (XALATAN) 0.005 % ophthalmic solution place 1 drop into right eye at bedtime 07/11/16  Yes [provider]  losartan (COZAAR) 50 MG tablet Take 1 tablet (50 mg total) by mouth daily. 05/09/17  Yes Crecencio Mc, MD  multivitamin-lutein [facility]) CAPS capsule Take 1 capsule by mouth daily.   Yes [provider]  rosuvastatin (CRESTOR) 5 MG tablet Take 1 tablet (5 mg total) by mouth daily. 05/09/17  Yes Crecencio Mc, MD  timolol (TIMOPTIC) 0.5 % ophthalmic solution take 1 drop into right eye once daily 07/11/16  Yes [provider]  glucose blood test strip Use as instructed 01/30/17   Crecencio Mc, MD  HYDROCORTISONE, TOPICAL, 2 % LOTN Apply 1 application  topically as needed (itching).    [provider]  Lancets Glory Rosebush ULTRASOFT) lancets Use as instructed 01/30/17   Crecencio Mc, MD      VITAL SIGNS:  Blood pressure (!) 145/80, pulse 65, temperature 97.6 F (36.4 C), temperature source Oral, resp. rate 17, height 5\' 9"  (1.753 m), weight 72.6 kg (160 lb), SpO2 96 %.  PHYSICAL EXAMINATION:  GENIn the bed ERAL:  81 y.o.-year-old patient lying in the bed with no acute distress.  EYES: Pupils equal, round, reactive to light and accommodation. No scleral icterus. Extraocular muscles intact.  HEENT: Head atraumatic, normocephalic. Oropharynx and nasopharynx clear.  NECK:  Supple, no jugular venous distention. No thyroid enlargement, no tenderness.  LUNGS: Normal breath sounds bilaterally, no wheezing, rales,rhonchi or crepitation. No use of accessory muscles of respiration.  CARDIOVASCULAR: S1, S2 normal. No murmurs, rubs, or gallops.  ABDOMEN: Soft, nontender, nondistended. Bowel sounds present. No organomegaly or mass.  EXTREMITIES: No pedal edema, cyanosis, or clubbing.  NEUROLOGIC: Cranial nerves II through XII are intact. Muscle strength 5/5 in all extremities. Sensation intact. Gait not checked.  PSYCHIATRIC: The patient is alert and oriented x 3.  SKIN: No obvious rash, lesion, or ulcer.   LABORATORY PANEL:   CBC Recent Labs  Lab 05/31/17 1044  WBC 8.3  HGB 16.3  HCT 47.8  PLT 155   ------------------------------------------------------------------------------------------------------------------  Chemistries  Recent Labs  Lab 05/31/17 1044  NA 138  K 3.9  CL 108  CO2 23  GLUCOSE 202*  BUN 17  CREATININE 1.57*  CALCIUM 9.2  AST 31  ALT 41  ALKPHOS 84  BILITOT 1.1   ------------------------------------------------------------------------------------------------------------------  Cardiac Enzymes Recent Labs  Lab 05/31/17 1044  TROPONINI <0.03    ------------------------------------------------------------------------------------------------------------------  RADIOLOGY:  Ct Head Wo Contrast  Result Date: 05/31/2017 CLINICAL DATA:  Slurred speech.  Weakness. EXAM: CT HEAD WITHOUT CONTRAST TECHNIQUE: Contiguous axial images were obtained from the base of the skull through the vertex without intravenous contrast. COMPARISON:  None. FINDINGS: Brain: Mild chronic ischemic white matter disease is noted. No mass effect or midline shift is noted. Ventricular size is within normal limits. There is no evidence of mass lesion, hemorrhage or acute infarction. Vascular: No hyperdense vessel or unexpected calcification. Skull: Normal. Negative for fracture or focal lesion. Sinuses/Orbits: Mild left maxillary sinusitis is noted. Other: Probable lipoma is seen in the frontal scalp. IMPRESSION: Mild chronic ischemic white matter disease. No acute intracranial abnormality seen. Electronically Signed   By: Marijo Conception, M.D.   On: 05/31/2017 11:24    EKG:   Orders placed or performed during the hospital encounter of 05/31/17  . ED EKG  . ED EKG  . EKG 12-Lead  . EKG 12-Lead    IMPRESSION AND PLAN:   Anthony Allen  is a 81 y.o. male with a known history of essential hypertension, diabetes medicine happen  abdomen is presenting to the ED with a chief complaint of intermittent episodes of weakness associated with slurry speech and loss of balance. Patient denies any headache or blurry vision. Denies any weakness in his extremities. No history of stroke in the past. Takes 81 mg of aspirin on daily basis. CT head is negative  # TIA-with intermittent episodes of dysarthria Admitted to MedSurg unit Will get complete stroke workup with Carotid Dopplers echocardiogram MRI/MRA of the brain Neurochecks Patient takes aspirin 81 mg at home will give him aspirin 325 mg daily basis Patient passed bedside swallow evaluation CT head is negative Check  fasting lipid panel, TSH and hemoglobin A1c Neurology consult placed PT/OT consult Continue home medication statin  #Essential hypertension Continue home medication Norvasc and Cozaar  #Diabetes mellitus Hold home medication and provide sliding scale insulin  #Hypernatremia continue Crestor Check fasting lipid panel  #Chronic kidney disease Renal function seems to be at his baseline Avoid nephrotoxins and monitor renal function closely  Provide GI prophylaxis and DVT prophylaxis with Lovenox subcutaneous  All the records are reviewed and case discussed with ED provider. Management plans discussed with the patient, family and they are in agreement.  CODE STATUS: FC, wife is hcpoa  TOTAL TIME TAKING CARE OF THIS PATIENT: 42 minutes.   Note: This dictation was prepared with Dragon dictation along with smaller phrase technology. Any transcriptional errors that result from this process are unintentional.  Nicholes Mango M.D on 05/31/2017 at 2:41 PM  Between 7am to 6pm - Pager - [phone]  After 6pm go to [email or web address] - password [email or web address] [facility] [place] [facility]  Office  [phone]  CC: Primary care physician; Crecencio Mc, MD

## 2017-05-31 NOTE — ED Notes (Signed)
Pt's wife stating that pt is slurring his words again. Dr. Cinda Quest to bedside at this time for evaluation.

## 2017-06-01 ENCOUNTER — Observation Stay: Payer: Medicare Other

## 2017-06-01 ENCOUNTER — Observation Stay
Admit: 2017-06-01 | Discharge: 2017-06-01 | Disposition: A | Discharge status: Home / Self Care | Payer: Medicare Other | Attending: Internal Medicine | Admitting: Internal Medicine

## 2017-06-01 ENCOUNTER — Inpatient Hospital Stay: Payer: Medicare Other

## 2017-06-01 DIAGNOSIS — Z8249 Family history of ischemic heart disease and other diseases of the circulatory system: Secondary | ICD-10-CM | POA: Diagnosis not present

## 2017-06-01 DIAGNOSIS — R29702 NIHSS score 2: Secondary | ICD-10-CM | POA: Diagnosis present

## 2017-06-01 DIAGNOSIS — E785 Hyperlipidemia, unspecified: Secondary | ICD-10-CM | POA: Diagnosis present

## 2017-06-01 DIAGNOSIS — R471 Dysarthria and anarthria: Secondary | ICD-10-CM | POA: Diagnosis present

## 2017-06-01 DIAGNOSIS — E1122 Type 2 diabetes mellitus with diabetic chronic kidney disease: Secondary | ICD-10-CM | POA: Diagnosis present

## 2017-06-01 DIAGNOSIS — K219 Gastro-esophageal reflux disease without esophagitis: Secondary | ICD-10-CM | POA: Diagnosis present

## 2017-06-01 DIAGNOSIS — I63532 Cerebral infarction due to unspecified occlusion or stenosis of left posterior cerebral artery: Secondary | ICD-10-CM

## 2017-06-01 DIAGNOSIS — I6930 Unspecified sequelae of cerebral infarction: Secondary | ICD-10-CM | POA: Diagnosis present

## 2017-06-01 DIAGNOSIS — E870 Hyperosmolality and hypernatremia: Secondary | ICD-10-CM | POA: Diagnosis present

## 2017-06-01 DIAGNOSIS — R4701 Aphasia: Secondary | ICD-10-CM | POA: Diagnosis present

## 2017-06-01 DIAGNOSIS — I639 Cerebral infarction, unspecified: Secondary | ICD-10-CM | POA: Diagnosis present

## 2017-06-01 DIAGNOSIS — Z8261 Family history of arthritis: Secondary | ICD-10-CM | POA: Diagnosis not present

## 2017-06-01 DIAGNOSIS — Z8052 Family history of malignant neoplasm of bladder: Secondary | ICD-10-CM | POA: Diagnosis not present

## 2017-06-01 DIAGNOSIS — Z841 Family history of disorders of kidney and ureter: Secondary | ICD-10-CM | POA: Diagnosis not present

## 2017-06-01 DIAGNOSIS — G459 Transient cerebral ischemic attack, unspecified: Secondary | ICD-10-CM | POA: Diagnosis present

## 2017-06-01 DIAGNOSIS — R269 Unspecified abnormalities of gait and mobility: Secondary | ICD-10-CM | POA: Diagnosis present

## 2017-06-01 DIAGNOSIS — R4781 Slurred speech: Secondary | ICD-10-CM | POA: Diagnosis present

## 2017-06-01 DIAGNOSIS — R1310 Dysphagia, unspecified: Secondary | ICD-10-CM | POA: Diagnosis present

## 2017-06-01 DIAGNOSIS — H409 Unspecified glaucoma: Secondary | ICD-10-CM | POA: Diagnosis present

## 2017-06-01 DIAGNOSIS — Z7984 Long term (current) use of oral hypoglycemic drugs: Secondary | ICD-10-CM | POA: Diagnosis not present

## 2017-06-01 DIAGNOSIS — I129 Hypertensive chronic kidney disease with stage 1 through stage 4 chronic kidney disease, or unspecified chronic kidney disease: Secondary | ICD-10-CM | POA: Diagnosis present

## 2017-06-01 DIAGNOSIS — N400 Enlarged prostate without lower urinary tract symptoms: Secondary | ICD-10-CM | POA: Diagnosis present

## 2017-06-01 DIAGNOSIS — Z823 Family history of stroke: Secondary | ICD-10-CM | POA: Diagnosis not present

## 2017-06-01 DIAGNOSIS — M109 Gout, unspecified: Secondary | ICD-10-CM | POA: Diagnosis present

## 2017-06-01 DIAGNOSIS — I700 Atherosclerosis of aorta: Secondary | ICD-10-CM | POA: Diagnosis present

## 2017-06-01 DIAGNOSIS — N189 Chronic kidney disease, unspecified: Secondary | ICD-10-CM | POA: Diagnosis present

## 2017-06-01 LAB — HEMOGLOBIN A1C
HEMOGLOBIN A1C: 6.6 % — AB (ref 4.8–5.6)
Mean Plasma Glucose: 142.72 mg/dL

## 2017-06-01 LAB — TSH: TSH: 1.561 u[IU]/mL (ref 0.350–4.500)

## 2017-06-01 LAB — LIPID PANEL
Cholesterol: 101 mg/dL (ref 0–200)
HDL: 29 mg/dL — ABNORMAL LOW (ref >40)
LDL Cholesterol: 38 mg/dL (ref 0–99)
Total CHOL/HDL Ratio: 3.5 RATIO
Triglycerides: 169 mg/dL — ABNORMAL HIGH (ref <150)
VLDL: 34 mg/dL (ref 0–40)

## 2017-06-01 LAB — GLUCOSE, CAPILLARY
Glucose-Capillary: 148 mg/dL — ABNORMAL HIGH (ref 65–99)
Glucose-Capillary: 160 mg/dL — ABNORMAL HIGH (ref 65–99)
Glucose-Capillary: 197 mg/dL — ABNORMAL HIGH (ref 65–99)
Glucose-Capillary: 136 mg/dL — ABNORMAL HIGH (ref 65–99)

## 2017-06-01 MED ORDER — CLOPIDOGREL BISULFATE 75 MG PO TABS
75.0000 mg | ORAL_TABLET | Freq: Every day | ORAL | Status: DC
  Administered 2017-06-01 – 2017-06-02 (×2): 75 mg via ORAL
  Filled 2017-06-01 (×2): qty 1

## 2017-06-01 MED ORDER — ASPIRIN EC 81 MG PO TBEC
81.0000 mg | DELAYED_RELEASE_TABLET | Freq: Every day | ORAL | Status: DC
  Administered 2017-06-02: 81 mg via ORAL
  Filled 2017-06-01: qty 1

## 2017-06-01 MED ORDER — IOPAMIDOL (ISOVUE-300) INJECTION 61%
60.0000 mL | Freq: Once | INTRAVENOUS | Status: AC | PRN
  Administered 2017-06-01: 14:00:00 60 mL via INTRAVENOUS

## 2017-06-01 MED ORDER — ROSUVASTATIN CALCIUM 20 MG PO TABS
20.0000 mg | ORAL_TABLET | Freq: Every day | ORAL | Status: DC
  Administered 2017-06-02: 20 mg via ORAL
  Filled 2017-06-01: qty 1

## 2017-06-01 NOTE — Consult Note (Signed)
Reason for Consult:progressvie decline  Referring Physician: Dr. Verdell Carmine   CC: progressive gait decline and speech   HPI: Anthony Allen is an 81 y.o. male  with a known history of essential hypertension, diabetes presenting to the ED with a chief complaint of intermittent episodes of weakness associated with slurry speech and loss of balance that has worsened in the past 3 days.  Pt's wife states pt has been deteriorating in the past 2-3 years with dysarthria and gait instability. . Patient denies any headache or blurry vision. Denies any weakness in his extremities. No history of stroke in the past. Takes 81 mg of aspirin on daily basis. CT head is negative in the ED.  Past Medical History:  Diagnosis Date  . Gout   . Hypertension   . Type 2 diabetes mellitus with hyperglycemia [facility])     Past Surgical History:  Procedure Laterality Date  . TONSILLECTOMY      Family History  Problem Relation Age of Onset  . Arthritis Mother   . Stroke Father   . Hypertension Father   . Heart disease Father 53       AMI  . Kidney disease Father        bladder ca congenital loss of kidney  . Arthritis Maternal Grandmother   . Early death Daughter 16       aspiration  . Cancer Brother        Scalp    Social History:  reports that  has never smoked. he has never used smokeless tobacco. He reports that he drinks alcohol. He reports that he does not use drugs.  No Known Allergies  Medications: I have reviewed the patient's current medications.  ROS: History obtained from the patient  General ROS: negative for - chills, fatigue, fever, night sweats, weight gain or weight loss Psychological ROS: negative for - behavioral disorder, hallucinations, memory difficulties, mood swings or suicidal ideation Ophthalmic ROS: negative for - blurry vision, double vision, eye pain or loss of vision ENT ROS: negative for - epistaxis, nasal discharge, oral lesions, sore throat, tinnitus or vertigo Allergy  and Immunology ROS: negative for - hives or itchy/watery eyes Hematological and Lymphatic ROS: negative for - bleeding problems, bruising or swollen lymph nodes Endocrine ROS: negative for - galactorrhea, hair pattern changes, polydipsia/polyuria or temperature intolerance Respiratory ROS: negative for - cough, hemoptysis, shortness of breath or wheezing Cardiovascular ROS: negative for - chest pain, dyspnea on exertion, edema or irregular heartbeat Gastrointestinal ROS: negative for - abdominal pain, diarrhea, hematemesis, nausea/vomiting or stool incontinence Genito-Urinary ROS: negative for - dysuria, hematuria, incontinence or urinary frequency/urgency Musculoskeletal ROS: negative for - joint swelling or muscular weakness Neurological ROS: as noted in HPI Dermatological ROS: negative for rash and skin lesion changes  Physical Examination: Blood pressure 133/72, pulse 72, temperature 97.6 F (36.4 C), temperature source Oral, resp. rate 20, height 5\' 9"  (1.753 m), weight 155 lb 6.8 oz (70.5 kg), SpO2 98 %.   Neurological Examination   Mental Status: Alert, oriented, thought content appropriate.  Dysarthric speech Cranial Nerves: II: Discs flat bilaterally; Visual fields grossly normal, pupils equal, round, reactive to light and accommodation III,IV, VI: ptosis not present, extra-ocular motions intact bilaterally V,VII: smile symmetric, facial light touch sensation normal bilaterally VIII: hearing normal bilaterally IX,X: gag reflex present XI: bilateral shoulder shrug XII: midline tongue extension Motor: Right : Upper extremity   5/5    Left:     Upper extremity   5/5  Lower extremity  5/5     Lower extremity   5/5 Tone and bulk:normal tone throughout; no atrophy noted Sensory: Pinprick and light touch intact throughout, bilaterally Deep Tendon Reflexes: 1+ and symmetric throughout Plantars: Right: downgoing   Left: downgoing Cerebellar: Dysmetria R >L Gait: not tested       Laboratory Studies:   Basic Metabolic Panel: Recent Labs  Lab 05/31/17 1044  NA 138  K 3.9  CL 108  CO2 23  GLUCOSE 202*  BUN 17  CREATININE 1.57*  CALCIUM 9.2    Liver Function Tests: Recent Labs  Lab 05/31/17 1044  AST 31  ALT 41  ALKPHOS 84  BILITOT 1.1  PROT 6.7  ALBUMIN 3.7   No results for input(s): LIPASE, AMYLASE in the last 168 hours. No results for input(s): AMMONIA in the last 168 hours.  CBC: Recent Labs  Lab 05/31/17 1044  WBC 8.3  NEUTROABS 6.6*  HGB 16.3  HCT 47.8  MCV 94.7  PLT 155    Cardiac Enzymes: Recent Labs  Lab 05/31/17 1044  TROPONINI <0.03    BNP: Invalid input(s): POCBNP  CBG: Recent Labs  Lab 05/31/17 1755 05/31/17 2211 06/01/17 0829 06/01/17 1145  GLUCAP 108* 138* 148* 160*    Microbiology: Results for orders placed or performed in visit on 09/02/13  Fecal occult blood, imunochemical     Status: None   Collection Time: 09/02/13  2:59 PM  Result Value Ref Range Status   Fecal Occult Bld Negative Negative Final    Coagulation Studies: No results for input(s): LABPROT, INR in the last 72 hours.  Urinalysis:  Recent Labs  Lab 05/31/17 1803  COLORURINE STRAW*  LABSPEC 1.013  PHURINE 5.0  GLUCOSEU >=500*  HGBUR NEGATIVE  BILIRUBINUR NEGATIVE  KETONESUR 5*  PROTEINUR NEGATIVE  NITRITE NEGATIVE  LEUKOCYTESUR NEGATIVE    Lipid Panel:     Component Value Date/Time   CHOL 101 06/01/2017 0428   TRIG 169 (H) 06/01/2017 0428   HDL 29 (L) 06/01/2017 0428   CHOLHDL 3.5 06/01/2017 0428   VLDL 34 06/01/2017 0428   LDLCALC 38 06/01/2017 0428    HgbA1C:  Lab Results  Component Value Date   HGBA1C 6.6 (H) 06/01/2017    Urine Drug Screen:  No results found for: LABOPIA, COCAINSCRNUR, LABBENZ, AMPHETMU, THCU, LABBARB  Alcohol Level: No results for input(s): ETH in the last 168 hours.  Other results: EKG: normal EKG, normal sinus rhythm, unchanged from previous tracings.  Imaging: Ct Head Wo  Contrast  Result Date: 05/31/2017 CLINICAL DATA:  Slurred speech.  Weakness. EXAM: CT HEAD WITHOUT CONTRAST TECHNIQUE: Contiguous axial images were obtained from the base of the skull through the vertex without intravenous contrast. COMPARISON:  None. FINDINGS: Brain: Mild chronic ischemic white matter disease is noted. No mass effect or midline shift is noted. Ventricular size is within normal limits. There is no evidence of mass lesion, hemorrhage or acute infarction. Vascular: No hyperdense vessel or unexpected calcification. Skull: Normal. Negative for fracture or focal lesion. Sinuses/Orbits: Mild left maxillary sinusitis is noted. Other: Probable lipoma is seen in the frontal scalp. IMPRESSION: Mild chronic ischemic white matter disease. No acute intracranial abnormality seen. Electronically Signed   By: Marijo Conception, M.D.   On: 05/31/2017 11:24   Mr Brain Wo Contrast  Result Date: 06/01/2017 CLINICAL DATA:  TIA. Intermittent weakness and slurred speech with loss of balance. EXAM: MRI HEAD WITHOUT CONTRAST MRA HEAD WITHOUT CONTRAST TECHNIQUE: Multiplanar, multiecho pulse sequences of the brain and  surrounding structures were obtained without intravenous contrast. Angiographic images of the head were obtained using MRA technique without contrast. COMPARISON:  Head CT from yesterday FINDINGS: MRI HEAD FINDINGS Brain: Confluent restricted diffusion in the left pons. Remote right corona radiata infarct with atrophic right midbrain and pons attributed to wallerian changes. Chronic small vessel ischemic gliosis in the cerebral white matter. No hemorrhage, hydrocephalus, or masslike finding. Vascular: Abnormal left vertebral flow void, see below. Skull and upper cervical spine: Negative for marrow lesion. Subgaleal lipoma in the central forehead measuring up to 2.7 cm. Sinuses/Orbits: No acute finding. Mucous retention cyst in the left maxillary sinus. Other: Pre styloid mass in the left parapharyngeal fat  extending to the deep lobe of the parotid. The homogeneous mass measures 4 x 1.2 cm where covered. MRA HEAD FINDINGS The non dominant left vertebral artery is occluded. Moderate proximal basilar narrowing. Fetal type left PCA. Advanced stenosis of the bilateral P2 segments with asymmetric poor flow on the left. high-grade right proximal pica stenosis. Symmetric carotid arteries. Aplastic left A1 segment. Azygos A2 segment. No branch occlusion. Negative for aneurysm. These results were called by telephone at the time of interpretation on 06/01/2017 at 12:59 pm to Dr. Verdell Carmine, who verbally acknowledged these results. IMPRESSION: Brain MRI: 1. Acute left pontine infarct. 2. Remote right corona radiata infarct . 3. 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 Recommend ENT referral if clinically appropriate. Intracranial MRA 1. Left vertebral occlusion that is age indeterminate. 2. Advanced intracranial atherosclerosis. There is high-grade right PICA and bilateral P2 segment stenoses. Moderate mid basilar stenosis. Electronically Signed   By: Monte Fantasia M.D.   On: 06/01/2017 13:01   US Carotid Bilateral (at [facility] Only)  Result Date: 06/01/2017 CLINICAL DATA:  TIA, hypertension and diabetes. EXAM: BILATERAL CAROTID DUPLEX ULTRASOUND TECHNIQUE: Pearline Cables scale imaging, color Doppler and duplex ultrasound were performed of bilateral carotid and vertebral arteries in the neck. COMPARISON:  None. FINDINGS: Criteria: Quantification of carotid stenosis is based on velocity parameters that correlate the residual internal carotid diameter with NASCET-based stenosis levels, using the diameter of the distal internal carotid lumen as the denominator for stenosis measurement. The following velocity measurements were obtained: RIGHT ICA:  71/20 cm/sec CCA:  07/37 cm/sec SYSTOLIC ICA/CCA RATIO:  0.8 DIASTOLIC ICA/CCA RATIO:  1.8 ECA:  143 cm/sec LEFT ICA:  78/9 cm/sec CCA:  10/6  cm/sec SYSTOLIC ICA/CCA RATIO:  0.9 DIASTOLIC ICA/CCA RATIO:  3 ECA:  136 cm/sec RIGHT CAROTID ARTERY: There is a mild amount of calcified plaque at the level of the carotid bulb and proximal right ICA. Estimated right ICA stenosis is less than 50%. RIGHT VERTEBRAL ARTERY: Antegrade flow with normal waveform and velocity. LEFT CAROTID ARTERY: Minimal amount of partially calcified plaque is present at the level of the carotid bulb. This approaches the ICA origin. Estimated left ICA stenosis is less than 50%. LEFT VERTEBRAL ARTERY: Antegrade flow with normal waveform and velocity. IMPRESSION: Mild amount of calcified plaque at the level of the right carotid bulb and proximal right ICA. Minimal plaque at the level of the left carotid bulb and left ICA origin. Estimated bilateral ICA stenoses are less than 50%. Electronically Signed   By: Aletta Edouard M.D.   On: 06/01/2017 09:05   Mr Jodene Nam Head/brain YI Cm  Result Date: 06/01/2017 CLINICAL DATA:  TIA. Intermittent weakness and slurred speech with loss of balance. EXAM: MRI HEAD WITHOUT CONTRAST MRA HEAD WITHOUT CONTRAST TECHNIQUE:  Multiplanar, multiecho pulse sequences of the brain and surrounding structures were obtained without intravenous contrast. Angiographic images of the head were obtained using MRA technique without contrast. COMPARISON:  Head CT from yesterday FINDINGS: MRI HEAD FINDINGS Brain: Confluent restricted diffusion in the left pons. Remote right corona radiata infarct with atrophic right midbrain and pons attributed to wallerian changes. Chronic small vessel ischemic gliosis in the cerebral white matter. No hemorrhage, hydrocephalus, or masslike finding. Vascular: Abnormal left vertebral flow void, see below. Skull and upper cervical spine: Negative for marrow lesion. Subgaleal lipoma in the central forehead measuring up to 2.7 cm. Sinuses/Orbits: No acute finding. Mucous retention cyst in the left maxillary sinus. Other: Pre styloid mass in the  left parapharyngeal fat extending to the deep lobe of the parotid. The homogeneous mass measures 4 x 1.2 cm where covered. MRA HEAD FINDINGS The non dominant left vertebral artery is occluded. Moderate proximal basilar narrowing. Fetal type left PCA. Advanced stenosis of the bilateral P2 segments with asymmetric poor flow on the left. high-grade right proximal pica stenosis. Symmetric carotid arteries. Aplastic left A1 segment. Azygos A2 segment. No branch occlusion. Negative for aneurysm. These results were called by telephone at the time of interpretation on 06/01/2017 at 12:59 pm to Dr. Verdell Carmine, who verbally acknowledged these results. IMPRESSION: Brain MRI: 1. Acute left pontine infarct. 2. Remote right corona radiata infarct . 3. 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 Recommend ENT referral if clinically appropriate. Intracranial MRA 1. Left vertebral occlusion that is age indeterminate. 2. Advanced intracranial atherosclerosis. There is high-grade right PICA and bilateral P2 segment stenoses. Moderate mid basilar stenosis. Electronically Signed   By: Monte Fantasia M.D.   On: 06/01/2017 13:01     Assessment/Plan:  81 y.o. male  with a known history of essential hypertension, diabetes presenting to the ED with a chief complaint of intermittent episodes of weakness associated with slurry speech and loss of balance that has worsened in the past 3 days.  Pt's wife states pt has been deteriorating in the past 2-3 years with dysarthria and gait instability. . Patient denies any headache or blurry vision. Denies any weakness in his extremities. No history of stroke in the past. Takes 81 mg of aspirin on daily basis. CT head is negative in the ED.  - Pt has posterior circulation L pontine stroke and significant intracranial stenosis worst in posterior circulation  - On ASA 81 mg at home.  Will add plavix 75 daily - statin - pt/ot  - Besides the stroke pt  does likely have progressive neurodegenerative process. Don't think this is Parkinsonian related as normal tone.  - Needs follow up with neurology as out pt.    06/01/2017, 2:36 PM

## 2017-06-01 NOTE — Progress Notes (Signed)
Pt demonstrated swallowing difficulty with diet change and changed to nectar thick liquids with ed done with pt/wife. Pt continues to have fluctuations of clearer speech to more slurred speech. Favors use of right hand/arm with strength good. MRI/MRA results reviewed with Dr. Verdell Carmine in with pt/wife updated. MD advised pt he would need ENT referral when discharged and advised he had a pon stroke. Up in chair eating slowly without difficulty; wife reports pt has difficulty feeding self in that he misses his mouth.

## 2017-06-01 NOTE — Progress Notes (Signed)
[facility] [place] NAME: Anthony Allen    MR#:  [id number]  DATE OF BIRTH:  May 08, 1936  SUBJECTIVE:   Patient here due to slurred speech. Patient has had intermittent episodes of slurred speech. This morning after ultrasound patient developed worsening slurred speech which has improved. Denies any focal numbness or weakness. Patient's wife is at bedside.    REVIEW OF SYSTEMS:    Review of Systems  Constitutional: Negative for chills and fever.  HENT: Negative for congestion and tinnitus.   Eyes: Negative for blurred vision and double vision.  Respiratory: Negative for cough, shortness of breath and wheezing.   Cardiovascular: Negative for chest pain, orthopnea and PND.  Gastrointestinal: Negative for abdominal pain, diarrhea, nausea and vomiting.  Genitourinary: Negative for dysuria and hematuria.  Neurological: Positive for weakness. Negative for dizziness, sensory change and focal weakness.  All other systems reviewed and are negative.   Nutrition: Heart Healthy/Carb modified Tolerating Diet: Yes Tolerating PT: Await Eval.   DRUG ALLERGIES:  No Known Allergies  VITALS:  Blood pressure (!) 143/49, pulse 65, temperature (!) 97.4 F (36.3 C), temperature source Oral, resp. rate 20, height 5\' 9"  (1.753 m), weight 70.5 kg (155 lb 6.8 oz), SpO2 93 %.  PHYSICAL EXAMINATION:   Physical Exam  GENERAL:  81 y.o.-year-old patient lying in bed in no acute distress.  EYES: Pupils equal, round, reactive to light and accommodation. No scleral icterus. Extraocular muscles intact.  HEENT: Head atraumatic, normocephalic. Oropharynx and nasopharynx clear.  NECK:  Supple, no jugular venous distention. No thyroid enlargement, no tenderness.  LUNGS: Normal breath sounds bilaterally, no wheezing, rales, rhonchi. No use of accessory muscles of respiration.  CARDIOVASCULAR: S1, S2 normal. No murmurs, rubs, or gallops.  ABDOMEN: Soft, nontender,  nondistended. Bowel sounds present. No organomegaly or mass.  EXTREMITIES: No cyanosis, clubbing or edema b/l.    NEUROLOGIC: Cranial nerves II through XII are intact. No focal Motor or sensory deficits b/l.   PSYCHIATRIC: The patient is alert and oriented x 3.  SKIN: No obvious rash, lesion, or ulcer.    LABORATORY PANEL:   CBC Recent Labs  Lab 05/31/17 1044  WBC 8.3  HGB 16.3  HCT 47.8  PLT 155   ------------------------------------------------------------------------------------------------------------------  Chemistries  Recent Labs  Lab 05/31/17 1044  NA 138  K 3.9  CL 108  CO2 23  GLUCOSE 202*  BUN 17  CREATININE 1.57*  CALCIUM 9.2  AST 31  ALT 41  ALKPHOS 84  BILITOT 1.1   ------------------------------------------------------------------------------------------------------------------  Cardiac Enzymes Recent Labs  Lab 05/31/17 1044  TROPONINI <0.03   ------------------------------------------------------------------------------------------------------------------  RADIOLOGY:  Ct Head Wo Contrast  Result Date: 05/31/2017 CLINICAL DATA:  Slurred speech.  Weakness. EXAM: CT HEAD WITHOUT CONTRAST TECHNIQUE: Contiguous axial images were obtained from the base of the skull through the vertex without intravenous contrast. COMPARISON:  None. FINDINGS: Brain: Mild chronic ischemic white matter disease is noted. No mass effect or midline shift is noted. Ventricular size is within normal limits. There is no evidence of mass lesion, hemorrhage or acute infarction. Vascular: No hyperdense vessel or unexpected calcification. Skull: Normal. Negative for fracture or focal lesion. Sinuses/Orbits: Mild left maxillary sinusitis is noted. Other: Probable lipoma is seen in the frontal scalp. IMPRESSION: Mild chronic ischemic white matter disease. No acute intracranial abnormality seen. Electronically Signed   By: Marijo Conception, M.D.   On: 05/31/2017 11:24   US Carotid  Bilateral (at [facility]  Only)  Result Date: 06/01/2017 CLINICAL DATA:  TIA, hypertension and diabetes. EXAM: BILATERAL CAROTID DUPLEX ULTRASOUND TECHNIQUE: Pearline Cables scale imaging, color Doppler and duplex ultrasound were performed of bilateral carotid and vertebral arteries in the neck. COMPARISON:  None. FINDINGS: Criteria: Quantification of carotid stenosis is based on velocity parameters that correlate the residual internal carotid diameter with NASCET-based stenosis levels, using the diameter of the distal internal carotid lumen as the denominator for stenosis measurement. The following velocity measurements were obtained: RIGHT ICA:  71/20 cm/sec CCA:  37/10 cm/sec SYSTOLIC ICA/CCA RATIO:  0.8 DIASTOLIC ICA/CCA RATIO:  1.8 ECA:  143 cm/sec LEFT ICA:  78/9 cm/sec CCA:  62/6 cm/sec SYSTOLIC ICA/CCA RATIO:  0.9 DIASTOLIC ICA/CCA RATIO:  3 ECA:  136 cm/sec RIGHT CAROTID ARTERY: There is a mild amount of calcified plaque at the level of the carotid bulb and proximal right ICA. Estimated right ICA stenosis is less than 50%. RIGHT VERTEBRAL ARTERY: Antegrade flow with normal waveform and velocity. LEFT CAROTID ARTERY: Minimal amount of partially calcified plaque is present at the level of the carotid bulb. This approaches the ICA origin. Estimated left ICA stenosis is less than 50%. LEFT VERTEBRAL ARTERY: Antegrade flow with normal waveform and velocity. IMPRESSION: Mild amount of calcified plaque at the level of the right carotid bulb and proximal right ICA. Minimal plaque at the level of the left carotid bulb and left ICA origin. Estimated bilateral ICA stenoses are less than 50%. Electronically Signed   By: Aletta Edouard M.D.   On: 06/01/2017 09:05     ASSESSMENT AND PLAN:   81 year old male with past medical history of hypertension, gout, diabetes who presented to the hospital due to slurred speech/aphasia.  1. Slurred speech/aphasia-suspect be secondary to a TIA/CVA. CT head although is negative for  acute pathology. -Patient has no other focal neurologic symptoms. Await MRI/MRA of the brain. Carotid duplex shows no hemodynamically significant carotid artery stenosis. Await echocardiogram results. -Continue aspirin, will raise the dose of Crestor. -Await neurological consultation.  2. Essential hypertension-continue Norvasc, losartan.  3. Hyperlipidemia-will resume Crestor but raise the dose given suspected TIA/CVA.  4. Glaucoma-continue latanoprost, timolol eyedrops.  5. Diabetes type 2 without complication-continue sliding scale insulin. Follow blood sugars.   All the records are reviewed and case discussed with Care Management/Social Worker. Management plans discussed with the patient, family and they are in agreement.  CODE STATUS: Full code  DVT Prophylaxis: Lovenox  TOTAL TIME TAKING CARE OF THIS PATIENT: 30 minutes.   POSSIBLE D/C IN 1-2 DAYS, DEPENDING ON CLINICAL CONDITION.   Henreitta Leber M.D on 06/01/2017 at 11:48 AM  Between 7am to 6pm - Pager - [phone]  After 6pm go to [email or web address] - Proofreader  [facility]  Office  [phone]  CC: Primary care physician; Crecencio Mc, MD

## 2017-06-01 NOTE — Care Management Obs Status (Signed)
[place] NOTIFICATION   Patient Details  Name: Anthony Allen MRN: [id number] Date of Birth: 10-12-1935   Medicare Observation Status Notification Given:  Other (see comment)  No letter given yet. Wife upset, patient going for immediate MRI per increased CVA symptoms. Will approach wife later.     Kory Rains A, RN 06/01/2017, 9:49 AM

## 2017-06-01 NOTE — Progress Notes (Signed)
TC from [place] tech reports pt broke out in "sweat" and ask that we assess pt on return to unit. Pt returned to unit at approximately 0829 with FSBS done, VSS. Pt has scared, wanding expression; speech very slurred and having difficulty finding words. Unable to definitely confirm right facial droop. Favored right hand use but strength good. NIH- 2. Dr. Verdell Carmine notified and immediately in room to assess pt with symptoms improved:he  has called MRI to be done asap. Wife at bedside. After RN left room, wife tried to give pt water with straw with pt becoming strangled; coughing repeatedly. Advised pt and wife to stay NPO until swallow eval can be done. TC with Curt Bears, ST and she will see pt this afternoon.

## 2017-06-01 NOTE — Evaluation (Addendum)
Clinical/Bedside Swallow Evaluation Patient Details  Name: Anthony Allen MRN: [id number] Date of Birth: 1936-05-12  Today's Date: 06/01/2017 Time: SLP Start Time (ACUTE ONLY): 1030 SLP Stop Time (ACUTE ONLY): 1130 SLP Time Calculation (min) (ACUTE ONLY): 60 min  Past Medical History:  Past Medical History:  Diagnosis Date  . Gout   . Hypertension   . Type 2 diabetes mellitus with hyperglycemia [facility])    Past Surgical History:  Past Surgical History:  Procedure Laterality Date  . TONSILLECTOMY     HPI:    Anthony Allen is an 81 y.o. male  with a known history of essential hypertension, diabetes presenting to the ED with a chief complaint of intermittent episodes of weakness associated with slurry speech and loss of balance that has worsened in the past 3 days.  Anthony Allen's Anthony Allen states Anthony Allen has been deteriorating in the past 2-3 years with dysarthria and gait instability; Cognition. Patient denies any headache or blurry vision. Denies any weakness in his extremities. No history of stroke in the past.  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 Anthony Allen did indicated some swallowing issues at home intermittently but was not able to describe incidents.   Assessment / Plan / Recommendation Clinical Impression  Anthony Allen appears to present w/ oropharyngeal phase dysphagia w/ noted pharyngeal phase dysphagia w/ thin liquids - Anthony Allen exhibited overt coughing w/ trials of thin liquids as he did this AM w/ Anthony Allen. Anthony Allen appeared to adequately tolerate trials of Nectar liquids and purees/softened solids w/ no overt s/s of aspiration; adequate oral phase management of boluses. MD consulted and agreed w/ initiation of diet; precautions posted; Anthony Allen and Anthony Allen educated. Anthony Allen would benefit from a dysphagia diet w/ Nectar consistency liquids w/ objective swallow study to further assess swallow function b/f a diet upgrade (of  liquids). The level 2 foods would allow for easier mastication and conservation of energy at this time. Anthony Allen updated and agreed. NSG updated.  SLP Visit Diagnosis: Dysphagia, oropharyngeal phase (R13.12);Dysphagia, pharyngeal phase (R13.13)    Aspiration Risk  Mild aspiration risk;Moderate aspiration risk    Diet Recommendation  Dysphagia level 2(minced) w/ Nectar consistency liquids; aspiration precautions; feeding support at meals d/t RUE weakness/discordination  Medication Administration: Whole meds with puree(as able or crushed if needed for safer swallowing)    Other  Recommendations Recommended Consults: (Dietician f/u) Oral Care Recommendations: Oral care BID;Staff/trained caregiver to provide oral care Other Recommendations: Order thickener from pharmacy;Prohibited food (jello, ice cream, thin soups);Remove water pitcher;Have oral suction available   Follow up Recommendations Skilled Nursing facility;Home health SLP(TBD)      Frequency and Duration min 3x week  2 weeks       Prognosis Prognosis for Safe Diet Advancement: Fair Barriers to Reach Goals: Cognitive deficits(motor deficits baseline per Anthony Allen)      Swallow Study   General Date of Onset: 05/31/17 Type of Study: Bedside Swallow Evaluation Previous Swallow Assessment: none reported Diet Prior to this Study: Regular;Thin liquids(previously at home) Temperature Spikes Noted: No(wbc 8.3) Respiratory Status: Room air History of Recent Intubation: No Behavior/Cognition: Alert;Cooperative;Pleasant mood;Distractible;Requires cueing(min) Oral Cavity Assessment: Dry(min) Oral Care Completed by SLP: Recent completion by staff Oral Cavity - Dentition: Adequate natural dentition;Missing dentition(just few) Vision: Functional for self-feeding Self-Feeding Abilities: Able to feed self;Needs assist;Needs set up(RUE weakness - min) Patient Positioning: Upright in bed Baseline Vocal Quality: Low vocal intensity(speech slightly  muttered) Volitional Cough: Strong Volitional Swallow: Able to  elicit    Oral/Motor/Sensory Function Overall Oral Motor/Sensory Function: (no gross weakness(unilateral) noted)   Ice Chips Ice chips: Impaired Presentation: Spoon(fed; 2 trials) Oral Phase Impairments: (none) Oral Phase Functional Implications: (none) Pharyngeal Phase Impairments: Cough - Delayed(x1/2)   Thin Liquid Thin Liquid: Impaired Presentation: Cup;Self Fed(3 trials) Oral Phase Impairments: (none) Oral Phase Functional Implications: (none) Pharyngeal  Phase Impairments: Cough - Immediate(x1/2 trials) Other Comments: Anthony Allen appeared to feed self slowly w/ delayed movements    Nectar Thick Nectar Thick Liquid: Within functional limits Presentation: Cup;Self Fed(~2-3 ozs) Other Comments: Anthony Allen appeared to feed self slowly w/ delayed movements   Honey Thick Honey Thick Liquid: Not tested   Puree Puree: Within functional limits Presentation: Self Fed;Spoon(8 trials) Other Comments: Anthony Allen appeared to feed self slowly w/ delayed movements   Solid   GO   Solid: Impaired Presentation: Self Fed(5 trials) Oral Phase Impairments: Impaired mastication(slow, deliberate) Oral Phase Functional Implications: Impaired mastication Pharyngeal Phase Impairments: (none) Other Comments: Anthony Allen appeared to feed self slowly w/ delayed movements    Functional Assessment Tool Used: clinical judgement Functional Limitations: Swallowing Swallow Current Status (T6256): At least 20 percent but less than 40 percent impaired, limited or restricted Swallow Goal Status [phone]): At least 1 percent but less than 20 percent impaired, limited or restricted Swallow Discharge Status [id number]): At least 1 percent but less than 20 percent impaired, limited or restricted     Orinda Kenner, MS, CCC-SLP Bridgid Printz 06/01/2017,11:49 AM

## 2017-06-01 NOTE — Evaluation (Signed)
Physical Therapy Evaluation Patient Details Name: Anthony Allen MRN: [id number] DOB: December 09, 1935 Today's Date: 06/01/2017   History of Present Illness  Pt is an 81 y.o.malewith a known history of essential hypertension, diabetes presenting to the ED with a chief complaint of intermittent episodes of weakness associated with slurry speech and loss of balancethat has worsened in the past 3 days. Pt's wife states pt has been deteriorating in the past 2-3 years with dysarthria and gait instability. Patient denies any headache or blurry vision. Denies any weakness in his extremities. No history of stroke in the past.  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     Clinical Impression  Pt presents with mild deficits in RLE strength, transfers, gait, balance, and activity tolerance as well as mild RLE ataxia during amb.  No deficits noted to UE strength or coordination with pt doing well with RAMs, FTN test, and sequential thumb to finger opposition with BUEs.   Pt did present with min instability during amb with mild RLE ataxia but was able to amb 1 x 50' and 1 x 125' without LOB.  No deficits in visual field or visual tracking noted.  Pt will benefit from HHPT services upon discharge to safely address above deficits for decreased caregiver assistance and eventual return to PLOF.      Follow Up Recommendations Home health PT    Equipment Recommendations  Rolling walker with 5" wheels    Recommendations for Other Services       Precautions / Restrictions Precautions Precautions: Fall Restrictions Weight Bearing Restrictions: No      Mobility  Bed Mobility Overal bed mobility: Independent             General bed mobility comments: Good control and effort with bed mobility tasks with no assistance required  Transfers Overall transfer level: Needs  assistance Equipment used: Rolling walker (2 wheeled) Transfers: Sit to/from Stand Sit to Stand: Min guard         General transfer comment: Good eccentric and concentric control with transfers with fair stability  Ambulation/Gait Ambulation/Gait assistance: Min guard Ambulation Distance (Feet): 125 Feet Assistive device: Rolling walker (2 wheeled) Gait Pattern/deviations: Step-through pattern;Decreased step length - right;Decreased step length - left;Ataxic     General Gait Details: Slow cadence with amb with mildly ataxic RLE but no LOB during session  Stairs            Wheelchair Mobility    Modified Rankin (Stroke Patients Only)       Balance Overall balance assessment: Needs assistance Sitting-balance support: Feet supported;Feet unsupported;No upper extremity supported Sitting balance-Leahy Scale: Good     Standing balance support: Bilateral upper extremity supported;No upper extremity supported Standing balance-Leahy Scale: Fair                               Pertinent Vitals/Pain Pain Assessment: No/denies pain    Home Living Family/patient expects to be discharged to:: Private residence Living Arrangements: Spouse/significant other Available Help at Discharge: Family;Available 24 hours/day Type of Home: House Home Access: Ramped entrance     Home Layout: One level Home Equipment: None      Prior Function Level of Independence: Independent         Comments: Ind amb without AD, no fall history, Ind with ADLs     Hand Dominance   Dominant  Hand: Right    Extremity/Trunk Assessment   Upper Extremity Assessment Upper Extremity Assessment: Overall WFL for tasks assessed(No significant deficits in RAM, sequential thumb to finger opposition, or FTN test R vs L)    Lower Extremity Assessment Lower Extremity Assessment: Generalized weakness;RLE deficits/detail RLE Deficits / Details: RLE with mild strength deficits compared to  LLE with mild ataxia during ambulation       Communication   Communication: Expressive difficulties(Mild dysarthria)  Cognition Arousal/Alertness: Awake/alert Behavior During Therapy: WFL for tasks assessed/performed Overall Cognitive Status: Within Functional Limits for tasks assessed                                        General Comments      Exercises Total Joint Exercises Ankle Circles/Pumps: AROM;Both;10 reps Long Arc Quad: AROM;Both;10 reps Knee Flexion: AROM;Both;10 reps Marching in Standing: AROM;Both;10 reps Other Exercises Other Exercises: Seated B hip flex x 10 Other Exercises: Static standing balance training without UE support with feet apart with eyes open and closed and with head still and head turns with fair stability grossly   Assessment/Plan    PT Assessment Patient needs continued PT services  PT Problem List Decreased strength;Decreased activity tolerance;Decreased balance       PT Treatment Interventions DME instruction;Gait training;Functional mobility training;Neuromuscular re-education;Balance training;Therapeutic exercise;Therapeutic activities;Patient/family education    PT Goals (Current goals can be found in the Care Plan section)  Acute Rehab PT Goals Patient Stated Goal: To walk better PT Goal Formulation: With patient Time For Goal Achievement: 06/14/17 Potential to Achieve Goals: Fair    Frequency 7X/week   Barriers to discharge        Co-evaluation               AM-PAC PT "6 Clicks" Daily Activity  Outcome Measure Difficulty turning over in bed (including adjusting bedclothes, sheets and blankets)?: None Difficulty moving from lying on back to sitting on the side of the bed? : None Difficulty sitting down on and standing up from a chair with arms (e.g., wheelchair, bedside commode, etc,.)?: Unable Help needed moving to and from a bed to chair (including a wheelchair)?: A Little Help needed walking in  hospital room?: A Little Help needed climbing 3-5 steps with a railing? : A Little 6 Click Score: 18    End of Session Equipment Utilized During Treatment: Gait belt Activity Tolerance: Patient tolerated treatment well Patient left: in chair;with call bell/phone within reach;with chair alarm set;with family/visitor present Nurse Communication: Mobility status;Other (comment)(Chair alarm may not be functioning properly, nursing notified) PT Visit Diagnosis: Ataxic gait (R26.0);Muscle weakness (generalized) (M62.81);Difficulty in walking, not elsewhere classified (R26.2)    Time: 2841-3244 PT Time Calculation (min) (ACUTE ONLY): 43 min   Charges:   PT Evaluation $PT Eval Moderate Complexity: 1 Mod PT Treatments $Therapeutic Exercise: 8-22 mins   PT G Codes:   PT G-Codes **NOT FOR INPATIENT CLASS** Functional Assessment Tool Used: AM-PAC 6 Clicks Basic Mobility Functional Limitation: Mobility: Walking and moving around Mobility: Walking and Moving Around Current Status (W1027): At least 40 percent but less than 60 percent impaired, limited or restricted Mobility: Walking and Moving Around Goal Status [phone]): At least 1 percent but less than 20 percent impaired, limited or restricted    D. Royetta Asal PT, DPT 06/01/17, 5:44 PM

## 2017-06-02 ENCOUNTER — Other Ambulatory Visit: Payer: Medicare Other

## 2017-06-02 ENCOUNTER — Other Ambulatory Visit: Payer: Self-pay | Admitting: Internal Medicine

## 2017-06-02 DIAGNOSIS — I63532 Cerebral infarction due to unspecified occlusion or stenosis of left posterior cerebral artery: Secondary | ICD-10-CM

## 2017-06-02 LAB — GLUCOSE, CAPILLARY
GLUCOSE-CAPILLARY: 128 mg/dL — AB (ref 65–99)
Glucose-Capillary: 220 mg/dL — ABNORMAL HIGH (ref 65–99)

## 2017-06-02 LAB — ECHOCARDIOGRAM COMPLETE
HEIGHTINCHES: 69 in
WEIGHTICAEL: 2486.79 [oz_av]

## 2017-06-02 MED ORDER — CLOPIDOGREL BISULFATE 75 MG PO TABS: 75.0000 mg | ORAL_TABLET | Freq: Every day | ORAL | 0 refills | Status: DC

## 2017-06-02 MED ORDER — ROSUVASTATIN CALCIUM 20 MG PO TABS: 20.0000 mg | ORAL_TABLET | Freq: Every day | ORAL | 1 refills | Status: DC

## 2017-06-02 NOTE — Evaluation (Signed)
Occupational Therapy Evaluation Patient Details Name: Anthony Allen MRN: [id number] DOB: December 03, 1935 Today's Date: 06/02/2017    History of Present Illness Pt is an 81 y.o.malewith a known history of essential hypertension, diabetes presenting to the ED with a chief complaint of intermittent episodes of weakness associated with slurry speech and loss of balancethat has worsened in the past 3 days. Pt's wife states pt has been deteriorating in the past 2-3 years with dysarthria and gait instability. Patient denies any headache or blurry vision. Denies any weakness in his extremities. No history of stroke in the past.  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    Clinical Impression   Patient seen for OT evaluation this date.  Patient presents with right sided muscle weakness, decreased grip, pinch and coordination skills which affect his ability to perform self care tasks.  He has expressive aphasia.  He lives with his wife and son at home in a one story home.  He would benefit from skilled OT to maximize his safety and independence in daily tasks.  Recommend home health OT upon discharge to further address right UE deficits.      Follow Up Recommendations  Home health OT    Equipment Recommendations       Recommendations for Other Services       Precautions / Restrictions Precautions Precautions: Fall      Mobility Bed Mobility                  Transfers     Transfers: Sit to/from Stand Sit to Stand: Min guard              Balance Overall balance assessment: Needs assistance Sitting-balance support: Feet supported;Feet unsupported;No upper extremity supported Sitting balance-Leahy Scale: Good     Standing balance support: Bilateral upper extremity supported;No upper extremity supported Standing balance-Leahy Scale: Fair                             ADL either performed or assessed with clinical judgement   ADL Overall ADL's : Needs assistance/impaired Eating/Feeding: Set up Eating/Feeding Details (indicate cue type and reason): assist with cutting food, difficulty with holding drink in right hand Grooming: Set up;Minimal assistance Grooming Details (indicate cue type and reason): difficulty with shaving, combing hair.  Upper Body Bathing: Set up;Minimal assistance   Lower Body Bathing: Set up;Minimal assistance   Upper Body Dressing : Minimal assistance   Lower Body Dressing: Minimal assistance Lower Body Dressing Details (indicate cue type and reason): difficulty with pinching socks to hold to put on foot Toilet Transfer: Min guard           Functional mobility during ADLs: Min guard       Vision Patient Visual Report: No change from baseline       Perception     Praxis      Pertinent Vitals/Pain Pain Assessment: No/denies pain     Hand Dominance Right   Extremity/Trunk Assessment Upper Extremity Assessment Upper Extremity Assessment: Generalized weakness;RUE deficits/detail RUE Deficits / Details: Pt demonstrates weakness on right side UE, decreased gross and fine motor coordination skills as well as decreased grip and pinch which is affecting his ability to perform self care tasks.  RUE Coordination: decreased fine motor;decreased gross motor   Lower Extremity Assessment Lower Extremity Assessment: Defer to PT evaluation  Communication Communication Communication: Expressive difficulties   Cognition Arousal/Alertness: Awake/alert Behavior During Therapy: WFL for tasks assessed/performed Overall Cognitive Status: Within Functional Limits for tasks assessed                                     General Comments       Exercises     Shoulder Instructions      Home Living Family/patient expects to be discharged to:: Private residence Living  Arrangements: Spouse/significant other Available Help at Discharge: Family;Available 24 hours/day Type of Home: House Home Access: Ramped entrance     Home Layout: One level     Bathroom Shower/Tub: Walk-in Hydrologist: Standard Bathroom Accessibility: Yes   Home Equipment: None          Prior Functioning/Environment Level of Independence: Independent        Comments: Ind amb without AD, no fall history, Ind with ADLs, was still driving, performing yardwork (7 acres) and able to perform laundry        OT Problem List: Decreased strength;Impaired balance (sitting and/or standing);Decreased range of motion;Decreased coordination;Decreased knowledge of use of DME or AE;Impaired UE functional use      OT Treatment/Interventions: Self-care/ADL training;DME and/or AE instruction;Therapeutic activities;Balance training;Therapeutic exercise;Neuromuscular education;Patient/family education    OT Goals(Current goals can be found in the care plan section) Acute Rehab OT Goals Patient Stated Goal: to be able to go home and take care of myself OT Goal Formulation: With patient Time For Goal Achievement: 06/07/17 Potential to Achieve Goals: Good ADL Goals Pt Will Perform Grooming: with modified independence Pt Will Perform Lower Body Dressing: with modified independence  OT Frequency: Min 1X/week   Barriers to D/C:            Co-evaluation              AM-PAC PT "6 Clicks" Daily Activity     Outcome Measure Help from another person eating meals?: A Little Help from another person taking care of personal grooming?: A Lot Help from another person toileting, which includes using toliet, bedpan, or urinal?: A Little Help from another person bathing (including washing, rinsing, drying)?: A Little Help from another person to put on and taking off regular upper body clothing?: A Little Help from another person to put on and taking off regular lower body  clothing?: A Little 6 Click Score: 17   End of Session Equipment Utilized During Treatment: Gait belt;Rolling walker  Activity Tolerance: Patient tolerated treatment well Patient left: in chair;with call bell/phone within reach;with chair alarm set  OT Visit Diagnosis: Muscle weakness (generalized) (M62.81);Other (comment)(lack of coordination)                Time: 8341-9622 OT Time Calculation (min): 21 min Charges:  OT General Charges $OT Visit: 1 Visit OT Evaluation $OT Eval Low Complexity: 1 Low G-Codes: OT G-codes **NOT FOR INPATIENT CLASS** Functional Assessment Tool Used: AM-PAC 6 Clicks Daily Activity Functional Limitation: Self care Self Care Current Status (W9798): At least 40 percent but less than 60 percent impaired, limited or restricted Self Care Goal Status (X2119): At least 20 percent but less than 40 percent impaired, limited or restricted   Joeanna Howdyshell T Jillianne Gamino, OTR/L, CLT   Rodd Heft 06/02/2017, 10:46 AM

## 2017-06-02 NOTE — Progress Notes (Signed)
Physical Therapy Treatment Patient Details Name: Anthony Allen MRN: [id number] DOB: [birth year] Today's Date: 06/02/2017    History of Present Illness Pt is an 81 y.o.malewith a known history of essential hypertension, diabetes presenting to the ED with a chief complaint of intermittent episodes of weakness associated with slurry speech and loss of balancethat has worsened in the past 3 days. Pt's wife states pt has been deteriorating in the past 2-3 years with dysarthria and gait instability. Patient denies any headache or blurry vision. Denies any weakness in his extremities. No history of stroke in the past.  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     PT Comments    Pt stated he is hoping to go home today.  Ready to participate in session.  Pt was able to ambulate around unit x 2 with a rest in room with walker and min guard.  Pt with occasional imbalances and safety deficits that requires min assist at times.  10X sit to stand in room with emphasis on hand placements and stability and balance.  Participated in exercises as described below.    Pt did well in session but remains at increased fall risk.  Would benefit from +1 assist at all times for balance.  Discussed general safety with pt.  Voiced understanding.   Follow Up Recommendations  Home health PT, +1 assist for mobility.     Equipment Recommendations  Rolling walker with 5" wheels    Recommendations for Other Services       Precautions / Restrictions Precautions Precautions: Fall Restrictions Weight Bearing Restrictions: No    Mobility  Bed Mobility               General bed mobility comments: In recliner  Transfers Overall transfer level: Needs assistance Equipment used: Rolling walker (2 wheeled) Transfers: Sit to/from Stand Sit to Stand: Min guard         General  transfer comment: one LOB upon return to recliner with min a to recover.    Ambulation/Gait Ambulation/Gait assistance: Min guard Ambulation Distance (Feet): 200 Feet Assistive device: Rolling walker (2 wheeled) Gait Pattern/deviations: Step-through pattern;Narrow base of support   Gait velocity interpretation: Below normal speed for age/gender General Gait Details: 200' x 2 with walker and min gaurd. Unsteady at times with min a.  Pt would benefit from +1 assist at all times with mobility for safety.   Stairs            Wheelchair Mobility    Modified Rankin (Stroke Patients Only)       Balance Overall balance assessment: Needs assistance Sitting-balance support: Feet supported;Feet unsupported;No upper extremity supported Sitting balance-Leahy Scale: Good     Standing balance support: Bilateral upper extremity supported;No upper extremity supported Standing balance-Leahy Scale: Fair Standing balance comment: occasional imbalances                            Cognition Arousal/Alertness: Awake/alert Behavior During Therapy: WFL for tasks assessed/performed Overall Cognitive Status: Within Functional Limits for tasks assessed                                        Exercises Other Exercises Other Exercises: sit to stand x 10 with emphasis on safety and balance Other Exercises: standing  marching in place, SLR and toe raises x 10 bilaterally with minguard for safety/balance    General Comments        Pertinent Vitals/Pain Pain Assessment: No/denies pain    Home Living Family/patient expects to be discharged to:: Private residence Living Arrangements: Spouse/significant other Available Help at Discharge: Family;Available 24 hours/day Type of Home: House Home Access: Ramped entrance   Home Layout: One level Home Equipment: None      Prior Function Level of Independence: Independent      Comments: Ind amb without AD, no fall  history, Ind with ADLs, was still driving, performing yardwork (7 acres) and able to perform laundry   PT Goals (current goals can now be found in the care plan section) Acute Rehab PT Goals Patient Stated Goal: to be able to go home and take care of myself Progress towards PT goals: Progressing toward goals    Frequency    7X/week      PT Plan Current plan remains appropriate    Co-evaluation              AM-PAC PT "6 Clicks" Daily Activity  Outcome Measure  Difficulty turning over in bed (including adjusting bedclothes, sheets and blankets)?: None Difficulty moving from lying on back to sitting on the side of the bed? : None Difficulty sitting down on and standing up from a chair with arms (e.g., wheelchair, bedside commode, etc,.)?: A Little Help needed moving to and from a bed to chair (including a wheelchair)?: A Little Help needed walking in hospital room?: A Little Help needed climbing 3-5 steps with a railing? : A Little 6 Click Score: 20    End of Session Equipment Utilized During Treatment: Gait belt Activity Tolerance: Patient tolerated treatment well Patient left: in chair;with chair alarm set;with call bell/phone within reach Nurse Communication: Mobility status       Time: 7209-4709 PT Time Calculation (min) (ACUTE ONLY): 16 min  Charges:  $Gait Training: 8-22 mins                    G Codes:       Chesley Noon, PTA 06/02/17, 11:34 AM

## 2017-06-02 NOTE — Progress Notes (Signed)
Pt for discharge home/ alert. No resp distress. Discharge discussed with pt and wife.  presc given and discussed and other home meds.  Diet  Discussed/ nectar thick liquids discussed. Activity and f/u discussed/  Verbalization of understanding  Voiced. Home via w/c at this time. Sl d/cd.

## 2017-06-02 NOTE — Progress Notes (Signed)
  Speech Language Pathology Treatment: Dysphagia  Patient Details Name: Anthony Allen MRN: [id number] DOB: 1935-08-21 Today's Date: 06/02/2017 Time: 1287-8676 SLP Time Calculation (min) (ACUTE ONLY): 51 min  Assessment / Plan / Recommendation Clinical Impression  Pt seen for ongoing assessment of toleration of dysphagia diet. Pt appears to tolerate the minced foods diet (for easier mastication effort) w/ Nectar liquids via Cup w/ no overt s/s of aspiration noted. Suspect d/t new dx of L pontion CVA, pt's oropharyngeal phase dysphagia could be directly related to such. Pt does have noted pharyngeal phase dysphagia w/ thin liquids. Adequate oral phase management of boluses but slower overall mastication and transfer w/ increased textures - Wife noted he eats slowly at home. Neurology/MD consulted feel that pt does likely have progressive neurodegenerative process and needs f/u w/ Neurology at discharge.  Pt would benefit from continuing a dysphagia diet w/ Nectar consistency liquids w/ objective swallow study to further assess swallow function b/f a diet upgrade (of liquids). The level 2 foods would allow for easier mastication and conservation of energy at this time. Education given on diet consistency, food prep and options, handouts on thickening liquids/ordering; handouts on dysphagia and aspiration precautions. Recommend for f/u w/ [place] services at d/c. Wife updated and agreed. NSG/CM updated.     HPI HPI: Pt is an 81 y.o. male  with a known history of essential hypertension, diabetes presenting to the ED with a chief complaint of intermittent episodes of weakness associated with slurry speech and loss of balance that has worsened in the past 3 days.  Pt's wife states pt has been deteriorating in the past 2-3 years with dysarthria and gait instability; Cognition. Patient denies any headache or blurry vision. Denies any weakness in his extremities. No history of stroke in the past.   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 WIfe did indicated some swallowing issues at home intermittently but was not able to describe incidents.       SLP Plan  Continue with current plan of care       Recommendations  Diet recommendations: Dysphagia 2 (fine chop);Nectar-thick liquid Liquids provided via: Cup;No straw Medication Administration: Whole meds with puree(or whole in Nectar) Supervision: Patient able to self feed;Intermittent supervision to cue for compensatory strategies Compensations: Minimize environmental distractions;Slow rate;Small sips/bites;Lingual sweep for clearance of pocketing;Multiple dry swallows after each bite/sip;Follow solids with liquid Postural Changes and/or Swallow Maneuvers: Seated upright 90 degrees;Upright 30-60 min after meal                General recommendations: (Dietician f/u) Oral Care Recommendations: Oral care BID;Staff/trained caregiver to provide oral care Follow up Recommendations: Skilled Nursing facility;Home health SLP SLP Visit Diagnosis: Dysphagia, oropharyngeal phase (R13.12);Dysphagia, pharyngeal phase (R13.13) Plan: Continue with current plan of care       [place], [place], CCC-SLP Tonnya Garbett 06/02/2017, 12:48 PM

## 2017-06-02 NOTE — Care Management Note (Signed)
Case Management Note  Patient Details  Name: Anthony Allen MRN: [id number] Date of Birth: [birth year]  Subjective/Objective:  Admitted to [facility] [place] for the diagnosis of CVA. Lives with wife, Kermit Balo [phone]). Last visit with Dr, Derrel Nip was 05/06/17. Prescriptions are filled at [facility]. No home Health, No skilled Nursing. No home oxygen. No equipment in the home. Takes care of all basic activities of daily living himself, can drive if needed. No falls Fair appetite. Family will transport.                  Action/Plan: All disciples evaluation completed. Recommending speech, occupational and physical therapy in the home. Will add Education officer, museum and aide.  Friend has equipment that they can borrow Discharge to home today per Dr. Verdell Carmine  Expected Discharge Date:  06/02/17               Expected Discharge Plan:     In-House Referral:   yes  Discharge planning Services     Post Acute Care Choice:   yes Choice offered to:   wife  DME Arranged:    DME Agency:     HH Arranged:   yes  HH Agency:   [facility]  Status of Service:     If discussed at [place] of Stay Meetings, dates discussed:    Additional Comments:  Shelbie Ammons, RN MSN CCM Care Management [phone] 06/02/2017, 12:32 PM

## 2017-06-02 NOTE — Discharge Summary (Signed)
[facility] NAME: Anthony Allen    MR#:  [id number]  DATE OF BIRTH:  May 14, 1936  DATE OF ADMISSION:  05/31/2017 ADMITTING PHYSICIAN: Nicholes Mango, MD  DATE OF DISCHARGE: 06/02/2017   PRIMARY CARE PHYSICIAN: Crecencio Mc, MD    ADMISSION DIAGNOSIS:  TIA (transient ischemic attack) [G45.9]  DISCHARGE DIAGNOSIS:  Active Problems:   TIA (transient ischemic attack)   CVA (cerebral vascular accident) ([place])   SECONDARY DIAGNOSIS:   Past Medical History:  Diagnosis Date  . Gout   . Hypertension   . Type 2 diabetes mellitus with hyperglycemia [facility])     HOSPITAL COURSE:   81 year old male with past medical history of hypertension, gout, diabetes who presented to the hospital due to slurred speech/aphasia.  1. Slurred speech/aphasia-secondary to a CVA combined with a suspected underlying neurodegenerative process possibly Parkinson's disease. -Patient was admitted to the hospital with the above complaints underwent an extensive neurologic workup including CT head which was negative but MRI of the brain did show a left-sided pontine CVA. Patient also had an MRA which showed some vertebral stenosis on the left side. Neurology consult was obtained and they recommended dual antiplatelet therapy with aspirin, Plavix. Patient's Crestor was also increased to a high intensity dose. -She's carotid duplex showed no evidence of hemodynamically significant carotid artery stenosis. Two-dimensional echocardiogram showed normal ejection fraction with no evidence of intracardiac thrombus. -Patient was seen by physical therapy and occupational therapy and recommended home health services which is being arranged for the patient prior to discharge. Patient was also seen by speech therapy and placed on a dysphagia 2 diet with nectar thick liquids. -Patient should follow up with neurology as an outpatient and he needs to be referred to a neurologist  which is to be arranged by his primary care physician.  2. Essential hypertension- pt. Will continue Norvasc, losartan.  3. Hyperlipidemia- pt. Will cont. Crestor but dose increased to 20 mg Daily due to CVA.   4. Glaucoma- pt. Will continue latanoprost, timolol eyedrops.  5. Diabetes type 2 without complication- while in the hospital pt. Was on SSI but will resume his Jardiance upon discharge.   6. 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 He underwent a CT of his neck which did confirm a left-sided parotid mass with extension into the parapharyngeal space. -Patient has no airway compromise. He is being discharged home with outpatient ENT referral. He would Benefit from having a biopsy done as an outpatient.    DISCHARGE CONDITIONS:   Stable  CONSULTS OBTAINED:  Treatment Team:  [name] Pain, MD  DRUG ALLERGIES:  No Known Allergies  DISCHARGE MEDICATIONS:   Allergies as of 06/02/2017   No Known Allergies     Medication List    TAKE these medications   amLODipine 10 MG tablet Commonly known as:  NORVASC Take 1 tablet by mouth daily.   aspirin EC 81 MG tablet Take 81 mg by mouth daily.   BC HEADACHE POWDER PO Take 1 packet by mouth as needed.   cholecalciferol 1000 units tablet Commonly known as:  VITAMIN D Take 1,000 Units by mouth daily.   clopidogrel 75 MG tablet Commonly known as:  PLAVIX Take 1 tablet (75 mg total) by mouth daily. Start taking on:  06/03/2017   empagliflozin 10 MG Tabs tablet Commonly known as:  JARDIANCE Take 10 mg by mouth daily.   fluticasone 50  MCG/ACT nasal spray Commonly known as:  FLONASE Place 2 sprays into both nostrils daily.   glucose blood test strip Use as instructed   HYDROCORTISONE (TOPICAL) 2 % Lotn Apply 1 application topically as needed (itching).   latanoprost 0.005 % ophthalmic solution Commonly known as:  XALATAN place 1 drop into right  eye at bedtime   losartan 50 MG tablet Commonly known as:  COZAAR Take 1 tablet (50 mg total) by mouth daily.   multivitamin-lutein Caps capsule Take 1 capsule by mouth daily.   onetouch ultrasoft lancets Use as instructed   rosuvastatin 20 MG tablet Commonly known as:  CRESTOR Take 1 tablet (20 mg total) by mouth daily. What changed:    medication strength  how much to take   timolol 0.5 % ophthalmic solution Commonly known as:  TIMOPTIC take 1 drop into right eye once daily            Durable Medical Equipment  (From admission, onward)        Start     Ordered   06/02/17 1229  For home use only DME Walker rolling  Once    Question:  Patient needs a walker to treat with the following condition  Answer:  CVA (cerebral vascular accident) ([place])   06/02/17 1228        DISCHARGE INSTRUCTIONS:   DIET:  Cardiac diet and Diabetic diet  Dysphagia 2 with nectar thick liquids and aspiration precautions. DISCHARGE CONDITION:  Stable  ACTIVITY:  Activity as tolerated  OXYGEN:  Home Oxygen: No.   Oxygen Delivery: room air  DISCHARGE LOCATION:  Home with home health nursing, physical therapy, occupational therapy, aide.    If you experience worsening of your admission symptoms, develop shortness of breath, life threatening emergency, suicidal or homicidal thoughts you must seek medical attention immediately by calling 911 or calling your MD immediately  if symptoms less severe.  You Must read complete instructions/literature along with all the possible adverse reactions/side effects for all the Medicines you take and that have been prescribed to you. Take any new Medicines after you have completely understood and accpet all the possible adverse reactions/side effects.   Please note  You were cared for by a hospitalist during your hospital stay. If you have any questions about your discharge medications or the care you received while you were in the hospital  after you are discharged, you can call the unit and asked to speak with the hospitalist on call if the hospitalist that took care of you is not available. Once you are discharged, your primary care physician will handle any further medical issues. Please note that NO REFILLS for any discharge medications will be authorized once you are discharged, as it is imperative that you return to your primary care physician (or establish a relationship with a primary care physician if you do not have one) for your aftercare needs so that they can reassess your need for medications and monitor your lab values.     Today   No other focal events overnight. Still has intermittent bouts of difficulty swallowing but improved since yesterday. Worked with physical therapy and recommended home health services. Denies any headache or any further worsening slurred speech or any other new neurologic symptoms.  VITAL SIGNS:  Blood pressure (!) 143/75, pulse 93, temperature (!) 97.5 F (36.4 C), temperature source Oral, resp. rate 18, height 5\' 9"  (1.753 m), weight 70.5 kg (155 lb 6.8 oz), SpO2 100 %.  I/O:  Intake/Output Summary (Last 24 hours) at 06/02/2017 1245 Last data filed at 06/02/2017 1011 Gross per 24 hour  Intake 720 ml  Output 300 ml  Net 420 ml    PHYSICAL EXAMINATION:   GENERAL:  81 y.o.-year-old patient lying in bed in no acute distress.  EYES: Pupils equal, round, reactive to light and accommodation. No scleral icterus. Extraocular muscles intact.  HEENT: Head atraumatic, normocephalic. Oropharynx and nasopharynx clear.  NECK:  Supple, no jugular venous distention. No thyroid enlargement, no tenderness.  LUNGS: Normal breath sounds bilaterally, no wheezing, rales, rhonchi. No use of accessory muscles of respiration.  CARDIOVASCULAR: S1, S2 normal. No murmurs, rubs, or gallops.  ABDOMEN: Soft, nontender, nondistended. Bowel sounds present. No organomegaly or mass.  EXTREMITIES: No cyanosis,  clubbing or edema b/l.    NEUROLOGIC: Cranial nerves II through XII are intact. No focal Motor or sensory deficits b/l. Mild Slurred speech.  PSYCHIATRIC: The patient is alert and oriented x 3.  SKIN: No obvious rash, lesion, or ulcer.    DATA REVIEW:   CBC Recent Labs  Lab 05/31/17 1044  WBC 8.3  HGB 16.3  HCT 47.8  PLT 155    Chemistries  Recent Labs  Lab 05/31/17 1044  NA 138  K 3.9  CL 108  CO2 23  GLUCOSE 202*  BUN 17  CREATININE 1.57*  CALCIUM 9.2  AST 31  ALT 41  ALKPHOS 84  BILITOT 1.1    Cardiac Enzymes Recent Labs  Lab 05/31/17 1044  TROPONINI <0.03    Microbiology Results  Results for orders placed or performed in visit on 09/02/13  Fecal occult blood, imunochemical     Status: None   Collection Time: 09/02/13  2:59 PM  Result Value Ref Range Status   Fecal Occult Bld Negative Negative Final    RADIOLOGY:  Ct Soft Tissue Neck W Contrast  Result Date: 06/01/2017 CLINICAL DATA:  Continued surveillance of a LEFT pre styloid parapharyngeal mass. Difficulty swallowing. Acute pontine infarct. EXAM: CT NECK WITH CONTRAST TECHNIQUE: Multidetector CT imaging of the neck was performed using the standard protocol following the bolus administration of intravenous contrast. CONTRAST:  32mL ISOVUE-300 IOPAMIDOL (ISOVUE-300) INJECTION 61% COMPARISON:  MR brain 06/01/2017. FINDINGS: Pharynx and larynx: No intrinsic pharyngeal or laryngeal pathology. Salivary glands: Normal RIGHT parotid gland and submandibular glands. Inseparable from the LEFT lobe of the parotid is a tubular 4 x 1 pre styloid mass extending from the parotid space into the parapharyngeal space. 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 ENT consultation is warranted (on elective basis). Thyroid: None. Lymph nodes: None enlarged or abnormal density. Vascular: No vascular occlusion. Carotid bifurcation atherosclerosis. Calcification of the cavernous internal carotid arteries consistent with cerebrovascular  atherosclerotic disease. Limited intracranial: Negative. The pontine infarct is not visible on CT. Visualized orbits: No orbital masses. Mastoids and visualized paranasal sinuses: Mastoids are clear. Layering fluid LEFT maxillary sinus. Skeleton: Cervical spondylosis. Multiple missing teeth, including a large area of osseous destruction in the body of the LEFT mandible, 1 x 2 cm cross-section lateral cortex disruption, suspect focal osteomyelitis of the LEFT mandible related to periodontal disease. Upper chest: No nodule or pneumothorax. Other: None. IMPRESSION: 4 x 1 cross-section deep lobe LEFT parotid mass with extension to the parapharyngeal space. 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 Elective ENT consultation is warranted. No airway displacement or obstruction. Lytic area of destruction in the LEFT mandibular body, approximate 1 x 2 cm, suspect focal osteomyelitis related to periodontal disease. Oral and maxillofacial consultation is warranted.  Electronically Signed   By: Staci Righter M.D.   On: 06/01/2017 14:58   Mr Brain Wo Contrast  Result Date: 06/01/2017 CLINICAL DATA:  TIA. Intermittent weakness and slurred speech with loss of balance. EXAM: MRI HEAD WITHOUT CONTRAST MRA HEAD WITHOUT CONTRAST TECHNIQUE: Multiplanar, multiecho pulse sequences of the brain and surrounding structures were obtained without intravenous contrast. Angiographic images of the head were obtained using MRA technique without contrast. COMPARISON:  Head CT from yesterday FINDINGS: MRI HEAD FINDINGS Brain: Confluent restricted diffusion in the left pons. Remote right corona radiata infarct with atrophic right midbrain and pons attributed to wallerian changes. Chronic small vessel ischemic gliosis in the cerebral white matter. No hemorrhage, hydrocephalus, or masslike finding. Vascular: Abnormal left vertebral flow void, see below. Skull and upper cervical spine: Negative for marrow lesion. Subgaleal lipoma in the central  forehead measuring up to 2.7 cm. Sinuses/Orbits: No acute finding. Mucous retention cyst in the left maxillary sinus. Other: Pre styloid mass in the left parapharyngeal fat extending to the deep lobe of the parotid. The homogeneous mass measures 4 x 1.2 cm where covered. MRA HEAD FINDINGS The non dominant left vertebral artery is occluded. Moderate proximal basilar narrowing. Fetal type left PCA. Advanced stenosis of the bilateral P2 segments with asymmetric poor flow on the left. high-grade right proximal pica stenosis. Symmetric carotid arteries. Aplastic left A1 segment. Azygos A2 segment. No branch occlusion. Negative for aneurysm. These results were called by telephone at the time of interpretation on 06/01/2017 at 12:59 pm to Dr. Verdell Carmine, who verbally acknowledged these results. IMPRESSION: Brain MRI: 1. Acute left pontine infarct. 2. Remote right corona radiata infarct . 3. 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 Recommend ENT referral if clinically appropriate. Intracranial MRA 1. Left vertebral occlusion that is age indeterminate. 2. Advanced intracranial atherosclerosis. There is high-grade right PICA and bilateral P2 segment stenoses. Moderate mid basilar stenosis. Electronically Signed   By: Monte Fantasia M.D.   On: 06/01/2017 13:01   US Carotid Bilateral (at [facility] Only)  Result Date: 06/01/2017 CLINICAL DATA:  TIA, hypertension and diabetes. EXAM: BILATERAL CAROTID DUPLEX ULTRASOUND TECHNIQUE: Pearline Cables scale imaging, color Doppler and duplex ultrasound were performed of bilateral carotid and vertebral arteries in the neck. COMPARISON:  None. FINDINGS: Criteria: Quantification of carotid stenosis is based on velocity parameters that correlate the residual internal carotid diameter with NASCET-based stenosis levels, using the diameter of the distal internal carotid lumen as the denominator for stenosis measurement. The following velocity measurements  were obtained: RIGHT ICA:  71/20 cm/sec CCA:  16/10 cm/sec SYSTOLIC ICA/CCA RATIO:  0.8 DIASTOLIC ICA/CCA RATIO:  1.8 ECA:  143 cm/sec LEFT ICA:  78/9 cm/sec CCA:  96/0 cm/sec SYSTOLIC ICA/CCA RATIO:  0.9 DIASTOLIC ICA/CCA RATIO:  3 ECA:  136 cm/sec RIGHT CAROTID ARTERY: There is a mild amount of calcified plaque at the level of the carotid bulb and proximal right ICA. Estimated right ICA stenosis is less than 50%. RIGHT VERTEBRAL ARTERY: Antegrade flow with normal waveform and velocity. LEFT CAROTID ARTERY: Minimal amount of partially calcified plaque is present at the level of the carotid bulb. This approaches the ICA origin. Estimated left ICA stenosis is less than 50%. LEFT VERTEBRAL ARTERY: Antegrade flow with normal waveform and velocity. IMPRESSION: Mild amount of calcified plaque at the level of the right carotid bulb and proximal right ICA. Minimal plaque at the level of the left carotid bulb and left ICA origin. Estimated  bilateral ICA stenoses are less than 50%. Electronically Signed   By: Aletta Edouard M.D.   On: 06/01/2017 09:05   Mr Jodene Nam Head/brain VZ Cm  Result Date: 06/01/2017 CLINICAL DATA:  TIA. Intermittent weakness and slurred speech with loss of balance. EXAM: MRI HEAD WITHOUT CONTRAST MRA HEAD WITHOUT CONTRAST TECHNIQUE: Multiplanar, multiecho pulse sequences of the brain and surrounding structures were obtained without intravenous contrast. Angiographic images of the head were obtained using MRA technique without contrast. COMPARISON:  Head CT from yesterday FINDINGS: MRI HEAD FINDINGS Brain: Confluent restricted diffusion in the left pons. Remote right corona radiata infarct with atrophic right midbrain and pons attributed to wallerian changes. Chronic small vessel ischemic gliosis in the cerebral white matter. No hemorrhage, hydrocephalus, or masslike finding. Vascular: Abnormal left vertebral flow void, see below. Skull and upper cervical spine: Negative for marrow lesion. Subgaleal  lipoma in the central forehead measuring up to 2.7 cm. Sinuses/Orbits: No acute finding. Mucous retention cyst in the left maxillary sinus. Other: Pre styloid mass in the left parapharyngeal fat extending to the deep lobe of the parotid. The homogeneous mass measures 4 x 1.2 cm where covered. MRA HEAD FINDINGS The non dominant left vertebral artery is occluded. Moderate proximal basilar narrowing. Fetal type left PCA. Advanced stenosis of the bilateral P2 segments with asymmetric poor flow on the left. high-grade right proximal pica stenosis. Symmetric carotid arteries. Aplastic left A1 segment. Azygos A2 segment. No branch occlusion. Negative for aneurysm. These results were called by telephone at the time of interpretation on 06/01/2017 at 12:59 pm to Dr. Verdell Carmine, who verbally acknowledged these results. IMPRESSION: Brain MRI: 1. Acute left pontine infarct. 2. Remote right corona radiata infarct . 3. 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 Recommend ENT referral if clinically appropriate. Intracranial MRA 1. Left vertebral occlusion that is age indeterminate. 2. Advanced intracranial atherosclerosis. There is high-grade right PICA and bilateral P2 segment stenoses. Moderate mid basilar stenosis. Electronically Signed   By: Monte Fantasia M.D.   On: 06/01/2017 13:01      Management plans discussed with the patient, family and they are in agreement.  CODE STATUS:     Code Status Orders  (From admission, onward)        Start     Ordered   05/31/17 1657  Full code  Continuous     05/31/17 1656    Code Status History    Date Active Date Inactive Code Status Order ID Comments User Context   This patient has a current code status but no historical code status.      TOTAL TIME TAKING CARE OF THIS PATIENT: 40 minutes.    Henreitta Leber M.D on 06/02/2017 at 12:45 PM  Between 7am to 6pm - Pager - [phone]  After 6pm go to [email or web address] -  Proofreader  [employer] [facility]  Office  [phone]  CC: Primary care physician; Crecencio Mc, MD

## 2017-06-02 NOTE — Progress Notes (Signed)
Neurology:  D/w with pts wife:  He has been progressively declining over past 2-3 yrs.  There is a possibility of vascular dementia given old lacunar infarcts but I think more likely neurodegenerative process such as Parkinsonian plus maybe PSP or MSA Would need to follow up with out patient Neurology ad progressive monitoring  Con't on dual anti platelet therapy for at least 90 days in setting of stenosis in multiple areas of posterior circulation and L pontine stroke.  Appreciate PT evaluation with home health PT D/c planning from Neuro stand point

## 2017-06-03 ENCOUNTER — Emergency Department (HOSPITAL_COMMUNITY): Payer: Medicare Other

## 2017-06-03 ENCOUNTER — Encounter (HOSPITAL_COMMUNITY): Payer: Self-pay | Admitting: Emergency Medicine

## 2017-06-03 ENCOUNTER — Observation Stay (HOSPITAL_COMMUNITY)
Admission: EM | Admit: 2017-06-03 | Discharge: 2017-06-05 | Disposition: A | Discharge status: Home / Self Care | Payer: Medicare Other | Attending: Internal Medicine | Admitting: Internal Medicine

## 2017-06-03 DIAGNOSIS — E1122 Type 2 diabetes mellitus with diabetic chronic kidney disease: Secondary | ICD-10-CM | POA: Diagnosis present

## 2017-06-03 DIAGNOSIS — R260 Ataxic gait: Secondary | ICD-10-CM | POA: Insufficient documentation

## 2017-06-03 DIAGNOSIS — I651 Occlusion and stenosis of basilar artery: Secondary | ICD-10-CM | POA: Diagnosis not present

## 2017-06-03 DIAGNOSIS — I69391 Dysphagia following cerebral infarction: Secondary | ICD-10-CM

## 2017-06-03 DIAGNOSIS — D479 Neoplasm of uncertain behavior of lymphoid, hematopoietic and related tissue, unspecified: Secondary | ICD-10-CM | POA: Insufficient documentation

## 2017-06-03 DIAGNOSIS — E559 Vitamin D deficiency, unspecified: Secondary | ICD-10-CM | POA: Diagnosis not present

## 2017-06-03 DIAGNOSIS — Z79899 Other long term (current) drug therapy: Secondary | ICD-10-CM | POA: Diagnosis not present

## 2017-06-03 DIAGNOSIS — M109 Gout, unspecified: Secondary | ICD-10-CM | POA: Insufficient documentation

## 2017-06-03 DIAGNOSIS — I10 Essential (primary) hypertension: Secondary | ICD-10-CM | POA: Diagnosis present

## 2017-06-03 DIAGNOSIS — N183 Chronic kidney disease, stage 3 unspecified: Secondary | ICD-10-CM | POA: Diagnosis present

## 2017-06-03 DIAGNOSIS — K116 Mucocele of salivary gland: Secondary | ICD-10-CM | POA: Diagnosis present

## 2017-06-03 DIAGNOSIS — Z1211 Encounter for screening for malignant neoplasm of colon: Secondary | ICD-10-CM | POA: Insufficient documentation

## 2017-06-03 DIAGNOSIS — G8191 Hemiplegia, unspecified affecting right dominant side: Secondary | ICD-10-CM | POA: Diagnosis not present

## 2017-06-03 DIAGNOSIS — E785 Hyperlipidemia, unspecified: Secondary | ICD-10-CM

## 2017-06-03 DIAGNOSIS — F419 Anxiety disorder, unspecified: Secondary | ICD-10-CM | POA: Diagnosis not present

## 2017-06-03 DIAGNOSIS — E1129 Type 2 diabetes mellitus with other diabetic kidney complication: Secondary | ICD-10-CM | POA: Diagnosis present

## 2017-06-03 DIAGNOSIS — R531 Weakness: Secondary | ICD-10-CM | POA: Diagnosis not present

## 2017-06-03 DIAGNOSIS — E1165 Type 2 diabetes mellitus with hyperglycemia: Secondary | ICD-10-CM | POA: Insufficient documentation

## 2017-06-03 DIAGNOSIS — R221 Localized swelling, mass and lump, neck: Secondary | ICD-10-CM | POA: Diagnosis not present

## 2017-06-03 DIAGNOSIS — E1169 Type 2 diabetes mellitus with other specified complication: Secondary | ICD-10-CM | POA: Diagnosis present

## 2017-06-03 DIAGNOSIS — Z7982 Long term (current) use of aspirin: Secondary | ICD-10-CM | POA: Insufficient documentation

## 2017-06-03 DIAGNOSIS — I6930 Unspecified sequelae of cerebral infarction: Secondary | ICD-10-CM | POA: Diagnosis present

## 2017-06-03 DIAGNOSIS — N400 Enlarged prostate without lower urinary tract symptoms: Secondary | ICD-10-CM | POA: Diagnosis not present

## 2017-06-03 DIAGNOSIS — K219 Gastro-esophageal reflux disease without esophagitis: Secondary | ICD-10-CM | POA: Insufficient documentation

## 2017-06-03 DIAGNOSIS — I639 Cerebral infarction, unspecified: Secondary | ICD-10-CM

## 2017-06-03 DIAGNOSIS — I5189 Other ill-defined heart diseases: Secondary | ICD-10-CM

## 2017-06-03 DIAGNOSIS — H409 Unspecified glaucoma: Secondary | ICD-10-CM | POA: Diagnosis present

## 2017-06-03 DIAGNOSIS — E1121 Type 2 diabetes mellitus with diabetic nephropathy: Secondary | ICD-10-CM | POA: Diagnosis present

## 2017-06-03 DIAGNOSIS — I129 Hypertensive chronic kidney disease with stage 1 through stage 4 chronic kidney disease, or unspecified chronic kidney disease: Secondary | ICD-10-CM | POA: Insufficient documentation

## 2017-06-03 DIAGNOSIS — I63532 Cerebral infarction due to unspecified occlusion or stenosis of left posterior cerebral artery: Secondary | ICD-10-CM | POA: Diagnosis not present

## 2017-06-03 DIAGNOSIS — R2681 Unsteadiness on feet: Secondary | ICD-10-CM | POA: Diagnosis not present

## 2017-06-03 HISTORY — DX: Adverse effect of unspecified anesthetic, initial encounter: T41.45XA

## 2017-06-03 HISTORY — DX: Unspecified glaucoma: H40.9

## 2017-06-03 HISTORY — DX: Other complications of anesthesia, initial encounter: T88.59XA

## 2017-06-03 HISTORY — DX: Cerebral infarction, unspecified: I63.9

## 2017-06-03 HISTORY — DX: Family history of other specified conditions: Z84.89

## 2017-06-03 HISTORY — DX: Other diseases of salivary glands: K11.8

## 2017-06-03 HISTORY — DX: Unspecified osteoarthritis, unspecified site: M19.90

## 2017-06-03 LAB — COMPREHENSIVE METABOLIC PANEL
ALBUMIN: 4 g/dL (ref 3.5–5.0)
ALK PHOS: 79 U/L (ref 38–126)
ALT: 37 U/L (ref 17–63)
ANION GAP: 11 (ref 5–15)
AST: 35 U/L (ref 15–41)
BUN: 31 mg/dL — ABNORMAL HIGH (ref 6–20)
CHLORIDE: 103 mmol/L (ref 101–111)
CO2: 24 mmol/L (ref 22–32)
Calcium: 10.1 mg/dL (ref 8.9–10.3)
Creatinine, Ser: 1.64 mg/dL — ABNORMAL HIGH (ref 0.61–1.24)
GFR calc non Af Amer: 38 mL/min — ABNORMAL LOW (ref >60)
GFR, EST AFRICAN AMERICAN: 44 mL/min — AB (ref >60)
GLUCOSE: 167 mg/dL — AB (ref 65–99)
POTASSIUM: 4.2 mmol/L (ref 3.5–5.1)
SODIUM: 138 mmol/L (ref 135–145)
Total Bilirubin: 1.4 mg/dL — ABNORMAL HIGH (ref 0.3–1.2)
Total Protein: 7.1 g/dL (ref 6.5–8.1)

## 2017-06-03 LAB — CBC
HCT: 52 % (ref 39.0–52.0)
Hemoglobin: 18.8 g/dL — ABNORMAL HIGH (ref 13.0–17.0)
MCH: 33.6 pg (ref 26.0–34.0)
MCHC: 36.2 g/dL — ABNORMAL HIGH (ref 30.0–36.0)
MCV: 93 fL (ref 78.0–100.0)
PLATELETS: 197 10*3/uL (ref 150–400)
RBC: 5.59 MIL/uL (ref 4.22–5.81)
RDW: 13.8 % (ref 11.5–15.5)
WBC: 8.9 10*3/uL (ref 4.0–10.5)

## 2017-06-03 LAB — GLUCOSE, CAPILLARY: Glucose-Capillary: 134 mg/dL — ABNORMAL HIGH (ref 65–99)

## 2017-06-03 LAB — I-STAT CHEM 8, ED
BUN: 33 mg/dL — AB (ref 6–20)
CALCIUM ION: 1.22 mmol/L (ref 1.15–1.40)
CHLORIDE: 104 mmol/L (ref 101–111)
Creatinine, Ser: 1.6 mg/dL — ABNORMAL HIGH (ref 0.61–1.24)
GLUCOSE: 164 mg/dL — AB (ref 65–99)
HCT: 53 % — ABNORMAL HIGH (ref 39.0–52.0)
Hemoglobin: 18 g/dL — ABNORMAL HIGH (ref 13.0–17.0)
Potassium: 4.2 mmol/L (ref 3.5–5.1)
Sodium: 141 mmol/L (ref 135–145)
TCO2: 25 mmol/L (ref 22–32)

## 2017-06-03 LAB — DIFFERENTIAL
BASOS PCT: 1 %
Basophils Absolute: 0.1 10*3/uL (ref 0.0–0.1)
EOS ABS: 0.2 10*3/uL (ref 0.0–0.7)
EOS PCT: 3 %
LYMPHS PCT: 18 %
Lymphs Abs: 1.6 10*3/uL (ref 0.7–4.0)
MONO ABS: 0.6 10*3/uL (ref 0.1–1.0)
Monocytes Relative: 7 %
NEUTROS PCT: 71 %
Neutro Abs: 6.4 10*3/uL (ref 1.7–7.7)

## 2017-06-03 LAB — APTT: aPTT: 31 seconds (ref 24–36)

## 2017-06-03 LAB — PROTIME-INR
INR: 1.05
PROTHROMBIN TIME: 13.6 s (ref 11.4–15.2)

## 2017-06-03 LAB — I-STAT TROPONIN, ED: Troponin i, poc: 0 ng/mL (ref 0.00–0.08)

## 2017-06-03 MED ORDER — HYDROCORTISONE 1 % EX CREA
1.0000 "application " | TOPICAL_CREAM | CUTANEOUS | Status: DC | PRN
  Filled 2017-06-03: qty 28

## 2017-06-03 MED ORDER — ACETAMINOPHEN 160 MG/5ML PO SOLN: 650.0000 mg | ORAL | Status: DC | PRN

## 2017-06-03 MED ORDER — VITAMIN D 1000 UNITS PO TABS
1000.0000 [IU] | ORAL_TABLET | Freq: Every day | ORAL | Status: DC
  Administered 2017-06-04 – 2017-06-05 (×2): 1000 [IU] via ORAL
  Filled 2017-06-03 (×2): qty 1

## 2017-06-03 MED ORDER — ASPIRIN 300 MG RE SUPP
150.0000 mg | Freq: Every day | RECTAL | Status: DC
  Administered 2017-06-03: 150 mg via RECTAL
  Filled 2017-06-03: qty 1

## 2017-06-03 MED ORDER — ACETAMINOPHEN 650 MG RE SUPP: 650.0000 mg | RECTAL | Status: DC | PRN

## 2017-06-03 MED ORDER — INSULIN ASPART 100 UNIT/ML ~~LOC~~ SOLN
0.0000 [IU] | Freq: Three times a day (TID) | SUBCUTANEOUS | Status: DC
  Administered 2017-06-04: 3 [IU] via SUBCUTANEOUS
  Administered 2017-06-05: 2 [IU] via SUBCUTANEOUS
  Administered 2017-06-05: 1 [IU] via SUBCUTANEOUS

## 2017-06-03 MED ORDER — FLUTICASONE PROPIONATE 50 MCG/ACT NA SUSP
2.0000 | Freq: Every day | NASAL | Status: DC | PRN
  Filled 2017-06-03: qty 16

## 2017-06-03 MED ORDER — SODIUM CHLORIDE 0.9 % IV SOLN
INTRAVENOUS | Status: DC
  Administered 2017-06-04 (×2): via INTRAVENOUS

## 2017-06-03 MED ORDER — ONDANSETRON HCL 4 MG/2ML IJ SOLN: 4.0000 mg | Freq: Three times a day (TID) | INTRAMUSCULAR | Status: DC | PRN

## 2017-06-03 MED ORDER — LOSARTAN POTASSIUM 50 MG PO TABS
50.0000 mg | ORAL_TABLET | Freq: Every day | ORAL | Status: DC
  Administered 2017-06-04 – 2017-06-05 (×2): 50 mg via ORAL
  Filled 2017-06-03 (×2): qty 1

## 2017-06-03 MED ORDER — HYDRALAZINE HCL 20 MG/ML IJ SOLN: 5.0000 mg | INTRAMUSCULAR | Status: DC | PRN

## 2017-06-03 MED ORDER — ASPIRIN-ACETAMINOPHEN-CAFFEINE 250-250-65 MG PO TABS
1.0000 | ORAL_TABLET | Freq: Three times a day (TID) | ORAL | Status: DC | PRN
  Filled 2017-06-03: qty 1

## 2017-06-03 MED ORDER — ENOXAPARIN SODIUM 40 MG/0.4ML ~~LOC~~ SOLN
40.0000 mg | SUBCUTANEOUS | Status: DC
  Administered 2017-06-04 – 2017-06-05 (×3): 40 mg via SUBCUTANEOUS
  Filled 2017-06-03 (×3): qty 0.4

## 2017-06-03 MED ORDER — ZOLPIDEM TARTRATE 5 MG PO TABS
5.0000 mg | ORAL_TABLET | Freq: Every evening | ORAL | Status: DC | PRN
  Administered 2017-06-04 – 2017-06-05 (×3): 5 mg via ORAL
  Filled 2017-06-03 (×3): qty 1

## 2017-06-03 MED ORDER — STROKE: EARLY STAGES OF RECOVERY BOOK
Freq: Once | Status: DC
  Filled 2017-06-03: qty 1

## 2017-06-03 MED ORDER — PROSIGHT PO TABS
1.0000 | ORAL_TABLET | Freq: Every day | ORAL | Status: DC
  Administered 2017-06-04 – 2017-06-05 (×2): 1 via ORAL
  Filled 2017-06-03 (×2): qty 1

## 2017-06-03 MED ORDER — SENNOSIDES-DOCUSATE SODIUM 8.6-50 MG PO TABS: 1.0000 | ORAL_TABLET | Freq: Every evening | ORAL | Status: DC | PRN

## 2017-06-03 MED ORDER — LATANOPROST 0.005 % OP SOLN
1.0000 [drp] | Freq: Every day | OPHTHALMIC | Status: DC
  Administered 2017-06-04 (×2): 1 [drp] via OPHTHALMIC
  Filled 2017-06-03: qty 2.5

## 2017-06-03 MED ORDER — TIMOLOL MALEATE 0.5 % OP SOLN
1.0000 [drp] | Freq: Every day | OPHTHALMIC | Status: DC
  Administered 2017-06-04 – 2017-06-05 (×2): 1 [drp] via OPHTHALMIC
  Filled 2017-06-03: qty 5

## 2017-06-03 MED ORDER — CLOPIDOGREL BISULFATE 75 MG PO TABS
75.0000 mg | ORAL_TABLET | Freq: Every day | ORAL | Status: DC
  Administered 2017-06-04 – 2017-06-05 (×3): 75 mg via ORAL
  Filled 2017-06-03 (×3): qty 1

## 2017-06-03 MED ORDER — IOPAMIDOL (ISOVUE-370) INJECTION 76%
INTRAVENOUS | Status: AC
  Administered 2017-06-03: 50 mL
  Filled 2017-06-03: qty 50

## 2017-06-03 MED ORDER — CLOPIDOGREL BISULFATE 75 MG PO TABS: 75.0000 mg | ORAL_TABLET | Freq: Once | ORAL | Status: DC

## 2017-06-03 MED ORDER — ROSUVASTATIN CALCIUM 20 MG PO TABS
20.0000 mg | ORAL_TABLET | Freq: Every day | ORAL | Status: DC
  Administered 2017-06-04 (×2): 20 mg via ORAL
  Filled 2017-06-03 (×2): qty 1

## 2017-06-03 MED ORDER — ACETAMINOPHEN 325 MG PO TABS: 650.0000 mg | ORAL_TABLET | ORAL | Status: DC | PRN

## 2017-06-03 MED ORDER — ASPIRIN 325 MG PO TABS
325.0000 mg | ORAL_TABLET | Freq: Every day | ORAL | Status: DC
  Administered 2017-06-04 – 2017-06-05 (×2): 325 mg via ORAL
  Filled 2017-06-03 (×2): qty 1

## 2017-06-03 MED ORDER — CLOPIDOGREL BISULFATE 75 MG PO TABS
225.0000 mg | ORAL_TABLET | Freq: Once | ORAL | Status: AC
  Administered 2017-06-04: 225 mg via ORAL
  Filled 2017-06-03: qty 3

## 2017-06-03 MED ORDER — INSULIN ASPART 100 UNIT/ML ~~LOC~~ SOLN: 0.0000 [IU] | Freq: Every day | SUBCUTANEOUS | Status: DC

## 2017-06-03 NOTE — ED Provider Notes (Signed)
[facility] 3W PROGRESSIVE CARE Provider Note   CSN: [id number] Arrival date & time: 06/03/17  1859     History   Chief Complaint Chief Complaint  Patient presents with  . Extremity Weakness    Right sided  . Aphasia    HPI Anthony Allen is a 81 y.o. male.  The history is provided by the patient, the spouse, medical records and a relative.  Neurologic Problem  This is a new problem. The current episode started yesterday. The problem occurs constantly. The problem has not changed since onset.Pertinent negatives include no chest pain, no abdominal pain and no shortness of breath. Associated symptoms comments: Increased weakness & speech dysarthria. Nothing aggravates the symptoms. Nothing relieves the symptoms. He has tried nothing for the symptoms.    Past Medical History:  Diagnosis Date  . Glaucoma   . Gout   . Hypertension   . Type 2 diabetes mellitus with hyperglycemia [facility])     Patient Active Problem List   Diagnosis Date Noted  . CKD (chronic kidney disease), stage III ([place]) 06/03/2017  . 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 Stroke (cerebrum) ([place]) 06/03/2017  . CVA (cerebral vascular accident) ([place]) 06/01/2017  . TIA (transient ischemic attack) 05/31/2017  . Fatigue 05/08/2017  . Aortic atherosclerosis ([place]) 05/08/2017  . Glaucoma 05/06/2017  . Lipoma of forehead 11/02/2016  . Type II diabetes mellitus with renal manifestations ([place]) 11/02/2016  . Hyperlipidemia LDL goal <100 11/02/2016  . Constipation in male 11/02/2016  . Insomnia due to anxiety and fear 11/02/2016  . Colon cancer screening 05/04/2015  . Vitamin D deficiency 05/02/2015  . Medicare annual wellness visit, subsequent 06/23/2013  . Essential hypertension, benign 03/22/2013  . Osteoarthritis 03/22/2013  . Numbness and tingling in left hand 03/22/2013  . GERD (gastroesophageal reflux disease) 03/22/2013  . BPH (benign prostatic hyperplasia) 03/22/2013    Past Surgical History:    Procedure Laterality Date  . TONSILLECTOMY         Home Medications    Prior to Admission medications   Medication Sig Start Date End Date Taking? Authorizing Provider  aspirin EC 81 MG tablet Take 81 mg by mouth daily.   Yes [provider]  Aspirin-Salicylamide-Caffeine (BC HEADACHE POWDER PO) Take 1 packet by mouth daily as needed (for headaches or pain).    Yes [provider]  Cholecalciferol (VITAMIN D-3) 1000 units CAPS Take 1,000 Units by mouth daily.   Yes [provider]  empagliflozin (JARDIANCE) 10 MG TABS tablet Take 10 mg by mouth daily. 05/06/17  Yes Crecencio Mc, MD  fluticasone (FLONASE) 50 MCG/ACT nasal spray Place 2 sprays into both nostrils daily as needed for allergies or rhinitis.    Yes [provider]  HYDROCORTISONE, TOPICAL, 2 % LOTN Apply 1 application topically as needed (itching).   Yes [provider]  latanoprost (XALATAN) 0.005 % ophthalmic solution Instill 1 drop into both eyes in the evening 07/11/16  Yes [provider]  losartan (COZAAR) 50 MG tablet Take 1 tablet (50 mg total) by mouth daily. 05/09/17  Yes Crecencio Mc, MD  multivitamin-lutein [facility]) CAPS capsule Take 1 capsule by mouth daily.   Yes [provider]  rosuvastatin (CRESTOR) 5 MG tablet Take 5 mg by mouth daily. 05/09/17  Yes [provider]  timolol (TIMOPTIC) 0.5 % ophthalmic solution Instill 1 drop into the right eye in the morning 07/11/16  Yes [provider]  clopidogrel (PLAVIX) 75 MG tablet Take 1 tablet (  75 mg total) by mouth daily. 06/03/17   Henreitta Leber, MD  glucose blood test strip Use as instructed 01/30/17   Crecencio Mc, MD  Lancets [facility] ULTRASOFT) lancets Use as instructed 01/30/17   Crecencio Mc, MD  rosuvastatin (CRESTOR) 20 MG tablet Take 1 tablet (20 mg total) by mouth daily. 06/02/17   Henreitta Leber, MD    Family History Family History  Problem Relation  Age of Onset  . Arthritis Mother   . Stroke Father   . Hypertension Father   . Heart disease Father 36       AMI  . Kidney disease Father        bladder ca congenital loss of kidney  . Arthritis Maternal Grandmother   . Early death Daughter 16       aspiration  . Cancer Brother        Scalp    Social History Social History   Tobacco Use  . Smoking status: Never Smoker  . Smokeless tobacco: Never Used  Substance Use Topics  . Alcohol use: Yes    Comment: occ  . Drug use: No     Allergies   Patient has no known allergies.   Review of Systems Review of Systems  Constitutional: Negative for chills and fever.  HENT: Negative for rhinorrhea and sore throat.   Eyes: Negative for visual disturbance.  Respiratory: Negative for shortness of breath.   Cardiovascular: Negative for chest pain.  Gastrointestinal: Negative for abdominal pain, nausea and vomiting.  Genitourinary: Negative for dysuria.  Musculoskeletal: Positive for gait problem (recent CVA). Negative for arthralgias and back pain.  Skin: Negative for rash.  Neurological: Positive for speech difficulty (recent CVA) and weakness (recent CVA). Negative for seizures and syncope.  Psychiatric/Behavioral: Negative for confusion.  All other systems reviewed and are negative.    Physical Exam Updated Vital Signs BP (!) 158/67 (BP Location: Right Arm)   Pulse 61   Temp 97.8 F (36.6 C) (Oral)   Resp 20   Ht 5\' 9"  (1.753 m)   Wt 72.7 kg (160 lb 4.4 oz)   SpO2 98%   BMI 23.67 kg/m   Physical Exam  Constitutional: He is oriented to person, place, and time. He appears well-developed and well-nourished.  HENT:  Head: Normocephalic and atraumatic.  Eyes: Conjunctivae and EOM are normal. Pupils are equal, round, and reactive to light.  Neck: Neck supple.  Cardiovascular: Normal rate and regular rhythm.  No murmur heard. Pulmonary/Chest: Effort normal and breath sounds normal. No respiratory distress.   Abdominal: Soft. There is no tenderness.  Musculoskeletal: He exhibits no edema or deformity.  Neurological: He is alert and oriented to person, place, and time.  CN 2-12 grossly intact w/mild dysarthria, 5/5 strength in LUE/LLE, 4/5 strength in RUE/RLE, no focal sensory deficits, difficulty with FTN & HTS on the right  Skin: Skin is warm and dry.  Psychiatric: He has a normal mood and affect.  Nursing note and vitals reviewed.    ED Treatments / Results  Labs (all labs ordered are listed, but only abnormal results are displayed) Labs Reviewed  CBC - Abnormal; Notable for the following components:      Result Value   Hemoglobin 18.8 (*)    MCHC 36.2 (*)    All other components within normal limits  COMPREHENSIVE METABOLIC PANEL - Abnormal; Notable for the following components:   Glucose, Bld 167 (*)    BUN 31 (*)  Creatinine, Ser 1.64 (*)    Total Bilirubin 1.4 (*)    GFR calc non Af Amer 38 (*)    GFR calc Af Amer 44 (*)    All other components within normal limits  GLUCOSE, CAPILLARY - Abnormal; Notable for the following components:   Glucose-Capillary 134 (*)    All other components within normal limits  I-STAT CHEM 8, ED - Abnormal; Notable for the following components:   BUN 33 (*)    Creatinine, Ser 1.60 (*)    Glucose, Bld 164 (*)    Hemoglobin 18.0 (*)    HCT 53.0 (*)    All other components within normal limits  PROTIME-INR  APTT  DIFFERENTIAL  JAK2  V617F QUAL. WITH REFLEX TO EXON 12  I-STAT TROPONIN, ED  CBG MONITORING, ED    EKG  EKG Interpretation  Date/Time:  Tuesday June 03 2017 19:06:00 EST Ventricular Rate:  76 PR Interval:    QRS Duration: 86 QT Interval:  402 QTC Calculation: 452 R Axis:   -10 Text Interpretation:  Sinus rhythm Probable inferior infarct, old Artifact in lead(s) I II aVR aVL aVF V1 V2 V3 V4 V5 V6 No significant change since last tracing Confirmed by Wandra Arthurs [phone]) on 06/03/2017 7:39:42 PM       Radiology Ct  Angio Head W Or Wo Contrast  Result Date: 06/03/2017 CLINICAL DATA:  Recent stroke. Continued right-sided weakness slurred speech. EXAM: CT ANGIOGRAPHY HEAD AND NECK TECHNIQUE: Multidetector CT imaging of the head and neck was performed using the standard protocol during bolus administration of intravenous contrast. Multiplanar CT image reconstructions and MIPs were obtained to evaluate the vascular anatomy. Carotid stenosis measurements (when applicable) are obtained utilizing NASCET criteria, using the distal internal carotid diameter as the denominator. CONTRAST:  42mL ISOVUE-370 IOPAMIDOL (ISOVUE-370) INJECTION 76% COMPARISON:  Head CT and brain MRI from 2 days ago FINDINGS: CTA NECK FINDINGS Aortic arch: Atherosclerosis. Three vessel branching. No acute finding. Right carotid system: Moderate primarily calcified plaque at the ICA bulb without flow limiting stenosis or ulceration. Left carotid system: Moderate calcified and noncalcified plaque of the proximal ICA without flow limiting stenosis or ulceration. Vertebral arteries: Prominent irregular and ulcerated plaque in the proximal left subclavian artery. 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 The dominant right vertebral artery shows no flow limiting stenosis or ulceration. Skeleton: Excavation of the left mandible body contiguous with mucosa at the alveolar ridge. Other neck: There is a known left deep parotid and parapharyngeal mass has better seen by prior MRI and enhanced neck CT, approximately 4 x 1 cm. Subgaleal lipoma of the forehead. Upper chest: No acute finding Review of the MIP images confirms the above findings CTA HEAD FINDINGS Anterior circulation: Atherosclerotic plaque on the carotid siphons. No major branch occlusion. Chronic azygos appearance of the left A2 and aplastic left A1 segment. A small truncated right A2 segment is visible by CTA. Negative for  aneurysm. Posterior circulation: Left vertebral occlusion of the dura. Atherosclerosis of the right V4 segment. Moderate narrowing of the proximal basilar. Fetal type left PCA. Atherosclerotic irregularity of bilateral PCA with bilateral distal P2 with severe stenosis, with flow gap on the left. Right PICA stenosis that was better seen on previous MRA. Venous sinuses: Patent Anatomic variants: As above Delayed phase: No abnormal intracranial enhancement. A left pontine acute infarct by MRI is subtle by CT. Remote right basal ganglia  infarct. Review of the MIP images confirms the above findings IMPRESSION: 1. No acute intracranial finding when compared to brain MRI and MRA 2 days prior. 2. A known acute left pontine infarct is subtle by CT. 3. 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 Notable irregular plaque at the proximal left subclavian. 4. 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 5. Known left parapharyngeal and deep parotid mass/neoplasm. ENT referral has been recommended previously. 6. Alveolar ridge and body erosion of the left mandible, recommend mucosal exam to exclude underlying destructive lesion. Electronically Signed   By: Monte Fantasia M.D.   On: 06/03/2017 21:48   Ct Head Wo Contrast  Result Date: 06/03/2017 CLINICAL DATA:  Right-sided weakness, slurred speech. EXAM: CT HEAD WITHOUT CONTRAST TECHNIQUE: Contiguous axial images were obtained from the base of the skull through the vertex without intravenous contrast. COMPARISON:  CT scan of May 31, 2017. MRI of June 01, 2017. FINDINGS: Brain: Mild chronic ischemic white matter disease is noted. Faint low density is noted in left pons which may correspond to infarction identified on prior MRI. No mass effect or midline shift is noted. Ventricular size is within normal limits. No hemorrhage or mass lesion is noted. Vascular: No hyperdense vessel or unexpected calcification. Skull: Normal. Negative for fracture or focal  lesion. Sinuses/Orbits: No acute finding. Other: None. IMPRESSION: Mild chronic ischemic white matter disease. Possible low density seen in left pons which may correspond to infarction seen on prior MRI. No other abnormality is seen. Electronically Signed   By: Marijo Conception, M.D.   On: 06/03/2017 20:52   Ct Angio Neck W Or Wo Contrast  Result Date: 06/03/2017 CLINICAL DATA:  Recent stroke. Continued right-sided weakness slurred speech. EXAM: CT ANGIOGRAPHY HEAD AND NECK TECHNIQUE: Multidetector CT imaging of the head and neck was performed using the standard protocol during bolus administration of intravenous contrast. Multiplanar CT image reconstructions and MIPs were obtained to evaluate the vascular anatomy. Carotid stenosis measurements (when applicable) are obtained utilizing NASCET criteria, using the distal internal carotid diameter as the denominator. CONTRAST:  36mL ISOVUE-370 IOPAMIDOL (ISOVUE-370) INJECTION 76% COMPARISON:  Head CT and brain MRI from 2 days ago FINDINGS: CTA NECK FINDINGS Aortic arch: Atherosclerosis. Three vessel branching. No acute finding. Right carotid system: Moderate primarily calcified plaque at the ICA bulb without flow limiting stenosis or ulceration. Left carotid system: Moderate calcified and noncalcified plaque of the proximal ICA without flow limiting stenosis or ulceration. Vertebral arteries: Prominent irregular and ulcerated plaque in the proximal left subclavian artery. 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 The dominant right vertebral artery shows no flow limiting stenosis or ulceration. Skeleton: Excavation of the left mandible body contiguous with mucosa at the alveolar ridge. Other neck: There is a known left deep parotid and parapharyngeal mass has better seen by prior MRI and enhanced neck CT, approximately 4 x 1 cm. Subgaleal lipoma of the forehead. Upper chest: No acute  finding Review of the MIP images confirms the above findings CTA HEAD FINDINGS Anterior circulation: Atherosclerotic plaque on the carotid siphons. No major branch occlusion. Chronic azygos appearance of the left A2 and aplastic left A1 segment. A small truncated right A2 segment is visible by CTA. Negative for aneurysm. Posterior circulation: Left vertebral occlusion of the dura. Atherosclerosis of the right V4 segment. Moderate narrowing of the proximal basilar. Fetal type left PCA. Atherosclerotic irregularity of bilateral PCA with bilateral  distal P2 with severe stenosis, with flow gap on the left. Right PICA stenosis that was better seen on previous MRA. Venous sinuses: Patent Anatomic variants: As above Delayed phase: No abnormal intracranial enhancement. A left pontine acute infarct by MRI is subtle by CT. Remote right basal ganglia infarct. Review of the MIP images confirms the above findings IMPRESSION: 1. No acute intracranial finding when compared to brain MRI and MRA 2 days prior. 2. A known acute left pontine infarct is subtle by CT. 3. 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 Notable irregular plaque at the proximal left subclavian. 4. 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 5. Known left parapharyngeal and deep parotid mass/neoplasm. ENT referral has been recommended previously. 6. Alveolar ridge and body erosion of the left mandible, recommend mucosal exam to exclude underlying destructive lesion. Electronically Signed   By: Monte Fantasia M.D.   On: 06/03/2017 21:48    Procedures Procedures (including critical care time)  Medications Ordered in ED Medications  cholecalciferol (VITAMIN D) tablet 1,000 Units (not administered)  clopidogrel (PLAVIX) tablet 75 mg (not administered)  fluticasone (FLONASE) 50 MCG/ACT nasal spray 2 spray (not administered)  hydrocortisone cream 1 % 1 application (not administered)  latanoprost (XALATAN) 0.005 % ophthalmic solution 1 drop  (not administered)  losartan (COZAAR) tablet 50 mg (not administered)   stroke: mapping our early stages of recovery book (not administered)  0.9 %  sodium chloride infusion (not administered)  acetaminophen (TYLENOL) tablet 650 mg (not administered)    Or  acetaminophen (TYLENOL) solution 650 mg (not administered)    Or  acetaminophen (TYLENOL) suppository 650 mg (not administered)  senna-docusate (Senokot-S) tablet 1 tablet (not administered)  enoxaparin (LOVENOX) injection 40 mg (not administered)  aspirin suppository 150 mg ( Rectal See Alternative 06/03/17 2350)    Or  aspirin tablet 325 mg (325 mg Oral Not Given 06/03/17 2350)  insulin aspart (novoLOG) injection 0-9 Units (not administered)  insulin aspart (novoLOG) injection 0-5 Units (not administered)  hydrALAZINE (APRESOLINE) injection 5 mg (not administered)  ondansetron (ZOFRAN) injection 4 mg (not administered)  zolpidem (AMBIEN) tablet 5 mg (not administered)  multivitamin (PROSIGHT) tablet 1 tablet (not administered)  rosuvastatin (CRESTOR) tablet 20 mg (not administered)  timolol (TIMOPTIC) 0.5 % ophthalmic solution 1 drop (not administered)  aspirin-acetaminophen-caffeine (EXCEDRIN MIGRAINE) per tablet 1 tablet (not administered)  clopidogrel (PLAVIX) tablet 225 mg (not administered)  iopamidol (ISOVUE-370) 76 % injection (50 mLs  Contrast Given 06/03/17 2120)     Initial Impression / Assessment and Plan / ED Course  I have reviewed the triage vital signs and the nursing notes.  Pertinent labs & imaging results that were available during my care of the patient were reviewed by me and considered in my medical decision making (see chart for details).     Pt with h/o CVA this weekend presents with worsening weakness & dysarthria. Family says the Pt was able to walk w/assistance in the hospital prior to discharge, but since leaving last night he has been markedly weaker and had more dysarthria. Family says they were  discharged w/plans for home health, but nobody showed up today, and they were unable to fill the Pt's new prescriptions last night. Family friends that are physicians saw the Pt today and recommended that he come here for evaluation as he will likely need readmission; the Pt lives with his elderly wife and a son that also requires care from his parents.  VS & exam as above. EKG: NSR @  76bpm w/significant artifact, but no obvious ischemia. Labs remarkable for Crt 1.60 (near Pt's baseline). CT head w/NAICA.  Neurology consulted & reviewed the Pt's case; recommending CTA head & neck, but believe there is no additional intervention needed from a neurological perspective. Plavix load ordered at Neurology's request (chart review shows the Pt never received it at the OSH).  Will admit the Pt to the Hospitalist for further evaluation and treatment.  Final Clinical Impressions(s) / ED Diagnoses   Final diagnoses:  Cerebrovascular accident (CVA), unspecified mechanism [facility])    ED Discharge Orders    None       Jenny Reichmann, MD 06/03/17 2354    Drenda Freeze, MD 06/05/17 1944

## 2017-06-03 NOTE — Progress Notes (Addendum)
Was given report from ED RN Marchia Bond.  Called Pharmacy and need MD clarification for Plavix order.  Judson Roch was not available as patient is in transit to 3W  Sent Msg to Traid MD asking for order clarification of Plavix 75mg , 225mg     [facility] MD on call returned message and referred me to Attending MD for order clafication.

## 2017-06-03 NOTE — Progress Notes (Addendum)
Msg sent to IM Dr. Johny Chess to clarify Plavix dose to be given.    Dr. Johny Chess referred me to [phone]

## 2017-06-03 NOTE — Progress Notes (Signed)
Patient failed swqallow screen, but was recently evaluated by ST at [facility].   I think that he should go ahead and receive plavix load, therefore would give as per ST recommendation on 12/23(Nectar thick liquids, consider crushing meds).  Could have ST re-eval prior to reinstating full diet.   Roland Rack, MD [facility] [phone]  If 7pm- 7am, please page neurology on call as listed in [place].

## 2017-06-03 NOTE — Consult Note (Signed)
Neurology Consultation Reason for Consult: Stroke Referring Physician: Darl Householder, D  CC: Stroke  History is obtained from:Patient, family  HPI: Anthony Allen is a 81 y.o. male  Who was recently admitted at [facility] for a pontine stroke. He was apparently doing reasonably well there and was discharged yesterday. 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 He had been started on plavix, at [place] but not loaded.   He describes waxing/waning right sided weakness and significant worsening since leaving yesterday. He was apparently able to walk with minimal assistance while there, but needed 3 people to get him from the car to his house after discharge.   This morning, he was unable to lift his right arm at all.   LKW: Thursday tpa given?: no, out of window.   ROS: A 14 point ROS was performed and is negative except as noted in the HPI.   Past Medical History:  Diagnosis Date  . Glaucoma   . Gout   . Hypertension   . Type 2 diabetes mellitus with hyperglycemia (H[facility])      Family History  Problem Relation Age of Onset  . Arthritis Mother   . Stroke Father   . Hypertension Father   . Heart disease Father 88       AMI  . Kidney disease Father        bladder ca congenital loss of kidney  . Arthritis Maternal Grandmother   . Early death Daughter 16       aspiration  . Cancer Brother        Scalp     Social History:  reports that  has never smoked. he has never used smokeless tobacco. He reports that he drinks alcohol. He reports that he does not use drugs.   Exam: Current vital signs: BP (!) 158/67 (BP Location: Right Arm)   Pulse 61   Temp 97.8 F (36.6 C) (Oral)   Resp 20   Ht 5\' 9"  (1.753 m)   Wt 72.7 kg (160 lb 4.4 oz)   SpO2 98%   BMI 23.67 kg/m  Vital signs in last 24 hours: Temp:  [97.7 F (36.5 C)-97.8 F (36.6 C)] 97.8 F (36.6 C) (12/25 2250) Pulse Rate:  [61-74] 61 (12/25 2250) Resp:   [13-22] 20 (12/25 2250) BP: (133-160)/(66-84) 158/67 (12/25 2250) SpO2:  [92 %-99 %] 98 % (12/25 2250) Weight:  [72.7 kg (160 lb 4.4 oz)] 72.7 kg (160 lb 4.4 oz) (12/25 2250)   Physical Exam  Constitutional: Appears well-developed and well-nourished.  Psych: Affect appropriate to situation Eyes: No scleral injection HENT: No OP obstrucion Head: Normocephalic.  Cardiovascular: Normal rate and regular rhythm.  Respiratory: Effort normal, non-labored breathing GI: Soft.  No distension. There is no tenderness.  Skin: WDI  Neuro: Mental Status: Patient is awake, alert, oriented to person, place, month, year, and situation. Patient is able to give a clear and coherent history. No signs of aphasia or neglect He has a moderate dysarthria Cranial Nerves: II: Visual Fields are full. Pupils are equal, round, and reactive to light.   III,IV, VI: EOMI without ptosis or diploplia.  V: Facial sensation is symmetric to temperature VII: Facial movement is symmetric.  VIII: hearing is intact to voice X: Uvula elevates symmetrically XI: Shoulder shrug is symmetric. XII: tongue is midline without atrophy or fasciculations.  Motor: Tone is normal. Bulk is normal. 4/5 right hemiparesis Sensory: Sensation is symmetric to light touch  and temperature in the arms and legs. Cerebellar: FNF and HKS are intact on the left, ataxic on the right.    I have reviewed labs in epic and the results pertinent to this consultation are: Creatinine 1.6 Echo - no embolic source identified.   LDL 38 A1 C 6.6   I have reviewed the images obtained: CTA head and neck - moderate basilar stenosis, no change.   Impression: 81 yo M with waxing/waning weakness associated with lacunar infarct. I  Agree with dual antiplatelet therapy, and would favor loading with plavix at this time given the intermittent symptoms.   Recommendations: 1) ASA 81 mg daily, plavix 75mg  daily following 300mg  load 2) PT,OT 3)  Telemetry 4) stroke team to follow.    Roland Rack, MD [facility] [phone]  If 7pm- 7am, please page neurology on call as listed in [place].

## 2017-06-03 NOTE — ED Notes (Signed)
Pt returned from CT and found to bed wet of urine; linen changed and pericare performed; condom cath applied

## 2017-06-03 NOTE — H&P (Addendum)
History and Physical    Jerry Clyne OXB:[id number] DOB: Jul 01, 1935 DOA: 06/03/2017  Referring MD/NP/PA:   PCP: Crecencio Mc, MD   Patient coming from:  The patient is coming from home.  At baseline, pt is partially dependent for most of ADL.   Chief Complaint: Right-sided weakness, slurred speech, difficulty swallowing  HPI: Jaecion Dempster is a 81 y.o. male with medical history significant of hypertension, hyperlipidemia, diabetes mellitus, gout, glaucoma and newly diagnosed stroke, who presents with right-sided weakness, slurred speech, difficulty swallowing.  Pt was hospitalized from 12/22-12/24 due to left-sided weakness, slurred speech and difficulty swallowing. He was diagnosed as left pontine stroke. Pt had old stroke workup including MRI/MRA, carotid artery Doppler and 2-D echo. Per discharge summery, MRI of the brain showed left-sided pontine CVA. MRA which showed some vertebral stenosis on the left side. Dual antiplatelet therapy with aspirin and Plavix were started per neurologist recommendation. Carotid duplex showed no evidence of hemodynamically significant carotid artery stenosis. 2d echo showed normal EF with no evidence of intracardiac thrombus. Patient was also seen by speech therapy and placed on a dysphagia 2 diet with nectar thick liquids. Pt was just discharged home yesterday afternoon. Per his wife, patient continues to have difficulty swallowing. His right sided weakness seems to have worsened. No vision or hearing loss. No facial droop. Patient does not have chest pain, shortness of breath, GI symptoms, symptoms of UTI. No fever or chills.  ED Course: pt was found to have WBC 8.9, INR 1.05, PTT 31, elevated Hgb (Hgb 18 and Hct 53), negative troponin, stable renal function, no tachycardia, slightly tachypnea, oxygen sat 95% on room air. CT head did not show hemorrhagic conversion. Patient is admitted to telemetry bed as inpatient. Neurology, Dr. Leonel Ramsay was  consulted.  Review of Systems:   General: no fevers, chills, no body weight gain, has fatigue HEENT: no blurry vision, hearing changes or sore throat Respiratory: no dyspnea, coughing, wheezing CV: no chest pain, no palpitations GI: no nausea, vomiting, abdominal pain, diarrhea, constipation GU: no dysuria, burning on urination, increased urinary frequency, hematuria  Ext: no leg edema Neuro: No vision change or hearing loss. Has right sided weakness, slurred speech and difficulty swallowing Skin: no rash, no skin tear. MSK: No muscle spasm, no deformity, no limitation of range of movement in spin Heme: No easy bruising.  Travel history: No recent long distant travel.  Allergy: No Known Allergies  Past Medical History:  Diagnosis Date  . Glaucoma   . Gout   . Hypertension   . Type 2 diabetes mellitus with hyperglycemia [facility])     Past Surgical History:  Procedure Laterality Date  . TONSILLECTOMY      Social History:  reports that  has never smoked. he has never used smokeless tobacco. He reports that he drinks alcohol. He reports that he does not use drugs.  Family History:  Family History  Problem Relation Age of Onset  . Arthritis Mother   . Stroke Father   . Hypertension Father   . Heart disease Father 57       AMI  . Kidney disease Father        bladder ca congenital loss of kidney  . Arthritis Maternal Grandmother   . Early death Daughter 16       aspiration  . Cancer Brother        Scalp     Prior to Admission medications   Medication Sig Start Date End Date Taking? Authorizing Provider  aspirin EC 81 MG tablet Take 81 mg by mouth daily.   Yes [provider]  Aspirin-Salicylamide-Caffeine (BC HEADACHE POWDER PO) Take 1 packet by mouth daily as needed (for headaches or pain).    Yes [provider]  empagliflozin (JARDIANCE) 10 MG TABS tablet Take 10 mg by mouth daily. 05/06/17  Yes Crecencio Mc, MD  HYDROCORTISONE, TOPICAL, 2 % LOTN  Apply 1 application topically as needed (itching).   Yes [provider]  losartan (COZAAR) 50 MG tablet Take 1 tablet (50 mg total) by mouth daily. 05/09/17  Yes Crecencio Mc, MD  rosuvastatin (CRESTOR) 5 MG tablet Take 5 mg by mouth daily. 05/09/17  Yes [provider]  amLODipine (NORVASC) 10 MG tablet Take 10 mg by mouth daily.  05/03/17   [provider]  cholecalciferol (VITAMIN D) 1000 units tablet Take 1,000 Units by mouth daily.    [provider]  clopidogrel (PLAVIX) 75 MG tablet Take 1 tablet (75 mg total) by mouth daily. 06/03/17   Henreitta Leber, MD  fluticasone (FLONASE) 50 MCG/ACT nasal spray Place 2 sprays into both nostrils daily as needed for allergies or rhinitis.     [provider]  glucose blood test strip Use as instructed 01/30/17   Crecencio Mc, MD  Lancets [facility] ULTRASOFT) lancets Use as instructed 01/30/17   Crecencio Mc, MD  latanoprost (XALATAN) 0.005 % ophthalmic solution Instill 1 drop into the right eye at bedtime 07/11/16   [provider]  multivitamin-lutein (OCUVITE-LUTEIN) CAPS capsule Take 1 capsule by mouth daily.    [provider]  rosuvastatin (CRESTOR) 20 MG tablet Take 1 tablet (20 mg total) by mouth daily. 06/02/17   Henreitta Leber, MD  timolol (TIMOPTIC) 0.5 % ophthalmic solution Instill 1 drop into the right eye once a day 07/11/16   [provider]    Physical Exam: Vitals:   06/03/17 2045 06/03/17 2100 06/03/17 2115 06/03/17 2215  BP: (!) 160/77 133/66 138/71 (!) 148/74  Pulse: 63 68 64 74  Resp: 20 19 17  (!) 22  Temp:      TempSrc:      SpO2: 99% 94% 97% 92%   General: Not in acute distress HEENT:       Eyes: PERRL, EOMI, no scleral icterus.       ENT: No discharge from the ears and nose, no pharynx injection, no tonsillar enlargement.        Neck: No JVD, no bruit, no mass felt. Heme: No neck lymph node enlargement. Cardiac: S1/S2, RRR, No murmurs, No  gallops or rubs. Respiratory: No rales, wheezing, rhonchi or rubs. GI: Soft, nondistended, nontender, no rebound pain, no organomegaly, BS present. GU: No hematuria Ext: No pitting leg edema bilaterally. 2+DP/PT pulse bilaterally. Musculoskeletal: No joint deformities, No joint redness or warmth, no limitation of ROM in spin. Skin: No rashes.  Neuro: Alert, oriented X3, cranial nerves II-XII grossly intact. Muscle strength 3/5 in right leg and 2/5 in right arm. sensation to light touch intact. Brachial reflex 2+ bilaterally. Negative Babinski's sign.  Psych: Patient is not psychotic, no suicidal or hemocidal ideation.  Labs on Admission: I have personally reviewed following labs and imaging studies  CBC: Recent Labs  Lab 05/31/17 1044 06/03/17 1923 06/03/17 1939  WBC 8.3 8.9  --   NEUTROABS 6.6* 6.4  --   HGB 16.3 18.8* 18.0*  HCT 47.8 52.0 53.0*  MCV 94.7 93.0  --   PLT 155  197  --    Basic Metabolic Panel: Recent Labs  Lab 05/31/17 1044 06/03/17 1923 06/03/17 1939  NA 138 138 141  K 3.9 4.2 4.2  CL 108 103 104  CO2 23 24  --   GLUCOSE 202* 167* 164*  BUN 17 31* 33*  CREATININE 1.57* 1.64* 1.60*  CALCIUM 9.2 10.1  --    GFR: Estimated Creatinine Clearance: 37.3 mL/min (A) (by C-G formula based on SCr of 1.6 mg/dL (H)). Liver Function Tests: Recent Labs  Lab 05/31/17 1044 06/03/17 1923  AST 31 35  ALT 41 37  ALKPHOS 84 79  BILITOT 1.1 1.4*  PROT 6.7 7.1  ALBUMIN 3.7 4.0   No results for input(s): LIPASE, AMYLASE in the last 168 hours. No results for input(s): AMMONIA in the last 168 hours. Coagulation Profile: Recent Labs  Lab 06/03/17 1923  INR 1.05   Cardiac Enzymes: Recent Labs  Lab 05/31/17 1044  TROPONINI <0.03   BNP (last 3 results) No results for input(s): PROBNP in the last 8760 hours. HbA1C: Recent Labs    06/01/17 0428  HGBA1C 6.6*   CBG: Recent Labs  Lab 06/01/17 1145 06/01/17 1659 06/01/17 2106 06/02/17 0730 06/02/17 1134    GLUCAP 160* 197* 136* 128* 220*   Lipid Profile: Recent Labs    06/01/17 0428  CHOL 101  HDL 29*  LDLCALC 38  TRIG 169*  CHOLHDL 3.5   Thyroid Function Tests: Recent Labs    06/01/17 0428  TSH 1.561   Anemia Panel: No results for input(s): VITAMINB12, FOLATE, FERRITIN, TIBC, IRON, RETICCTPCT in the last 72 hours. Urine analysis:    Component Value Date/Time   COLORURINE STRAW (A) 05/31/2017 1803   APPEARANCEUR CLEAR (A) 05/31/2017 1803   LABSPEC 1.013 05/31/2017 1803   PHURINE 5.0 05/31/2017 1803   GLUCOSEU >=500 (A) 05/31/2017 1803   HGBUR NEGATIVE 05/31/2017 1803   BILIRUBINUR NEGATIVE 05/31/2017 1803   KETONESUR 5 (A) 05/31/2017 1803   PROTEINUR NEGATIVE 05/31/2017 1803   NITRITE NEGATIVE 05/31/2017 1803   LEUKOCYTESUR NEGATIVE 05/31/2017 1803   Sepsis Labs: @LABRCNTIP (procalcitonin:4,lacticidven:4) )No results found for this or any previous visit (from the past 240 hour(s)).   Radiological Exams on Admission: Ct Angio Head W Or Wo Contrast  Result Date: 06/03/2017 CLINICAL DATA:  Recent stroke. Continued right-sided weakness slurred speech. EXAM: CT ANGIOGRAPHY HEAD AND NECK TECHNIQUE: Multidetector CT imaging of the head and neck was performed using the standard protocol during bolus administration of intravenous contrast. Multiplanar CT image reconstructions and MIPs were obtained to evaluate the vascular anatomy. Carotid stenosis measurements (when applicable) are obtained utilizing NASCET criteria, using the distal internal carotid diameter as the denominator. CONTRAST:  41mL ISOVUE-370 IOPAMIDOL (ISOVUE-370) INJECTION 76% COMPARISON:  Head CT and brain MRI from 2 days ago FINDINGS: CTA NECK FINDINGS Aortic arch: Atherosclerosis. Three vessel branching. No acute finding. Right carotid system: Moderate primarily calcified plaque at the ICA bulb without flow limiting stenosis or ulceration. Left carotid system: Moderate calcified and noncalcified plaque of the  proximal ICA without flow limiting stenosis or ulceration. Vertebral arteries: Prominent irregular and ulcerated plaque in the proximal left subclavian artery. 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 The dominant right vertebral artery shows no flow limiting stenosis or ulceration. Skeleton: Excavation of the left mandible body contiguous with mucosa at the alveolar ridge. Other neck: There is a known left deep parotid and parapharyngeal mass  has better seen by prior MRI and enhanced neck CT, approximately 4 x 1 cm. Subgaleal lipoma of the forehead. Upper chest: No acute finding Review of the MIP images confirms the above findings CTA HEAD FINDINGS Anterior circulation: Atherosclerotic plaque on the carotid siphons. No major branch occlusion. Chronic azygos appearance of the left A2 and aplastic left A1 segment. A small truncated right A2 segment is visible by CTA. Negative for aneurysm. Posterior circulation: Left vertebral occlusion of the dura. Atherosclerosis of the right V4 segment. Moderate narrowing of the proximal basilar. Fetal type left PCA. Atherosclerotic irregularity of bilateral PCA with bilateral distal P2 with severe stenosis, with flow gap on the left. Right PICA stenosis that was better seen on previous MRA. Venous sinuses: Patent Anatomic variants: As above Delayed phase: No abnormal intracranial enhancement. A left pontine acute infarct by MRI is subtle by CT. Remote right basal ganglia infarct. Review of the MIP images confirms the above findings IMPRESSION: 1. No acute intracranial finding when compared to brain MRI and MRA 2 days prior. 2. A known acute left pontine infarct is subtle by CT. 3. 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 Notable irregular plaque at the proximal left subclavian. 4. 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 5. Known left parapharyngeal and deep parotid mass/neoplasm.  ENT referral has been recommended previously. 6. Alveolar ridge and body erosion of the left mandible, recommend mucosal exam to exclude underlying destructive lesion. Electronically Signed   By: Monte Fantasia M.D.   On: 06/03/2017 21:48   Ct Head Wo Contrast  Result Date: 06/03/2017 CLINICAL DATA:  Right-sided weakness, slurred speech. EXAM: CT HEAD WITHOUT CONTRAST TECHNIQUE: Contiguous axial images were obtained from the base of the skull through the vertex without intravenous contrast. COMPARISON:  CT scan of May 31, 2017. MRI of June 01, 2017. FINDINGS: Brain: Mild chronic ischemic white matter disease is noted. Faint low density is noted in left pons which may correspond to infarction identified on prior MRI. No mass effect or midline shift is noted. Ventricular size is within normal limits. No hemorrhage or mass lesion is noted. Vascular: No hyperdense vessel or unexpected calcification. Skull: Normal. Negative for fracture or focal lesion. Sinuses/Orbits: No acute finding. Other: None. IMPRESSION: Mild chronic ischemic white matter disease. Possible low density seen in left pons which may correspond to infarction seen on prior MRI. No other abnormality is seen. Electronically Signed   By: Marijo Conception, M.D.   On: 06/03/2017 20:52   Ct Angio Neck W Or Wo Contrast  Result Date: 06/03/2017 CLINICAL DATA:  Recent stroke. Continued right-sided weakness slurred speech. EXAM: CT ANGIOGRAPHY HEAD AND NECK TECHNIQUE: Multidetector CT imaging of the head and neck was performed using the standard protocol during bolus administration of intravenous contrast. Multiplanar CT image reconstructions and MIPs were obtained to evaluate the vascular anatomy. Carotid stenosis measurements (when applicable) are obtained utilizing NASCET criteria, using the distal internal carotid diameter as the denominator. CONTRAST:  17mL ISOVUE-370 IOPAMIDOL (ISOVUE-370) INJECTION 76% COMPARISON:  Head CT and brain MRI  from 2 days ago FINDINGS: CTA NECK FINDINGS Aortic arch: Atherosclerosis. Three vessel branching. No acute finding. Right carotid system: Moderate primarily calcified plaque at the ICA bulb without flow limiting stenosis or ulceration. Left carotid system: Moderate calcified and noncalcified plaque of the proximal ICA without flow limiting stenosis or ulceration. Vertebral arteries: Prominent irregular and ulcerated plaque in the proximal left subclavian artery. 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 The dominant right vertebral artery shows no flow limiting stenosis or ulceration. Skeleton: Excavation of the left mandible body contiguous with mucosa at the alveolar ridge. Other neck: There is a known left deep parotid and parapharyngeal mass has better seen by prior MRI and enhanced neck CT, approximately 4 x 1 cm. Subgaleal lipoma of the forehead. Upper chest: No acute finding Review of the MIP images confirms the above findings CTA HEAD FINDINGS Anterior circulation: Atherosclerotic plaque on the carotid siphons. No major branch occlusion. Chronic azygos appearance of the left A2 and aplastic left A1 segment. A small truncated right A2 segment is visible by CTA. Negative for aneurysm. Posterior circulation: Left vertebral occlusion of the dura. Atherosclerosis of the right V4 segment. Moderate narrowing of the proximal basilar. Fetal type left PCA. Atherosclerotic irregularity of bilateral PCA with bilateral distal P2 with severe stenosis, with flow gap on the left. Right PICA stenosis that was better seen on previous MRA. Venous sinuses: Patent Anatomic variants: As above Delayed phase: No abnormal intracranial enhancement. A left pontine acute infarct by MRI is subtle by CT. Remote right basal ganglia infarct. Review of the MIP images confirms the above findings IMPRESSION: 1. No acute intracranial finding when compared to brain MRI  and MRA 2 days prior. 2. A known acute left pontine infarct is subtle by CT. 3. 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 Notable irregular plaque at the proximal left subclavian. 4. 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 5. Known left parapharyngeal and deep parotid mass/neoplasm. ENT referral has been recommended previously. 6. Alveolar ridge and body erosion of the left mandible, recommend mucosal exam to exclude underlying destructive lesion. Electronically Signed   By: Monte Fantasia M.D.   On: 06/03/2017 21:48     EKG: Independently reviewed.  Sinus rhythm, QTC 452, artificial effects, low voltage   Assessment/Plan Principal Problem:   CVA (cerebral vascular accident) ([place]) Active Problems:   Essential hypertension, benign   Type II diabetes mellitus with renal manifestations (HCC)   Hyperlipidemia LDL goal <100   Glaucoma   CKD (chronic kidney disease), stage III ([place])   Parapharyngeal space mass   Stroke (cerebrum) (HCC)   CVA (cerebral vascular accident) ([place]): pt continues to have right-sided weakness, slurred speech and difficult swallowing. Neurology was consulted, Dr. Leonel Ramsay recommended to get CT angiogram of the neck and head.   -will admit to tele bed as inpt - will follow up Neurology's Recs.  - Obtain CTA-head and neck - Continue Plavix and add ASA ( I ordered ASA per rectum in case pt dose not pass swallowing screen. If he passes swallowing screen, ASA should be change to oral 81 mg daily) - PT/OT consult - SLP  HTN:  -Continue home medications: Cozaar -IV hydralazine prn  Type II diabetes mellitus with renal manifestations ([place]): Last A1c 6.6 on 12/23, well controled. Patient is taking jardiance at home -SSI  Hyperlipidemia LDL goal <100: -Crestor  CKD (chronic kidney disease), stage III: stable. Recent baseline creatinine 1.49-1.59. His creatinine is 1.60, BUN 33. -Follow up renal function. 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  -may need to consult or give referral to ENT  Elevated hemoglobin: Hemoglobin 18.0, hematocrit 53. Etiology is not clear. This seems to be a chronic  issue. He had hemoglobin 18.0 on 01/26/13. Etiology is not clear. Will need to rule out polycythemia vera. Patient was found to have parapharyngeal mass. If it is malignancy, it may explain the elevated hemoglobin level. -will check Jak 2 mutation to r/o polycythemia vera   DVT ppx: SQ Lovenox Code Status: Full code Family Communication:  Yes, patient's wife and daughter  at bed side Disposition Plan:  Anticipate discharge back to previous home environment Consults called:  neurology, Dr. Leonel Ramsay Admission status:  Inpatient/tele       Date of Service 06/03/2017    [facility] Pager [phone]  If 7PM-7AM, please contact night-coverage [email or web address] Password [facility] 06/03/2017, 10:35 PM

## 2017-06-03 NOTE — ED Notes (Signed)
Pt back from CT

## 2017-06-03 NOTE — Progress Notes (Addendum)
paged [phone] MD Leonel Ramsay.  Spoke with MD and said to use [place] ST recommendation of crushing med in apple sace with nectar thick liquids.   Asked MD to place communication note or order specifically stating such.

## 2017-06-03 NOTE — ED Triage Notes (Signed)
Pt presents from home with continued R weakness and slurred speech and dysphagia ongoing since 2 days ago, seen and treated at [facility] and admitted for stroke workup; pt discharged yesterday at 5p and pt has had progression of symptoms since discharge per family, but unsure of when they began progressing; pt A+O currently

## 2017-06-03 NOTE — Progress Notes (Signed)
Spojke to Dr. Blaine Hamper and he said to call MD Leonel Ramsay for Plavix clarification.   Currently 2 ordered exist for Plavix, one to give PO 75mg  and another to give 225mg  (total 300mg ).  MD Leonel Ramsay Progress not states to give 300mg  po load and then 75mg  PO daily.   Patient failed swallow test, is NPO.

## 2017-06-04 ENCOUNTER — Encounter (HOSPITAL_COMMUNITY): Payer: Self-pay | Admitting: General Practice

## 2017-06-04 ENCOUNTER — Other Ambulatory Visit: Payer: Self-pay

## 2017-06-04 DIAGNOSIS — I519 Heart disease, unspecified: Secondary | ICD-10-CM | POA: Diagnosis not present

## 2017-06-04 DIAGNOSIS — R221 Localized swelling, mass and lump, neck: Secondary | ICD-10-CM | POA: Diagnosis not present

## 2017-06-04 DIAGNOSIS — N183 Chronic kidney disease, stage 3 (moderate): Secondary | ICD-10-CM

## 2017-06-04 DIAGNOSIS — E785 Hyperlipidemia, unspecified: Secondary | ICD-10-CM | POA: Diagnosis not present

## 2017-06-04 DIAGNOSIS — I6300 Cerebral infarction due to thrombosis of unspecified precerebral artery: Secondary | ICD-10-CM

## 2017-06-04 DIAGNOSIS — E1129 Type 2 diabetes mellitus with other diabetic kidney complication: Secondary | ICD-10-CM | POA: Diagnosis not present

## 2017-06-04 DIAGNOSIS — I63532 Cerebral infarction due to unspecified occlusion or stenosis of left posterior cerebral artery: Secondary | ICD-10-CM | POA: Diagnosis not present

## 2017-06-04 DIAGNOSIS — I69391 Dysphagia following cerebral infarction: Secondary | ICD-10-CM | POA: Diagnosis not present

## 2017-06-04 DIAGNOSIS — I10 Essential (primary) hypertension: Secondary | ICD-10-CM

## 2017-06-04 DIAGNOSIS — I5189 Other ill-defined heart diseases: Secondary | ICD-10-CM

## 2017-06-04 LAB — GLUCOSE, CAPILLARY
GLUCOSE-CAPILLARY: 161 mg/dL — AB (ref 65–99)
GLUCOSE-CAPILLARY: 149 mg/dL — AB (ref 65–99)
Glucose-Capillary: 237 mg/dL — ABNORMAL HIGH (ref 65–99)
Glucose-Capillary: 166 mg/dL — ABNORMAL HIGH (ref 65–99)

## 2017-06-04 MED ORDER — ENSURE ENLIVE PO LIQD
237.0000 mL | Freq: Two times a day (BID) | ORAL | Status: DC
  Administered 2017-06-04: 237 mL via ORAL

## 2017-06-04 MED ORDER — GLUCERNA SHAKE PO LIQD
237.0000 mL | Freq: Three times a day (TID) | ORAL | Status: DC
Start: 2017-06-04 — End: 2017-06-05
  Administered 2017-06-05 (×2): 237 mL via ORAL

## 2017-06-04 NOTE — Progress Notes (Signed)
NEUROHOSPITALISTS STROKE TEAM - DAILY PROGRESS NOTE   ADMISSION HISTORY:  Anthony Allen is a 81 y.o. male  Who was recently admitted at [facility] for a pontine stroke. He was apparently doing reasonably well there and was discharged yesterday. 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 He had been started on plavix, at [place] but not loaded. He describes waxing/waning right sided weakness and significant worsening since leaving yesterday. He was apparently able to walk with minimal assistance while there, but needed 3 people to get him from the car to his house after discharge. This morning, he was unable to lift his right arm at all.  LKW: Thursday. tpa given?: no, out of window.   SUBJECTIVE (INTERVAL HISTORY) Family is at the bedside. Patient is found laying in bed in NAD. Overall he feels his condition is unchanged. Voices no new complaints. No new events reported overnight.  Wife describes over 2-year history of gait abnormality, hypophonia and cognitive decline.  They did not seek medical attention for these complaints.  OBJECTIVE Lab Results: CBC:  Recent Labs  Lab 05/31/17 1044 06/03/17 1923 06/03/17 1939  WBC 8.3 8.9  --   HGB 16.3 18.8* 18.0*  HCT 47.8 52.0 53.0*  MCV 94.7 93.0  --   PLT 155 197  --    BMP: Recent Labs  Lab 05/31/17 1044 06/03/17 1923 06/03/17 1939  NA 138 138 141  K 3.9 4.2 4.2  CL 108 103 104  CO2 23 24  --   GLUCOSE 202* 167* 164*  BUN 17 31* 33*  CREATININE 1.57* 1.64* 1.60*  CALCIUM 9.2 10.1  --    Liver Function Tests:  Recent Labs  Lab 05/31/17 1044 06/03/17 1923  AST 31 35  ALT 41 37  ALKPHOS 84 79  BILITOT 1.1 1.4*  PROT 6.7 7.1  ALBUMIN 3.7 4.0   Cardiac Enzymes:  Recent Labs  Lab 05/31/17 1044  TROPONINI <0.03   Coagulation Studies:  Recent Labs    06/03/17 1923  APTT 31  INR 1.05   PHYSICAL  EXAM Temp:  [97.4 F (36.3 C)-98.1 F (36.7 C)] 97.4 F (36.3 C) (12/26 1350) Pulse Rate:  [61-82] 80 (12/26 1350) Resp:  [13-22] 18 (12/26 1350) BP: (133-160)/(58-87) 140/87 (12/26 1350) SpO2:  [92 %-99 %] 98 % (12/26 1350) Weight:  [72.7 kg (160 lb 4.4 oz)] 72.7 kg (160 lb 4.4 oz) (12/25 2250) General - Well nourished, well developed, in no apparent distress Respiratory - Lungs clear bilaterally. No wheezing. Cardiovascular - Regular rate and rhythm  Neuro: Mental Status: Patient is awake, alert, oriented to person, place, month, year, and situation. Patient is able to give a clear and coherent history. No signs of aphasia or neglect He has a moderate dysarthria Cranial Nerves: II: Visual Fields are full. Pupils are equal, round, and reactive to light.   III,IV, VI: EOMI without ptosis or diploplia.  V: Facial sensation is symmetric to temperature VII: Facial movement is symmetric.  VIII: hearing is intact to voice X: Uvula elevates symmetrically XI: Shoulder shrug is symmetric. XII: tongue is midline without atrophy or fasciculations.  Motor: Tone is normal. Bulk is normal. 3-4/5 right hemiparesis Sensory: Sensation is symmetric to light touch and temperature in the arms and legs. Cerebellar: FNF and HKS are intact on the left, impaired on the right.   IMAGING: I have personally reviewed the radiological images below and agree with the radiology interpretations.  Ct Head Wo Contrast Result Date: 06/03/2017 IMPRESSION: Mild chronic ischemic white matter disease. Possible low density seen in left pons which may correspond to infarction seen on prior MRI. No other abnormality is seen.  Ct Angio Head and Neck W Or Wo Contrast Result Date: 06/03/2017 IMPRESSION: 1. No acute intracranial finding when compared to brain MRI and MRA 2 days prior. 2. A known acute left pontine infarct is subtle by CT. 3. 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 Notable irregular plaque at the  proximal left subclavian. 4. 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 5. Known left parapharyngeal and deep parotid mass/neoplasm. ENT referral has been recommended previously. 6. Alveolar ridge and body erosion of the left mandible, recommend mucosal exam to exclude underlying destructive lesion.  Echocardiogram: 06/01/2017 Study Conclusions  Left ventricle: The cavity size was normal. Wall thickness was   normal. Systolic function was vigorous. The estimated ejection   fraction was in the range of 65% to 70%. Wall motion was normal;   there were no regional wall motion abnormalities. Doppler   parameters are consistent with abnormal left ventricular   relaxation (grade 1 diastolic dysfunction). - Mitral valve: Valve area by pressure half-time: 2.27 cm^2. Impressions: - Normal Overall LVF   Normal Wall Motion   EF=65%   Normal Right side. Normal study.  B/L Carotid U/S:   IMPRESSION: Mild amount of calcified plaque at the level of the right carotid bulb and proximal right ICA. Minimal plaque at the level of the left carotid bulb and left ICA origin. Estimated bilateral ICA stenoses are less than 50%.     ASSESSMENT: [name] is a 81 y.o. male with PMH of  recently discharged yesterday from [facility] for a pontine stroke on 05/31/2017.  Brought to [facility] by family with complaints of waxing/waning right sided weakness and significant worsening since discharge from [facility]. 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 He had been started on plavix, at [place] but not loaded.   Known acute left pontine infarct, no new stroke noted on CTA  New acute STROKE ruled out:  Suspected Etiology: likely recrudescence from recent Pontine lacunar stroke. Resultant Symptoms: waxing/waning right sided weakness Stroke Risk Factors: diabetes mellitus, hyperlipidemia and hypertension Other Stroke  Risk Factors: Advanced age, Hx stroke  Outstanding Stroke Work-up Studies:    Workup completed   06/04/2017: Neuro exam remains stable.  Long discussion with family at bedside regarding all imaging and lab results.  Plan of care reviewed and family agrees.  Continue aspirin Plavix and statin.  Patient may follow-up with the stroke clinic in 6 weeks or he may decide to have neurology follow-up in [place].  PLAN  06/04/2017: Continue Aspirin/Plavix/ statin Likely to be discharged to skilled nursing facility in a.m. Continue dysphagia 2 diet with nectar thick liquids Frequent neuro checks Telemetry monitoring PT/OT/SLP Consult PM & Rehab Ongoing aggressive stroke risk factor management Patient counseled to be compliant with his antithrombotic medications Patient counseled on Lifestyle modifications including, Diet, Exercise, and Stress Follow up with [facility] in 6 weeks  INTRACRANIAL Atherosclerosis &Stenosis: Moderate proximal basilar stenosis On DAPT, continue for 3 months and then Plavix alone  2-year history of gait abnormality, hypophonia and cognitive decline. Outpatient neurology follow-up  HYPERTENSION: Stable Long term BP goal normotensive. May slowly restart home B/P medications after 48 hours  HYPERLIPIDEMIA:    Component Value Date/Time   CHOL 101  06/01/2017 0428   TRIG 169 (H) 06/01/2017 0428   HDL 29 (L) 06/01/2017 0428   CHOLHDL 3.5 06/01/2017 0428   VLDL 34 06/01/2017 0428   LDLCALC 38 06/01/2017 0428  Home Meds: Crestor 20 mg LDL  goal < 70 Continued on Crestor 20 mg daily Continue statin at discharge  DIABETES: Lab Results  Component Value Date   HGBA1C 6.6 (H) 06/01/2017  HgbA1c goal < 7.0 Currently on: NovoLog Continue CBG monitoring and SSI to maintain glucose 140-180 mg/dl DM education   Other Active Problems: Principal Problem:   CVA (cerebral vascular accident) ([place]) Active Problems:   Essential hypertension,  benign   Type II diabetes mellitus with renal manifestations (HCC)   Hyperlipidemia LDL goal <100   Glaucoma   CKD (chronic kidney disease), stage III (HCC)   Parapharyngeal space mass   Stroke (cerebrum) (HCC)   Diastolic dysfunction   Dysphagia, post-stroke  Hospital day # 1 VTE prophylaxis: Lovenox  Diet : DIET DYS 2 Room service appropriate? Yes; Fluid consistency: Nectar Thick Diet - low sodium heart healthy   FAMILY UPDATES: family at bedside  TEAM UPDATES: Debbe Odea, MD     Prior Home Stroke Medications:  aspirin 81 mg daily  Discharge Stroke Meds:  Please discharge patient on aspirin 325 mg daily, clopidogrel 75 mg daily and Crestor 20 mg   Disposition: 01-Home or Self Care Therapy Recs:               CIR  Vs SNF Home Equipment:         PENDING Follow Up:  Follow-up Information    Crecencio Mc, MD Follow up in 2 week(s).   Specialty:  Internal Medicine Contact information: [address] [place] [address] [phone]        Leta Baptist, MD Follow up.   Specialty:  Otolaryngology Why:  for biopsy of neck mass.  Contact information: [address] [place] [address] [phone]        Garvin Fila, MD. Schedule an appointment as soon as possible for a visit in 6 week(s).   Specialties:  Neurology, Radiology Contact information: [address] [place] [address] [phone]          Crecencio Mc, MD -PCP Follow up in 1-2 weeks     Renie O[facility] Stroke Neurology Team 06/04/2017 2:21 PM I have personally examined this patient, reviewed notes, independently viewed imaging studies, participated in medical decision making and plan of care.ROS completed by me personally and pertinent positives fully documented  I have made any additions or clarifications directly to the above note. Agree with note above. Patient was admitted to [facility] with recent Pontine  infarct and initially improved but neurological symptoms worsen after he went on. He likely has fluctuation of his deficits from his lacunar infarct without a new stroke. Continue aspirin and Plavix for 3 weeks followed by Plavix alone and aggressive risk factor modification. Physical occupational speech therapy consults and rehabilitation consults. Long discussion with patient and multiple family members at the bedside and answered questions. Greater than 50% time during this 25 minute visit was spent on counseling and coordination of care about his lacunar infarct need for rehabilitation and aggressive risk factor modification and answered questions.  Antony Contras, MD Medical Director [facility] Pager: [phone] 06/04/2017 3:09 PM  Neurology to sign-off at this time. Please call with any further questions or  concerns. Thank you for this consultation.  To contact Stroke Continuity provider, please refer to [email or web address] After hours, contact General Neurology

## 2017-06-04 NOTE — Discharge Summary (Addendum)
Physician Discharge Summary  Anthony Allen VWU:[id number] DOB: July 05, 1935 DOA: 06/03/2017  PCP: Crecencio Mc, MD  Admit date: 06/03/2017 Discharge date: 06/05/2017  Admitted From: home Disposition:  SNF  Recommendations for Outpatient Follow-up:  1. F/u with ENT for parotid mass 2. F/u on JAK 2 Mutation  Discharge Condition:  stable   CODE STATUS:  Full code   Diet recommendation:   Consultations:  neuro    Discharge Diagnoses:  Principal Problem:   CVA (cerebral vascular accident) ([place]) Active Problems:   Essential hypertension, benign   Type II diabetes mellitus with renal manifestations ([place])   Hyperlipidemia LDL goal <100   Glaucoma   CKD (chronic kidney disease), stage III (HCC)   Parapharyngeal space mass   Diastolic dysfunction   Dysphagia, post-stroke    Subjective: No new complaints. Weak on right side. Speech seems slurred to patient and wife.   Brief Summary:  Anthony Allen is a 81 y.o. male with medical history significant of hypertension, hyperlipidemia, diabetes mellitus, gout, glaucoma and newly diagnosed stroke, who presents with right-sided weakness, slurred speech, difficulty swallowing. Pt was hospitalized from 12/22-12/24 due to left-sided weakness, slurred speech and difficulty swallowing. He was diagnosed as left pontine stroke. Dual antiplatelet therapy with aspirin and Plavix were started per neurologist recommendation. He was discharged home on 12/25 and returns for ongoing weakness which is possibly worse than before.   Hospital Course:  Left pontine infarct - right sided weakness and dysphagia - per wife he had waxing and waning weakness in the hospital and on the day of discharge was able to ambulate with the therapist but once he got home he had trouble with it and was therefore brought back the next day - repeat imaging with CTA head and neck is negative for a new infarct - SLP recommends recommends d2 diet with nectar thick  liquids- he is doing well with this - neuro recommends ASA and Plavix together x 3 wks and then Plavix alone  HTN - cont home meds  DM2 - A1c 6.6 - cont Jardiance   HLD - cont Crestor  MRI showing mass at [facility] - 4 x 1 cross-section deep lobe LEFT parotid mass with extension to the parapharyngeal space. 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  - have discussed with Dr Benjamine Mola who recommends outpt f/u with him  Elevated Hb - chronic issue- Hb ranges from 16-18 - JAK2 mutation study ordered by admitting physician  CKD 3 - stable   Discharge Instructions  Discharge Instructions    Ambulatory referral to Neurology   Complete by:  As directed    An appointment is requested in approximately: 6 weeks Follow up with stroke clinic (Dr Leonie Man preferred, if not available, then consider Caesar Chestnut, [facility] or Jaynee Eagles whoever is available) at [facility] in about 6-8 weeks. Thanks.   Diet - low sodium heart healthy   Complete by:  As directed    Increase activity slowly   Complete by:  As directed      Allergies as of 06/05/2017   No Known Allergies     Medication List    STOP taking these medications   BC HEADACHE POWDER PO     TAKE these medications   aspirin EC 81 MG tablet Take 81 mg by mouth daily.   clopidogrel 75 MG tablet Commonly known as:  PLAVIX Take 1 tablet (75 mg total) by mouth daily.   empagliflozin 10 MG Tabs tablet Commonly known as:  JARDIANCE Take 10 mg by  mouth daily.   fluticasone 50 MCG/ACT nasal spray Commonly known as:  FLONASE Place 2 sprays into both nostrils daily as needed for allergies or rhinitis.   glucose blood test strip Use as instructed   HYDROCORTISONE (TOPICAL) 2 % Lotn Apply 1 application topically as needed (itching).   latanoprost 0.005 % ophthalmic solution Commonly known as:  XALATAN Instill 1 drop into both eyes in the evening   losartan 50 MG tablet Commonly known as:  COZAAR Take 1 tablet (50 mg total) by mouth daily.    multivitamin-lutein Caps capsule Take 1 capsule by mouth daily.   onetouch ultrasoft lancets Use as instructed   rosuvastatin 20 MG tablet Commonly known as:  CRESTOR Take 1 tablet (20 mg total) by mouth daily. What changed:  Another medication with the same name was removed. Continue taking this medication, and follow the directions you see here.   timolol 0.5 % ophthalmic solution Commonly known as:  TIMOPTIC Instill 1 drop into the right eye in the morning   Vitamin D-3 1000 units Caps Take 1,000 Units by mouth daily.      Follow-up Information    Crecencio Mc, MD Follow up in 2 week(s).   Specialty:  Internal Medicine Contact information: [place] [place] [address] [phone]        Leta Baptist, MD Follow up.   Specialty:  Otolaryngology Why:  for biopsy of neck mass.  Contact information: [address] [place] [address] [phone]        Garvin Fila, MD. Schedule an appointment as soon as possible for a visit in 6 week(s).   Specialties:  Neurology, Radiology Contact information: [address] [place] [address] [phone]          No Known Allergies   Procedures/Studies:    Ct Angio Head W Or Wo Contrast  Result Date: 06/03/2017 CLINICAL DATA:  Recent stroke. Continued right-sided weakness slurred speech. EXAM: CT ANGIOGRAPHY HEAD AND NECK TECHNIQUE: Multidetector CT imaging of the head and neck was performed using the standard protocol during bolus administration of intravenous contrast. Multiplanar CT image reconstructions and MIPs were obtained to evaluate the vascular anatomy. Carotid stenosis measurements (when applicable) are obtained utilizing NASCET criteria, using the distal internal carotid diameter as the denominator. CONTRAST:  3mL ISOVUE-370 IOPAMIDOL (ISOVUE-370) INJECTION 76% COMPARISON:  Head CT and brain MRI from 2 days ago FINDINGS: CTA NECK FINDINGS Aortic arch:  Atherosclerosis. Three vessel branching. No acute finding. Right carotid system: Moderate primarily calcified plaque at the ICA bulb without flow limiting stenosis or ulceration. Left carotid system: Moderate calcified and noncalcified plaque of the proximal ICA without flow limiting stenosis or ulceration. Vertebral arteries: Prominent irregular and ulcerated plaque in the proximal left subclavian artery. 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 The dominant right vertebral artery shows no flow limiting stenosis or ulceration. Skeleton: Excavation of the left mandible body contiguous with mucosa at the alveolar ridge. Other neck: There is a known left deep parotid and parapharyngeal mass has better seen by prior MRI and enhanced neck CT, approximately 4 x 1 cm. Subgaleal lipoma of the forehead. Upper chest: No acute finding Review of the MIP images confirms the above findings CTA HEAD FINDINGS Anterior circulation: Atherosclerotic plaque on the carotid siphons. No major branch occlusion. Chronic azygos appearance of the left A2 and aplastic left A1  segment. A small truncated right A2 segment is visible by CTA. Negative for aneurysm. Posterior circulation: Left vertebral occlusion of the dura. Atherosclerosis of the right V4 segment. Moderate narrowing of the proximal basilar. Fetal type left PCA. Atherosclerotic irregularity of bilateral PCA with bilateral distal P2 with severe stenosis, with flow gap on the left. Right PICA stenosis that was better seen on previous MRA. Venous sinuses: Patent Anatomic variants: As above Delayed phase: No abnormal intracranial enhancement. A left pontine acute infarct by MRI is subtle by CT. Remote right basal ganglia infarct. Review of the MIP images confirms the above findings IMPRESSION: 1. No acute intracranial finding when compared to brain MRI and MRA 2 days prior. 2. A known acute left pontine  infarct is subtle by CT. 3. 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 Notable irregular plaque at the proximal left subclavian. 4. 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 5. Known left parapharyngeal and deep parotid mass/neoplasm. ENT referral has been recommended previously. 6. Alveolar ridge and body erosion of the left mandible, recommend mucosal exam to exclude underlying destructive lesion. Electronically Signed   By: Monte Fantasia M.D.   On: 06/03/2017 21:48   Ct Head Wo Contrast  Result Date: 06/03/2017 CLINICAL DATA:  Right-sided weakness, slurred speech. EXAM: CT HEAD WITHOUT CONTRAST TECHNIQUE: Contiguous axial images were obtained from the base of the skull through the vertex without intravenous contrast. COMPARISON:  CT scan of May 31, 2017. MRI of June 01, 2017. FINDINGS: Brain: Mild chronic ischemic white matter disease is noted. Faint low density is noted in left pons which may correspond to infarction identified on prior MRI. No mass effect or midline shift is noted. Ventricular size is within normal limits. No hemorrhage or mass lesion is noted. Vascular: No hyperdense vessel or unexpected calcification. Skull: Normal. Negative for fracture or focal lesion. Sinuses/Orbits: No acute finding. Other: None. IMPRESSION: Mild chronic ischemic white matter disease. Possible low density seen in left pons which may correspond to infarction seen on prior MRI. No other abnormality is seen. Electronically Signed   By: Marijo Conception, M.D.   On: 06/03/2017 20:52   Ct Head Wo Contrast  Result Date: 05/31/2017 CLINICAL DATA:  Slurred speech.  Weakness. EXAM: CT HEAD WITHOUT CONTRAST TECHNIQUE: Contiguous axial images were obtained from the base of the skull through the vertex without intravenous contrast. COMPARISON:  None. FINDINGS: Brain: Mild chronic ischemic white matter disease is noted. No mass effect or midline shift is noted. Ventricular size is within normal  limits. There is no evidence of mass lesion, hemorrhage or acute infarction. Vascular: No hyperdense vessel or unexpected calcification. Skull: Normal. Negative for fracture or focal lesion. Sinuses/Orbits: Mild left maxillary sinusitis is noted. Other: Probable lipoma is seen in the frontal scalp. IMPRESSION: Mild chronic ischemic white matter disease. No acute intracranial abnormality seen. Electronically Signed   By: Marijo Conception, M.D.   On: 05/31/2017 11:24   Ct Soft Tissue Neck W Contrast  Result Date: 06/01/2017 CLINICAL DATA:  Continued surveillance of a LEFT pre styloid parapharyngeal mass. Difficulty swallowing. Acute pontine infarct. EXAM: CT NECK WITH CONTRAST TECHNIQUE: Multidetector CT imaging of the neck was performed using the standard protocol following the bolus administration of intravenous contrast. CONTRAST:  33mL ISOVUE-300 IOPAMIDOL (ISOVUE-300) INJECTION 61% COMPARISON:  MR brain 06/01/2017. FINDINGS: Pharynx and larynx: No intrinsic pharyngeal or laryngeal pathology. Salivary glands: Normal RIGHT parotid gland and submandibular glands. Inseparable from the LEFT lobe of the parotid is a  tubular 4 x 1 pre styloid mass extending from the parotid space into the parapharyngeal space. 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 ENT consultation is warranted (on elective basis). Thyroid: None. Lymph nodes: None enlarged or abnormal density. Vascular: No vascular occlusion. Carotid bifurcation atherosclerosis. Calcification of the cavernous internal carotid arteries consistent with cerebrovascular atherosclerotic disease. Limited intracranial: Negative. The pontine infarct is not visible on CT. Visualized orbits: No orbital masses. Mastoids and visualized paranasal sinuses: Mastoids are clear. Layering fluid LEFT maxillary sinus. Skeleton: Cervical spondylosis. Multiple missing teeth, including a large area of osseous destruction in the body of the LEFT mandible, 1 x 2 cm cross-section lateral  cortex disruption, suspect focal osteomyelitis of the LEFT mandible related to periodontal disease. Upper chest: No nodule or pneumothorax. Other: None. IMPRESSION: 4 x 1 cross-section deep lobe LEFT parotid mass with extension to the parapharyngeal space. 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 Elective ENT consultation is warranted. No airway displacement or obstruction. Lytic area of destruction in the LEFT mandibular body, approximate 1 x 2 cm, suspect focal osteomyelitis related to periodontal disease. Oral and maxillofacial consultation is warranted. Electronically Signed   By: Staci Righter M.D.   On: 06/01/2017 14:58   Ct Angio Neck W Or Wo Contrast  Result Date: 06/03/2017 CLINICAL DATA:  Recent stroke. Continued right-sided weakness slurred speech. EXAM: CT ANGIOGRAPHY HEAD AND NECK TECHNIQUE: Multidetector CT imaging of the head and neck was performed using the standard protocol during bolus administration of intravenous contrast. Multiplanar CT image reconstructions and MIPs were obtained to evaluate the vascular anatomy. Carotid stenosis measurements (when applicable) are obtained utilizing NASCET criteria, using the distal internal carotid diameter as the denominator. CONTRAST:  41mL ISOVUE-370 IOPAMIDOL (ISOVUE-370) INJECTION 76% COMPARISON:  Head CT and brain MRI from 2 days ago FINDINGS: CTA NECK FINDINGS Aortic arch: Atherosclerosis. Three vessel branching. No acute finding. Right carotid system: Moderate primarily calcified plaque at the ICA bulb without flow limiting stenosis or ulceration. Left carotid system: Moderate calcified and noncalcified plaque of the proximal ICA without flow limiting stenosis or ulceration. Vertebral arteries: Prominent irregular and ulcerated plaque in the proximal left subclavian artery. 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 The dominant right vertebral artery shows no flow limiting  stenosis or ulceration. Skeleton: Excavation of the left mandible body contiguous with mucosa at the alveolar ridge. Other neck: There is a known left deep parotid and parapharyngeal mass has better seen by prior MRI and enhanced neck CT, approximately 4 x 1 cm. Subgaleal lipoma of the forehead. Upper chest: No acute finding Review of the MIP images confirms the above findings CTA HEAD FINDINGS Anterior circulation: Atherosclerotic plaque on the carotid siphons. No major branch occlusion. Chronic azygos appearance of the left A2 and aplastic left A1 segment. A small truncated right A2 segment is visible by CTA. Negative for aneurysm. Posterior circulation: Left vertebral occlusion of the dura. Atherosclerosis of the right V4 segment. Moderate narrowing of the proximal basilar. Fetal type left PCA. Atherosclerotic irregularity of bilateral PCA with bilateral distal P2 with severe stenosis, with flow gap on the left. Right PICA stenosis that was better seen on previous MRA. Venous sinuses: Patent Anatomic variants: As above Delayed phase: No abnormal intracranial enhancement. A left pontine acute infarct by MRI is subtle by CT. Remote right basal ganglia infarct. Review of the MIP images confirms the above findings IMPRESSION: 1. No acute intracranial finding when compared to  brain MRI and MRA 2 days prior. 2. A known acute left pontine infarct is subtle by CT. 3. 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 Notable irregular plaque at the proximal left subclavian. 4. 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 5. Known left parapharyngeal and deep parotid mass/neoplasm. ENT referral has been recommended previously. 6. Alveolar ridge and body erosion of the left mandible, recommend mucosal exam to exclude underlying destructive lesion. Electronically Signed   By: Monte Fantasia M.D.   On: 06/03/2017 21:48   Mr Brain Wo Contrast  Result Date: 06/01/2017 CLINICAL DATA:  TIA. Intermittent weakness  and slurred speech with loss of balance. EXAM: MRI HEAD WITHOUT CONTRAST MRA HEAD WITHOUT CONTRAST TECHNIQUE: Multiplanar, multiecho pulse sequences of the brain and surrounding structures were obtained without intravenous contrast. Angiographic images of the head were obtained using MRA technique without contrast. COMPARISON:  Head CT from yesterday FINDINGS: MRI HEAD FINDINGS Brain: Confluent restricted diffusion in the left pons. Remote right corona radiata infarct with atrophic right midbrain and pons attributed to wallerian changes. Chronic small vessel ischemic gliosis in the cerebral white matter. No hemorrhage, hydrocephalus, or masslike finding. Vascular: Abnormal left vertebral flow void, see below. Skull and upper cervical spine: Negative for marrow lesion. Subgaleal lipoma in the central forehead measuring up to 2.7 cm. Sinuses/Orbits: No acute finding. Mucous retention cyst in the left maxillary sinus. Other: Pre styloid mass in the left parapharyngeal fat extending to the deep lobe of the parotid. The homogeneous mass measures 4 x 1.2 cm where covered. MRA HEAD FINDINGS The non dominant left vertebral artery is occluded. Moderate proximal basilar narrowing. Fetal type left PCA. Advanced stenosis of the bilateral P2 segments with asymmetric poor flow on the left. high-grade right proximal pica stenosis. Symmetric carotid arteries. Aplastic left A1 segment. Azygos A2 segment. No branch occlusion. Negative for aneurysm. These results were called by telephone at the time of interpretation on 06/01/2017 at 12:59 pm to Dr. Verdell Carmine, who verbally acknowledged these results. IMPRESSION: Brain MRI: 1. Acute left pontine infarct. 2. Remote right corona radiata infarct . 3. 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 Recommend ENT referral if clinically appropriate. Intracranial MRA 1. Left vertebral occlusion that is age indeterminate. 2. Advanced intracranial  atherosclerosis. There is high-grade right PICA and bilateral P2 segment stenoses. Moderate mid basilar stenosis. Electronically Signed   By: Monte Fantasia M.D.   On: 06/01/2017 13:01   US Carotid Bilateral (at [facility] And [facility] Only)  Result Date: 06/01/2017 CLINICAL DATA:  TIA, hypertension and diabetes. EXAM: BILATERAL CAROTID DUPLEX ULTRASOUND TECHNIQUE: Pearline Cables scale imaging, color Doppler and duplex ultrasound were performed of bilateral carotid and vertebral arteries in the neck. COMPARISON:  None. FINDINGS: Criteria: Quantification of carotid stenosis is based on velocity parameters that correlate the residual internal carotid diameter with NASCET-based stenosis levels, using the diameter of the distal internal carotid lumen as the denominator for stenosis measurement. The following velocity measurements were obtained: RIGHT ICA:  71/20 cm/sec CCA:  97/98 cm/sec SYSTOLIC ICA/CCA RATIO:  0.8 DIASTOLIC ICA/CCA RATIO:  1.8 ECA:  143 cm/sec LEFT ICA:  78/9 cm/sec CCA:  92/1 cm/sec SYSTOLIC ICA/CCA RATIO:  0.9 DIASTOLIC ICA/CCA RATIO:  3 ECA:  136 cm/sec RIGHT CAROTID ARTERY: There is a mild amount of calcified plaque at the level of the carotid bulb and proximal right ICA. Estimated right ICA stenosis is less than 50%. RIGHT VERTEBRAL ARTERY: Antegrade flow with normal waveform and velocity.  LEFT CAROTID ARTERY: Minimal amount of partially calcified plaque is present at the level of the carotid bulb. This approaches the ICA origin. Estimated left ICA stenosis is less than 50%. LEFT VERTEBRAL ARTERY: Antegrade flow with normal waveform and velocity. IMPRESSION: Mild amount of calcified plaque at the level of the right carotid bulb and proximal right ICA. Minimal plaque at the level of the left carotid bulb and left ICA origin. Estimated bilateral ICA stenoses are less than 50%. Electronically Signed   By: Aletta Edouard M.D.   On: 06/01/2017 09:05   Mr Jodene Nam Head/brain ZO Cm  Result Date: 06/01/2017 CLINICAL  DATA:  TIA. Intermittent weakness and slurred speech with loss of balance. EXAM: MRI HEAD WITHOUT CONTRAST MRA HEAD WITHOUT CONTRAST TECHNIQUE: Multiplanar, multiecho pulse sequences of the brain and surrounding structures were obtained without intravenous contrast. Angiographic images of the head were obtained using MRA technique without contrast. COMPARISON:  Head CT from yesterday FINDINGS: MRI HEAD FINDINGS Brain: Confluent restricted diffusion in the left pons. Remote right corona radiata infarct with atrophic right midbrain and pons attributed to wallerian changes. Chronic small vessel ischemic gliosis in the cerebral white matter. No hemorrhage, hydrocephalus, or masslike finding. Vascular: Abnormal left vertebral flow void, see below. Skull and upper cervical spine: Negative for marrow lesion. Subgaleal lipoma in the central forehead measuring up to 2.7 cm. Sinuses/Orbits: No acute finding. Mucous retention cyst in the left maxillary sinus. Other: Pre styloid mass in the left parapharyngeal fat extending to the deep lobe of the parotid. The homogeneous mass measures 4 x 1.2 cm where covered. MRA HEAD FINDINGS The non dominant left vertebral artery is occluded. Moderate proximal basilar narrowing. Fetal type left PCA. Advanced stenosis of the bilateral P2 segments with asymmetric poor flow on the left. high-grade right proximal pica stenosis. Symmetric carotid arteries. Aplastic left A1 segment. Azygos A2 segment. No branch occlusion. Negative for aneurysm. These results were called by telephone at the time of interpretation on 06/01/2017 at 12:59 pm to Dr. Verdell Carmine, who verbally acknowledged these results. IMPRESSION: Brain MRI: 1. Acute left pontine infarct. 2. Remote right corona radiata infarct . 3. 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 Recommend ENT referral if clinically appropriate. Intracranial MRA 1. Left vertebral occlusion that is age indeterminate. 2.  Advanced intracranial atherosclerosis. There is high-grade right PICA and bilateral P2 segment stenoses. Moderate mid basilar stenosis. Electronically Signed   By: Monte Fantasia M.D.   On: 06/01/2017 13:01       Discharge Exam: Vitals:   06/05/17 0500 06/05/17 0920  BP: (!) 147/68 135/78  Pulse: 66 85  Resp: 18 13  Temp: 97.7 F (36.5 C) 97.9 F (36.6 C)  SpO2: 94% 95%   Vitals:   06/04/17 2100 06/05/17 0100 06/05/17 0500 06/05/17 0920  BP: 130/61 119/61 (!) 147/68 135/78  Pulse: 74 64 66 85  Resp: 20 18 18 13   Temp: 98 F (36.7 C) 99 F (37.2 C) 97.7 F (36.5 C) 97.9 F (36.6 C)  TempSrc: Oral Axillary Oral Oral  SpO2: 93% 94% 94% 95%  Weight:      Height:        General: Pt is alert, awake, not in acute distress Cardiovascular: RRR, S1/S2 +, no rubs, no gallops Respiratory: CTA bilaterally, no wheezing, no rhonchi Abdominal: Soft, NT, ND, bowel sounds + Extremities: no edema, no cyanosis Neuro: right sided weakness with mild slurred speech    The results of significant  diagnostics from this hospitalization (including imaging, microbiology, ancillary and laboratory) are listed below for reference.     Microbiology: No results found for this or any previous visit (from the past 240 hour(s)).   Labs: BNP (last 3 results) No results for input(s): BNP in the last 8760 hours. Basic Metabolic Panel: Recent Labs  Lab 05/31/17 1044 06/03/17 1923 06/03/17 1939  NA 138 138 141  K 3.9 4.2 4.2  CL 108 103 104  CO2 23 24  --   GLUCOSE 202* 167* 164*  BUN 17 31* 33*  CREATININE 1.57* 1.64* 1.60*  CALCIUM 9.2 10.1  --    Liver Function Tests: Recent Labs  Lab 05/31/17 1044 06/03/17 1923  AST 31 35  ALT 41 37  ALKPHOS 84 79  BILITOT 1.1 1.4*  PROT 6.7 7.1  ALBUMIN 3.7 4.0   No results for input(s): LIPASE, AMYLASE in the last 168 hours. No results for input(s): AMMONIA in the last 168 hours. CBC: Recent Labs  Lab 05/31/17 1044 06/03/17 1923  06/03/17 1939  WBC 8.3 8.9  --   NEUTROABS 6.6* 6.4  --   HGB 16.3 18.8* 18.0*  HCT 47.8 52.0 53.0*  MCV 94.7 93.0  --   PLT 155 197  --    Cardiac Enzymes: Recent Labs  Lab 05/31/17 1044  TROPONINI <0.03   BNP: Invalid input(s): POCBNP CBG: Recent Labs  Lab 06/04/17 1058 06/04/17 1736 06/04/17 2134 06/05/17 0609 06/05/17 1152  GLUCAP 149* 237* 166* 134* 199*   D-Dimer No results for input(s): DDIMER in the last 72 hours. Hgb A1c No results for input(s): HGBA1C in the last 72 hours. Lipid Profile No results for input(s): CHOL, HDL, LDLCALC, TRIG, CHOLHDL, LDLDIRECT in the last 72 hours. Thyroid function studies No results for input(s): TSH, T4TOTAL, T3FREE, THYROIDAB in the last 72 hours.  Invalid input(s): FREET3 Anemia work up No results for input(s): VITAMINB12, FOLATE, FERRITIN, TIBC, IRON, RETICCTPCT in the last 72 hours. Urinalysis    Component Value Date/Time   COLORURINE STRAW (A) 05/31/2017 1803   APPEARANCEUR CLEAR (A) 05/31/2017 1803   LABSPEC 1.013 05/31/2017 1803   PHURINE 5.0 05/31/2017 1803   GLUCOSEU >=500 (A) 05/31/2017 1803   HGBUR NEGATIVE 05/31/2017 1803   BILIRUBINUR NEGATIVE 05/31/2017 1803   KETONESUR 5 (A) 05/31/2017 1803   PROTEINUR NEGATIVE 05/31/2017 1803   NITRITE NEGATIVE 05/31/2017 1803   LEUKOCYTESUR NEGATIVE 05/31/2017 1803   Sepsis Labs Invalid input(s): PROCALCITONIN,  WBC,  LACTICIDVEN Microbiology No results found for this or any previous visit (from the past 240 hour(s)).   Time coordinating discharge: Over 30 minutes  SIGNED:   Debbe Odea, MD  [facility] 06/05/2017, 1:33 PM Pager   If 7PM-7AM, please contact night-coverage [email or web address] Password [email or web address]

## 2017-06-04 NOTE — Consult Note (Addendum)
Physical Medicine and Rehabilitation Consult Reason for Consult: Decreased functional mobility Referring Physician: [facility]   HPI: Anthony Allen is a 81 y.o. right handed male with history of hypertension, type 2 diabetes mellitus. Per chart review and wife, patient lives with spouse. Independent ambulator without assistive device prior to admission. Independent with ADLs and still driving. 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 Initially presented Presented 05/31/2017 to [facility] with aphasia. Cranial CT reviewed, unremarkable for acute process.  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 Placed on aspirin and Plavix therapy. Discharge to home 06/02/2017 ambulating 125 feet minimal assistance. Presented 06/03/2017 with increasing right sided weakness and aphasia. Follow-up CT the head showed mild chronic ischemic white matter disease. Possible low density seen in the left pons corresponding to infarctions seen on prior MRI. CT angiogram head and neck shows no acute intracranial findings compared to brain MRI and MRA. Moderate proximal basilar stenosis. Recent echocardiogram with ejection fraction of 02% grade 1 diastolic dysfunction. Neurology follow-up aspirin increased to 325 mg daily and remains on Plavix for CVA prophylaxis. Subcutaneous Lovenox for DVT prophylaxis. Currently NPO with plan for formal swallow study. Physical and occupational therapy evaluations are pending. M.D. has requested physical medicine rehabilitation consult.  Review of Systems  Constitutional: Negative for fever.  HENT: Negative for hearing loss.   Eyes: Negative for blurred vision and double vision.  Respiratory: Negative for shortness of breath.   Cardiovascular: Negative for chest pain, palpitations and leg swelling.  Gastrointestinal: Positive for constipation. Negative for nausea.  Genitourinary: Negative for dysuria, flank pain and hematuria.  Musculoskeletal: Positive  for joint pain and myalgias.  Skin: Negative for rash.  Neurological: Positive for speech change and focal weakness. Negative for seizures.  All other systems reviewed and are negative.  Past Medical History:  Diagnosis Date  . Glaucoma   . Gout   . Hypertension   . Type 2 diabetes mellitus with hyperglycemia [facility])    Past Surgical History:  Procedure Laterality Date  . TONSILLECTOMY     Family History  Problem Relation Age of Onset  . Arthritis Mother   . Stroke Father   . Hypertension Father   . Heart disease Father 15       AMI  . Kidney disease Father        bladder ca congenital loss of kidney  . Arthritis Maternal Grandmother   . Early death Daughter 16       aspiration  . Cancer Brother        Scalp   Social History:  reports that  has never smoked. he has never used smokeless tobacco. He reports that he drinks alcohol. He reports that he does not use drugs. Allergies: No Known Allergies Medications Prior to Admission  Medication Sig Dispense Refill  . aspirin EC 81 MG tablet Take 81 mg by mouth daily.    . Aspirin-Salicylamide-Caffeine (BC HEADACHE POWDER PO) Take 1 packet by mouth daily as needed (for headaches or pain).     . Cholecalciferol (VITAMIN D-3) 1000 units CAPS Take 1,000 Units by mouth daily.    . empagliflozin (JARDIANCE) 10 MG TABS tablet Take 10 mg by mouth daily. 30 tablet 3  . fluticasone (FLONASE) 50 MCG/ACT nasal spray Place 2 sprays into both nostrils daily as needed for allergies or rhinitis.     Marland Kitchen HYDROCORTISONE, TOPICAL, 2 % LOTN Apply 1 application topically as needed (itching).    Marland Kitchen  latanoprost (XALATAN) 0.005 % ophthalmic solution Instill 1 drop into both eyes in the evening  0  . losartan (COZAAR) 50 MG tablet Take 1 tablet (50 mg total) by mouth daily. 90 tablet 3  . multivitamin-lutein (OCUVITE-LUTEIN) CAPS capsule Take 1 capsule by mouth daily.    . rosuvastatin (CRESTOR) 5 MG tablet Take 5 mg by mouth daily.  0  . timolol (TIMOPTIC)  0.5 % ophthalmic solution Instill 1 drop into the right eye in the morning  0  . clopidogrel (PLAVIX) 75 MG tablet Take 1 tablet (75 mg total) by mouth daily. 30 tablet 0  . glucose blood test strip Use as instructed 100 each 5  . Lancets (ONETOUCH ULTRASOFT) lancets Use as instructed 100 each 5  . rosuvastatin (CRESTOR) 20 MG tablet Take 1 tablet (20 mg total) by mouth daily. 30 tablet 1    Home:    Functional History:   Functional Status:  Mobility:          ADL:    Cognition: Cognition Orientation Level: Oriented X4    Blood pressure (!) 147/68, pulse 74, temperature 97.8 F (36.6 C), temperature source Oral, resp. rate 20, height 5\' 9"  (1.753 m), weight 72.7 kg (160 lb 4.4 oz), SpO2 94 %. Physical Exam  Vitals reviewed. Constitutional: He appears well-developed and well-nourished.  HENT:  Head: Normocephalic and atraumatic.  Eyes: EOM are normal. Right eye exhibits no discharge. Left eye exhibits no discharge.  Neck: Normal range of motion. Neck supple. No thyromegaly present.  Cardiovascular: Normal rate and regular rhythm.  Respiratory: Effort normal and breath sounds normal. No respiratory distress.  GI: Soft. Bowel sounds are normal. He exhibits no distension.  Musculoskeletal:  No edema or tenderness in extremities  Neurological: He is alert.  Speech is dysarthric but intelligible.  He follows full commands in good awareness of his deficits. Sensation intact to light touch Motor: RUE/RLE: 2-/5 proximal to distal LUE/LLE: 5/5 proximal to distal A&OX3  Skin: Skin is warm and dry.  Psychiatric: His affect is blunt.    Results for orders placed or performed during the hospital encounter of 06/03/17 (from the past 24 hour(s))  Protime-INR     Status: None   Collection Time: 06/03/17  7:23 PM  Result Value Ref Range   Prothrombin Time 13.6 11.4 - 15.2 seconds   INR 1.05   APTT     Status: None   Collection Time: 06/03/17  7:23 PM  Result Value Ref Range     aPTT 31 24 - 36 seconds  CBC     Status: Abnormal   Collection Time: 06/03/17  7:23 PM  Result Value Ref Range   WBC 8.9 4.0 - 10.5 K/uL   RBC 5.59 4.22 - 5.81 MIL/uL   Hemoglobin 18.8 (H) 13.0 - 17.0 g/dL   HCT 52.0 39.0 - 52.0 %   MCV 93.0 78.0 - 100.0 fL   MCH 33.6 26.0 - 34.0 pg   MCHC 36.2 (H) 30.0 - 36.0 g/dL   RDW 13.8 11.5 - 15.5 %   Platelets 197 150 - 400 K/uL  Differential     Status: None   Collection Time: 06/03/17  7:23 PM  Result Value Ref Range   Neutrophils Relative % 71 %   Neutro Abs 6.4 1.7 - 7.7 K/uL   Lymphocytes Relative 18 %   Lymphs Abs 1.6 0.7 - 4.0 K/uL   Monocytes Relative 7 %   Monocytes Absolute 0.6 0.1 - 1.0 K/uL  Eosinophils Relative 3 %   Eosinophils Absolute 0.2 0.0 - 0.7 K/uL   Basophils Relative 1 %   Basophils Absolute 0.1 0.0 - 0.1 K/uL  Comprehensive metabolic panel     Status: Abnormal   Collection Time: 06/03/17  7:23 PM  Result Value Ref Range   Sodium 138 135 - 145 mmol/L   Potassium 4.2 3.5 - 5.1 mmol/L   Chloride 103 101 - 111 mmol/L   CO2 24 22 - 32 mmol/L   Glucose, Bld 167 (H) 65 - 99 mg/dL   BUN 31 (H) 6 - 20 mg/dL   Creatinine, Ser 1.64 (H) 0.61 - 1.24 mg/dL   Calcium 10.1 8.9 - 10.3 mg/dL   Total Protein 7.1 6.5 - 8.1 g/dL   Albumin 4.0 3.5 - 5.0 g/dL   AST 35 15 - 41 U/L   ALT 37 17 - 63 U/L   Alkaline Phosphatase 79 38 - 126 U/L   Total Bilirubin 1.4 (H) 0.3 - 1.2 mg/dL   GFR calc non Af Amer 38 (L) >60 mL/min   GFR calc Af Amer 44 (L) >60 mL/min   Anion gap 11 5 - 15  I-stat troponin, ED     Status: None   Collection Time: 06/03/17  7:37 PM  Result Value Ref Range   Troponin i, poc 0.00 0.00 - 0.08 ng/mL   Comment 3          I-Stat Chem 8, ED     Status: Abnormal   Collection Time: 06/03/17  7:39 PM  Result Value Ref Range   Sodium 141 135 - 145 mmol/L   Potassium 4.2 3.5 - 5.1 mmol/L   Chloride 104 101 - 111 mmol/L   BUN 33 (H) 6 - 20 mg/dL   Creatinine, Ser 1.60 (H) 0.61 - 1.24 mg/dL   Glucose, Bld  164 (H) 65 - 99 mg/dL   Calcium, Ion 1.22 1.15 - 1.40 mmol/L   TCO2 25 22 - 32 mmol/L   Hemoglobin 18.0 (H) 13.0 - 17.0 g/dL   HCT 53.0 (H) 39.0 - 52.0 %  Glucose, capillary     Status: Abnormal   Collection Time: 06/03/17 11:36 PM  Result Value Ref Range   Glucose-Capillary 134 (H) 65 - 99 mg/dL   Comment 1 Notify RN    Comment 2 Document in Chart   Glucose, capillary     Status: Abnormal   Collection Time: 06/04/17  6:41 AM  Result Value Ref Range   Glucose-Capillary 161 (H) 65 - 99 mg/dL   Comment 1 Notify RN    Comment 2 Document in Chart    Ct Angio Head W Or Wo Contrast  Result Date: 06/03/2017 CLINICAL DATA:  Recent stroke. Continued right-sided weakness slurred speech. EXAM: CT ANGIOGRAPHY HEAD AND NECK TECHNIQUE: Multidetector CT imaging of the head and neck was performed using the standard protocol during bolus administration of intravenous contrast. Multiplanar CT image reconstructions and MIPs were obtained to evaluate the vascular anatomy. Carotid stenosis measurements (when applicable) are obtained utilizing NASCET criteria, using the distal internal carotid diameter as the denominator. CONTRAST:  77mL ISOVUE-370 IOPAMIDOL (ISOVUE-370) INJECTION 76% COMPARISON:  Head CT and brain MRI from 2 days ago FINDINGS: CTA NECK FINDINGS Aortic arch: Atherosclerosis. Three vessel branching. No acute finding. Right carotid system: Moderate primarily calcified plaque at the ICA bulb without flow limiting stenosis or ulceration. Left carotid system: Moderate calcified and noncalcified plaque of the proximal ICA without flow limiting stenosis or ulceration. Vertebral  arteries: Prominent irregular and ulcerated plaque in the proximal left subclavian artery. 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 The dominant right vertebral artery shows no flow limiting stenosis or ulceration. Skeleton: Excavation of the left mandible  body contiguous with mucosa at the alveolar ridge. Other neck: There is a known left deep parotid and parapharyngeal mass has better seen by prior MRI and enhanced neck CT, approximately 4 x 1 cm. Subgaleal lipoma of the forehead. Upper chest: No acute finding Review of the MIP images confirms the above findings CTA HEAD FINDINGS Anterior circulation: Atherosclerotic plaque on the carotid siphons. No major branch occlusion. Chronic azygos appearance of the left A2 and aplastic left A1 segment. A small truncated right A2 segment is visible by CTA. Negative for aneurysm. Posterior circulation: Left vertebral occlusion of the dura. Atherosclerosis of the right V4 segment. Moderate narrowing of the proximal basilar. Fetal type left PCA. Atherosclerotic irregularity of bilateral PCA with bilateral distal P2 with severe stenosis, with flow gap on the left. Right PICA stenosis that was better seen on previous MRA. Venous sinuses: Patent Anatomic variants: As above Delayed phase: No abnormal intracranial enhancement. A left pontine acute infarct by MRI is subtle by CT. Remote right basal ganglia infarct. Review of the MIP images confirms the above findings IMPRESSION: 1. No acute intracranial finding when compared to brain MRI and MRA 2 days prior. 2. A known acute left pontine infarct is subtle by CT. 3. 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 Notable irregular plaque at the proximal left subclavian. 4. 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 5. Known left parapharyngeal and deep parotid mass/neoplasm. ENT referral has been recommended previously. 6. Alveolar ridge and body erosion of the left mandible, recommend mucosal exam to exclude underlying destructive lesion. Electronically Signed   By: Monte Fantasia M.D.   On: 06/03/2017 21:48   Ct Head Wo Contrast  Result Date: 06/03/2017 CLINICAL DATA:  Right-sided weakness, slurred speech. EXAM: CT HEAD WITHOUT CONTRAST TECHNIQUE: Contiguous axial  images were obtained from the base of the skull through the vertex without intravenous contrast. COMPARISON:  CT scan of May 31, 2017. MRI of June 01, 2017. FINDINGS: Brain: Mild chronic ischemic white matter disease is noted. Faint low density is noted in left pons which may correspond to infarction identified on prior MRI. No mass effect or midline shift is noted. Ventricular size is within normal limits. No hemorrhage or mass lesion is noted. Vascular: No hyperdense vessel or unexpected calcification. Skull: Normal. Negative for fracture or focal lesion. Sinuses/Orbits: No acute finding. Other: None. IMPRESSION: Mild chronic ischemic white matter disease. Possible low density seen in left pons which may correspond to infarction seen on prior MRI. No other abnormality is seen. Electronically Signed   By: Marijo Conception, M.D.   On: 06/03/2017 20:52   Ct Angio Neck W Or Wo Contrast  Result Date: 06/03/2017 CLINICAL DATA:  Recent stroke. Continued right-sided weakness slurred speech. EXAM: CT ANGIOGRAPHY HEAD AND NECK TECHNIQUE: Multidetector CT imaging of the head and neck was performed using the standard protocol during bolus administration of intravenous contrast. Multiplanar CT image reconstructions and MIPs were obtained to evaluate the vascular anatomy. Carotid stenosis measurements (when applicable) are obtained utilizing NASCET criteria, using the distal internal carotid diameter as the denominator. CONTRAST:  60mL ISOVUE-370 IOPAMIDOL (ISOVUE-370) INJECTION 76% COMPARISON:  Head CT and brain MRI from 2 days ago  FINDINGS: CTA NECK FINDINGS Aortic arch: Atherosclerosis. Three vessel branching. No acute finding. Right carotid system: Moderate primarily calcified plaque at the ICA bulb without flow limiting stenosis or ulceration. Left carotid system: Moderate calcified and noncalcified plaque of the proximal ICA without flow limiting stenosis or ulceration. Vertebral arteries: Prominent irregular  and ulcerated plaque in the proximal left subclavian artery. 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 The dominant right vertebral artery shows no flow limiting stenosis or ulceration. Skeleton: Excavation of the left mandible body contiguous with mucosa at the alveolar ridge. Other neck: There is a known left deep parotid and parapharyngeal mass has better seen by prior MRI and enhanced neck CT, approximately 4 x 1 cm. Subgaleal lipoma of the forehead. Upper chest: No acute finding Review of the MIP images confirms the above findings CTA HEAD FINDINGS Anterior circulation: Atherosclerotic plaque on the carotid siphons. No major branch occlusion. Chronic azygos appearance of the left A2 and aplastic left A1 segment. A small truncated right A2 segment is visible by CTA. Negative for aneurysm. Posterior circulation: Left vertebral occlusion of the dura. Atherosclerosis of the right V4 segment. Moderate narrowing of the proximal basilar. Fetal type left PCA. Atherosclerotic irregularity of bilateral PCA with bilateral distal P2 with severe stenosis, with flow gap on the left. Right PICA stenosis that was better seen on previous MRA. Venous sinuses: Patent Anatomic variants: As above Delayed phase: No abnormal intracranial enhancement. A left pontine acute infarct by MRI is subtle by CT. Remote right basal ganglia infarct. Review of the MIP images confirms the above findings IMPRESSION: 1. No acute intracranial finding when compared to brain MRI and MRA 2 days prior. 2. A known acute left pontine infarct is subtle by CT. 3. 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 Notable irregular plaque at the proximal left subclavian. 4. 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 5. Known left parapharyngeal and deep parotid mass/neoplasm. ENT referral has been recommended previously. 6. Alveolar ridge and body erosion of the left  mandible, recommend mucosal exam to exclude underlying destructive lesion. Electronically Signed   By: Monte Fantasia M.D.   On: 06/03/2017 21:48    Assessment/Plan: Diagnosis: Left-sided pontine infarct Labs and images independently reviewed.  Records reviewed and summated above. Stroke: Continue secondary stroke prophylaxis and Risk Factor Modification listed below:   Antiplatelet therapy:   Blood Pressure Management:  Continue current medication with prn's with permisive HTN per primary team Statin Agent:   Diabetes management:   Right sided hemiparesis: fit for orthosis to prevent contractures (resting hand splint for day, wrist cock up splint at night, PRAFO, etc) Motor recovery: Fluoxetine  1. Does the need for close, 24 hr/day medical supervision in concert with the patient's rehab needs make it unreasonable for this patient to be served in a less intensive setting? No  2. Co-Morbidities requiring supervision/potential complications: HTN (monitor and provide prns in accordance with increased physical exertion and pain), type 2 DM (Monitor in accordance with exercise and adjust meds as necessary), diastolic dysfunction (monitor for signs/symptoms of fluid overload), dysphagia (advance diet as tolerated), CKD (avoid nephrotoxic meds) 3. Due to safety and patient education, does the patient require 24 hr/day rehab nursing? No 4. Does the patient require coordinated care of a physician, rehab nurse, PT (1-2 hrs/day, 5 days/week), OT (1-2 hrs/day, 5 days/week) and SLP (1-2 hrs/day, 5 days/week) to address physical and functional deficits in  the context of the above medical diagnosis(es)? No Addressing deficits in the following areas: balance, endurance, locomotion, strength, bathing, dressing, toileting, speech, swallowing and psychosocial support 5. Can the patient actively participate in an intensive therapy program of at least 3 hrs of therapy per day at least 5 days per week? Yes 6. The  potential for patient to make measurable gains while on inpatient rehab is fair 7. Anticipated functional outcomes upon discharge from inpatient rehab are n/a  with PT, n/a with OT, n/a with SLP. 8. Estimated rehab length of stay to reach the above functional goals is: NA 9. Anticipated D/C setting: Home 10. Anticipated post D/C treatments: HH therapy and Home excercise program 11. Overall Rehab/Functional Prognosis: good  RECOMMENDATIONS: This patient's condition is appropriate for continued rehabilitative care in the following setting: Pt doing functionally well with therapies, however, appears to have significant motor deficits.  Would recommend reeval by therapies.  If pt continues to do well recommend SNF, if pt's wife continues to believe she cannot care for patient.  Patient has agreed to participate in recommended program. Yes Note that insurance prior authorization may be required for reimbursement for recommended care.  Comment: Rehab Admissions Coordinator to follow up.  Delice Lesch, MD, ABPMR Lavon Paganini Angiulli, PA-C 06/04/2017

## 2017-06-04 NOTE — Evaluation (Signed)
Speech Language Pathology Evaluation Patient Details Name: Anthony Allen MRN: [id number] DOB: 1936/06/03 Today's Date: 06/04/2017 Time: 7673-4193 SLP Time Calculation (min) (ACUTE ONLY): 41 min  Problem List:  Patient Active Problem List   Diagnosis Date Noted  . Diastolic dysfunction   . Dysphagia, post-stroke   . CKD (chronic kidney disease), stage III ([place]) 06/03/2017  . 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 Stroke (cerebrum) ([place]) 06/03/2017  . CVA (cerebral vascular accident) ([place]) 06/01/2017  . TIA (transient ischemic attack) 05/31/2017  . Fatigue 05/08/2017  . Aortic atherosclerosis ([place]) 05/08/2017  . Glaucoma 05/06/2017  . Lipoma of forehead 11/02/2016  . Type II diabetes mellitus with renal manifestations ([place]) 11/02/2016  . Hyperlipidemia LDL goal <100 11/02/2016  . Constipation in male 11/02/2016  . Insomnia due to anxiety and fear 11/02/2016  . Colon cancer screening 05/04/2015  . Vitamin D deficiency 05/02/2015  . Medicare annual wellness visit, subsequent 06/23/2013  . Essential hypertension, benign 03/22/2013  . Osteoarthritis 03/22/2013  . Numbness and tingling in left hand 03/22/2013  . GERD (gastroesophageal reflux disease) 03/22/2013  . BPH (benign prostatic hyperplasia) 03/22/2013   Past Medical History:  Past Medical History:  Diagnosis Date  . Glaucoma   . Gout   . Hypertension   . Type 2 diabetes mellitus with hyperglycemia [facility])    Past Surgical History:  Past Surgical History:  Procedure Laterality Date  . TONSILLECTOMY     HPI:  81 y.o. male with medical history significant of hypertension, hyperlipidemia, diabetes mellitus, gout, glaucoma and newly diagnosed stroke, who presents with right-sided weakness, slurred speech, difficulty swallowing. Pt washospitalized from 12/22-12/24 due to left-sided weakness, slurred speech and difficulty swallowing.He was diagnosed as left pontine stroke. Pt hadold stroke workup including  MRI/MRA,carotid artery Dopplerand2-D echo. Per discharge summery,MRI of the brain showedleft-sided pontine CVA. MRA which showed some vertebral stenosis on the left side. Dual antiplatelet therapy with aspirinandPlavixwere started perneurologist recommendation.Carotid duplex showed no evidence of hemodynamically significant carotid artery stenosis.2d echoshowed normal EFwith no evidence of intracardiac thrombus. Patient was also seen by speech therapy and placed on a dysphagia 2 diet with nectar thick liquids. Pt was justdischarged home yesterday afternoon (06/02/17). Per his wife, patient continues to have difficulty swallowing. Hisright sided weakness seems to have worsened.   Assessment / Plan / Recommendation Clinical Impression   Pt administered MOCA [facility] Cognitive Assessment) with an overall score obtained of 26/30 which is within functional limits, but deficits in attention noted with calculations, selective attention tasks and pt also c/o intermittent word finding within conversation.  Emotional lability noted during assessment frequently, but pt redirected well with minimal verbal cueing from SLP; Dominant hand affected by CVA which impacted writing task legibility, but accuracy was adequate with executive function/visuospatial tasks (I.e.: able to draw clock, cube and complete puzzle); ST will f/u for cognitive deficits and dysphagia tx while in acute setting.      SLP Assessment  SLP Recommendation/Assessment: Patient needs continued Speech Language Pathology Services SLP Visit Diagnosis: Dysphagia, oropharyngeal phase (R13.12);Dysarthria and anarthria (R47.1);Attention and concentration deficit Attention and concentration deficit following: Cerebral infarction    Follow Up Recommendations  Other (comment)(TBD)    Frequency and Duration min 2x/week  1 week      SLP Evaluation Cognition  Overall Cognitive Status: Impaired/Different from baseline Arousal/Alertness:  Awake/alert Orientation Level: Oriented X4 Attention: Selective Selective Attention: Impaired Selective Attention Impairment: Verbal basic;Functional basic Memory: Appears intact Awareness: Appears intact Problem Solving: Appears intact Behaviors: Lability Safety/Judgment: Appears intact  Comprehension  Auditory Comprehension Overall Auditory Comprehension: Appears within functional limits for tasks assessed Visual Recognition/Discrimination Discrimination: Within Function Limits Reading Comprehension Reading Status: Within funtional limits    Expression Expression Primary Mode of Expression: Verbal Verbal Expression Overall Verbal Expression: Impaired Initiation: No impairment Level of Generative/Spontaneous Verbalization: Conversation Repetition: Impaired Level of Impairment: Sentence level Naming: Impairment Responsive: 76-100% accurate Confrontation: Within functional limits Convergent: 50-74% accurate Divergent: 50-74% accurate Other Naming Comments: Decreased organization; c/o anomia within conversation intermittently Pragmatics: Impairment Impairments: Abnormal affect Non-Verbal Means of Communication: Not applicable Written Expression Dominant Hand: Right Written Expression: Unable to assess (comment)(dominant hand affected)   Oral / Motor  Oral Motor/Sensory Function Overall Oral Motor/Sensory Function: Mild impairment Facial ROM: Reduced right Facial Symmetry: Abnormal symmetry right Facial Strength: Reduced right Facial Sensation: Reduced right Lingual ROM: Reduced right Lingual Symmetry: Abnormal symmetry right Lingual Strength: Reduced Lingual Sensation: Reduced Motor Speech Overall Motor Speech: Appears within functional limits for tasks assessed Respiration: Within functional limits Phonation: Low vocal intensity Resonance: Within functional limits Articulation: Impaired Level of Impairment: Sentence Intelligibility: Intelligibility  reduced Word: 75-100% accurate Phrase: 75-100% accurate Sentence: 50-74% accurate Conversation: 50-74% accurate Motor Planning: Witnin functional limits Motor Speech Errors: Not applicable Interfering Components: Premorbid status Effective Techniques: Slow rate;Increased vocal intensity;Over-articulate;Pause                       Elvina Sidle, M.S., CCC-SLP 06/04/2017, 11:46 AM

## 2017-06-04 NOTE — Progress Notes (Deleted)
Physician Discharge Summary  Anthony Allen JSH:[id number] DOB: 03-18-1936 DOA: 06/03/2017  PCP: Crecencio Mc, MD  Admit date: 06/03/2017 Discharge date: 06/04/2017  Admitted From: home Disposition:  SNF  This is a preliminary d/c summary. The patient will be ready for discharge tomorrow   Recommendations for Outpatient Follow-up:  1. F/u with ENT for parotid mass 2. F/u on JAK 2 Mutation  Discharge Condition:  stable   CODE STATUS:  Full code   Diet recommendation:   Consultations:  neuro    Discharge Diagnoses:  Principal Problem:   CVA (cerebral vascular accident) ([place]) Active Problems:   Essential hypertension, benign   Type II diabetes mellitus with renal manifestations ([place])   Hyperlipidemia LDL goal <100   Glaucoma   CKD (chronic kidney disease), stage III (HCC)   Parapharyngeal space mass   Diastolic dysfunction   Dysphagia, post-stroke    Subjective: No new complaints. Weak on right side. Speech seems slurred to patient and wife.   Brief Summary:  Anthony Allen is a 81 y.o. male with medical history significant of hypertension, hyperlipidemia, diabetes mellitus, gout, glaucoma and newly diagnosed stroke, who presents with right-sided weakness, slurred speech, difficulty swallowing. Pt was hospitalized from 12/22-12/24 due to left-sided weakness, slurred speech and difficulty swallowing. He was diagnosed as left pontine stroke. Dual antiplatelet therapy with aspirin and Plavix were started per neurologist recommendation. He was discharged home on 12/25 and returns for ongoing weakness which is possibly worse than before.   Hospital Course:  Left pontine infarct - right sided weakness and dysphagia - per wife he had waxing and waning weakness in the hospital and on the day of discharge was able to ambulate with the therapist but once he got home he had trouble with it and was therefore brought back the next day - repeat imaging with CTA head and neck is  negative -  PT recommends CIR - SLP recommends recommends d2 diet with nectar thick liquids  HTN - cont home meds  DM2 - A1c 6.6 - cont Jardiance   HLD - cont Crestor  MRI showing mass at [facility] - 4 x 1 cross-section deep lobe LEFT parotid mass with extension to the parapharyngeal space. 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  - have discussed with Dr Benjamine Mola who recommends outpt f/u with him  Elevated Hb - chronic issue- Hb ranges from 16-18 - JAK2 mutation study ordered by admitting physician  CKD 3 - stable   Discharge Instructions  Discharge Instructions    Diet - low sodium heart healthy   Complete by:  As directed    Increase activity slowly   Complete by:  As directed      Allergies as of 06/04/2017   No Known Allergies     Medication List    STOP taking these medications   BC HEADACHE POWDER PO     TAKE these medications   aspirin EC 81 MG tablet Take 81 mg by mouth daily.   clopidogrel 75 MG tablet Commonly known as:  PLAVIX Take 1 tablet (75 mg total) by mouth daily.   empagliflozin 10 MG Tabs tablet Commonly known as:  JARDIANCE Take 10 mg by mouth daily.   fluticasone 50 MCG/ACT nasal spray Commonly known as:  FLONASE Place 2 sprays into both nostrils daily as needed for allergies or rhinitis.   glucose blood test strip Use as instructed   HYDROCORTISONE (TOPICAL) 2 % Lotn Apply 1 application topically as needed (itching).   latanoprost 0.005 %  ophthalmic solution Commonly known as:  XALATAN Instill 1 drop into both eyes in the evening   losartan 50 MG tablet Commonly known as:  COZAAR Take 1 tablet (50 mg total) by mouth daily.   multivitamin-lutein Caps capsule Take 1 capsule by mouth daily.   onetouch ultrasoft lancets Use as instructed   rosuvastatin 20 MG tablet Commonly known as:  CRESTOR Take 1 tablet (20 mg total) by mouth daily. What changed:  Another medication with the same name was removed. Continue taking this  medication, and follow the directions you see here.   timolol 0.5 % ophthalmic solution Commonly known as:  TIMOPTIC Instill 1 drop into the right eye in the morning   Vitamin D-3 1000 units Caps Take 1,000 Units by mouth daily.       No Known Allergies   Procedures/Studies:    Ct Angio Head W Or Wo Contrast  Result Date: 06/03/2017 CLINICAL DATA:  Recent stroke. Continued right-sided weakness slurred speech. EXAM: CT ANGIOGRAPHY HEAD AND NECK TECHNIQUE: Multidetector CT imaging of the head and neck was performed using the standard protocol during bolus administration of intravenous contrast. Multiplanar CT image reconstructions and MIPs were obtained to evaluate the vascular anatomy. Carotid stenosis measurements (when applicable) are obtained utilizing NASCET criteria, using the distal internal carotid diameter as the denominator. CONTRAST:  48mL ISOVUE-370 IOPAMIDOL (ISOVUE-370) INJECTION 76% COMPARISON:  Head CT and brain MRI from 2 days ago FINDINGS: CTA NECK FINDINGS Aortic arch: Atherosclerosis. Three vessel branching. No acute finding. Right carotid system: Moderate primarily calcified plaque at the ICA bulb without flow limiting stenosis or ulceration. Left carotid system: Moderate calcified and noncalcified plaque of the proximal ICA without flow limiting stenosis or ulceration. Vertebral arteries: Prominent irregular and ulcerated plaque in the proximal left subclavian artery. 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 The dominant right vertebral artery shows no flow limiting stenosis or ulceration. Skeleton: Excavation of the left mandible body contiguous with mucosa at the alveolar ridge. Other neck: There is a known left deep parotid and parapharyngeal mass has better seen by prior MRI and enhanced neck CT, approximately 4 x 1 cm. Subgaleal lipoma of the forehead. Upper chest: No acute finding Review of  the MIP images confirms the above findings CTA HEAD FINDINGS Anterior circulation: Atherosclerotic plaque on the carotid siphons. No major branch occlusion. Chronic azygos appearance of the left A2 and aplastic left A1 segment. A small truncated right A2 segment is visible by CTA. Negative for aneurysm. Posterior circulation: Left vertebral occlusion of the dura. Atherosclerosis of the right V4 segment. Moderate narrowing of the proximal basilar. Fetal type left PCA. Atherosclerotic irregularity of bilateral PCA with bilateral distal P2 with severe stenosis, with flow gap on the left. Right PICA stenosis that was better seen on previous MRA. Venous sinuses: Patent Anatomic variants: As above Delayed phase: No abnormal intracranial enhancement. A left pontine acute infarct by MRI is subtle by CT. Remote right basal ganglia infarct. Review of the MIP images confirms the above findings IMPRESSION: 1. No acute intracranial finding when compared to brain MRI and MRA 2 days prior. 2. A known acute left pontine infarct is subtle by CT. 3. 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 Notable irregular plaque at the proximal left subclavian. 4. 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 5. Known left parapharyngeal and deep parotid mass/neoplasm. ENT referral has been recommended previously. 6. Alveolar ridge  and body erosion of the left mandible, recommend mucosal exam to exclude underlying destructive lesion. Electronically Signed   By: Monte Fantasia M.D.   On: 06/03/2017 21:48   Ct Head Wo Contrast  Result Date: 06/03/2017 CLINICAL DATA:  Right-sided weakness, slurred speech. EXAM: CT HEAD WITHOUT CONTRAST TECHNIQUE: Contiguous axial images were obtained from the base of the skull through the vertex without intravenous contrast. COMPARISON:  CT scan of May 31, 2017. MRI of June 01, 2017. FINDINGS: Brain: Mild chronic ischemic white matter disease is noted. Faint low density is noted in left  pons which may correspond to infarction identified on prior MRI. No mass effect or midline shift is noted. Ventricular size is within normal limits. No hemorrhage or mass lesion is noted. Vascular: No hyperdense vessel or unexpected calcification. Skull: Normal. Negative for fracture or focal lesion. Sinuses/Orbits: No acute finding. Other: None. IMPRESSION: Mild chronic ischemic white matter disease. Possible low density seen in left pons which may correspond to infarction seen on prior MRI. No other abnormality is seen. Electronically Signed   By: Marijo Conception, M.D.   On: 06/03/2017 20:52   Ct Head Wo Contrast  Result Date: 05/31/2017 CLINICAL DATA:  Slurred speech.  Weakness. EXAM: CT HEAD WITHOUT CONTRAST TECHNIQUE: Contiguous axial images were obtained from the base of the skull through the vertex without intravenous contrast. COMPARISON:  None. FINDINGS: Brain: Mild chronic ischemic white matter disease is noted. No mass effect or midline shift is noted. Ventricular size is within normal limits. There is no evidence of mass lesion, hemorrhage or acute infarction. Vascular: No hyperdense vessel or unexpected calcification. Skull: Normal. Negative for fracture or focal lesion. Sinuses/Orbits: Mild left maxillary sinusitis is noted. Other: Probable lipoma is seen in the frontal scalp. IMPRESSION: Mild chronic ischemic white matter disease. No acute intracranial abnormality seen. Electronically Signed   By: Marijo Conception, M.D.   On: 05/31/2017 11:24   Ct Soft Tissue Neck W Contrast  Result Date: 06/01/2017 CLINICAL DATA:  Continued surveillance of a LEFT pre styloid parapharyngeal mass. Difficulty swallowing. Acute pontine infarct. EXAM: CT NECK WITH CONTRAST TECHNIQUE: Multidetector CT imaging of the neck was performed using the standard protocol following the bolus administration of intravenous contrast. CONTRAST:  38mL ISOVUE-300 IOPAMIDOL (ISOVUE-300) INJECTION 61% COMPARISON:  MR brain  06/01/2017. FINDINGS: Pharynx and larynx: No intrinsic pharyngeal or laryngeal pathology. Salivary glands: Normal RIGHT parotid gland and submandibular glands. Inseparable from the LEFT lobe of the parotid is a tubular 4 x 1 pre styloid mass extending from the parotid space into the parapharyngeal space. 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 ENT consultation is warranted (on elective basis). Thyroid: None. Lymph nodes: None enlarged or abnormal density. Vascular: No vascular occlusion. Carotid bifurcation atherosclerosis. Calcification of the cavernous internal carotid arteries consistent with cerebrovascular atherosclerotic disease. Limited intracranial: Negative. The pontine infarct is not visible on CT. Visualized orbits: No orbital masses. Mastoids and visualized paranasal sinuses: Mastoids are clear. Layering fluid LEFT maxillary sinus. Skeleton: Cervical spondylosis. Multiple missing teeth, including a large area of osseous destruction in the body of the LEFT mandible, 1 x 2 cm cross-section lateral cortex disruption, suspect focal osteomyelitis of the LEFT mandible related to periodontal disease. Upper chest: No nodule or pneumothorax. Other: None. IMPRESSION: 4 x 1 cross-section deep lobe LEFT parotid mass with extension to the parapharyngeal space. 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 Elective ENT consultation is warranted. No airway displacement or obstruction. Lytic area of destruction in the LEFT mandibular body,  approximate 1 x 2 cm, suspect focal osteomyelitis related to periodontal disease. Oral and maxillofacial consultation is warranted. Electronically Signed   By: Staci Righter M.D.   On: 06/01/2017 14:58   Ct Angio Neck W Or Wo Contrast  Result Date: 06/03/2017 CLINICAL DATA:  Recent stroke. Continued right-sided weakness slurred speech. EXAM: CT ANGIOGRAPHY HEAD AND NECK TECHNIQUE: Multidetector CT imaging of the head and neck was performed using the standard protocol during bolus  administration of intravenous contrast. Multiplanar CT image reconstructions and MIPs were obtained to evaluate the vascular anatomy. Carotid stenosis measurements (when applicable) are obtained utilizing NASCET criteria, using the distal internal carotid diameter as the denominator. CONTRAST:  82mL ISOVUE-370 IOPAMIDOL (ISOVUE-370) INJECTION 76% COMPARISON:  Head CT and brain MRI from 2 days ago FINDINGS: CTA NECK FINDINGS Aortic arch: Atherosclerosis. Three vessel branching. No acute finding. Right carotid system: Moderate primarily calcified plaque at the ICA bulb without flow limiting stenosis or ulceration. Left carotid system: Moderate calcified and noncalcified plaque of the proximal ICA without flow limiting stenosis or ulceration. Vertebral arteries: Prominent irregular and ulcerated plaque in the proximal left subclavian artery. 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 The dominant right vertebral artery shows no flow limiting stenosis or ulceration. Skeleton: Excavation of the left mandible body contiguous with mucosa at the alveolar ridge. Other neck: There is a known left deep parotid and parapharyngeal mass has better seen by prior MRI and enhanced neck CT, approximately 4 x 1 cm. Subgaleal lipoma of the forehead. Upper chest: No acute finding Review of the MIP images confirms the above findings CTA HEAD FINDINGS Anterior circulation: Atherosclerotic plaque on the carotid siphons. No major branch occlusion. Chronic azygos appearance of the left A2 and aplastic left A1 segment. A small truncated right A2 segment is visible by CTA. Negative for aneurysm. Posterior circulation: Left vertebral occlusion of the dura. Atherosclerosis of the right V4 segment. Moderate narrowing of the proximal basilar. Fetal type left PCA. Atherosclerotic irregularity of bilateral PCA with bilateral distal P2 with severe stenosis, with flow gap on  the left. Right PICA stenosis that was better seen on previous MRA. Venous sinuses: Patent Anatomic variants: As above Delayed phase: No abnormal intracranial enhancement. A left pontine acute infarct by MRI is subtle by CT. Remote right basal ganglia infarct. Review of the MIP images confirms the above findings IMPRESSION: 1. No acute intracranial finding when compared to brain MRI and MRA 2 days prior. 2. A known acute left pontine infarct is subtle by CT. 3. 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 Notable irregular plaque at the proximal left subclavian. 4. 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 5. Known left parapharyngeal and deep parotid mass/neoplasm. ENT referral has been recommended previously. 6. Alveolar ridge and body erosion of the left mandible, recommend mucosal exam to exclude underlying destructive lesion. Electronically Signed   By: Monte Fantasia M.D.   On: 06/03/2017 21:48   Mr Brain Wo Contrast  Result Date: 06/01/2017 CLINICAL DATA:  TIA. Intermittent weakness and slurred speech with loss of balance. EXAM: MRI HEAD WITHOUT CONTRAST MRA HEAD WITHOUT CONTRAST TECHNIQUE: Multiplanar, multiecho pulse sequences of the brain and surrounding structures were obtained without intravenous contrast. Angiographic images of the head were obtained using MRA technique without contrast. COMPARISON:  Head CT from yesterday FINDINGS: MRI HEAD FINDINGS Brain: Confluent restricted diffusion in the left pons. Remote right corona radiata infarct with  atrophic right midbrain and pons attributed to wallerian changes. Chronic small vessel ischemic gliosis in the cerebral white matter. No hemorrhage, hydrocephalus, or masslike finding. Vascular: Abnormal left vertebral flow void, see below. Skull and upper cervical spine: Negative for marrow lesion. Subgaleal lipoma in the central forehead measuring up to 2.7 cm. Sinuses/Orbits: No acute finding. Mucous retention cyst in the left maxillary  sinus. Other: Pre styloid mass in the left parapharyngeal fat extending to the deep lobe of the parotid. The homogeneous mass measures 4 x 1.2 cm where covered. MRA HEAD FINDINGS The non dominant left vertebral artery is occluded. Moderate proximal basilar narrowing. Fetal type left PCA. Advanced stenosis of the bilateral P2 segments with asymmetric poor flow on the left. high-grade right proximal pica stenosis. Symmetric carotid arteries. Aplastic left A1 segment. Azygos A2 segment. No branch occlusion. Negative for aneurysm. These results were called by telephone at the time of interpretation on 06/01/2017 at 12:59 pm to Dr. Verdell Carmine, who verbally acknowledged these results. IMPRESSION: Brain MRI: 1. Acute left pontine infarct. 2. Remote right corona radiata infarct . 3. 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 Recommend ENT referral if clinically appropriate. Intracranial MRA 1. Left vertebral occlusion that is age indeterminate. 2. Advanced intracranial atherosclerosis. There is high-grade right PICA and bilateral P2 segment stenoses. Moderate mid basilar stenosis. Electronically Signed   By: Monte Fantasia M.D.   On: 06/01/2017 13:01   US Carotid Bilateral (at [facility] And [facility] Only)  Result Date: 06/01/2017 CLINICAL DATA:  TIA, hypertension and diabetes. EXAM: BILATERAL CAROTID DUPLEX ULTRASOUND TECHNIQUE: Pearline Cables scale imaging, color Doppler and duplex ultrasound were performed of bilateral carotid and vertebral arteries in the neck. COMPARISON:  None. FINDINGS: Criteria: Quantification of carotid stenosis is based on velocity parameters that correlate the residual internal carotid diameter with NASCET-based stenosis levels, using the diameter of the distal internal carotid lumen as the denominator for stenosis measurement. The following velocity measurements were obtained: RIGHT ICA:  71/20 cm/sec CCA:  01/02 cm/sec SYSTOLIC ICA/CCA RATIO:  0.8 DIASTOLIC ICA/CCA  RATIO:  1.8 ECA:  143 cm/sec LEFT ICA:  78/9 cm/sec CCA:  72/5 cm/sec SYSTOLIC ICA/CCA RATIO:  0.9 DIASTOLIC ICA/CCA RATIO:  3 ECA:  136 cm/sec RIGHT CAROTID ARTERY: There is a mild amount of calcified plaque at the level of the carotid bulb and proximal right ICA. Estimated right ICA stenosis is less than 50%. RIGHT VERTEBRAL ARTERY: Antegrade flow with normal waveform and velocity. LEFT CAROTID ARTERY: Minimal amount of partially calcified plaque is present at the level of the carotid bulb. This approaches the ICA origin. Estimated left ICA stenosis is less than 50%. LEFT VERTEBRAL ARTERY: Antegrade flow with normal waveform and velocity. IMPRESSION: Mild amount of calcified plaque at the level of the right carotid bulb and proximal right ICA. Minimal plaque at the level of the left carotid bulb and left ICA origin. Estimated bilateral ICA stenoses are less than 50%. Electronically Signed   By: Aletta Edouard M.D.   On: 06/01/2017 09:05   Mr Jodene Nam Head/brain DG Cm  Result Date: 06/01/2017 CLINICAL DATA:  TIA. Intermittent weakness and slurred speech with loss of balance. EXAM: MRI HEAD WITHOUT CONTRAST MRA HEAD WITHOUT CONTRAST TECHNIQUE: Multiplanar, multiecho pulse sequences of the brain and surrounding structures were obtained without intravenous contrast. Angiographic images of the head were obtained using MRA technique without contrast. COMPARISON:  Head CT from yesterday FINDINGS: MRI HEAD FINDINGS Brain: Confluent restricted diffusion in the  left pons. Remote right corona radiata infarct with atrophic right midbrain and pons attributed to wallerian changes. Chronic small vessel ischemic gliosis in the cerebral white matter. No hemorrhage, hydrocephalus, or masslike finding. Vascular: Abnormal left vertebral flow void, see below. Skull and upper cervical spine: Negative for marrow lesion. Subgaleal lipoma in the central forehead measuring up to 2.7 cm. Sinuses/Orbits: No acute finding. Mucous retention  cyst in the left maxillary sinus. Other: Pre styloid mass in the left parapharyngeal fat extending to the deep lobe of the parotid. The homogeneous mass measures 4 x 1.2 cm where covered. MRA HEAD FINDINGS The non dominant left vertebral artery is occluded. Moderate proximal basilar narrowing. Fetal type left PCA. Advanced stenosis of the bilateral P2 segments with asymmetric poor flow on the left. high-grade right proximal pica stenosis. Symmetric carotid arteries. Aplastic left A1 segment. Azygos A2 segment. No branch occlusion. Negative for aneurysm. These results were called by telephone at the time of interpretation on 06/01/2017 at 12:59 pm to Dr. Verdell Carmine, who verbally acknowledged these results. IMPRESSION: Brain MRI: 1. Acute left pontine infarct. 2. Remote right corona radiata infarct . 3. 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 Recommend ENT referral if clinically appropriate. Intracranial MRA 1. Left vertebral occlusion that is age indeterminate. 2. Advanced intracranial atherosclerosis. There is high-grade right PICA and bilateral P2 segment stenoses. Moderate mid basilar stenosis. Electronically Signed   By: Monte Fantasia M.D.   On: 06/01/2017 13:01       Discharge Exam: Vitals:   06/04/17 1000 06/04/17 1048  BP: 138/71 140/77  Pulse:  82  Resp: 18 19  Temp: 97.7 F (36.5 C) 97.8 F (36.6 C)  SpO2: 96% 99%   Vitals:   06/04/17 0300 06/04/17 0608 06/04/17 1000 06/04/17 1048  BP: (!) 133/59 (!) 147/68 138/71 140/77  Pulse: 70 74  82  Resp:   18 19  Temp: 98.1 F (36.7 C) 97.8 F (36.6 C) 97.7 F (36.5 C) 97.8 F (36.6 C)  TempSrc: Oral Oral Oral Oral  SpO2: 97% 94% 96% 99%  Weight:      Height:        General: Pt is alert, awake, not in acute distress Cardiovascular: RRR, S1/S2 +, no rubs, no gallops Respiratory: CTA bilaterally, no wheezing, no rhonchi Abdominal: Soft, NT, ND, bowel sounds + Extremities: no edema, no  cyanosis    The results of significant diagnostics from this hospitalization (including imaging, microbiology, ancillary and laboratory) are listed below for reference.     Microbiology: No results found for this or any previous visit (from the past 240 hour(s)).   Labs: BNP (last 3 results) No results for input(s): BNP in the last 8760 hours. Basic Metabolic Panel: Recent Labs  Lab 05/31/17 1044 06/03/17 1923 06/03/17 1939  NA 138 138 141  K 3.9 4.2 4.2  CL 108 103 104  CO2 23 24  --   GLUCOSE 202* 167* 164*  BUN 17 31* 33*  CREATININE 1.57* 1.64* 1.60*  CALCIUM 9.2 10.1  --    Liver Function Tests: Recent Labs  Lab 05/31/17 1044 06/03/17 1923  AST 31 35  ALT 41 37  ALKPHOS 84 79  BILITOT 1.1 1.4*  PROT 6.7 7.1  ALBUMIN 3.7 4.0   No results for input(s): LIPASE, AMYLASE in the last 168 hours. No results for input(s): AMMONIA in the last 168 hours. CBC: Recent Labs  Lab 05/31/17 1044 06/03/17 1923 06/03/17 1939  WBC 8.3 8.9  --   NEUTROABS 6.6* 6.4  --   HGB 16.3 18.8* 18.0*  HCT 47.8 52.0 53.0*  MCV 94.7 93.0  --   PLT 155 197  --    Cardiac Enzymes: Recent Labs  Lab 05/31/17 1044  TROPONINI <0.03   BNP: Invalid input(s): POCBNP CBG: Recent Labs  Lab 06/02/17 0730 06/02/17 1134 06/03/17 2336 06/04/17 0641 06/04/17 1058  GLUCAP 128* 220* 134* 161* 149*   D-Dimer No results for input(s): DDIMER in the last 72 hours. Hgb A1c No results for input(s): HGBA1C in the last 72 hours. Lipid Profile No results for input(s): CHOL, HDL, LDLCALC, TRIG, CHOLHDL, LDLDIRECT in the last 72 hours. Thyroid function studies No results for input(s): TSH, T4TOTAL, T3FREE, THYROIDAB in the last 72 hours.  Invalid input(s): FREET3 Anemia work up No results for input(s): VITAMINB12, FOLATE, FERRITIN, TIBC, IRON, RETICCTPCT in the last 72 hours. Urinalysis    Component Value Date/Time   COLORURINE STRAW (A) 05/31/2017 1803   APPEARANCEUR CLEAR (A)  05/31/2017 1803   LABSPEC 1.013 05/31/2017 1803   PHURINE 5.0 05/31/2017 1803   GLUCOSEU >=500 (A) 05/31/2017 1803   HGBUR NEGATIVE 05/31/2017 1803   BILIRUBINUR NEGATIVE 05/31/2017 1803   KETONESUR 5 (A) 05/31/2017 1803   PROTEINUR NEGATIVE 05/31/2017 1803   NITRITE NEGATIVE 05/31/2017 1803   LEUKOCYTESUR NEGATIVE 05/31/2017 1803   Sepsis Labs Invalid input(s): PROCALCITONIN,  WBC,  LACTICIDVEN Microbiology No results found for this or any previous visit (from the past 240 hour(s)).   Time coordinating discharge: Over 30 minutes  SIGNED:   Debbe Odea, MD  [facility] 06/04/2017, 12:23 PM Pager   If 7PM-7AM, please contact night-coverage [email or web address] Password [email or web address]

## 2017-06-04 NOTE — Plan of Care (Signed)
Discussed anticoagulants, HTN meds, lipid reducers, ambien, scds rationale, purpose and indications.  Reviewed s/s of DVT, Mi, PE and stroke.

## 2017-06-04 NOTE — Evaluation (Signed)
Physical Therapy Evaluation Patient Details Name: Anthony Allen MRN: [id number] DOB: [birth year] Today's Date: 06/04/2017   History of Present Illness  81 y.o. male presents with slurred speech, dificulty swallowing, and right-sided weakness. Pt had been discharged from [facility] 12/24 after left pontine stroke, however progressively declining status brought him to [facility] 12/25. 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 PMH signifiacnt of HTN, DM2, HLD, and glaucoma.   Clinical Impression  Pt presents with impaired mobility, right-sided UE and LE weakness/hypotonia, and decreased balance secondary to above. Pt was tearful during session, as he appears to be aware of his sudden decline in functional status. Pt's wife in room and motivating pt by saying "When you get better, we are going to learn how to dance the shag together." Pt tolerates short-distance ambulation (10-ft x2) with min assist from PT, LUE on hand railing, and verbal cueing. Pt is motivated to improve strength and is hopeful to return to prior level of function (independent). Pt is a good candidate for CIR and has 24/7 support from his wife. Pt will benefit from skilled PT acutely in order to increase level of independence and improve mobility status while in hospital setting. PT will follow.     Follow Up Recommendations CIR;Supervision for mobility/OOB    Equipment Recommendations  Other (comment)(defer to next venue)    Recommendations for Other Services       Precautions / Restrictions Precautions Precautions: Fall Restrictions Weight Bearing Restrictions: No      Mobility  Bed Mobility Overal bed mobility: Needs Assistance Bed Mobility: Rolling;Sidelying to Sit Rolling: Supervision Sidelying to sit: HOB elevated;Mod assist       General bed mobility comments: Pt automatically grabs handrail to aid in rolling. Pt requiring increased time with bed mobility and VCs to  swing legs off of bed and push up using UEs on bed.   Transfers Overall transfer level: Needs assistance Equipment used: (back of chair) Transfers: Sit to/from Stand Sit to Stand: Min assist         General transfer comment: sit-to-stand from EOB x2. PT assist to position RUE on back of chair. sit-to-stand from chair x2. Pt educated on positioning BUEs on RLE to stand and force weight-bearing through RLE.   Ambulation/Gait Ambulation/Gait assistance: Min assist Ambulation Distance (Feet): 10 Feet(+10) Assistive device: (handrail on left) Gait Pattern/deviations: Step-to pattern;Decreased step length - right;Decreased step length - left;Decreased weight shift to right;Narrow base of support;Trunk flexed Gait velocity: decreased Gait velocity interpretation: <1.8 ft/sec, indicative of risk for recurrent falls General Gait Details: Pt leaning towards left side, gripping handrail tightly. Pt with right knee instability, guarded by PT at all times. VCs given for longer step lengths and upright posture. Pt requiring one seated rest break.   Stairs            Wheelchair Mobility    Modified Rankin (Stroke Patients Only) Modified Rankin (Stroke Patients Only) Pre-Morbid Rankin Score: Slight disability Modified Rankin: Moderately severe disability     Balance Overall balance assessment: Needs assistance Sitting-balance support: Feet supported;Single extremity supported Sitting balance-Leahy Scale: Poor Sitting balance - Comments: Pt unable to maintain sitting balance EOB without support from LUE. Pt demonstrates decreased core strength, most notable at right side, as he leans to the left while sitting EOB. VCs to sit upright. and activate core, with inability to sustain.  Postural control: Left lateral lean Standing balance support: Single extremity supported;During functional activity Standing balance-Leahy Scale: Poor Standing  balance comment: Pt with LUE on support surface at  all times to maintain standing balance, and PT min assist for dynamic activities such as walking.                              Pertinent Vitals/Pain Pain Assessment: No/denies pain("just hunger pains")    Home Living Family/patient expects to be discharged to:: Private residence Living Arrangements: Spouse/significant other Available Help at Discharge: Family;Available 24 hours/day Type of Home: House Home Access: Ramped entrance     Home Layout: One level Home Equipment: None      Prior Function Level of Independence: Independent         Comments: Pt previously independent, driving, and taking care of 7 acre land.      Hand Dominance   Dominant Hand: Right    Extremity/Trunk Assessment   Upper Extremity Assessment Upper Extremity Assessment: Defer to OT evaluation;RUE deficits/detail RUE Deficits / Details: Pt able to left RUE against gravity through less than half of ROM. decreased grip strength compared to left side.  RUE Coordination: decreased gross motor;decreased fine motor    Lower Extremity Assessment Lower Extremity Assessment: RLE deficits/detail RLE Deficits / Details: knee extension 2+/5, ankle DF 2+/5, hip flexion 2+/5 RLE Coordination: decreased fine motor;decreased gross motor    Cervical / Trunk Assessment Cervical / Trunk Assessment: Normal  Communication   Communication: Expressive difficulties  Cognition Arousal/Alertness: Awake/alert Behavior During Therapy: WFL for tasks assessed/performed Overall Cognitive Status: Within Functional Limits for tasks assessed                                 General Comments: Pt cries a few times throughout session, most notably when PT gives him words of encouragement. Pt seems to be aware of his deficits, and probably saddened by his sudden change in functional status.       General Comments General comments (skin integrity, edema, etc.): Pt's wife present in room upon PT  arrival. Both pt and pt's wife educated on s/s of stroke. Both pt and pt's wife tearful.    Exercises Other Exercises Other Exercises: weight-shifting in standing (30 seconds) Other Exercises: static standing (30 seconds)   Assessment/Plan    PT Assessment Patient needs continued PT services  PT Problem List Impaired tone;Decreased mobility;Decreased balance       PT Treatment Interventions DME instruction;Gait training;Stair training;Therapeutic exercise;Therapeutic activities;Functional mobility training;Balance training;Patient/family education;Neuromuscular re-education    PT Goals (Current goals can be found in the Care Plan section)  Acute Rehab PT Goals Patient Stated Goal: to return strength PT Goal Formulation: With patient/family Time For Goal Achievement: 06/18/17 Potential to Achieve Goals: Good    Frequency Min 4X/week   Barriers to discharge        Co-evaluation               AM-PAC PT "6 Clicks" Daily Activity  Outcome Measure Difficulty turning over in bed (including adjusting bedclothes, sheets and blankets)?: A Little Difficulty moving from lying on back to sitting on the side of the bed? : Unable Difficulty sitting down on and standing up from a chair with arms (e.g., wheelchair, bedside commode, etc,.)?: Unable Help needed moving to and from a bed to chair (including a wheelchair)?: A Little Help needed walking in hospital room?: A Little Help needed climbing 3-5 steps with a railing? : Total 6  Click Score: 12    End of Session Equipment Utilized During Treatment: Gait belt Activity Tolerance: Patient tolerated treatment well Patient left: in chair;with family/visitor present;with call bell/phone within reach   PT Visit Diagnosis: Difficulty in walking, not elsewhere classified (R26.2);Unsteadiness on feet (R26.81);Hemiplegia and hemiparesis Hemiplegia - Right/Left: Right Hemiplegia - dominant/non-dominant: Dominant Hemiplegia - caused by:  Cerebral infarction    Time: 3601-6580 PT Time Calculation (min) (ACUTE ONLY): 32 min   Charges:   PT Evaluation $PT Eval Low Complexity: 1 Low PT Treatments $Therapeutic Activity: 8-22 mins   PT G Codes:        Judee Clara, SPT  Judee Clara 06/04/2017, 10:39 AM

## 2017-06-04 NOTE — Discharge Instructions (Signed)
You will need to f/u with ENT for the mass in your neck which needs to be biopsied.  Please take all your medications with you for your next visit with your Primary MD. Please request your Primary MD to go over all hospital test results at the follow up. Please ask your Primary MD to get all Hospital records sent to his/her office.  If you experience worsening of your admission symptoms, develop shortness of breath, chest pain, suicidal or homicidal thoughts or a life threatening emergency, you must seek medical attention immediately by calling 911 or calling your MD.  Dennis Bast must read the complete instructions/literature along with all the possible adverse reactions/side effects for all the medicines you take including new medications that have been prescribed to you. Take new medicines after you have completely understood and accpet all the possible adverse reactions/side effects.   Do not drive when taking pain medications or sedatives.    Do not take more than prescribed Pain, Sleep and Anxiety Medications  If you have smoked or chewed Tobacco in the last 2 yrs please stop. Stop any regular alcohol and or recreational drug use.  Wear Seat belts while driving.

## 2017-06-04 NOTE — Evaluation (Signed)
Occupational Therapy Evaluation Patient Details Name: Anthony Allen MRN: [id number] DOB: [birth year] Today's Date: 06/04/2017    History of Present Illness 81 y.o. male presents with slurred speech, dificulty swallowing, and right-sided weakness. Pt had been discharged from [facility] 12/24 after left pontine stroke, however progressively declining status brought him to [facility] 12/25. 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 PMH signifiacnt of HTN, DM2, HLD, and glaucoma.    Clinical Impression   Pt reports he was independent with ADL PTA. Currently pt overall mod assist for functional mobility and mod-max assist for ADL. Pt presenting with impaired balance, R sided weakness/impaired coordination, speech deficits, ?cognitive deficits impacting his independence and safety with ADL and functional mobility. Recommending CIR level therapies to maximize independence and safety with ADL and functional mobility prior to return home. Pt would benefit from continued skilled OT to address established goals.    Follow Up Recommendations  CIR;Supervision/Assistance - 24 hour    Equipment Recommendations  Other (comment)(TBD at next venue)    Recommendations for Other Services Rehab consult     Precautions / Restrictions Precautions Precautions: Fall Restrictions Weight Bearing Restrictions: No      Mobility Bed Mobility               General bed mobility comments: Pt OOB in chair upon arrival  Transfers Overall transfer level: Needs assistance Equipment used: Rolling walker (2 wheeled) Transfers: Sit to/from Stand Sit to Stand: Min assist;Mod assist         General transfer comment: Mod from chair, min from toilet. Cues for hand placement and technique.    Balance Overall balance assessment: Needs assistance Sitting-balance support: Feet supported Sitting balance-Leahy Scale: Poor     Standing balance support: Single extremity  supported Standing balance-Leahy Scale: Poor                             ADL either performed or assessed with clinical judgement   ADL Overall ADL's : Needs assistance/impaired Eating/Feeding: Moderate assistance;Sitting   Grooming: Moderate assistance;Sitting;Wash/dry hands   Upper Body Bathing: Moderate assistance;Sitting   Lower Body Bathing: Maximal assistance;Sit to/from stand   Upper Body Dressing : Moderate assistance;Sitting   Lower Body Dressing: Maximal assistance;Sit to/from stand   Toilet Transfer: Moderate assistance;Ambulation;Regular Toilet;Grab bars;RW   Toileting- Clothing Manipulation and Hygiene: Minimal assistance;Sit to/from stand Toileting - Clothing Manipulation Details (indicate cue type and reason): for peri care only     Functional mobility during ADLs: Moderate assistance;Rolling walker       Vision Baseline Vision/History: Wears glasses Wears Glasses: Reading only Patient Visual Report: No change from baseline Vision Assessment?: No apparent visual deficits     Perception     Praxis      Pertinent Vitals/Pain Pain Assessment: No/denies pain     Hand Dominance Right   Extremity/Trunk Assessment Upper Extremity Assessment Upper Extremity Assessment: RUE deficits/detail RUE Deficits / Details: Grossly 2/5. Decreased grip strength. No sensation changes. RUE Coordination: decreased fine motor;decreased gross motor   Lower Extremity Assessment Lower Extremity Assessment: Defer to PT evaluation   Cervical / Trunk Assessment Cervical / Trunk Assessment: Normal   Communication Communication Communication: Expressive difficulties   Cognition Arousal/Alertness: Awake/alert Behavior During Therapy: WFL for tasks assessed/performed Overall Cognitive Status: No family/caregiver present to determine baseline cognitive functioning  General Comments       Exercises      Shoulder Instructions      Home Living Family/patient expects to be discharged to:: Private residence Living Arrangements: Spouse/significant other;Children Available Help at Discharge: Family;Friend(s);Available 24 hours/day Type of Home: House Home Access: Ramped entrance     Home Layout: One level     Bathroom Shower/Tub: Occupational psychologist: Standard     Home Equipment: Environmental consultant - 2 wheels          Prior Functioning/Environment Level of Independence: Independent                 OT Problem List: Decreased strength;Decreased range of motion;Decreased activity tolerance;Impaired balance (sitting and/or standing);Decreased coordination;Decreased knowledge of use of DME or AE;Impaired tone;Impaired UE functional use      OT Treatment/Interventions: Self-care/ADL training;Therapeutic exercise;Neuromuscular education;Energy conservation;DME and/or AE instruction;Therapeutic activities;Patient/family education;Balance training    OT Goals(Current goals can be found in the care plan section) Acute Rehab OT Goals Patient Stated Goal: return to independence OT Goal Formulation: With patient Time For Goal Achievement: 06/18/17 Potential to Achieve Goals: Good ADL Goals Pt Will Perform Grooming: with min assist;standing Pt Will Perform Upper Body Bathing: with min assist;sitting Pt Will Perform Lower Body Bathing: with min assist;sit to/from stand Pt Will Transfer to Toilet: with min guard assist;ambulating;bedside commode Pt Will Perform Toileting - Clothing Manipulation and hygiene: with min assist;sit to/from stand  OT Frequency: Min 3X/week   Barriers to D/C:            Co-evaluation              AM-PAC PT "6 Clicks" Daily Activity     Outcome Measure Help from another person eating meals?: A Lot Help from another person taking care of personal grooming?: A Lot Help from another person toileting, which includes using toliet, bedpan, or  urinal?: A Lot Help from another person bathing (including washing, rinsing, drying)?: A Lot Help from another person to put on and taking off regular upper body clothing?: A Lot Help from another person to put on and taking off regular lower body clothing?: A Lot 6 Click Score: 12   End of Session Equipment Utilized During Treatment: Gait belt;Rolling walker  Activity Tolerance: Patient tolerated treatment well Patient left: in chair;with call bell/phone within reach  OT Visit Diagnosis: Unsteadiness on feet (R26.81);Other abnormalities of gait and mobility (R26.89);Muscle weakness (generalized) (M62.81);Hemiplegia and hemiparesis Hemiplegia - Right/Left: Right Hemiplegia - dominant/non-dominant: Dominant Hemiplegia - caused by: Cerebral infarction                Time: 5573-2202 OT Time Calculation (min): 23 min Charges:  OT General Charges $OT Visit: 1 Visit OT Evaluation $OT Eval Moderate Complexity: 1 Mod OT Treatments $Self Care/Home Management : 8-22 mins G-Codes:     Anthony Allen A. Ulice Brilliant, M.S., OTR/L Pager: [place] 06/04/2017, 3:39 PM

## 2017-06-04 NOTE — Evaluation (Signed)
Clinical/Bedside Swallow Evaluation Patient Details  Name: Anthony Allen MRN: [id number] Date of Birth: 04/10/1936  Today's Date: 06/04/2017 Time: SLP Start Time (ACUTE ONLY): 0955 SLP Stop Time (ACUTE ONLY): 1036 SLP Time Calculation (min) (ACUTE ONLY): 41 min  Past Medical History:  Past Medical History:  Diagnosis Date  . Glaucoma   . Gout   . Hypertension   . Type 2 diabetes mellitus with hyperglycemia [facility])    Past Surgical History:  Past Surgical History:  Procedure Laterality Date  . TONSILLECTOMY     HPI:  81 y.o. male with medical history significant of hypertension, hyperlipidemia, diabetes mellitus, gout, glaucoma and newly diagnosed stroke, who presents with right-sided weakness, slurred speech, difficulty swallowing. Pt was hospitalized from 12/22-12/24 due to left-sided weakness, slurred speech and difficulty swallowing. He was diagnosed as left pontine stroke. Pt had old stroke workup including MRI/MRA, carotid artery Doppler and 2-D echo. Per discharge summery, MRI of the brain showed left-sided pontine CVA. MRA which showed some vertebral stenosis on the left side. Dual antiplatelet therapy with aspirin and Plavix were started per neurologist recommendation. Carotid duplex showed no evidence of hemodynamically significant carotid artery stenosis. 2d echo showed normal EF with no evidence of intracardiac thrombus. Patient was also seen by speech therapy and placed on a dysphagia 2 diet with nectar thick liquids. Pt was just discharged home yesterday afternoon (06/02/17). Per his wife, patient continues to have difficulty swallowing. His right sided weakness seems to have worsened.    Assessment / Plan / Recommendation Clinical Impression   Pt presents with oropharyngeal dysphagia characterized by right oral weakness/decreased coordination and impaired mastication during consumption of PO intake including puree, nectar-thickened liquids and solids; previous BSE completed  on 05/31/17 at [facility] which indicated need for Dysphagia 2 (minced)/nectar-thickened d/t overt s/s of aspiration with thin including immediate cough and pt c/o difficulty as well with thin liquid consumption; pt in agreement with modified diet; will follow while in acute setting for diet tolerance/cognitive deficits and possible objective testing prn. SLP Visit Diagnosis: Dysphagia, oropharyngeal phase (R13.12)    Aspiration Risk  Mild aspiration risk    Diet Recommendation   Dysphagia 2/nectar-thickened liquids  Medication Administration: Other (Comment)(whole with nectar-thickened liquids)and/or as tolerated    Other  Recommendations Oral Care Recommendations: Oral care BID   Follow up Recommendations Other (comment)(TBD)      Frequency and Duration min 2x/week  1 week       Prognosis Prognosis for Safe Diet Advancement: Good      Swallow Study   General Date of Onset: 05/31/17 HPI: 81 y.o. male with medical history significant of hypertension, hyperlipidemia, diabetes mellitus, gout, glaucoma and newly diagnosed stroke, who presents with right-sided weakness, slurred speech, difficulty swallowing. Type of Study: Bedside Swallow Evaluation Previous Swallow Assessment: [facility] 12/22: rec'd D2/NTL Diet Prior to this Study: NPO Temperature Spikes Noted: No Respiratory Status: Room air History of Recent Intubation: No Behavior/Cognition: Alert;Cooperative;Other (Comment)(emotional lability) Oral Cavity Assessment: (R weakness; mass on salivary gland per wife) Oral Care Completed by SLP: Recent completion by staff Oral Cavity - Dentition: Adequate natural dentition Vision: Functional for self-feeding Self-Feeding Abilities: Able to feed self;Needs assist(d/t dominant hand affected) Patient Positioning: Upright in chair Baseline Vocal Quality: Low vocal intensity Volitional Cough: Strong Volitional Swallow: Able to elicit    Oral/Motor/Sensory Function Overall Oral  Motor/Sensory Function: Mild impairment Facial ROM: Reduced right Facial Symmetry: Abnormal symmetry right Facial Strength: Reduced right Facial Sensation: Reduced right Lingual ROM: Reduced right  Lingual Symmetry: Abnormal symmetry right Lingual Strength: Reduced Lingual Sensation: Reduced   Ice Chips Ice chips: Not tested   Thin Liquid Thin Liquid: Not tested Other Comments: (Previously on NTL per [facility])    Nectar Thick Nectar Thick Liquid: Within functional limits Presentation: Cup;Self Fed Other Comments: (Pt aware of deficits; using precautions during consumption)   Honey Thick Honey Thick Liquid: Not tested   Puree Puree: Within functional limits Presentation: Self Fed Other Comments: (Utilizing precautions with puree)   Solid      Solid: Impaired Presentation: Self Fed Oral Phase Impairments: Reduced lingual movement/coordination;Impaired mastication Oral Phase Functional Implications: Prolonged oral transit;Impaired mastication        Elvina Sidle, M.S., CCC-SLP 06/04/2017,11:29 AM

## 2017-06-04 NOTE — Plan of Care (Signed)
  Health Behavior/Discharge Planning: Ability to manage health-related needs will improve 06/04/2017 2019 - Progressing by Irish Lack, RN   Clinical Measurements: Ability to maintain clinical measurements within normal limits will improve 06/04/2017 2019 - Progressing by Irish Lack, RN   Clinical Measurements: Cardiovascular complication will be avoided 06/04/2017 2019 - Progressing by Irish Lack, RN

## 2017-06-05 DIAGNOSIS — N183 Chronic kidney disease, stage 3 (moderate): Secondary | ICD-10-CM | POA: Diagnosis not present

## 2017-06-05 DIAGNOSIS — I10 Essential (primary) hypertension: Secondary | ICD-10-CM | POA: Diagnosis not present

## 2017-06-05 DIAGNOSIS — I6300 Cerebral infarction due to thrombosis of unspecified precerebral artery: Secondary | ICD-10-CM | POA: Diagnosis not present

## 2017-06-05 DIAGNOSIS — I519 Heart disease, unspecified: Secondary | ICD-10-CM | POA: Diagnosis not present

## 2017-06-05 LAB — GLUCOSE, CAPILLARY
GLUCOSE-CAPILLARY: 134 mg/dL — AB (ref 65–99)
GLUCOSE-CAPILLARY: 128 mg/dL — AB (ref 65–99)
Glucose-Capillary: 199 mg/dL — ABNORMAL HIGH (ref 65–99)

## 2017-06-05 NOTE — Progress Notes (Signed)
Patient transported to SNF by [facility] he was given Lovenox 40 mg and Ambien 5 mg will call facility and update them on these medication administrations.

## 2017-06-05 NOTE — NC FL2 (Signed)
[place] MEDICAID FL2 LEVEL OF CARE SCREENING TOOL     IDENTIFICATION  Patient Name: Anthony Allen Birthdate: 1936-02-10 Sex: male Admission Date (Current Location): 06/03/2017  [facility] and [place] Number:  Engineering geologist and Address:  [facility] [place], [place] [address], [place], [place] [address]      Provider Number: [id number]  Attending Physician Name and Address:  Debbe Odea, MD  Relative Name and Phone Number:  Amit Leece, [phone] spouse    Current Level of Care: Hospital Recommended Level of Care: [place] Prior Approval Number:    Date Approved/Denied:   PASRR Number: [id number]  Discharge Plan: SNF    Current Diagnoses: Patient Active Problem List   Diagnosis Date Noted  . Diastolic dysfunction   . Dysphagia, post-stroke   . CKD (chronic kidney disease), stage III ([employer]) 06/03/2017  . 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 Stroke (cerebrum) ([place]) 06/03/2017  . CVA (cerebral vascular accident) ([place]) 06/01/2017  . TIA (transient ischemic attack) 05/31/2017  . Fatigue 05/08/2017  . Aortic atherosclerosis ([place]) 05/08/2017  . Glaucoma 05/06/2017  . Lipoma of forehead 11/02/2016  . Type II diabetes mellitus with renal manifestations ([place]) 11/02/2016  . Hyperlipidemia LDL goal <100 11/02/2016  . Constipation in male 11/02/2016  . Insomnia due to anxiety and fear 11/02/2016  . Colon cancer screening 05/04/2015  . Vitamin D deficiency 05/02/2015  . Medicare annual wellness visit, subsequent 06/23/2013  . Essential hypertension, benign 03/22/2013  . Osteoarthritis 03/22/2013  . Numbness and tingling in left hand 03/22/2013  . GERD (gastroesophageal reflux disease) 03/22/2013  . BPH (benign prostatic hyperplasia) 03/22/2013    Orientation RESPIRATION BLADDER Height & Weight     Self, Time, Situation, Place    Continent Weight: 160 lb 4.4 oz (72.7 kg) Height:  5\' 9"  (175.3 cm)  BEHAVIORAL  SYMPTOMS/MOOD NEUROLOGICAL BOWEL NUTRITION STATUS      Continent Diet(See DC Summary)  AMBULATORY STATUS COMMUNICATION OF NEEDS Skin   Extensive Assist Verbally Normal                       Personal Care Assistance Level of Assistance  Bathing, Dressing, Feeding Bathing Assistance: Maximum assistance Feeding assistance: Limited assistance Dressing Assistance: Maximum assistance     Functional Limitations Info  Sight, Hearing, Speech Sight Info: Adequate Hearing Info: Adequate Speech Info: Impaired(slurred)    SPECIAL CARE FACTORS FREQUENCY  PT (By licensed PT), OT (By licensed OT)     PT Frequency: 5x week OT Frequency: 5x week            Contractures Contractures Info: Not present    Additional Factors Info  Code Status, Allergies, Insulin Sliding Scale Code Status Info: Full Allergies Info: No Known Allergies   Insulin Sliding Scale Info: Insulin Daily       Current Medications (06/05/2017):  This is the current hospital active medication list Current Facility-Administered Medications  Medication Dose Route Frequency Provider Last Rate Last Dose  .  stroke: mapping our early stages of recovery book   Does not apply Once Ivor Costa, MD      . 0.9 %  sodium chloride infusion   Intravenous Continuous Ivor Costa, MD 100 mL/hr at 06/05/17 0700    . acetaminophen (TYLENOL) tablet 650 mg  650 mg Oral Q4H PRN Ivor Costa, MD       Or  . acetaminophen (TYLENOL) solution 650 mg  650 mg Per Tube Q4H PRN Ivor Costa,  MD       Or  . acetaminophen (TYLENOL) suppository 650 mg  650 mg Rectal Q4H PRN Ivor Costa, MD      . aspirin suppository 150 mg  150 mg Rectal Daily Ivor Costa, MD   150 mg at 06/03/17 2350   Or  . aspirin tablet 325 mg  325 mg Oral Daily Ivor Costa, MD   325 mg at 06/05/17 6256  . aspirin-acetaminophen-caffeine (EXCEDRIN MIGRAINE) per tablet 1 tablet  1 tablet Oral Q8H PRN Ivor Costa, MD      . cholecalciferol (VITAMIN D) tablet 1,000 Units  1,000 Units  Oral Daily Ivor Costa, MD   1,000 Units at 06/05/17 [phone]  . clopidogrel (PLAVIX) tablet 75 mg  75 mg Oral Daily Ivor Costa, MD   75 mg at 06/05/17 7342  . enoxaparin (LOVENOX) injection 40 mg  40 mg Subcutaneous Q24H Ivor Costa, MD   40 mg at 06/04/17 2157  . feeding supplement (GLUCERNA SHAKE) (GLUCERNA SHAKE) liquid 237 mL  237 mL Oral TID BM Debbe Odea, MD   237 mL at 06/05/17 0929  . fluticasone (FLONASE) 50 MCG/ACT nasal spray 2 spray  2 spray Each Nare Daily PRN Ivor Costa, MD      . hydrALAZINE (APRESOLINE) injection 5 mg  5 mg Intravenous Q2H PRN Ivor Costa, MD      . hydrocortisone cream 1 % 1 application  1 application Topical PRN Ivor Costa, MD      . insulin aspart (novoLOG) injection 0-5 Units  0-5 Units Subcutaneous QHS Ivor Costa, MD      . insulin aspart (novoLOG) injection 0-9 Units  0-9 Units Subcutaneous TID WC Ivor Costa, MD   1 Units at 06/05/17 [phone]  . latanoprost (XALATAN) 0.005 % ophthalmic solution 1 drop  1 drop Both Eyes QHS Ivor Costa, MD   1 drop at 06/04/17 2211  . losartan (COZAAR) tablet 50 mg  50 mg Oral Daily Ivor Costa, MD   50 mg at 06/05/17 1157  . multivitamin (PROSIGHT) tablet 1 tablet  1 tablet Oral Daily Ivor Costa, MD   1 tablet at 06/05/17 [phone]  . ondansetron (ZOFRAN) injection 4 mg  4 mg Intravenous Q8H PRN Ivor Costa, MD      . rosuvastatin (CRESTOR) tablet 20 mg  20 mg Oral q1800 Ivor Costa, MD   20 mg at 06/04/17 1730  . senna-docusate (Senokot-S) tablet 1 tablet  1 tablet Oral QHS PRN Ivor Costa, MD      . timolol (TIMOPTIC) 0.5 % ophthalmic solution 1 drop  1 drop Both Eyes Daily Ivor Costa, MD   1 drop at 06/05/17 3559  . zolpidem (AMBIEN) tablet 5 mg  5 mg Oral QHS PRN Ivor Costa, MD   5 mg at 06/04/17 2220     Discharge Medications: Please see discharge summary for a list of discharge medications.  Relevant Imaging Results:  Relevant Lab Results:   Additional Information SS#:[id number] [place], LCSW

## 2017-06-05 NOTE — Social Work (Signed)
CSW tried to reach spouse again to provide support as RNCM spoken to family member and they had questions. CSW called spouse at cell # [phone] and unable to reach.  Spouse selected Radiation protection practitioner with private room and pt will dc to SNF today.  CSW will sign off for now. Please consult [place] again if new need arises.  Elissa Hefty, LCSW Clinical Social Worker [phone]

## 2017-06-05 NOTE — Progress Notes (Signed)
Rehab admissions - Please see rehab MD consult recommending SNF placement.  Patient not deemed to be a candidate for acute inpatient rehab admission.  Call me for questions.  #[phone]

## 2017-06-05 NOTE — Care Management Note (Signed)
Case Management Note  Patient Details  Name: Anthony Allen MRN: [id number] Date of Birth: 19-Apr-1936  Subjective/Objective:                    Action/Plan: Pt discharging to [facility] today. No further needs per CM.   Expected Discharge Date:  06/05/17               Expected Discharge Plan:  Skilled Nursing Facility  In-House Referral:  Clinical Social Work  Discharge planning Services     Post Acute Care Choice:    Choice offered to:     DME Arranged:    DME Agency:     HH Arranged:    [place] Agency:     Status of Service:  Completed, signed off  If discussed at [employer], dates discussed:    Additional Comments:  Pollie Friar, RN 06/05/2017, 3:48 PM

## 2017-06-05 NOTE — Clinical Social Work Placement (Signed)
   CLINICAL SOCIAL WORK PLACEMENT  NOTE  Date:  06/05/2017  Patient Details  Name: Anthony Allen MRN: [id number] Date of Birth: Apr 13, 1936  Clinical Social Work is seeking post-discharge placement for this patient at the [place] level of care (*CSW will initial, date and re-position this form in  chart as items are completed):  Yes   Patient/family provided with [place] Work Department's list of facilities offering this level of care within the geographic area requested by the patient (or if unable, by the patient's family).  Yes   Patient/family informed of their freedom to choose among providers that offer the needed level of care, that participate in Medicare, Medicaid or managed care program needed by the patient, have an available bed and are willing to accept the patient.  Yes   Patient/family informed of [facility]'s ownership interest in [facility] and [facility], as well as of the fact that they are under no obligation to receive care at these facilities.  PASRR submitted to EDS on       PASRR number received on 06/05/17     Existing PASRR number confirmed on       FL2 transmitted to all facilities in geographic area requested by pt/family on 06/05/17     FL2 transmitted to all facilities within larger geographic area on       Patient informed that his/her managed care company has contracts with or will negotiate with certain facilities, including the following:        Yes   Patient/family informed of bed offers received.  Patient chooses bed at [facility]     Physician recommends and patient chooses bed at      Patient to be transferred to [facility] on 06/05/17.  Patient to be transferred to facility by PTAR     Patient family notified on 06/05/17 of transfer.  Name of family member notified:  spouse at bedside     PHYSICIAN       Additional Comment:     _______________________________________________ [name], LCSW 06/05/2017, 3:41 PM

## 2017-06-05 NOTE — Social Work (Signed)
Clinical Social Worker facilitated patient discharge including contacting patient family and facility to confirm patient discharge plans.  Clinical information faxed to facility and family agreeable with plan.    CSW arranged ambulance transport via PT[facility] to [facility].    RN to call [phone] to give report prior to discharge.  Clinical Social Worker will sign off for now as social work intervention is no longer needed. Please consult [place] again if new need arises.  Elissa Hefty, LCSW Clinical Social Worker [phone]

## 2017-06-05 NOTE — Progress Notes (Signed)
Physical Therapy Treatment Patient Details Name: Anthony Allen MRN: [id number] DOB: Aug 27, 1935 Today's Date: 06/05/2017    History of Present Illness 81 y.o. male presents with slurred speech, dificulty swallowing, and right-sided weakness. Pt had been discharged from [facility] 12/24 after left pontine stroke, however progressively declining status brought him to [facility] 12/25. 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 PMH signifiacnt of HTN, DM2, HLD, and glaucoma.     PT Comments    Pt progresses towards PT goals today, ambulating 10-ft x3 with LUE support of handrail and min-assist from PT. Pt does well with VCs and follows commands. Pt educated on sit-to-stand form and is able to recall instruction within session, but did not carry over from last session. Pt expresses emotional lability throughout session, crying for only a few seconds at a time. Pt does well with LE exercises given today, but will need reminding to perform during the day. HR ranging from 85-104 bpm during session. Current discharge plan remains appropriate and PT will follow acutely in order to maximize mobility while in hospital setting.    Follow Up Recommendations  CIR;Supervision for mobility/OOB     Equipment Recommendations  Other (comment)(defer to next venue)    Recommendations for Other Services       Precautions / Restrictions Precautions Precautions: Fall Restrictions Weight Bearing Restrictions: No    Mobility  Bed Mobility Overal bed mobility: Needs Assistance Bed Mobility: Rolling;Sidelying to Sit Rolling: Supervision Sidelying to sit: HOB elevated;Min assist       General bed mobility comments: VCs for rolling to use bed railing and momentum. VCs for sidelying to sit to push from hand rail to upright. Pt requiring increased time.   Transfers Overall transfer level: Needs assistance Equipment used: None Transfers: Sit to/from [employer] Sit to Stand: Min assist Stand pivot transfers: Min assist       General transfer comment: STS x3 from chair with VCs for hand placement on RLE and to lean forward. SPT from EOB to chair with small steps to chair (blocking right knee). Pt in chair post-ambulation.   Ambulation/Gait Ambulation/Gait assistance: Mod assist Ambulation Distance (Feet): 10 Feet(+10+10) Assistive device: (hand railing on left side) Gait Pattern/deviations: Step-through pattern;Decreased step length - right;Decreased step length - left;Narrow base of support;Trunk flexed Gait velocity: decreased Gait velocity interpretation: <1.8 ft/sec, indicative of risk for recurrent falls General Gait Details: Pt with left lean and VCs/TCs to activate trunk extensors and stand upright. Pt with downwards gaze and VCs to "look up". Pt continues to demonstrate right knee instability and requires PT assist to block right knee when weight-beaaring through RLE. Pt takes larger steps with VCs.    Stairs            Wheelchair Mobility    Modified Rankin (Stroke Patients Only) Modified Rankin (Stroke Patients Only) Pre-Morbid Rankin Score: Slight disability Modified Rankin: Moderately severe disability     Balance Overall balance assessment: Needs assistance Sitting-balance support: Feet supported Sitting balance-Leahy Scale: Fair Sitting balance - Comments: Pt able to maintain sitting balance at EOB with hadns in lap. Pt reaches slightly outside base of support without LOB, but therapist guarding for safety.  Postural control: Left lateral lean Standing balance support: Single extremity supported;During functional activity Standing balance-Leahy Scale: Poor Standing balance comment: Pt reliant on external support (mainly through LUE) for standing balance.  Cognition Arousal/Alertness: Awake/alert Behavior During Therapy: WFL for tasks assessed/performed;Flat  affect Overall Cognitive Status: No family/caregiver present to determine baseline cognitive functioning                                 General Comments: Pt emotionally labile during session, bursting into tears suddenly, lasting only a few seconds. Otherwise, pt with flat affect. Pt keeps responses to a minimum, replying with short terms such as "pretty good" or "not bad".      Exercises General Exercises - Lower Extremity Ankle Circles/Pumps: AROM;Right;Seated;10 reps(x2) Long Arc Quad: AROM;5 reps;Right;Seated(x4) Hip ABduction/ADduction: AAROM;10 reps;Seated;Right Other Exercises Other Exercises: sitting EOB reaching outside BOS to upper quadrant (x3 each side) for trunk control and activation of trunk lateral flexors and extensors    General Comments General comments (skin integrity, edema, etc.): HR ranging from 85-104 bpm during session.       Pertinent Vitals/Pain Pain Assessment: No/denies pain    Home Living                      Prior Function            PT Goals (current goals can now be found in the care plan section) Progress towards PT goals: Progressing toward goals    Frequency    Min 4X/week      PT Plan Current plan remains appropriate    Co-evaluation              AM-PAC PT "6 Clicks" Daily Activity  Outcome Measure  Difficulty turning over in bed (including adjusting bedclothes, sheets and blankets)?: A Little Difficulty moving from lying on back to sitting on the side of the bed? : Unable Difficulty sitting down on and standing up from a chair with arms (e.g., wheelchair, bedside commode, etc,.)?: Unable Help needed moving to and from a bed to chair (including a wheelchair)?: A Little Help needed walking in hospital room?: A Little Help needed climbing 3-5 steps with a railing? : Total 6 Click Score: 12    End of Session Equipment Utilized During Treatment: Gait belt Activity Tolerance: Patient tolerated  treatment well Patient left: in chair;with call bell/phone within reach   PT Visit Diagnosis: Difficulty in walking, not elsewhere classified (R26.2);Unsteadiness on feet (R26.81);Hemiplegia and hemiparesis Hemiplegia - Right/Left: Right Hemiplegia - dominant/non-dominant: Dominant Hemiplegia - caused by: Cerebral infarction     Time: 3704-8889 PT Time Calculation (min) (ACUTE ONLY): 33 min  Charges:  $Gait Training: 8-22 mins $Neuromuscular Re-education: 8-22 mins                    G Codes:       Judee Clara, SPT   Judee Clara 06/05/2017, 10:59 AM

## 2017-06-05 NOTE — Progress Notes (Signed)
NIH up from 4 last night to 7. Mostly right arm weakness. Patient and family stated the weakness is fluctuating. MD notified

## 2017-06-05 NOTE — Clinical Social Work Note (Signed)
Clinical Social Work Assessment  Patient Details  Name: Anthony Allen MRN: [id number] Date of Birth: 06-16-1935  Date of referral:  06/05/17               Reason for consult:  Facility Placement                Permission sought to share information with:  Case Manager Permission granted to share information::  Yes, Release of Information Signed  Name::     Archivist::  SNF  Relationship::     Contact Information:     Housing/Transportation Living arrangements for the past 2 months:  Single Family Home Source of Information:  Spouse Patient Interpreter Needed:  None Criminal Activity/Legal Involvement Pertinent to Current Situation/Hospitalization:  No - Comment as needed Significant Relationships:  Spouse Lives with:  Spouse Do you feel safe going back to the place where you live?  No Need for family participation in patient care:  Yes (Comment)  Care giving concerns:  Pt resides with spouse. Pt was independent prior to impairment. He cannot be cared for by spouse and will need short term rehab. CSW will assist with SNF placement.  CSW explained SNF process to patient/spouse. CSW will discuss SNf offers. Patient/family prefers [facility] as it is close to home.  Social Worker assessment / plan:  CSW will f/u for SNF offers and assist with transition to SNF today.  Employment status:  Retired Nurse, adult PT Recommendations:  [place] / Referral to community resources:  [place]  Patient/Family's Response to care:  Family appreciative of CSW assistance with SNF Placement and voiced appreciation for CSW f/u on the SNF placement.  Patient/Family's Understanding of and Emotional Response to Diagnosis, Current Treatment, and Prognosis:  Patient/family understands that they cannot care for patient at home given new impairment. Spouse and patient are amenable and hopeful that rehab will help patient  improve. No issues or concerns identified.  Emotional Assessment Appearance:  Appears stated age Attitude/Demeanor/Rapport:  (Cooperative) Affect (typically observed):  Accepting, Appropriate Orientation:  Oriented to Self, Oriented to Situation, Oriented to Place, Oriented to  Time Alcohol / Substance use:  Not Applicable Psych involvement (Current and /or in the community):  No (Comment)  Discharge Needs  Concerns to be addressed:  Discharge Planning Concerns Readmission within the last 30 days:  No Current discharge risk:  Physical Impairment, Dependent with Mobility Barriers to Discharge:  No Barriers Identified   Anthony Baxter, LCSW 06/05/2017, 3:35 PM

## 2017-06-05 NOTE — Progress Notes (Signed)
Pt discharge education and instructions completed. Pt discharge to [facility] and report called off to nurse Early Osmond at the facility. Pt IV and telemetry removed; pt awaiting on PTAR to pick up and transport off to disposition. Delia Heady RN

## 2017-06-06 DIAGNOSIS — I779 Disorder of arteries and arterioles, unspecified: Secondary | ICD-10-CM | POA: Insufficient documentation

## 2017-06-06 DIAGNOSIS — G8191 Hemiplegia, unspecified affecting right dominant side: Secondary | ICD-10-CM | POA: Insufficient documentation

## 2017-06-06 DIAGNOSIS — D582 Other hemoglobinopathies: Secondary | ICD-10-CM | POA: Insufficient documentation

## 2017-06-13 LAB — JAK2 EXONS 12-15

## 2017-06-13 LAB — JAK2  V617F QUAL. WITH REFLEX TO EXON 12

## 2017-06-16 ENCOUNTER — Other Ambulatory Visit: Payer: Self-pay | Admitting: Internal Medicine

## 2017-06-16 DIAGNOSIS — D490 Neoplasm of unspecified behavior of digestive system: Secondary | ICD-10-CM

## 2017-06-17 ENCOUNTER — Telehealth: Payer: Self-pay | Admitting: Internal Medicine

## 2017-06-17 NOTE — Telephone Encounter (Signed)
Copied from [place] [id number]. Topic: Quick Communication - Office Called Patient >> Jun 17, 2017  9:05 AM Micheline Maze B wrote: Reason for CRM: lvm for pt wife to rtc. Dr. Derrel Nip wanted Celesta Aver appointment to be moved up at [facility]. He has been rescheduled for tomorrow Jan 9 at 1:30 pm with Dr. Erlinda Hong. [facility] has permission to give appointment info or can send to me. [facility] number to reschedule [phone]

## 2017-06-18 ENCOUNTER — Telehealth: Payer: Self-pay | Admitting: Internal Medicine

## 2017-06-18 ENCOUNTER — Ambulatory Visit: Payer: Self-pay | Admitting: Neurology

## 2017-06-18 NOTE — Telephone Encounter (Signed)
error 

## 2017-06-18 NOTE — Telephone Encounter (Signed)
Copied from [place] [id number]. Topic: Quick Communication - Office Called Patient >> Jun 17, 2017  9:05 AM Micheline Maze B wrote: Reason for CRM: lvm for pt wife to rtc. Dr. Derrel Nip wanted Celesta Aver appointment to be moved up at [facility]. He has been rescheduled for tomorrow Jan 9 at 1:30 pm with Dr. Erlinda Hong. [facility] has permission to give appointment info or can send to me. [facility] number to reschedule [phone] >> Jun 18, 2017  8:00 AM Micheline Maze B wrote: Left 2 vm's on 1/8 on patients phone to rtc regarding his appt being moved to 1/9. No return call. Left vm this morning 1/9 with appt information that has been changed. He is not active on mychart so cannot send message through.

## 2017-06-19 ENCOUNTER — Encounter: Payer: Self-pay | Admitting: Neurology

## 2017-06-23 ENCOUNTER — Telehealth: Payer: Self-pay

## 2017-06-23 ENCOUNTER — Telehealth: Payer: Self-pay | Admitting: Internal Medicine

## 2017-06-23 NOTE — Telephone Encounter (Signed)
Copied from [place] [phone]. Topic: Referral - Question >> Jun 23, 2017  1:08 PM Cecelia Byars, NT wrote: Reason for CRM: Patients wife would like referral information  to the ENT  ,please text her the number

## 2017-06-23 NOTE — Telephone Encounter (Signed)
Copied from [place] [id number]. Topic: Referral - Question >> Jun 23, 2017  1:08 PM Cecelia Byars, NT wrote: Reason for CRM: Patients wife would like referral information  to the ENT  ,please text her the number

## 2017-06-23 NOTE — Telephone Encounter (Signed)
Please advise 

## 2017-06-29 NOTE — Telephone Encounter (Signed)
Patient was discharged to [facility] for rehab  after readmission for fall at home post cva. All correspondence has been via wife [place] via cellphone

## 2017-07-01 NOTE — Progress Notes (Signed)
Late entry for missed G-code. Based on review of the evaluation and goals by Wray Kearns, PT.    Jun 19, 2017 1042  PT G-Codes **NOT FOR INPATIENT CLASS**  Functional Assessment Tool Used AM-PAC 6 Clicks Basic Mobility  Functional Limitation Mobility: Walking and moving around  Mobility: Walking and Moving Around Current Status (H6314) CL  Mobility: Walking and Moving Around Goal Status [phone]) Collier Bullock, PT  [phone] 07/01/2017

## 2017-07-02 NOTE — Progress Notes (Signed)
Late entry for missed gcodes    06/04/17 1530  OT Time Calculation  OT Start Time (ACUTE ONLY) 1444  OT Stop Time (ACUTE ONLY) 1507  OT Time Calculation (min) 23 min  OT G-codes **NOT FOR INPATIENT CLASS**  Functional Assessment Tool Used Clinical judgement  Functional Limitation Self care  Self Care Current Status (I9678) CL  Self Care Goal Status (L3810) CJ  OT General Charges  $OT Visit 1 Visit  OT Evaluation  $OT Eval Moderate Complexity 1 Mod  OT Treatments  $Self Care/Home Management  8-22 mins   Loran Fleet A. Ulice Brilliant, M.S., OTR/L Pager: [phone]

## 2017-07-02 NOTE — Progress Notes (Signed)
06/04/17 1100  SLP Visit Information  SLP Received On 06/04/17  Subjective  Patient/Family Stated Goal eat/drink safely  General Information  Date of Onset 05/31/17  HPI 82 y.o. male with medical history significant of hypertension, hyperlipidemia, diabetes mellitus, gout, glaucoma and newly diagnosed stroke, who presents with right-sided weakness, slurred speech, difficulty swallowing.  Type of Study Bedside Swallow Evaluation  Previous Swallow Assessment [facility] 12/22: rec'd D2/NTL  Diet Prior to this Study NPO  Temperature Spikes Noted No  Respiratory Status Room air  History of Recent Intubation No  Behavior/Cognition Alert;Cooperative;Other (Comment) (emotional lability)  Oral Cavity Assessment (R weakness; mass on salivary gland per wife)  Oral Care Completed by SLP Recent completion by staff  Oral Cavity - Dentition Adequate natural dentition  Vision Functional for self-feeding  Self-Feeding Abilities Able to feed self;Needs assist (d/t dominant hand affected)  Patient Positioning Upright in chair  Baseline Vocal Quality Low vocal intensity  Volitional Cough Strong  Volitional Swallow Able to elicit  Pain Assessment  Pain Assessment No/denies pain  Oral Assessment (Complete on admission/transfer/change in patient condition)  Does patient have any of the following "high risk" factors? Saliva - insufficient, absent  Does patient have any of the following "at risk" factors? Other - dysphagia  Patient is HIGH RISK: non-ventilated Order set for Adult Oral Care Standing Orders initiated - "High Risk Patients - Non-Ventilated" option selected  (see row information)  Oral Motor/Sensory Function  Overall Oral Motor/Sensory Function Mild impairment  Facial ROM Reduced right  Facial Symmetry Abnormal symmetry right  Facial Strength Reduced right  Facial Sensation Reduced right  Lingual ROM Reduced right  Lingual Symmetry Abnormal symmetry right  Lingual Strength Reduced   Lingual Sensation Reduced  Ice Chips  Ice chips NT  Thin Liquid  Thin Liquid NT  Other Comments (Previously on NTL per [facility])  Nectar Thick Liquid  Nectar Thick Liquid WFL  Presentation Cup;Self Fed  Other Comments (Pt aware of deficits; using precautions during consumption)  Honey Thick Liquid  Honey Thick Liquid NT  Puree  Puree WFL  Presentation Self Fed  Other Comments (Utilizing precautions with puree)  Solid  Solid Impaired  Presentation Self Fed  Oral Phase Impairments Reduced lingual movement/coordination;Impaired mastication  Oral Phase Functional Implications Prolonged oral transit;Impaired mastication  SLP - End of Session  Patient left in chair;with call bell/phone within reach;with family/visitor present  Nurse Communication Aspiration precautions reviewed;Cognitive/Linguistic strategies reviewed;Diet recommendation;Treatment plan  SLP Assessment  SLP Visit Diagnosis Dysphagia, oropharyngeal phase (R13.12);Dysarthria and anarthria (R47.1);Attention and concentration deficit  Attention and concentration deficit following Cerebral infarction  Impact on safety and function Mild aspiration risk  Other Related Risk Factors History of dysphagia;Cognitive impairment  Swallow Evaluation Recommendations  Medication Administration Other (Comment) (whole with nectar-thickened liquids)  Treatment Plan  Oral Care Recommendations Oral care BID  Treatment Recommendations Therapy as outlined in treatment plan below  Follow up Recommendations Other (comment) (TBD)  Speech Therapy Frequency (ACUTE ONLY) min 2x/week  Treatment Duration 1 week  Interventions Aspiration precaution training;Compensatory techniques;Patient/family education;Trials of upgraded texture/liquids;Diet toleration management by SLP  Prognosis  Prognosis for Safe Diet Advancement Good  Individuals Consulted  Consulted and Agree with Results and Recommendations Patient;Family member/caregiver;RN  Family  Member Consulted wife  Progression Toward Goals  Progression toward goals Progressing toward goals  Potential to Achieve Goals (ACUTE ONLY) Good  SLP Time Calculation  SLP Start Time (ACUTE ONLY) 0955  SLP Stop Time (ACUTE ONLY) 1036  SLP Time Calculation (min) (  ACUTE ONLY) 41 min  SLP G-Codes **NOT FOR INPATIENT CLASS**  Functional Assessment Tool Used skilled clinical judgement via chart review  Functional Limitations Swallowing  Swallow Current Status (Z9728) CK  Swallow Goal Status (A0601) CJ  SLP Evaluations  $ SLP Speech Visit 1 Visit  SLP Evaluations  $BSS Swallow 1 Procedure  $ SLP EVAL LANGUAGE/SOUND PRODUCTION 1 Procedure  Late entry complete for original evaluation complete on 05/16/17 by Elvina Sidle, SLP.   [place], [place] [phone]

## 2017-07-08 ENCOUNTER — Telehealth: Payer: Self-pay | Admitting: Internal Medicine

## 2017-07-08 NOTE — Telephone Encounter (Unsigned)
Copied from [place]. Topic: Quick Communication - See Telephone Encounter >> Jul 08, 2017  2:42 PM Hewitt Shorts wrote: CRM for notification. See Telephone encounter for: pt wife is calling to request a handicap form be completed for her husband since his stroke  Best number [phone] please call when ready to pick up form   07/08/17.

## 2017-07-09 NOTE — Telephone Encounter (Signed)
LMTCB. Need to let pt or pt's wife know that the handicap form has been completed and placed up front for them to come by and pick up.

## 2017-07-09 NOTE — Telephone Encounter (Signed)
Signed and in red floder

## 2017-07-09 NOTE — Telephone Encounter (Signed)
Form has been red folder.

## 2017-07-10 ENCOUNTER — Ambulatory Visit: Payer: Medicare Other | Admitting: Internal Medicine

## 2017-07-10 ENCOUNTER — Telehealth: Payer: Self-pay | Admitting: Internal Medicine

## 2017-07-10 NOTE — Telephone Encounter (Signed)
Patient missed appt today bc someone either in the office or at [facility] told his wife he did not have one.  He is still Anthony Allen at [facility] from [facility].  Expected to be there for  1-2 more weeks.  Can you call [place] to facilitate  notification of discharge for  hospital follow up?  Should not be charged a no show fee

## 2017-07-14 ENCOUNTER — Telehealth: Payer: Self-pay | Admitting: Internal Medicine

## 2017-07-14 NOTE — Telephone Encounter (Signed)
Yes as of last week Dr. Derrel Nip was able to get in touch with the wife and she stated that the pt was still in rehab.

## 2017-07-14 NOTE — Telephone Encounter (Signed)
Patient at [facility].

## 2017-07-14 NOTE — Telephone Encounter (Signed)
Tried to reach patient or patient DPR no answer patient is probably still [facility], will verify.

## 2017-07-14 NOTE — Telephone Encounter (Signed)
Copied from [place] [id number]. Topic: Quick Communication - See Telephone Encounter >> Jul 14, 2017  4:22 PM Percell Belt A wrote: CRM for notification. See Telephone encounter for:  Anthony Allen [phone] ext [phone] Dr Tami Ribas called in and stated that pt is need surgical clearance form filled out.  Will needs appt or will the form beable to be filled out without appt?    07/14/17.

## 2017-07-15 NOTE — Telephone Encounter (Signed)
Kathlee Nations called regards to surgical clearance , went to ask CMA if ready be faxed clearance ready and signed and faxed back to ENT.

## 2017-07-17 ENCOUNTER — Other Ambulatory Visit: Payer: Self-pay | Admitting: Unknown Physician Specialty

## 2017-07-17 DIAGNOSIS — D164 Benign neoplasm of bones of skull and face: Secondary | ICD-10-CM

## 2017-07-21 ENCOUNTER — Ambulatory Visit: Payer: Medicare Other | Admitting: Internal Medicine

## 2017-07-23 ENCOUNTER — Other Ambulatory Visit: Payer: Self-pay | Admitting: Radiology

## 2017-07-23 ENCOUNTER — Other Ambulatory Visit: Payer: Self-pay | Admitting: General Surgery

## 2017-07-24 ENCOUNTER — Ambulatory Visit
Admission: RE | Admit: 2017-07-24 | Discharge: 2017-07-24 | Disposition: A | Discharge status: Home / Self Care | Payer: Medicare Other | Source: Ambulatory Visit | Attending: Unknown Physician Specialty | Admitting: Unknown Physician Specialty

## 2017-07-24 ENCOUNTER — Other Ambulatory Visit: Payer: Self-pay | Admitting: Unknown Physician Specialty

## 2017-07-24 DIAGNOSIS — D164 Benign neoplasm of bones of skull and face: Secondary | ICD-10-CM

## 2017-07-24 DIAGNOSIS — K116 Mucocele of salivary gland: Secondary | ICD-10-CM | POA: Diagnosis present

## 2017-07-24 MED ORDER — SODIUM CHLORIDE 0.9 % IV SOLN: INTRAVENOUS | Status: DC

## 2017-07-24 NOTE — Progress Notes (Signed)
Per radiologist's request, pt taken directly to [place] upon arrival, [place] tech did preliminary scan of biopsy site and to have radiologist come to bedside to take a look.  [place] tech request pt remain in [place] so radiologist can assess pt.  Pt's family in waiting area, updated by this nurse.

## 2017-07-25 ENCOUNTER — Telehealth: Payer: Self-pay | Admitting: Internal Medicine

## 2017-07-25 NOTE — Telephone Encounter (Signed)
Copied from [place] [id number]. Topic: Quick Communication - See Telephone Encounter >> Jul 25, 2017  4:01 PM Bea Graff, NT wrote: CRM for notification. See Telephone encounter for: Remo Lipps with [facility] calling to get verbal orders for OT 1 time a week for 1 week and 2 times per week for 7 weeks. CB#: [phone]  07/25/17.

## 2017-07-28 NOTE — Telephone Encounter (Signed)
Copied from [place]. Topic: General - Other >> Jul 28, 2017 11:56 AM Valla Leaver wrote: Reason for CRM: Flonnie Hailstone, OT w/ [facility] requesting order for Occu therapy 1x a wk for 1wk and 2x a wk for 7wks.

## 2017-07-28 NOTE — Telephone Encounter (Signed)
FYI verbal given for OT.

## 2017-07-30 ENCOUNTER — Other Ambulatory Visit: Payer: Self-pay | Admitting: Unknown Physician Specialty

## 2017-07-30 DIAGNOSIS — K116 Mucocele of salivary gland: Secondary | ICD-10-CM

## 2017-08-05 ENCOUNTER — Ambulatory Visit: Payer: Medicare Other | Admitting: Neurology

## 2017-08-05 ENCOUNTER — Encounter: Payer: Self-pay | Admitting: Neurology

## 2017-08-05 VITALS — BP 112/63 | HR 89 | Ht 69.0 in

## 2017-08-05 DIAGNOSIS — G8191 Hemiplegia, unspecified affecting right dominant side: Secondary | ICD-10-CM | POA: Diagnosis not present

## 2017-08-05 DIAGNOSIS — E1129 Type 2 diabetes mellitus with other diabetic kidney complication: Secondary | ICD-10-CM | POA: Diagnosis not present

## 2017-08-05 DIAGNOSIS — I6350 Cerebral infarction due to unspecified occlusion or stenosis of unspecified cerebral artery: Secondary | ICD-10-CM | POA: Diagnosis not present

## 2017-08-05 DIAGNOSIS — I10 Essential (primary) hypertension: Secondary | ICD-10-CM

## 2017-08-05 DIAGNOSIS — E785 Hyperlipidemia, unspecified: Secondary | ICD-10-CM | POA: Diagnosis not present

## 2017-08-05 DIAGNOSIS — I639 Cerebral infarction, unspecified: Secondary | ICD-10-CM

## 2017-08-05 MED ORDER — ASPIRIN EC 81 MG PO TBEC: 81.0000 mg | DELAYED_RELEASE_TABLET | Freq: Every day | ORAL | Status: DC

## 2017-08-05 NOTE — Patient Instructions (Signed)
I had a long d/w patient about his recent stroke, risk for recurrent stroke/TIAs, personally independently reviewed imaging studies and stroke evaluation results and answered questions.Continue aspirin 81 mg daily and clopidogrel 75 mg daily  for secondary stroke prevention. Stop aspirin 81mg  after 1 month (09/02/17) and stay only on plavix. Maintain strict control of hypertension with blood pressure goal below 130/90, diabetes with hemoglobin A1c goal below 6.5% and lipids with LDL cholesterol goal below 70 mg/dL. I also advised the patient to eat a healthy diet with plenty of whole grains, cereals, fruits and vegetables, exercise regularly and maintain ideal body weight Followup in the future Aaliah Jorgenson, NP in 3 months  -stop aspirin 81mg  after 1 month and stay on Plavix alone (09/02/17)  -continue to work with therapies. Outpatient referral ordered for when home therapies are completed as this is benefit you in gaining back your strength, mobility and speaking  -continue to monitor cholesterol levels and diabetes level with assistance of your PCP  -follow up in 3 months or call earlier if needed

## 2017-08-05 NOTE — Progress Notes (Signed)
[facility] [address] [place]. [place] [address] [phone]       OFFICE FOLLOW UP NOTE  [name] Date of Birth:  1935/08/27 Medical Record Number:  [id number]   Reason for Referral:  Hospital stroke follow up  CHIEF COMPLAINT:  Chief Complaint  Patient presents with  . Follow-up    Hospital Stroke follow up, Pt saw Dr Anthony Allen in the hospital. Pt is at home now. He was in Radiation protection practitioner. He is getting PT, OT and ST    HPI: Anthony Allen  is being seen today for initial office visit for pontine stroke on 05/31/17. History obtained from patient, wife, friend and chart review. Reviewed all radiology images and labs personally. Anthony Allen is a 82 year old who was seen at [facility] for pontine stroke on 05/31/2017 with right sided weakness, slurred speech and difficulty swollowing.  He was doing well there and discharged in stable condition.  He was started on Plavix due to posterior circulation disease but had not filled the Plavix at discharge.  He comes into with waxing/waning right-sided weakness and significant worsening since [facility].  Prior to being discharged from [facility], he was able to ambulate with minimal assistance, but needed 3 people to get him in the car to his house after discharge.  The morning he came in to [facility], he was unable to lift his right arm at all.  TPA was not administered due to being outside of window.  CT scan reviewed and was negative for hemorrhage.  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 2D echo showed EF of 65-70% and negative for PFO.  Bilateral carotid ultrasound showed mild amount of calcified plaque at the right carotid bulb and proximal right ICA, minimal plaque at the level of  the left carotid bulb and left ICA origin, and estimated bilateral ICA stenosis less than 50%.  It was recommended at discharge the patient continue aspirin 81 mg along with Plavix 75 mg daily along with Crestor 20 mg daily.  Patient was discharged to a skilled nursing facility at [facility].  Since discharge, patient has been improving as far as speech and right-sided weakness and return home approximately 2 weeks ago.  Patient is accompanied today by wife and friend.  Patient states that he was not given aspirin while he was in the nursing home,  He only received Plavix in which he denied increased bleeding or bruising. Patient is continuing to utilize home PT, OT, and ST 2-3 times per week.  Patient's blood pressure has been controlled and is satisfactory today at 112/63.  Patient continues to take Crestor and denies side effects of myalgias.  Wife continues to stay home with patient but does higher CNA to assist with some ADLs such as bathing and dressing.  Patient does say he continues to have some right-sided weakness but this has improved and he is getting great benefit out of therapies.  Patient also states that he is able to ambulate short distance with his rolling walker.  Patient denies new or worsening TIA/stroke symptoms.  ROS:   14 system review of systems performed and negative with exception of eye itching, runny nose, urgency, aching muscles, speech difficulty and weakness  PMH:  Past Medical History:  Diagnosis Date  .  Arthritis    "mild in my hands" (06/04/2017)  . Complication of anesthesia    "was told never to use Propofol because of parent's adverse reaction" (06/04/2017)  . CVA (cerebral vascular accident) ([place])    hospitalized from 12/22-12/24 due to left-sided weakness, slurred speech and difficulty swallowing. He was diagnosed as left pontine stroke./notes 06/03/2017  . Family history of adverse reaction to anesthesia    "mother and dad had allergic reaction Propofol"  (06/04/2017)  . Glaucoma, both eyes   . Gout   . Hypertension   . Migraine    "stopped when my wife quit smoking in ~ 1980" (06/04/2017)  . Parotid mass   . Type 2 diabetes mellitus with hyperglycemia (HCC)     PSH:  Past Surgical History:  Procedure Laterality Date  . TONSILLECTOMY  1943    Social History:  Social History   Socioeconomic History  . Marital status: Married    Spouse name: Not on file  . Number of children: Not on file  . Years of education: Not on file  . Highest education level: Not on file  Social Needs  . Financial resource strain: Not on file  . Food insecurity - worry: Not on file  . Food insecurity - inability: Not on file  . Transportation needs - medical: Not on file  . Transportation needs - non-medical: Not on file  Occupational History  . Not on file  Tobacco Use  . Smoking status: Never Smoker  . Smokeless tobacco: Never Used  Substance and Sexual Activity  . Alcohol use: Yes    Comment: 06/04/2017 "might have 1 drink q 2 wk; if that"  . Drug use: No  . Sexual activity: Not Currently  Other Topics Concern  . Not on file  Social History Narrative  . Not on file    Family History:  Family History  Problem Relation Age of Onset  . Arthritis Mother   . Stroke Father   . Hypertension Father   . Heart disease Father 75       AMI  . Kidney disease Father        bladder ca congenital loss of kidney  . Arthritis Maternal Grandmother   . Early death Daughter 16       aspiration  . Cancer Brother        Scalp    Medications:   Current Outpatient Medications on File Prior to Visit  Medication Sig Dispense Refill  . ALPRAZolam (XANAX) 0.5 MG tablet TK 1 T PO Q NIGHT SHIFT FOR ANXIETY / INSOMNIA  0  . Cholecalciferol (VITAMIN D-3) 1000 units CAPS Take 1,000 Units by mouth daily.    . clopidogrel (PLAVIX) 75 MG tablet Take 1 tablet (75 mg total) by mouth daily. 30 tablet 0  . empagliflozin (JARDIANCE) 10 MG TABS tablet Take 10 mg by  mouth daily. 30 tablet 3  . fluticasone (FLONASE) 50 MCG/ACT nasal spray Place 2 sprays into both nostrils daily as needed for allergies or rhinitis.     Marland Kitchen glucose blood test strip Use as instructed 100 each 5  . HYDROCORTISONE, TOPICAL, 2 % LOTN Apply 1 application topically as needed (itching).    . Lancets (ONETOUCH ULTRASOFT) lancets Use as instructed 100 each 5  . latanoprost (XALATAN) 0.005 % ophthalmic solution Instill 1 drop into both eyes in the evening  0  . losartan (COZAAR) 50 MG tablet Take 1 tablet (50 mg total) by mouth daily. 90 tablet 3  .  rosuvastatin (CRESTOR) 20 MG tablet Take 1 tablet (20 mg total) by mouth daily. 30 tablet 1  . timolol (TIMOPTIC) 0.5 % ophthalmic solution Instill 1 drop into the right eye in the morning  0   No current facility-administered medications on file prior to visit.     Allergies:  No Known Allergies  Physical Exam  Vitals:   08/05/17 0934  BP: 112/63  Pulse: 89  Height: 5\' 9"  (1.753 m)   Body mass index is 23.67 kg/m. No exam data present  General: well developed, elderly Caucasian male, well nourished, seated, in no evident distress Head: Frontal nodule present along with right mandible nodule Neck: supple with no carotid or supraclavicular bruits Cardiovascular: regular rate and rhythm, no murmurs Musculoskeletal: no deformity Skin:  no rash/petichiae Vascular:  Normal pulses all extremities  Neurologic Exam Mental Status: Awake and fully alert. Oriented to place and time. Recent and remote memory intact. Attention span, concentration and fund of knowledge appropriate. Mood and affect appropriate.  Cranial Nerves: Fundoscopic exam reveals sharp disc margins. Pupils equal, briskly reactive to light. Extraocular movements full with dysmetria present. Visual fields full to confrontation. Hearing intact. Facial sensation intact. Face, tongue, palate moves normally and symmetrically.  Motor: Tone increased in right upper extremity.   Right hand grasp weak, unable to bend fingers.  4 out of 5 biceps strength in right arm.  2 out of 5 triceps strength in right arm.  Slight weakness in shoulder abductor and right arm.  Right foot exhibits mild dorsiflexion weakness.  Right leg 3 out of 5.  Right hip flexor mildly weak. Sensory.: intact to touch , pinprick , position and vibratory sensation.  Coordination: Decreased rapid alternating movements in right upper extremity.  Finger-to-nose and heel-to-shin performed accurately in left upper and lower extremity.  Unable to test right side due to weakness. Gait and Station: Arises from chair with assistance.  Exhibits slow shuffling gait.. Able to heel totoe without difficulty.  Reflexes: 2+ and symmetric. Toes downgoing.    NIHSS 5 Modified Rankin  4   Diagnostic Data (Labs, Imaging, Testing)  Ct Head Wo Contrast Result Date: 06/03/2017 IMPRESSION: Mild chronic ischemic white matter disease. Possible low density seen in left pons which may correspond to infarction seen on prior MRI. No other abnormality is seen.  Ct Angio Head and Neck W Or Wo Contrast Result Date: 06/03/2017 IMPRESSION: 1. No acute intracranial finding when compared to brain MRI and MRA 2 days prior. 2. A known acute left pontine infarct is subtle by CT. 3. 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 Notable irregular plaque at the proximal left subclavian. 4. 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 5. Known left parapharyngeal and deep parotid mass/neoplasm. ENT referral has been recommended previously. 6. Alveolar ridge and body erosion of the left mandible, recommend mucosal exam to exclude underlying destructive lesion.  Echocardiogram: 06/01/2017 Study Conclusions  Left ventricle: The cavity size was normal. Wall thickness was normal. Systolic function was vigorous. The estimated ejection fraction was in the range of 65% to 70%. Wall motion was normal; there were no regional wall  motion abnormalities. Doppler parameters are consistent with abnormal left ventricular relaxation (grade 1 diastolic dysfunction). - Mitral valve: Valve area by pressure half-time: 2.27 cm^2. Impressions: - Normal Overall LVF Normal Wall Motion EF=65% Normal Right side. Normal study.  B/L Carotid U/S:   IMPRESSION: Mild amount of calcified plaque at the level of the right carotid bulb and proximal right ICA. Minimal  plaque at the level of the left carotid bulb and left ICA origin. Estimated bilateral ICA stenoses are less than 50%.     ASSESSMENT: 82 y.o. year old male here with left pontine stroke at outside hospital on 05/31/2017 and presented to [facility] with increased right-sided weakness but negative for new stroke on 06/01/2017 likely recurrence from recent left pontine lacunar stroke due to small vessel disease but he also has concurrent intracranial atherosclerosis.  Vascular risk factors include diabetes mellitus, hyperlipidemia , intracranial atherosclerosis and hypertension.    PLAN: I had a long d/w patient about his recent stroke, risk for recurrent stroke/TIAs, personally independently reviewed imaging studies and stroke evaluation results and answered questions.Continue aspirin 81 mg daily and clopidogrel 75 mg daily  for secondary stroke prevention. Stop aspirin 81mg  after 1 month (09/02/17) and stay only on plavix. Maintain strict control of hypertension with blood pressure goal below 130/90, diabetes with hemoglobin A1c goal below 6.5% and lipids with LDL cholesterol goal below 70 mg/dL. I also advised the patient to eat a healthy diet with plenty of whole grains, cereals, fruits and vegetables, exercise regularly and maintain ideal body weight Followup in the future Rolland Steinert, NP in 3 months  -continue plavix and start aspirin 81 mg for next month (until 09/02/16) and then stop aspirin and continue plavix only  -continue to work with therapies - outpatient  PT,OT,ST ordered   Orders Placed This Encounter  Procedures  . Ambulatory referral to Physical Therapy  . Ambulatory referral to Occupational Therapy  . Ambulatory referral to Speech Therapy   Meds ordered this encounter  Medications  . aspirin EC 81 MG tablet    Sig: Take 1 tablet (81 mg total) by mouth daily.    Order Specific Question:   Supervising Provider    Answer:   Garvin Fila [[id number]]   Return in about 3 months (around 11/02/2017).  Greater than 50% of time during this 25  minute visit was spent on counseling,explanation of diagnosis of lacunar stroke and intracranial atherosclerosis, planning of further management, discussion with patient and family and coordination of care.   Antony Contras, MD  [facility] [address] [place], [place] [address]  Phone [phone] Fax [phone]

## 2017-08-06 ENCOUNTER — Ambulatory Visit: Payer: Self-pay | Admitting: Neurology

## 2017-08-11 ENCOUNTER — Ambulatory Visit: Payer: Medicare Other | Admitting: Internal Medicine

## 2017-08-11 ENCOUNTER — Encounter: Payer: Self-pay | Admitting: Internal Medicine

## 2017-08-11 VITALS — BP 112/66 | HR 75 | Temp 97.4°F | Resp 15 | Ht 69.0 in | Wt 143.0 lb

## 2017-08-11 DIAGNOSIS — E559 Vitamin D deficiency, unspecified: Secondary | ICD-10-CM

## 2017-08-11 DIAGNOSIS — I10 Essential (primary) hypertension: Secondary | ICD-10-CM | POA: Diagnosis not present

## 2017-08-11 DIAGNOSIS — N183 Chronic kidney disease, stage 3 unspecified: Secondary | ICD-10-CM

## 2017-08-11 DIAGNOSIS — I63212 Cerebral infarction due to unspecified occlusion or stenosis of left vertebral arteries: Secondary | ICD-10-CM

## 2017-08-11 DIAGNOSIS — E1129 Type 2 diabetes mellitus with other diabetic kidney complication: Secondary | ICD-10-CM

## 2017-08-11 DIAGNOSIS — E785 Hyperlipidemia, unspecified: Secondary | ICD-10-CM | POA: Diagnosis not present

## 2017-08-11 MED ORDER — NYSTATIN 100000 UNIT/GM EX POWD: Freq: Two times a day (BID) | CUTANEOUS | 2 refills | Status: DC

## 2017-08-11 NOTE — Progress Notes (Signed)
Subjective:  Patient ID: Anthony Allen, male    DOB: March 22, 1936  Age: 82 y.o. MRN: [id number]  CC: The primary encounter diagnosis was Essential hypertension, benign. Diagnoses of Vitamin D deficiency, Type 2 diabetes mellitus with other diabetic kidney complication, without long-term current use of insulin ([place]), Hyperlipidemia LDL goal <100, Cerebrovascular accident (CVA) due to occlusion of left vertebral artery ([place]), and CKD (chronic kidney disease), stage III ([place]) were also pertinent to this visit.  HPI Anthony Allen presents for follow up on hospitalization in December for acute left pontine lacunar infarct resulting in right sided weakness,  Slurred speech, difficulty swallowing, and emotional lability.  Discharged to rehab  .  Now on asa, plavix and Crestor .  Has been Home 3 weeks from [facility]:  Getting ST,  OT and PT each 3/week . Showers once a week via paid caregiver.  Another helper. Weight loss of 20 lbs   Drinking 2 to 3 atkins shakes daily   No longer on thickened liquids.  Regular diet planned  Has a hospital bed transfers from wheelchair to bed with a little help  Son has bipolar disorder.  Family friend taking him to appointments weekly to  mental health .   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  radiologist felt it was a cyst and that no biopsy planned ,  Repeat  US May   bp well controlled  using miralax prn  To manage bowels   Lab Results  Component Value Date   HGBA1C 6.6 (H) 06/01/2017       Outpatient Medications Prior to Visit  Medication Sig Dispense Refill  . ALPRAZolam (XANAX) 0.5 MG tablet TK 1 T PO Q NIGHT SHIFT FOR ANXIETY / INSOMNIA  0  . aspirin EC 81 MG tablet Take 1 tablet (81 mg total) by mouth daily.    . Cholecalciferol (VITAMIN D-3) 1000 units CAPS Take 1,000 Units by mouth daily.    . clopidogrel (PLAVIX) 75 MG tablet Take 1 tablet (75 mg total) by mouth daily. 30 tablet 0  . empagliflozin (JARDIANCE)  10 MG TABS tablet Take 10 mg by mouth daily. 30 tablet 3  . fluticasone (FLONASE) 50 MCG/ACT nasal spray Place 2 sprays into both nostrils daily as needed for allergies or rhinitis.     Marland Kitchen glucose blood test strip Use as instructed 100 each 5  . HYDROCORTISONE, TOPICAL, 2 % LOTN Apply 1 application topically as needed (itching).    . Lancets (ONETOUCH ULTRASOFT) lancets Use as instructed 100 each 5  . latanoprost (XALATAN) 0.005 % ophthalmic solution Instill 1 drop into both eyes in the evening  0  . losartan (COZAAR) 50 MG tablet Take 1 tablet (50 mg total) by mouth daily. 90 tablet 3  . rosuvastatin (CRESTOR) 20 MG tablet Take 1 tablet (20 mg total) by mouth daily. 30 tablet 1  . timolol (TIMOPTIC) 0.5 % ophthalmic solution Instill 1 drop into the right eye in the morning  0   No facility-administered medications prior to visit.     Review of Systems;  Patient denies headache, fevers, malaise, unintentional weight loss, skin rash, eye pain, sinus congestion and sinus pain, sore throat, dysphagia,  hemoptysis , cough, dyspnea, wheezing, chest pain, palpitations, orthopnea, edema, abdominal pain, nausea, melena, diarrhea, constipation, flank pain, dysuria, hematuria, urinary  Frequency, nocturia, numbness, tingling, seizures,  Loss of consciousness,  Tremor, insomnia, depression, anxiety, and suicidal ideation.      Objective:  BP 112/66 (BP Location: Left Arm, Patient Position: Sitting, Cuff Size: Normal)   Pulse 75   Temp (!) 97.4 F (36.3 C) (Oral)   Resp 15   Ht 5\' 9"  (1.753 m)   Wt 143 lb (64.9 kg)   SpO2 96%   BMI 21.12 kg/m   BP Readings from Last 3 Encounters:  08/11/17 112/66  08/05/17 112/63  06/05/17 128/64    Wt Readings from Last 3 Encounters:  08/11/17 143 lb (64.9 kg)  06/03/17 160 lb 4.4 oz (72.7 kg)  05/31/17 155 lb 6.8 oz (70.5 kg)    General appearance: alert, cooperative and appears stated age. Seated in wheelchair.  Neck: no adenopathy, no carotid  bruit, supple, symmetrical, trachea midline and thyroid not enlarged, symmetric, no tenderness/mass/nodules Back: symmetric, no curvature. ROM normal. No CVA tenderness. Lungs: clear to auscultation bilaterally Heart: regular rate and rhythm, S1, S2 normal, no murmur, click, rub or gallop Abdomen: soft, non-tender; bowel sounds normal; no masses,  no organomegaly Pulses: 2+ and symmetric Skin: Skin color, texture, turgor normal. No rashes or lesions Lymph nodes: Cervical, supraclavicular, and axillary nodes normal. Psych:  Calm, affect alternates between tearful and upbeat.  Neuro: right sided hemiparesis.    Lab Results  Component Value Date   HGBA1C 6.6 (H) 06/01/2017   HGBA1C 6.9 (H) 05/06/2017   HGBA1C 7.7 (H) 12/13/2016    Lab Results  Component Value Date   CREATININE 1.60 (H) 06/03/2017   CREATININE 1.64 (H) 06/03/2017   CREATININE 1.57 (H) 05/31/2017    Lab Results  Component Value Date   WBC 8.9 06/03/2017   HGB 18.0 (H) 06/03/2017   HCT 53.0 (H) 06/03/2017   PLT 197 06/03/2017   GLUCOSE 164 (H) 06/03/2017   CHOL 101 06/01/2017   TRIG 169 (H) 06/01/2017   HDL 29 (L) 06/01/2017   LDLDIRECT 117.0 05/06/2017   LDLCALC 38 06/01/2017   ALT 37 06/03/2017   AST 35 06/03/2017   NA 141 06/03/2017   K 4.2 06/03/2017   CL 104 06/03/2017   CREATININE 1.60 (H) 06/03/2017   BUN 33 (H) 06/03/2017   CO2 24 06/03/2017   TSH 1.561 06/01/2017   PSA 1.52 06/23/2013   INR 1.05 06/03/2017   HGBA1C 6.6 (H) 06/01/2017   MICROALBUR 5.3 (H) 05/06/2017    US Soft Tissue Head & Neck (non-thyroid)  Result Date: 07/24/2017 CLINICAL DATA:  Left parotid indeterminate abnormality by CT and MRI EXAM: ULTRASOUND OF HEAD/NECK SOFT TISSUES TECHNIQUE: Ultrasound examination of the head and neck soft tissues was performed in the area of clinical concern. COMPARISON:  06/01/2017, 06/03/2017 FINDINGS: Superficial soft tissue ultrasound performed of the left parotid area. This demonstrates a  deep anechoic parotid cystic abnormality measuring 1.6 x 1.4 x 1.0 cm. No associated vascularity. This correlates with the CT and MRI finding. Appearance compatible with a parotid cyst by ultrasound. Therefore, biopsy or aspiration would be low yield. 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 Electronically Signed   By: Jerilynn Mages.  Shick M.D.   On: 07/24/2017 10:23    Assessment & Plan:   Problem List Items Addressed This Visit    CKD (chronic kidney disease), stage III ([place])    Stable, Secondary to diabetes mellitus and hypertension.  Continue losartan. Rosuvastatin.  And avoidance of NSAIDs   Lab Results  Component Value Date   CREATININE 1.60 (H) 06/03/2017         CVA (cerebral vascular accident) ([place])  Left pontine lacunar infarct. His right leg weakness has improved somewhat but he remains unable to ambulate or use his right hand.  His dysphagia  has improved and he is drinking thin liquids.  He remains mood labile.  Continue PT , OT , plavix, Crestor.        Essential hypertension, benign - Primary    Well controlled on current regimen. Renal function stable, no changes today.      Hyperlipidemia LDL goal <100    Managed with crestor  Since November.  No changes today  Lab Results  Component Value Date   CHOL 101 06/01/2017   HDL 29 (L) 06/01/2017   LDLCALC 38 06/01/2017   LDLDIRECT 117.0 05/06/2017   TRIG 169 (H) 06/01/2017   CHOLHDL 3.5 06/01/2017   Lab Results  Component Value Date   ALT 37 06/03/2017   AST 35 06/03/2017   ALKPHOS 79 06/03/2017   BILITOT 1.4 (H) 06/03/2017         Relevant Orders   Lipid panel   Type II diabetes mellitus with renal manifestations ([place])    He  Did not tolerate metformin due to persistent diarrhea.  He is tolerating jardiance 10 mg daily   Lab Results  Component Value Date   HGBA1C 6.6 (H) 06/01/2017   Lab Results  Component Value Date   MICROALBUR 5.3 (H) 05/06/2017           Relevant Orders   Hemoglobin A1c   Comprehensive metabolic panel   Vitamin D deficiency      I am having Anthony Allen start on nystatin. I am also having him maintain his HYDROCORTISONE (TOPICAL), latanoprost, timolol, fluticasone, glucose blood, onetouch ultrasoft, empagliflozin, losartan, clopidogrel, rosuvastatin, Vitamin D-3, ALPRAZolam, and aspirin EC.  Meds ordered this encounter  Medications  . nystatin (NYSTATIN) powder    Sig: Apply topically 2 (two) times daily.    Dispense:  30 g    Refill:  2    There are no discontinued medications.  Follow-up: Return in about 3 months (around 11/11/2017) for follow up diabetes,  labs march 25 .   Crecencio Mc, MD

## 2017-08-11 NOTE — Patient Instructions (Signed)
Try the WASA crackers  for a low carb alternative to ritz cracker    Truett Perna "just crack an egg" is a great high protein breakfast low carb and low salt

## 2017-08-13 NOTE — Assessment & Plan Note (Signed)
Left pontine lacunar CVA  With right sided weakness, improving but not resolved. Requires assistance

## 2017-08-13 NOTE — Assessment & Plan Note (Signed)
Well controlled on current regimen. Renal function stable, no changes today. 

## 2017-08-13 NOTE — Assessment & Plan Note (Signed)
He  Did not tolerate metformin due to persistent diarrhea.  He is tolerating jardiance 10 mg daily   Lab Results  Component Value Date   HGBA1C 6.6 (H) 06/01/2017   Lab Results  Component Value Date   MICROALBUR 5.3 (H) 05/06/2017

## 2017-08-13 NOTE — Assessment & Plan Note (Addendum)
Managed with crestor  Since November.  No changes today  Lab Results  Component Value Date   CHOL 101 06/01/2017   HDL 29 (L) 06/01/2017   LDLCALC 38 06/01/2017   LDLDIRECT 117.0 05/06/2017   TRIG 169 (H) 06/01/2017   CHOLHDL 3.5 06/01/2017   Lab Results  Component Value Date   ALT 37 06/03/2017   AST 35 06/03/2017   ALKPHOS 79 06/03/2017   BILITOT 1.4 (H) 06/03/2017

## 2017-08-13 NOTE — Assessment & Plan Note (Addendum)
Stable, Secondary to diabetes mellitus and hypertension.  Continue losartan. Rosuvastatin.  And avoidance of NSAIDs   Lab Results  Component Value Date   CREATININE 1.60 (H) 06/03/2017

## 2017-08-13 NOTE — Assessment & Plan Note (Addendum)
Left pontine lacunar infarct. His right leg weakness has improved somewhat but he remains unable to ambulate or use his right hand.  His dysphagia  has improved and he is drinking thin liquids.  He remains mood labile.  Continue PT , OT , plavix, Crestor.

## 2017-08-14 ENCOUNTER — Telehealth: Payer: Self-pay | Admitting: Internal Medicine

## 2017-08-14 MED ORDER — CLOPIDOGREL BISULFATE 75 MG PO TABS: 75.0000 mg | ORAL_TABLET | Freq: Every day | ORAL | 0 refills | Status: DC

## 2017-08-14 NOTE — Telephone Encounter (Signed)
Copied from [place] [phone]. Topic: Quick Communication - See Telephone Encounter >> Aug 14, 2017  9:48 AM Neva Seat wrote: Anthony Allen [phone] - [facility]   Verbal Order: Move discharge to next week - Speech Therapy

## 2017-08-14 NOTE — Telephone Encounter (Signed)
Spoke with [place] and verbal orders were given.

## 2017-08-14 NOTE — Telephone Encounter (Signed)
Copied from [place] [phone]. Topic: Quick Communication - Rx Refill/Question >> Aug 14, 2017  1:19 PM Aurelio Brash B wrote: Medication: clopidogrel (PLAVIX) 75 MG tablet  Has the patient contacted their pharmacy?yes    (Agent: If no, request that the patient contact the pharmacy for the refill.)   Preferred Pharmacy (with phone number or street name):[id number] - [place], [place] - [place] [phone] (Phone) [phone] (Fax)     Agent: Please be advised that RX refills may take up to 3 business days. We ask that you follow-up with your pharmacy.

## 2017-08-18 NOTE — Telephone Encounter (Signed)
Copied from [place] [id number]. Topic: Quick Communication - Rx Refill/Question >> Aug 14, 2017  1:19 PM Aurelio Brash B wrote: Medication: clopidogrel (PLAVIX) 75 MG tablet  Has the patient contacted their pharmacy?yes    (Agent: If no, request that the patient contact the pharmacy for the refill.)   Preferred Pharmacy (with phone number or street name):[id number] - [place], [place] - [place] [phone] (Phone) [phone] (Fax)     Agent: Please be advised that RX refills may take up to 3 business days. We ask that you follow-up with your pharmacy. >> Aug 18, 2017 11:19 AM Neva Seat wrote: Alprazolam 0.5 mg Clopidogrel 75 mg  Has been needing these filled since pt visit on 08-11-17.   [id number] - [place], [place] - [place] AT [place] [address] [place] [address] Phone: [phone] Fax: [phone]

## 2017-08-18 NOTE — Telephone Encounter (Signed)
  Refill for Alprazolam 0.5 MG  LOV on 08/11/17 PCP Dr. Derrel Nip [facility] on file.,  Unable to Pend this med due to message says ordered for future date

## 2017-08-19 MED ORDER — CLOPIDOGREL BISULFATE 75 MG PO TABS: 75.0000 mg | ORAL_TABLET | Freq: Every day | ORAL | 2 refills | Status: DC

## 2017-08-19 MED ORDER — ALPRAZOLAM 0.5 MG PO TABS: ORAL_TABLET | ORAL | 5 refills | Status: DC

## 2017-08-19 NOTE — Addendum Note (Signed)
Addended by: Adair Laundry on: 08/19/2017 01:01 PM   Modules accepted: Orders

## 2017-08-19 NOTE — Telephone Encounter (Signed)
Please advise 

## 2017-08-19 NOTE — Telephone Encounter (Signed)
The plavix was re sent and the alprazolam has been printed, signed and faxed.

## 2017-08-19 NOTE — Telephone Encounter (Signed)
The plavix was refilled for 30 days on march 7,  See chart. !    Call pharmacy and find out why they did not refill it. Alprazolam refilled and printed rx

## 2017-08-20 ENCOUNTER — Inpatient Hospital Stay: Payer: Medicare Other | Admitting: Internal Medicine

## 2017-08-28 ENCOUNTER — Telehealth: Payer: Self-pay | Admitting: Internal Medicine

## 2017-08-28 NOTE — Telephone Encounter (Signed)
Patient does not need to stop plavix for lab work right?

## 2017-08-28 NOTE — Telephone Encounter (Signed)
Left message to return call to office.

## 2017-08-28 NOTE — Telephone Encounter (Signed)
Please advise 

## 2017-08-28 NOTE — Telephone Encounter (Signed)
Pts wife called back to ask about the Plavix. She is aware of the message from Dr. Derrel Nip.

## 2017-08-28 NOTE — Telephone Encounter (Signed)
Copied from [place] [phone]. Topic: Quick Communication - See Telephone Encounter >> Aug 28, 2017 10:33 AM Lolita Rieger, RMA wrote: CRM for notification. See Telephone encounter for: 08/28/17.pt has upcoming lab work and wanted to know if he should stop taking plavix prior to appt please call [phone]

## 2017-08-28 NOTE — Telephone Encounter (Signed)
He should NOT stop plavix just for labs

## 2017-09-01 ENCOUNTER — Other Ambulatory Visit (INDEPENDENT_AMBULATORY_CARE_PROVIDER_SITE_OTHER): Payer: Medicare Other

## 2017-09-01 DIAGNOSIS — E1129 Type 2 diabetes mellitus with other diabetic kidney complication: Secondary | ICD-10-CM

## 2017-09-01 DIAGNOSIS — E785 Hyperlipidemia, unspecified: Secondary | ICD-10-CM

## 2017-09-01 LAB — COMPREHENSIVE METABOLIC PANEL
ALBUMIN: 3.5 g/dL (ref 3.5–5.2)
ALK PHOS: 84 U/L (ref 39–117)
ALT: 51 U/L (ref 0–53)
AST: 28 U/L (ref 0–37)
BILIRUBIN TOTAL: 0.9 mg/dL (ref 0.2–1.2)
BUN: 27 mg/dL — ABNORMAL HIGH (ref 6–23)
CALCIUM: 9.5 mg/dL (ref 8.4–10.5)
CO2: 28 meq/L (ref 19–32)
CREATININE: 1.26 mg/dL (ref 0.40–1.50)
Chloride: 101 mEq/L (ref 96–112)
GFR: 58.6 mL/min — AB (ref >60.00)
Glucose, Bld: 203 mg/dL — ABNORMAL HIGH (ref 70–99)
Potassium: 4.2 mEq/L (ref 3.5–5.1)
Sodium: 136 mEq/L (ref 135–145)
TOTAL PROTEIN: 6.6 g/dL (ref 6.0–8.3)

## 2017-09-01 LAB — LIPID PANEL
CHOL/HDL RATIO: 3
CHOLESTEROL: 79 mg/dL (ref 0–200)
HDL: 29.8 mg/dL — AB (ref >39.00)
LDL Cholesterol: 16 mg/dL (ref 0–99)
NonHDL: 48.75
TRIGLYCERIDES: 162 mg/dL — AB (ref 0.0–149.0)
VLDL: 32.4 mg/dL (ref 0.0–40.0)

## 2017-09-01 LAB — HEMOGLOBIN A1C: HEMOGLOBIN A1C: 5.7 % (ref 4.6–6.5)

## 2017-09-04 ENCOUNTER — Telehealth: Payer: Self-pay | Admitting: Neurology

## 2017-09-04 NOTE — Telephone Encounter (Signed)
Pt wife(on DPR) is calling stating they have been contacted by [facility] but pt wife is wanting pt to go to [facility] rehab.  Wife is asking if the order can be sent there instead.  Please call

## 2017-09-04 NOTE — Telephone Encounter (Signed)
Called and left patient's wife a message resent to [facility] and left there number on voice mail [phone].

## 2017-09-11 ENCOUNTER — Ambulatory Visit: Payer: Medicare Other

## 2017-09-15 ENCOUNTER — Telehealth: Payer: Self-pay | Admitting: *Deleted

## 2017-09-15 NOTE — Telephone Encounter (Signed)
Copied from [place] [phone]. Topic: Quick Communication - See Telephone Encounter >> Sep 15, 2017  1:10 PM Wynetta Emery, [place] C wrote: CRM for notification. See Telephone encounter for: 09/15/17.  Remo Lipps with home health called in to make PCP aware that due to her being sick last week she had to move pt's apt from last week to this week.   CB: [phone]

## 2017-09-15 NOTE — Telephone Encounter (Signed)
FYI

## 2017-09-18 ENCOUNTER — Telehealth: Payer: Self-pay | Admitting: Internal Medicine

## 2017-09-18 NOTE — Telephone Encounter (Signed)
Copied from [place] [phone]. Topic: Quick Communication - See Telephone Encounter >> Sep 18, 2017 11:23 AM Vernona Rieger wrote: CRM for notification. See Telephone encounter for: 09/18/17.  Lattie Haw from [facility] home [facility] would like to have verbal orders to see the patient for Occupational Therapy for 2 times a week for 6 weeks  Call back [phone]

## 2017-09-18 NOTE — Telephone Encounter (Signed)
FYI verbal given for OT>

## 2017-09-25 LAB — HM DIABETES EYE EXAM

## 2017-09-28 ENCOUNTER — Other Ambulatory Visit: Payer: Self-pay

## 2017-09-28 ENCOUNTER — Emergency Department
Admission: EM | Admit: 2017-09-28 | Discharge: 2017-09-28 | Disposition: A | Discharge status: Home / Self Care | Payer: Medicare Other | Attending: Student in an Organized Health Care Education/Training Program | Admitting: Student in an Organized Health Care Education/Training Program

## 2017-09-28 ENCOUNTER — Encounter: Payer: Self-pay | Admitting: Emergency Medicine

## 2017-09-28 DIAGNOSIS — E1122 Type 2 diabetes mellitus with diabetic chronic kidney disease: Secondary | ICD-10-CM | POA: Insufficient documentation

## 2017-09-28 DIAGNOSIS — Z7982 Long term (current) use of aspirin: Secondary | ICD-10-CM | POA: Diagnosis not present

## 2017-09-28 DIAGNOSIS — Z7902 Long term (current) use of antithrombotics/antiplatelets: Secondary | ICD-10-CM | POA: Insufficient documentation

## 2017-09-28 DIAGNOSIS — N183 Chronic kidney disease, stage 3 (moderate): Secondary | ICD-10-CM | POA: Insufficient documentation

## 2017-09-28 DIAGNOSIS — Z8673 Personal history of transient ischemic attack (TIA), and cerebral infarction without residual deficits: Secondary | ICD-10-CM | POA: Insufficient documentation

## 2017-09-28 DIAGNOSIS — I129 Hypertensive chronic kidney disease with stage 1 through stage 4 chronic kidney disease, or unspecified chronic kidney disease: Secondary | ICD-10-CM | POA: Diagnosis not present

## 2017-09-28 DIAGNOSIS — H1132 Conjunctival hemorrhage, left eye: Secondary | ICD-10-CM | POA: Diagnosis not present

## 2017-09-28 DIAGNOSIS — Z79899 Other long term (current) drug therapy: Secondary | ICD-10-CM | POA: Insufficient documentation

## 2017-09-28 DIAGNOSIS — H5789 Other specified disorders of eye and adnexa: Secondary | ICD-10-CM | POA: Diagnosis present

## 2017-09-28 MED ORDER — FLUORESCEIN SODIUM 1 MG OP STRP
ORAL_STRIP | OPHTHALMIC | Status: AC
  Administered 2017-09-28: 20:00:00
  Filled 2017-09-28: qty 1

## 2017-09-28 MED ORDER — TETRACAINE HCL 0.5 % OP SOLN
2.0000 [drp] | Freq: Once | OPHTHALMIC | Status: AC
Start: 2017-09-28 — End: 2017-09-28
  Administered 2017-09-28: 2 [drp] via OPHTHALMIC
  Filled 2017-09-28: qty 4

## 2017-09-28 MED ORDER — POLYMYXIN B-TRIMETHOPRIM 10000-0.1 UNIT/ML-% OP SOLN
1.0000 [drp] | OPHTHALMIC | Status: DC
  Administered 2017-09-28: 1 [drp] via OPHTHALMIC
  Filled 2017-09-28: qty 10

## 2017-09-28 MED ORDER — POLYMYXIN B-TRIMETHOPRIM 10000-0.1 UNIT/ML-% OP SOLN: 1.0000 [drp] | OPHTHALMIC | 0 refills | Status: DC

## 2017-09-28 NOTE — ED Notes (Signed)

## 2017-09-28 NOTE — ED Provider Notes (Signed)
[facility] Emergency Department Provider Note    First MD Initiated Contact with Patient 09/28/17 1920     (approximate)  I have reviewed the triage vital signs and the nursing notes.   HISTORY  Chief Complaint Eye Problem    HPI Anthony Allen is a 82 y.o. male on Plavix for history of CVA as well as having a history of glaucoma presents to the ER with bleeding and swelling to his left eye.  States his eye was itching and he was rubbing it.  Wife noted that his eye had become red and looked like it was swelling so she called EMS.  Patient denies any blurry vision.  Denies any pain but states his eye is still itching and feels some discomfort.  Is never had any issues with this before.  Past Medical History:  Diagnosis Date  . Arthritis    "mild in my hands" (06/04/2017)  . Complication of anesthesia    "was told never to use Propofol because of parent's adverse reaction" (06/04/2017)  . CVA (cerebral vascular accident) ([place])    hospitalized from 12/22-12/24 due to left-sided weakness, slurred speech and difficulty swallowing. He was diagnosed as left pontine stroke./notes 06/03/2017  . Family history of adverse reaction to anesthesia    "mother and dad had allergic reaction Propofol" (06/04/2017)  . Glaucoma, both eyes   . Gout   . Hypertension   . Migraine    "stopped when my wife quit smoking in ~ 1980" (06/04/2017)  . Parotid mass   . Type 2 diabetes mellitus with hyperglycemia ([facility])    Family History  Problem Relation Age of Onset  . Arthritis Mother   . Stroke Father   . Hypertension Father   . Heart disease Father 1       AMI  . Kidney disease Father        bladder ca congenital loss of kidney  . Arthritis Maternal Grandmother   . Early death Daughter 16       aspiration  . Cancer Brother        Scalp   Past Surgical History:  Procedure Laterality Date  . TONSILLECTOMY  1943   Patient Active Problem List   Diagnosis Date  Noted  . Diastolic dysfunction   . Dysphagia, post-stroke   . CKD (chronic kidney disease), stage III ([place]) 06/03/2017  . 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 Stroke (cerebrum) ([place]) 06/03/2017  . CVA (cerebral vascular accident) ([place]) 06/01/2017  . TIA (transient ischemic attack) 05/31/2017  . Fatigue 05/08/2017  . Aortic atherosclerosis ([place]) 05/08/2017  . Glaucoma 05/06/2017  . Lipoma of forehead 11/02/2016  . Type II diabetes mellitus with renal manifestations ([place]) 11/02/2016  . Hyperlipidemia LDL goal <100 11/02/2016  . Constipation in male 11/02/2016  . Insomnia due to anxiety and fear 11/02/2016  . Colon cancer screening 05/04/2015  . Vitamin D deficiency 05/02/2015  . Medicare annual wellness visit, subsequent 06/23/2013  . Essential hypertension, benign 03/22/2013  . Osteoarthritis 03/22/2013  . Numbness and tingling in left hand 03/22/2013  . GERD (gastroesophageal reflux disease) 03/22/2013  . BPH (benign prostatic hyperplasia) 03/22/2013      Prior to Admission medications   Medication Sig Start Date End Date Taking? Authorizing Provider  ALPRAZolam Duanne Moron) 0.5 MG tablet 30TK 1 T PO Q NIGHT SHIFT FOR ANXIETY / INSOMNIA 08/19/17   Crecencio Mc, MD  aspirin EC 81 MG tablet Take 1 tablet (81 mg total) by mouth  daily. 08/05/17   Venancio Poisson, NP  Cholecalciferol (VITAMIN D-3) 1000 units CAPS Take 1,000 Units by mouth daily.    [provider]  clopidogrel (PLAVIX) 75 MG tablet Take 1 tablet (75 mg total) by mouth daily. 08/19/17   Crecencio Mc, MD  empagliflozin (JARDIANCE) 10 MG TABS tablet Take 10 mg by mouth daily. 05/06/17   Crecencio Mc, MD  fluticasone (FLONASE) 50 MCG/ACT nasal spray Place 2 sprays into both nostrils daily as needed for allergies or rhinitis.     [provider]  glucose blood test strip Use as instructed 01/30/17   Crecencio Mc, MD  HYDROCORTISONE, TOPICAL, 2 % LOTN Apply 1 application topically as needed  (itching).    [provider]  Lancets Glory Rosebush ULTRASOFT) lancets Use as instructed 01/30/17   Crecencio Mc, MD  latanoprost (XALATAN) 0.005 % ophthalmic solution Instill 1 drop into both eyes in the evening 07/11/16   [provider]  losartan (COZAAR) 50 MG tablet Take 1 tablet (50 mg total) by mouth daily. 05/09/17   Crecencio Mc, MD  nystatin (NYSTATIN) powder Apply topically 2 (two) times daily. 08/11/17   Crecencio Mc, MD  rosuvastatin (CRESTOR) 20 MG tablet Take 1 tablet (20 mg total) by mouth daily. 06/02/17   Henreitta Leber, MD  timolol (TIMOPTIC) 0.5 % ophthalmic solution Instill 1 drop into the right eye in the morning 07/11/16   [provider]    Allergies Patient has no known allergies.    Social History Social History   Tobacco Use  . Smoking status: Never Smoker  . Smokeless tobacco: Never Used  Substance Use Topics  . Alcohol use: Yes    Comment: 06/04/2017 "might have 1 drink q 2 wk; if that"  . Drug use: No    Review of Systems Patient denies headaches, rhinorrhea, blurry vision, numbness, shortness of breath, chest pain, edema, cough, abdominal pain, nausea, vomiting, diarrhea, dysuria, fevers, rashes or hallucinations unless otherwise stated above in HPI. ____________________________________________   PHYSICAL EXAM:  VITAL SIGNS: Vitals:   09/28/17 1930 09/28/17 2000  BP: 132/72 135/88  Pulse: 76 85  Resp:  19  Temp:    SpO2: 98% 97%    Constitutional: Alert and oriented. Well appearing and in no acute distress. Eyes: large subconjunctival hemorrhage with chemosis, no hyphema. No proptosis Head: Atraumatic. Nose: No congestion/rhinnorhea. Mouth/Throat: Mucous membranes are moist.   Neck: No stridor. Painless ROM.  Cardiovascular: Normal rate, regular rhythm. Grossly normal heart sounds.  Good peripheral circulation. Respiratory: Normal respiratory effort.  No retractions. Lungs CTAB. Gastrointestinal: Soft and  nontender. No distention. No abdominal bruits. No CVA tenderness. Genitourinary:  Musculoskeletal: No lower extremity tenderness nor edema.  No joint effusions. Neurologic:  Normal speech and language. No gross focal neurologic deficits are appreciated. No facial droop Skin:  Skin is warm, dry and intact. __________________________________   LABS (all labs ordered are listed, but only abnormal results are displayed)  No results found for this or any previous visit (from the past 24 hour(s)). ____________________________________________ ____________________________________________   PROCEDURES  Procedure(s) performed:  Procedures    Critical Care performed: no ____________________________________________   INITIAL IMPRESSION / ASSESSMENT AND PLAN / ED COURSE  Pertinent labs & imaging results that were available during my care of the patient were reviewed by me and considered in my medical decision making (see chart for details).  DDX: Subconjunctival hemorrhage, globe rupture, conjunctivitis  Anthony Allen is a 82 y.o.  who presents to the ED with symptoms as described above.  No evidence of acute glaucoma.  IOP left eye is 18 and 20 in the right.  No visual acuity deficit.  No evidence of corneal abrasion.  Does have fairly sizable ecchymosis likely causing some of the discomfort but certainly no evidence of emergent vision threatening pathology at this time patient stable and appropriate for outpatient follow-up.      As part of my medical decision making, I reviewed the following data within the [facility] notes reviewed and incorporated, Labs reviewed, notes from prior ED visits.  ____________________________________________   FINAL CLINICAL IMPRESSION(S) / ED DIAGNOSES  Final diagnoses:  Subconjunctival hemorrhage of left eye      NEW MEDICATIONS STARTED DURING THIS VISIT:  New Prescriptions   No medications on file     Note:   This document was prepared using Dragon voice recognition software and may include unintentional dictation errors.    Merlyn Lot, MD 09/28/17 2030

## 2017-09-28 NOTE — ED Triage Notes (Addendum)
Pt bib PTAR from home d/t bleeding in left eye. Pt states eye was itching, after rubbing it wife noticed bleeding and called 911. Pt states hx glaucoma and last eval was Thursday at [facility] eye. Hx CVA last Christmas-plavix

## 2017-09-28 NOTE — ED Notes (Signed)
ED Provider at bedside. 

## 2017-09-29 ENCOUNTER — Telehealth: Payer: Self-pay | Admitting: Internal Medicine

## 2017-09-29 NOTE — Telephone Encounter (Signed)
Returned patients call and spoke with patients wife and she stated that she wanted to let the office know that her husband was seen at the ER for a ruptured eye vessel and that he was advised to see his eye doctor and she stated that she will call the eye doctor and make him an appoitnment.

## 2017-09-29 NOTE — Telephone Encounter (Signed)
Copied from [place]. Topic: General - Other >> Sep 29, 2017  4:08 PM Margot Ables wrote: Requesting VO to add SN to plan of care. Pt went to ER 09/28/17 with a bursted blood vessel in eye. She would like to do nurse evaluation. Patient also has questions about pain management.

## 2017-10-18 ENCOUNTER — Other Ambulatory Visit: Payer: Self-pay | Admitting: Internal Medicine

## 2017-10-23 ENCOUNTER — Ambulatory Visit
Admission: RE | Admit: 2017-10-23 | Discharge: 2017-10-23 | Disposition: A | Discharge status: Home / Self Care | Payer: Medicare Other | Source: Ambulatory Visit | Attending: Unknown Physician Specialty | Admitting: Unknown Physician Specialty

## 2017-10-24 ENCOUNTER — Encounter: Payer: Self-pay | Admitting: Occupational Therapy

## 2017-10-24 ENCOUNTER — Other Ambulatory Visit: Payer: Self-pay

## 2017-10-24 ENCOUNTER — Encounter: Payer: Self-pay | Admitting: Rehabilitation

## 2017-10-24 ENCOUNTER — Ambulatory Visit: Payer: Medicare Other | Attending: Adult Health | Admitting: Rehabilitation

## 2017-10-24 ENCOUNTER — Ambulatory Visit: Payer: Medicare Other | Admitting: Occupational Therapy

## 2017-10-24 VITALS — BP 104/66 | HR 77

## 2017-10-24 DIAGNOSIS — R2689 Other abnormalities of gait and mobility: Secondary | ICD-10-CM | POA: Diagnosis present

## 2017-10-24 DIAGNOSIS — I69351 Hemiplegia and hemiparesis following cerebral infarction affecting right dominant side: Secondary | ICD-10-CM | POA: Insufficient documentation

## 2017-10-24 DIAGNOSIS — M25641 Stiffness of right hand, not elsewhere classified: Secondary | ICD-10-CM | POA: Diagnosis present

## 2017-10-24 DIAGNOSIS — R2681 Unsteadiness on feet: Secondary | ICD-10-CM | POA: Insufficient documentation

## 2017-10-24 DIAGNOSIS — I69315 Cognitive social or emotional deficit following cerebral infarction: Secondary | ICD-10-CM | POA: Diagnosis present

## 2017-10-24 DIAGNOSIS — R600 Localized edema: Secondary | ICD-10-CM

## 2017-10-24 DIAGNOSIS — R41842 Visuospatial deficit: Secondary | ICD-10-CM | POA: Insufficient documentation

## 2017-10-24 DIAGNOSIS — M25631 Stiffness of right wrist, not elsewhere classified: Secondary | ICD-10-CM

## 2017-10-24 DIAGNOSIS — M6281 Muscle weakness (generalized): Secondary | ICD-10-CM

## 2017-10-24 DIAGNOSIS — R293 Abnormal posture: Secondary | ICD-10-CM | POA: Diagnosis present

## 2017-10-24 DIAGNOSIS — M25611 Stiffness of right shoulder, not elsewhere classified: Secondary | ICD-10-CM | POA: Diagnosis present

## 2017-10-24 NOTE — Therapy (Signed)
[place] [address] [place], [place], [address] Phone: [phone]   Fax:  [phone]  Physical Therapy Evaluation  Patient Details  Name: Anthony Allen MRN: [id number] Date of Birth: 1935-11-04 Referring Provider: Venancio Poisson   Encounter Date: 10/24/2017  PT End of Session - 10/24/17 1240    Visit Number  1    Number of Visits  17    Date for PT Re-Evaluation  12/23/17    Authorization Type  [employer] Medicare    PT Start Time  1101    PT Stop Time  1145    PT Time Calculation (min)  44 min    Equipment Utilized During Treatment  Gait belt    Activity Tolerance  Patient tolerated treatment well    Behavior During Therapy  [facility] for tasks assessed/performed       Past Medical History:  Diagnosis Date  . Arthritis    "mild in my hands" (06/04/2017)  . Complication of anesthesia    "was told never to use Propofol because of parent's adverse reaction" (06/04/2017)  . CVA (cerebral vascular accident) ([place])    hospitalized from 12/22-12/24 due to left-sided weakness, slurred speech and difficulty swallowing. He was diagnosed as left pontine stroke./notes 06/03/2017  . Family history of adverse reaction to anesthesia    "mother and dad had allergic reaction Propofol" (06/04/2017)  . Glaucoma, both eyes   . Gout   . Hypertension   . Migraine    "stopped when my wife quit smoking in ~ 1980" (06/04/2017)  . Parotid mass   . Type 2 diabetes mellitus with hyperglycemia [facility])     Past Surgical History:  Procedure Laterality Date  . TONSILLECTOMY  1943    There were no vitals filed for this visit.   Subjective Assessment - 10/24/17 1106    Subjective  "My left side is okay but my right side is still weak. The PT at home had me on a walker and sometimes a cane if he was holding me.  I have a lot of pain in my R upper arm.  Also I am starting to have some L hip pain from sitting all the time.  Also I had a  tail bone injury     Patient is accompained by:  Family member lyndia (pronounced "Linda")     Pertinent History  glaucoma    Limitations  House hold activities;Walking;Standing    Patient Stated Goals  "walk better."     Currently in Pain?  No/denies         [facility] PT Assessment - 10/24/17 0001      Assessment   Medical Diagnosis  L pontine CVA    Referring Provider  Venancio Poisson, NP    Onset Date/Surgical Date  05/31/17    Prior Therapy  acute, SNF, HHPT/OT      Precautions   Precautions  Fall      Restrictions   Weight Bearing Restrictions  No      Balance Screen   Has the patient fallen in the past 6 months  No    Has the patient had a decrease in activity level because of a fear of falling?   Yes    Is the patient reluctant to leave their home because of a fear of falling?   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     Heel Shin Test  no dysmetria      ROM / Strength   AROM / PROM / Strength  Strength      Strength   Overall Strength  Deficits    Overall Strength Comments  LLE  WFL (4/5 hip flex-seated), R hip flex 3/5, R knee ext 4/5, R knee flex 3/5, R ankle DF 3-/5 (not full range), seated hip abd/add 4/5      Transfers   Transfers  Sit to Stand;Stand to Sit    Sit to Stand  5: Supervision    Sit to Stand Details  Verbal cues for precautions/safety    Five time sit to stand comments   24.93 secs w/ BUE support    Stand to Sit  5: Supervision    Stand to Sit Details (indicate cue type and reason)  Verbal cues for precautions/safety      Ambulation/Gait   Ambulation/Gait  Yes    Ambulation/Gait Assistance  4: Min guard    Ambulation Distance (Feet)  115 Feet    Assistive device  Rolling walker    Gait Pattern  Step-through pattern;Decreased arm swing - right;Decreased arm swing - left;Decreased stride length;Decreased dorsiflexion - right;Decreased weight shift to right;Right foot flat;Trunk flexed;Narrow base of support    Ambulation Surface  Level;Indoor    Gait velocity  1.34 ft/sec with RW and min/guard A      Standardized Balance Assessment   Standardized Balance Assessment  Timed Up and Go Test      Timed Up and Go Test   TUG  Normal TUG    Normal TUG (seconds)  36.12 with RW, min/guard A                Objective measurements completed on examination: See above findings.              PT Education - 10/24/17 1240    Education provided  Yes    Education Details  Evaluation findings, POC, goals    Person(s) Educated  Patient;Spouse    Methods  Explanation    Comprehension  Verbalized understanding       PT Short Term Goals - 10/24/17 1250      PT SHORT TERM GOAL #1   Title  Pt/wife will initiate HEP in order to indicate improved functional mobility (Target date: 11/23/17)    Time  4    Period  Weeks    Status  New  Target Date  11/23/17      PT SHORT TERM GOAL #2   Title  Pt will improve TUG to </=33 secs w/ LRAD at S level in order to indicate decreased fall risk.      Time  4    Period  Weeks    Status  New       PT SHORT TERM GOAL #3   Title  Will assess BERG and improve score by 3 points from baseline in order to indicate decreased fall risk.     Time  4    Period  Weeks    Status  New      PT SHORT TERM GOAL #4   Title  Pt will improve 5TSS to </= 21 secs with single UE support only in order to indicate decreased fall risk and improved functional strength.      Time  4    Period  Weeks    Status  New      PT SHORT TERM GOAL #5   Title  Pt will improve gait speed to 1.94 ft/sec w/ LRAD at S level in order to indicate decreased fall risk.      Time  4    Period  Weeks    Status  New      Additional Short Term Goals   Additional Short Term Goals  Yes      PT SHORT TERM GOAL #6   Title  Pt will ambulate short household distances (up to 50') w/ quad cane at S level in order to indicate improved independence at home.     Time  4    Period  Weeks    Status  New        PT Long Term Goals - 10/24/17 1256      PT LONG TERM GOAL #1   Title  Pt/wife will be independent with final HEP in order to indicate improved functional mobility and decreased fall risk. (Target Date: 12/23/17)    Time  8    Period  Weeks    Status  New    Target Date  12/23/17      PT LONG TERM GOAL #2   Title  Pt will improve BERG balance score by 6 points from baseline in order to indicate decreased fall risk.     Time  8    Period  Weeks    Status  New      PT LONG TERM GOAL #3   Title  Pt will improve TUG time to </=30 secs w/ LRAD at mod I level in order to indicate decreased fall risk.      Time  8    Period  Weeks    Status  New      PT LONG TERM GOAL #4   Title  Pt will improve 5TSS to </=19 secs without UE support in order to indicate improved functional strength and decreased fall risk.      Time  8    Period  Weeks    Status  New      PT LONG TERM GOAL #5   Title  Pt will ambulate x 500' w/ LRAD over indoor surfaces at mod I level in order to indicate improved functional mobility.      Time   8    Period  Weeks    Status  New      Additional Long Term Goals   Additional Long Term Goals  Yes      PT LONG TERM GOAL #6   Title  Pt will ambulate x 300' over unlevel paved outdoor surfaces w/ LRAD at S level in order to indicate improved community negotiation.      Time  8    Period  Weeks    Status  New             Plan - 10/24/17 1243    Clinical Impression Statement  Pt presents s/p L pontine CVA on 05/31/17 with D/C to SNF in [facility] for 6 weeks, then received HHPT/OT/SLP for 6 more weeks.  Pt with R sided hemiplegia, decreased balance, decreased endurance, vision deficits (will have OT assess more) and decreased mobility.  Note history of HTN and DMII along with arthritis.  Upon PT evaluation, note gait speed of 1.34 ft/sec indicative of high fall risk, TUG time of 36.12 secs with RW indicative of high fall risk and 5TSS time of 24.93 secs with BUE support indicative of decreased functional strength and elevated fall risk.  Pt will benefit from skilled OP neuro PT in order to address deficits.      History and Personal Factors relevant to plan of care:  see above    Clinical Presentation  Evolving    Clinical Presentation due to:  see above    Clinical Decision Making  Moderate    Rehab Potential  Good    Clinical Impairments Affecting Rehab Potential  Pt very motivated     PT Frequency  2x / week    PT Duration  8 weeks    PT Treatment/Interventions  ADLs/Self Care Home Management;Electrical Stimulation;DME Instruction;Gait training;Stair training;Functional mobility training;Therapeutic activities;Therapeutic exercise;Balance training;Neuromuscular re-education;Patient/family education;Orthotic Fit/Training;Passive range of motion;Vestibular    PT Next Visit Plan  BERG-adjust goal if needed, initiate HEP for LE strengthening/R NMR, balance, gait with LRAD (has used quad cane at home with HHPT).  R ankle DF strength-watch to see if we need brace, Bioness?    Consulted  and Agree with Plan of Care  Patient;Family member/caregiver    Family Member Consulted  wife [place] (goes by [employer])       Patient will benefit from skilled therapeutic intervention in order to improve the following deficits and impairments:  Abnormal gait, Decreased activity tolerance, Decreased balance, Decreased coordination, Decreased endurance, Decreased knowledge of use of DME, Decreased mobility, Decreased range of motion, Decreased strength, Impaired perceived functional ability, Impaired flexibility, Improper body mechanics, Postural dysfunction, Impaired UE functional use  Visit Diagnosis: Hemiplegia and hemiparesis following cerebral infarction affecting right dominant side (HCC)  Unsteadiness on feet  Muscle weakness (generalized)  Other abnormalities of gait and mobility  Abnormal posture     Problem List Patient Active Problem List   Diagnosis Date Noted  . Diastolic dysfunction   . Dysphagia, post-stroke   . CKD (chronic kidney disease), stage III ([place]) 06/03/2017  . 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 Stroke (cerebrum) ([place]) 06/03/2017  . CVA (cerebral vascular accident) ([place]) 06/01/2017  . TIA (transient ischemic attack) 05/31/2017  . Fatigue 05/08/2017  . Aortic atherosclerosis ([place]) 05/08/2017  . Glaucoma 05/06/2017  . Lipoma of forehead 11/02/2016  . Type II diabetes mellitus with renal manifestations ([place]) 11/02/2016  . Hyperlipidemia LDL goal <100 11/02/2016  . Constipation in male 11/02/2016  . Insomnia due to anxiety and fear 11/02/2016  . Colon cancer screening 05/04/2015  . Vitamin D deficiency 05/02/2015  . Medicare annual wellness visit, subsequent 06/23/2013  . Essential hypertension, benign  03/22/2013  . Osteoarthritis 03/22/2013  . Numbness and tingling in left hand 03/22/2013  . GERD (gastroesophageal reflux disease) 03/22/2013  . BPH (benign prostatic hyperplasia) 03/22/2013    Cameron Sprang, PT, MPT [facility] [address] [place], [place], [address] Phone: [phone]   Fax:  [phone] 10/24/17, 1:17 PM  Name: Anthony Allen MRN: [id number] Date of Birth: [birth year]

## 2017-10-24 NOTE — Therapy (Signed)
[facility] [address] [place], [place], [address] Phone: [phone]   Fax:  [phone]  Occupational Therapy Evaluation  Patient Details  Name: Anthony Allen MRN: [id number] Date of Birth: [birth year] Referring Provider: Venancio Poisson   Encounter Date: 10/24/2017  OT End of Session - 10/24/17 1346    Visit Number  1    Number of Visits  24    Date for OT Re-Evaluation  01/22/18    Authorization Type  [employer] Medicare.  $20 copay each OT/PT    Authorization - Visit Number  1    Authorization - Number of Visits  10    OT Start Time  1145    OT Stop Time  1245    OT Time Calculation (min)  60 min    Behavior During Therapy  [facility] for tasks assessed/performed       Past Medical History:  Diagnosis Date  . Arthritis    "mild in my hands" (06/04/2017)  . Complication of anesthesia    "was told never to use Propofol because of parent's adverse reaction" (06/04/2017)  . CVA (cerebral vascular accident) ([place])    hospitalized from 12/22-12/24 due to left-sided weakness, slurred speech and difficulty swallowing. He was diagnosed as left pontine stroke./notes 06/03/2017  . Family history of adverse reaction to anesthesia    "mother and dad had allergic reaction Propofol" (06/04/2017)  . Glaucoma, both eyes   . Gout   . Hypertension   . Migraine    "stopped when my wife quit smoking in ~ 1980" (06/04/2017)  . Parotid mass   . Type 2 diabetes mellitus with hyperglycemia [facility])     Past Surgical History:  Procedure Laterality Date  . TONSILLECTOMY  1943    Vitals:   10/24/17 1151  BP: 104/66  Pulse: 77    Subjective Assessment - 10/24/17 1153    Subjective   If I could use this hand better (right)      Patient is accompained by:  Family member wife [name]    Currently in Pain?  No/denies    Multiple Pain Sites  No        [facility] OT Assessment - 10/24/17 1155      Assessment   Medical Diagnosis  L  pontine CVA    Referring Provider  Venancio Poisson    Onset Date/Surgical Date  05/31/17    Hand Dominance  Right    Prior Therapy  acute, SNF, HHPT/OT/ST      Precautions   Precautions  Fall      Restrictions   Weight Bearing Restrictions  No      Balance Screen   Has the patient fallen in the past 6 months  No      Prior Function   Level of Independence  Independent with basic ADLs;Independent with gait    Vocation  Retired    Teaching laboratory technician, Engineering geologist, [place] artifacts, music, decoys, TV      ADL   Eating/Feeding  Minimal assistance    Grooming  Set up    Upper Body Bathing  Minimal assistance    Lower Body Bathing  Maximal assistance    Upper Body Dressing  Moderate assistance    Lower Body Dressing  Maximal assistance    Toilet Transfer  Minimal assistance    Toileting - Clothing Manipulation  Minimal assistance    Where Assessed - Toileting Clothing Manipulationn  Moderate assistance    Clinical cytogeneticist  Minimal assistance      IADL   Prior Level of Function Shopping  Independent    Shopping  Completely unable to shop    Prior Level of Function Light Housekeeping  Independent    Light Housekeeping  Does not participate in any housekeeping tasks    Prior Level of Function Meal Prep  independent    Meal Prep  Needs to have meals prepared and served    Prior Level of Function Sales executive  Relies on family or friends for transportation    Prior Level of Function Medication Managment  Independent    Prior Level of Function Financial Management  Independent    Financial Management  Requires assistance      Written Expression   Dominant Hand  Right    Handwriting  50% legible      Vision - History   Baseline Vision  Wears glasses only for reading    Visual History  Glaucoma    Additional Comments  Initially patient had diplopia, he reports this as resolved      Vision Assessment   Eye Alignment   Impaired (comment) maintains eyes in midline - visual inattention to right    Ocular Range of Motion  Within Functional Limits    Alignment/Gaze Preference  Other (comment) maintains eyes in midline - does not spontaneously look righ    Tracking/Visual Pursuits  --    Saccades  Additional eye shifts occurred during testing      Activity Tolerance   Activity Tolerance  -- endurance is significantly compromised    Activity Tolerance Comments  Patient appears very frail, thin- reports poor endurance, easily fatigued with ADL      Cognition   Overall Cognitive Status  Impaired/Different from baseline    Area of Impairment  Following commands;Attention;Safety/judgement;Awareness    Current Attention Level  Sustained    Attention Comments  Poor topic maintenance, although wife indicates patient is mentally sharp as prior    Following Commands  Follows one step commands inconsistently    Following Command Comments  frequent cues needed during sensory teating    Safety/Judgement  Decreased awareness of deficits    Awareness  Intellectual    Cognition Comments  Patient has high intelligence, although is tangential in conversation, and needs redirection.  Patient inattentive to right environment visually      Observation/Other Assessments   Observations  Skilled clinical judgement    Skin Integrity  Patient with sore coccyx    Focus on Therapeutic Outcomes (FOTO)   incomplete    Outcome Measures  Box and blocks      Posture/Postural Control   Posture/Postural Control  Postural limitations    Postural Limitations  Rounded Shoulders;Forward head;Weight shift left;Increased thoracic kyphosis;Posterior pelvic tilt      Sensation   Light Touch  Appears Intact    Stereognosis  Not tested    Hot/Cold  Not tested    Proprioception  Appears Intact      Coordination   Gross Motor Movements are Fluid and Coordinated  No    Fine Motor Movements are Fluid and Coordinated  No    Coordination and  Movement Description  little active movement due to secondary stiffness in right upper extremity    9 Hole Peg Test  Right;Left    Right 9 Hole Peg Test  1 peg in 1.17 min    Left 9 Hole Peg Test  40.50  Box and Blocks  17 right      Praxis   Praxis  Not tested      Edema   Edema  digits of right hand specifically PIP/DIP      Tone   Assessment Location  Right Upper Extremity      ROM / Strength   AROM / PROM / Strength  AROM;PROM      AROM   Overall AROM   Deficits    Overall AROM Comments  significant deficits proximal - shoulder, and distal, forearm, wrist and digits      PROM   Overall PROM   Deficits    Overall PROM Comments  significant deficits shoulder, forearm, wrist and digits      Strength   Overall Strength  Unable to assess      Right Hand AROM   R Index PIP 0-100  55 Degrees    R Long PIP 0-100  50 Degrees    R Ring PIP 0-100  45 Degrees    R Little PIP 0-100  45 Degrees      Hand Function   Right Hand Gross Grasp  Impaired    Right Hand Grip (lbs)  0    Right Hand Lateral Pinch  2 lbs    Left Hand Gross Grasp  Functional    Left Hand Grip (lbs)  53    Left Hand Lateral Pinch  12 lbs      RUE Tone   RUE Tone  Mild;Hypertonic ELBOW      RUE Tone   Hypertonic Details  Elbow                      OT Education - 10/24/17 1346    Education provided  Yes    Education Details  evaluation findings, plan of care - OT, and potential goals    Person(s) Educated  Patient    Comprehension  Verbalized understanding       OT Short Term Goals - 10/24/17 1408      OT SHORT TERM GOAL #1   Title  Patient will don/doff tshirt with set up assistance due 11/23/17    Time  4    Period  Weeks    Status  New    Target Date  11/23/17      OT SHORT TERM GOAL #2   Title  Patient will don socks with min assist    Time  4    Period  Weeks    Status  New      OT SHORT TERM GOAL #3   Title  Patient will don pants with min assistance    Time  4     Period  Weeks    Status  New      OT SHORT TERM GOAL #4   Title  Patient will complete HEP designed to improve shoulder range of motion with min assist and cueing    Time  4    Period  Weeks    Status  New        OT Long Term Goals - 10/24/17 1413      OT LONG TERM GOAL #1   Title  Patient will dress himself with only occasional min assistance    Time  12    Period  Weeks    Status  New    Target Date  01/22/18      OT LONG TERM GOAL #2   Title  Patient will transfer into and out of shower with supervision    Time  12    Period  Weeks    Status  New      OT LONG TERM GOAL #3   Title  Patient will feed himself, and cut up his own food with set up assistance    Time  12    Period  Weeks    Status  New      OT LONG TERM GOAL #4   Title  Patient will demonstrate at keast 5 degrees additional PIP flexion in digits 2-5 of right hand to aide in grasp and pinch use.      Baseline  2-55, 3-50. 4-45, and 5- 45    Time  12    Period  Weeks    Status  New      OT LONG TERM GOAL #5   Title  Patient will demonstrate active supination at least 10 degrees beyond neutral to aide in orienting hand to object needed for functional task    Baseline  neutral supination    Time  12    Period  Weeks    Status  New      Long Term Additional Goals   Additional Long Term Goals  Yes      OT LONG TERM GOAL #6   Title  Patient will demonstrate active low reach in right arm to obtain lightweight (1 lb or less)  item from countertop or tabletop if standing    Time  12    Period  Weeks    Status  New      OT LONG TERM GOAL #7   Title  Patient will demonstrate at leaast 90 degrees of passive shoulder flexion or abduction with pain no greater than 2/10 to aide with hygiene and dressing tasks    Time  12    Period  Weeks    Status  New            Plan - 10/24/17 1351    Clinical Impression Statement  Patient is a 82 year old man with history of left pontine stroke in Dec 2018.   Patient has received rehab in hospital, skilled nursing facility, and home health prior to coming to outpatient therapy.  Patient has medical history significant for HTN, DM, and glaucoma.  Patient presents to OT eval today with dependence on caregivers for basic self care skills, and all IADLS due to right hemiplegia, secondary musculoskeletal changes in shoulder, forearm and hand, decreased cognition- attention, awareness, executive functioning, decreased activity tolerance, decreased visual attention to right, and decreased balance.  Patient was independent with ADL/IADL prior to this strok, and now requires maximal assistance for bathing and dressing, and minimal assistance to feed himself.  Patient is unable to engage in any  IADL/Avocational interests at this time.  Patient will benefit from skilled OT intervention to decrease caregiver burden with basic self care skills, and to regain some functional use on right hand.      Occupational Profile and client history currently impacting functional performance  Husband, father - son with significant psychiatric dx- currently stable on medication, retired Quarry manager, Financial trader, Geophysicist/field seismologist, and Insurance claims handler performance deficits (Please refer to evaluation for details):  ADL's;IADL's;Rest and Sleep;Leisure;Social Participation    Rehab Potential  Fair    Current Impairments/barriers affecting progress:  concern for RSD right UE     OT Frequency  2x / week  OT Duration  12 weeks    OT Treatment/Interventions  Self-care/ADL training;Electrical Stimulation;Therapeutic exercise;Visual/perceptual remediation/compensation;Patient/family education;Splinting;Neuromuscular education;Paraffin;Moist Heat;Aquatic Therapy;Traction;Fluidtherapy;Functional Mobility Training;Scar mobilization;Therapeutic activities;Balance training;Energy conservation;Cognitive remediation/compensation;Passive range of motion;Manual Therapy;DME and/or AE  instruction;Contrast Bath;Ultrasound;Cryotherapy    Plan  Need to estabish HEP - Passive range program for RUE.  gentle mobilization RUE - Heat / [place] right hand    Clinical Decision Making  Multiple treatment options, significant modification of task necessary    OT Home Exercise Plan  Need to determine what has already been established    Consulted and Agree with Plan of Care  Patient;Family member/caregiver    Family Member Consulted  wife - [name]       Patient will benefit from skilled therapeutic intervention in order to improve the following deficits and impairments:  Decreased cognition, Decreased knowledge of use of DME, Decreased skin integrity, Increased edema, Impaired flexibility, Impaired vision/preception, Pain, Abnormal gait, Cardiopulmonary status limiting activity, Decreased coordination, Decreased mobility, Increased fascial restrictions, Impaired sensation, Improper body mechanics, Decreased activity tolerance, Decreased endurance, Decreased range of motion, Decreased strength, Impaired tone, Improper spinal/pelvic alignment, Decreased balance, Decreased knowledge of precautions, Decreased safety awareness, Difficulty walking, Impaired perceived functional ability, Impaired UE functional use  Visit Diagnosis: Stiffness of right shoulder, not elsewhere classified - Plan: Ot plan of care cert/re-cert  Hemiplegia and hemiparesis following cerebral infarction affecting right dominant side ([place]) - Plan: Ot plan of care cert/re-cert  Stiffness of right wrist, not elsewhere classified - Plan: Ot plan of care cert/re-cert  Stiffness of right hand, not elsewhere classified - Plan: Ot plan of care cert/re-cert  Visuospatial deficit - Plan: Ot plan of care cert/re-cert  Localized edema - Plan: Ot plan of care cert/re-cert  Cognitive social or emotional deficit following cerebral infarction - Plan: Ot plan of care cert/re-cert  Muscle weakness (generalized) - Plan: Ot plan of care  cert/re-cert  Unsteadiness on feet - Plan: Ot plan of care cert/re-cert    Problem List Patient Active Problem List   Diagnosis Date Noted  . Diastolic dysfunction   . Dysphagia, post-stroke   . CKD (chronic kidney disease), stage III ([place]) 06/03/2017  . 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 Stroke (cerebrum) ([place]) 06/03/2017  . CVA (cerebral vascular accident) ([place]) 06/01/2017  . TIA (transient ischemic attack) 05/31/2017  . Fatigue 05/08/2017  . Aortic atherosclerosis ([place]) 05/08/2017  . Glaucoma 05/06/2017  . Lipoma of forehead 11/02/2016  . Type II diabetes mellitus with renal manifestations ([place]) 11/02/2016  . Hyperlipidemia LDL goal <100 11/02/2016  . Constipation in male 11/02/2016  . Insomnia due to anxiety and fear 11/02/2016  . Colon cancer screening 05/04/2015  . Vitamin D deficiency 05/02/2015  . Medicare annual wellness visit, subsequent 06/23/2013  . Essential hypertension, benign 03/22/2013  . Osteoarthritis 03/22/2013  . Numbness and tingling in left hand 03/22/2013  . GERD (gastroesophageal reflux disease) 03/22/2013  . BPH (benign prostatic hyperplasia) 03/22/2013    Mariah Milling, OTR/L 10/24/2017, 2:30 PM  [facility] [address] [place], [place], [address] Phone: [phone]   Fax:  [phone]  Name: YAMIR CARIGNAN MRN: [id number] Date of Birth: [birth year]

## 2017-10-27 ENCOUNTER — Encounter: Payer: Self-pay | Admitting: Physical Therapy

## 2017-10-27 ENCOUNTER — Ambulatory Visit: Payer: Medicare Other | Admitting: Physical Therapy

## 2017-10-27 DIAGNOSIS — M6281 Muscle weakness (generalized): Secondary | ICD-10-CM

## 2017-10-27 DIAGNOSIS — R2689 Other abnormalities of gait and mobility: Secondary | ICD-10-CM

## 2017-10-27 DIAGNOSIS — I69351 Hemiplegia and hemiparesis following cerebral infarction affecting right dominant side: Secondary | ICD-10-CM | POA: Diagnosis not present

## 2017-10-27 DIAGNOSIS — R2681 Unsteadiness on feet: Secondary | ICD-10-CM

## 2017-10-27 NOTE — Patient Instructions (Signed)
Functional Quadriceps: Sit to Stand    Sit on edge of chair, feet flat on floor. Stand upright, extending knees fully. Look straight ahead.  Repeat __10__ times per set. Do __1__ sets per session. Do __2__ sessions per day.    Hip Side Kick    Holding a chair or the counter for balance, keep legs shoulder width apart and toes pointed forward. Lift leg out to side, keeping knee straight. Do not lean. Hold 3 seconds. Repeat using other leg. Repeat __20__ times. Do __1__ sessions per day.    Hip Backward Kick    Using a chair or the kitchen counter for balance, keep legs shoulder width apart and toes pointed forward. Slowly lift one leg back, keeping knee straight. Do not lean forward. Hold for 3 seconds. Repeat with other leg. Repeat __20_ times. Do __1__ sessions per day.

## 2017-10-28 ENCOUNTER — Ambulatory Visit
Admission: RE | Admit: 2017-10-28 | Discharge: 2017-10-28 | Disposition: A | Discharge status: Home / Self Care | Payer: Medicare Other | Source: Ambulatory Visit | Attending: Unknown Physician Specialty | Admitting: Unknown Physician Specialty

## 2017-10-28 DIAGNOSIS — K116 Mucocele of salivary gland: Secondary | ICD-10-CM

## 2017-10-28 DIAGNOSIS — K118 Other diseases of salivary glands: Secondary | ICD-10-CM | POA: Diagnosis not present

## 2017-10-28 DIAGNOSIS — J392 Other diseases of pharynx: Secondary | ICD-10-CM | POA: Diagnosis not present

## 2017-10-28 LAB — POCT I-STAT CREATININE: Creatinine, Ser: 1.3 mg/dL — ABNORMAL HIGH (ref 0.61–1.24)

## 2017-10-28 MED ORDER — GADOBENATE DIMEGLUMINE 529 MG/ML IV SOLN
14.0000 mL | Freq: Once | INTRAVENOUS | Status: AC | PRN
  Administered 2017-10-28: 14 mL via INTRAVENOUS

## 2017-10-28 NOTE — Therapy (Signed)
[facility] [address] [place], [place], [address] Phone: [phone]   Fax:  [phone]  Physical Therapy Treatment  Patient Details  Name: Anthony Allen MRN: [id number] Date of Birth: [birth year] Referring Provider: Venancio Poisson   Encounter Date: 10/27/2017  PT End of Session - 10/27/17 1308    Visit Number  2    Number of Visits  17    Date for PT Re-Evaluation  12/23/17    Authorization Type  [employer] Medicare    PT Start Time  1149    PT Stop Time  1232    PT Time Calculation (min)  43 min    Equipment Utilized During Treatment  Gait belt    Activity Tolerance  Patient tolerated treatment well    Behavior During Therapy  [facility] for tasks assessed/performed       Past Medical History:  Diagnosis Date  . Arthritis    "mild in my hands" (06/04/2017)  . Complication of anesthesia    "was told never to use Propofol because of parent's adverse reaction" (06/04/2017)  . CVA (cerebral vascular accident) ([place])    hospitalized from 12/22-12/24 due to left-sided weakness, slurred speech and difficulty swallowing. He was diagnosed as left pontine stroke./notes 06/03/2017  . Family history of adverse reaction to anesthesia    "mother and dad had allergic reaction Propofol" (06/04/2017)  . Glaucoma, both eyes   . Gout   . Hypertension   . Migraine    "stopped when my wife quit smoking in ~ 1980" (06/04/2017)  . Parotid mass   . Type 2 diabetes mellitus with hyperglycemia [facility])     Past Surgical History:  Procedure Laterality Date  . TONSILLECTOMY  1943    There were no vitals filed for this visit.  Subjective Assessment - 10/27/17 1152    Subjective  No changes. Wife/pt report a big improvement over Mother's Day weekend. He has been able to lift his legs into the bed himself. He is able to walk (with RW) to bathroom at night.     Patient is accompained by:  Family member lyndia (pronounced "Linda")     Pertinent History  glaucoma    Limitations  House hold activities;Walking;Standing    Patient Stated Goals  "walk better."     Currently in Pain?  No/denies         [facility] PT Assessment - 10/27/17 0001      Standardized Balance Assessment   Standardized Balance Assessment  Berg Balance Test      Berg Balance Test   Sit to Stand  Able to stand  independently using hands    Standing Unsupported  Able to stand safely 2 minutes    Sitting with Back Unsupported but Feet Supported on Floor or Stool  Able to sit safely and securely 2 minutes    Stand to Sit  Sits safely with minimal use of hands    Transfers  Able to transfer safely, minor use of hands    Standing Unsupported with Eyes Closed  Able to stand 10 seconds with supervision    Standing Ubsupported with Feet Together  Able to place feet together independently and stand 1 minute safely    From Standing, Reach Forward with Outstretched Arm  Can reach forward >12 cm safely (5")    From Standing Position, Pick up Object from Floor  Able to pick up shoe, needs supervision    From Standing Position, Turn to Look Behind Over  each Shoulder  Looks behind one side only/other side shows less weight shift    Turn 360 Degrees  Needs close supervision or verbal cueing    Standing Unsupported, Alternately Place Feet on Step/Stool  Needs assistance to keep from falling or unable to try    Standing Unsupported, One Foot in Front  Able to plae foot ahead of the other independently and hold 30 seconds    Standing on One Leg  Able to lift leg independently and hold equal to or more than 3 seconds    Total Score  41       Treatment-  Educated in proper technique for the following exercises for HEP:  Functional Quadriceps: Sit to Stand    Sit on edge of chair, feet flat on floor. Stand upright, extending knees fully. Look straight ahead.  Repeat __10__ times per set. Do __1__ sets per session. Do __2__ sessions per day.    Hip Side  Kick    Holding a chair or the counter for balance, keep legs shoulder width apart and toes pointed forward. Lift leg out to side, keeping knee straight. Do not lean. Hold 3 seconds. Repeat using other leg. Repeat __20__ times. Do __1__ sessions per day.    Hip Backward Kick    Using a chair or the kitchen counter for balance, keep legs shoulder width apart and toes pointed forward. Slowly lift one leg back, keeping knee straight. Do not lean forward. Hold for 3 seconds. Repeat with other leg. Repeat __20_ times. Do __1__ sessions per day.                    PT Education - 10/27/17 1307    Education Details  see HEP in pt instructions    Person(s) Educated  Patient;Spouse    Methods  Explanation;Demonstration;Handout;Verbal cues    Comprehension  Verbalized understanding;Returned demonstration;Verbal cues required;Need further instruction       PT Short Term Goals - 10/24/17 1250      PT SHORT TERM GOAL #1   Title  Pt/wife will initiate HEP in order to indicate improved functional mobility (Target date: 11/23/17)    Time  4    Period  Weeks    Status  New    Target Date  11/23/17      PT SHORT TERM GOAL #2   Title  Pt will improve TUG to </=33 secs w/ LRAD at S level in order to indicate decreased fall risk.      Time  4    Period  Weeks    Status  New      PT SHORT TERM GOAL #3   Title  Will assess BERG and improve score by 3 points from baseline in order to indicate decreased fall risk.     Time  4    Period  Weeks    Status  New      PT SHORT TERM GOAL #4   Title  Pt will improve 5TSS to </= 21 secs with single UE support only in order to indicate decreased fall risk and improved functional strength.      Time  4    Period  Weeks    Status  New      PT SHORT TERM GOAL #5   Title  Pt will improve gait speed to 1.94 ft/sec w/ LRAD at S level in order to indicate decreased fall risk.      Time  4  Period  Weeks    Status  New      Additional  Short Term Goals   Additional Short Term Goals  Yes      PT SHORT TERM GOAL #6   Title  Pt will ambulate short household distances (up to 50') w/ quad cane at S level in order to indicate improved independence at home.     Time  4    Period  Weeks    Status  New        PT Long Term Goals - 10/24/17 1256      PT LONG TERM GOAL #1   Title  Pt/wife will be independent with final HEP in order to indicate improved functional mobility and decreased fall risk. (Target Date: 12/23/17)    Time  8    Period  Weeks    Status  New    Target Date  12/23/17      PT LONG TERM GOAL #2   Title  Pt will improve BERG balance score by 6 points from baseline in order to indicate decreased fall risk.     Time  8    Period  Weeks    Status  New      PT LONG TERM GOAL #3   Title  Pt will improve TUG time to </=30 secs w/ LRAD at mod I level in order to indicate decreased fall risk.      Time  8    Period  Weeks    Status  New      PT LONG TERM GOAL #4   Title  Pt will improve 5TSS to </=19 secs without UE support in order to indicate improved functional strength and decreased fall risk.      Time  8    Period  Weeks    Status  New      PT LONG TERM GOAL #5   Title  Pt will ambulate x 500' w/ LRAD over indoor surfaces at mod I level in order to indicate improved functional mobility.      Time  8    Period  Weeks    Status  New      Additional Long Term Goals   Additional Long Term Goals  Yes      PT LONG TERM GOAL #6   Title  Pt will ambulate x 300' over unlevel paved outdoor surfaces w/ LRAD at S level in order to indicate improved community negotiation.      Time  8    Period  Weeks    Status  New            Plan - 10/28/17 0732    Clinical Impression Statement  Initial portion of session completed Berg with pt scoring 41/56 (inidicates high fall risk). Initiated HEP with standing hip exercises (for strengthening and balance training). Pt demonstrated good safety awareness with  taking time to properly set up environment/transport chair prior to initiating mobility. Patient demonstrates good motivation and should continue to make progress with continued PT.     Rehab Potential  Good    Clinical Impairments Affecting Rehab Potential  Pt very motivated     PT Frequency  2x / week    PT Duration  8 weeks    PT Treatment/Interventions  ADLs/Self Care Home Management;Electrical Stimulation;DME Instruction;Gait training;Stair training;Functional mobility training;Therapeutic activities;Therapeutic exercise;Balance training;Neuromuscular re-education;Patient/family education;Orthotic Fit/Training;Passive range of motion;Vestibular    PT Next Visit Plan  check technique for HEP  given 5/20 and add to HEP for LE strengthening/R NMR, balance, gait with LRAD (has used quad cane at home with HHPT but Merrilee Jansky 41/56 & recommend cont RW at home).  watch to see if we need to resume AFO    Consulted and Agree with Plan of Care  Patient;Family member/caregiver    Family Member Consulted  wife Baldwin [place] (goes by [employer])       Patient will benefit from skilled therapeutic intervention in order to improve the following deficits and impairments:  Abnormal gait, Decreased activity tolerance, Decreased balance, Decreased coordination, Decreased endurance, Decreased knowledge of use of DME, Decreased mobility, Decreased range of motion, Decreased strength, Impaired perceived functional ability, Impaired flexibility, Improper body mechanics, Postural dysfunction, Impaired UE functional use  Visit Diagnosis: Muscle weakness (generalized)  Unsteadiness on feet  Other abnormalities of gait and mobility     Problem List Patient Active Problem List   Diagnosis Date Noted  . Diastolic dysfunction   . Dysphagia, post-stroke   . CKD (chronic kidney disease), stage III ([place]) 06/03/2017  . 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 Stroke (cerebrum) ([place]) 06/03/2017  . CVA (cerebral vascular accident)  ([place]) 06/01/2017  . TIA (transient ischemic attack) 05/31/2017  . Fatigue 05/08/2017  . Aortic atherosclerosis ([place]) 05/08/2017  . Glaucoma 05/06/2017  . Lipoma of forehead 11/02/2016  . Type II diabetes mellitus with renal manifestations ([place]) 11/02/2016  . Hyperlipidemia LDL goal <100 11/02/2016  . Constipation in male 11/02/2016  . Insomnia due to anxiety and fear 11/02/2016  . Colon cancer screening 05/04/2015  . Vitamin D deficiency 05/02/2015  . Medicare annual wellness visit, subsequent 06/23/2013  . Essential hypertension, benign 03/22/2013  . Osteoarthritis 03/22/2013  . Numbness and tingling in left hand 03/22/2013  . GERD (gastroesophageal reflux disease) 03/22/2013  . BPH (benign prostatic hyperplasia) 03/22/2013    Rexanne Mano, PT 10/28/2017, 7:48 AM  [facility] [address] [place], [place], [address] Phone: [phone]   Fax:  [phone]  Name: Anthony Allen MRN: [id number] Date of Birth: 07-29-1935

## 2017-10-31 ENCOUNTER — Encounter: Payer: Self-pay | Admitting: Internal Medicine

## 2017-10-31 ENCOUNTER — Ambulatory Visit: Payer: Medicare Other | Admitting: Rehabilitation

## 2017-10-31 ENCOUNTER — Encounter: Payer: Self-pay | Admitting: Rehabilitation

## 2017-10-31 ENCOUNTER — Ambulatory Visit: Payer: Medicare Other | Admitting: Internal Medicine

## 2017-10-31 ENCOUNTER — Encounter: Payer: Self-pay | Admitting: Occupational Therapy

## 2017-10-31 ENCOUNTER — Encounter

## 2017-10-31 ENCOUNTER — Ambulatory Visit: Payer: Medicare Other | Admitting: Occupational Therapy

## 2017-10-31 VITALS — BP 110/70 | HR 95 | Temp 97.8°F | Resp 15 | Ht 69.0 in | Wt 135.1 lb

## 2017-10-31 DIAGNOSIS — I69351 Hemiplegia and hemiparesis following cerebral infarction affecting right dominant side: Secondary | ICD-10-CM

## 2017-10-31 DIAGNOSIS — R2681 Unsteadiness on feet: Secondary | ICD-10-CM

## 2017-10-31 DIAGNOSIS — I69315 Cognitive social or emotional deficit following cerebral infarction: Secondary | ICD-10-CM

## 2017-10-31 DIAGNOSIS — E785 Hyperlipidemia, unspecified: Secondary | ICD-10-CM

## 2017-10-31 DIAGNOSIS — M6281 Muscle weakness (generalized): Secondary | ICD-10-CM

## 2017-10-31 DIAGNOSIS — E559 Vitamin D deficiency, unspecified: Secondary | ICD-10-CM | POA: Diagnosis not present

## 2017-10-31 DIAGNOSIS — F5105 Insomnia due to other mental disorder: Secondary | ICD-10-CM

## 2017-10-31 DIAGNOSIS — I10 Essential (primary) hypertension: Secondary | ICD-10-CM

## 2017-10-31 DIAGNOSIS — F409 Phobic anxiety disorder, unspecified: Secondary | ICD-10-CM

## 2017-10-31 DIAGNOSIS — B3749 Other urogenital candidiasis: Secondary | ICD-10-CM

## 2017-10-31 DIAGNOSIS — M25641 Stiffness of right hand, not elsewhere classified: Secondary | ICD-10-CM

## 2017-10-31 DIAGNOSIS — R221 Localized swelling, mass and lump, neck: Secondary | ICD-10-CM | POA: Diagnosis not present

## 2017-10-31 DIAGNOSIS — E1129 Type 2 diabetes mellitus with other diabetic kidney complication: Secondary | ICD-10-CM | POA: Diagnosis not present

## 2017-10-31 DIAGNOSIS — M7918 Myalgia, other site: Secondary | ICD-10-CM

## 2017-10-31 DIAGNOSIS — R350 Frequency of micturition: Secondary | ICD-10-CM | POA: Diagnosis not present

## 2017-10-31 DIAGNOSIS — M25631 Stiffness of right wrist, not elsewhere classified: Secondary | ICD-10-CM

## 2017-10-31 DIAGNOSIS — R634 Abnormal weight loss: Secondary | ICD-10-CM

## 2017-10-31 DIAGNOSIS — F4321 Adjustment disorder with depressed mood: Secondary | ICD-10-CM | POA: Diagnosis not present

## 2017-10-31 DIAGNOSIS — R41842 Visuospatial deficit: Secondary | ICD-10-CM

## 2017-10-31 DIAGNOSIS — M25611 Stiffness of right shoulder, not elsewhere classified: Secondary | ICD-10-CM

## 2017-10-31 DIAGNOSIS — R2689 Other abnormalities of gait and mobility: Secondary | ICD-10-CM

## 2017-10-31 DIAGNOSIS — R600 Localized edema: Secondary | ICD-10-CM

## 2017-10-31 DIAGNOSIS — R293 Abnormal posture: Secondary | ICD-10-CM

## 2017-10-31 DIAGNOSIS — I63212 Cerebral infarction due to unspecified occlusion or stenosis of left vertebral arteries: Secondary | ICD-10-CM

## 2017-10-31 MED ORDER — MIRTAZAPINE 7.5 MG PO TABS: 7.5000 mg | ORAL_TABLET | Freq: Every day | ORAL | 1 refills | Status: DC

## 2017-10-31 MED ORDER — GABAPENTIN 100 MG PO CAPS: 100.0000 mg | ORAL_CAPSULE | Freq: Three times a day (TID) | ORAL | 3 refills | Status: DC

## 2017-10-31 MED ORDER — ROSUVASTATIN CALCIUM 10 MG PO TABS: 10.0000 mg | ORAL_TABLET | Freq: Every day | ORAL | 0 refills | Status: DC

## 2017-10-31 MED ORDER — NYSTATIN 100000 UNIT/GM EX POWD: Freq: Two times a day (BID) | CUTANEOUS | 2 refills | Status: DC

## 2017-10-31 NOTE — Patient Instructions (Addendum)
Check with your doctor about using Patanol for the  Eyes  For your allergies ,  You can use Benadryl but you should also consider adding one of these newer second generation antihistamines that are longer acting, non sedating and  available OTC:  Generic  Zyrtec, which is cetirizine.    generic Allegra , available generically as fexofenadine ; comes in 60 mg and 180 mg once daily strengths.    Generic Claritin :  also available as loratidine .    Try using Chin strap to keep mouth closed at night  (try Ollie's )   Check with pharmacist about the losartan you are taking.  You may need to take the pill to the drugstore to identify the lot.     We are Starting an antidepressant called Remeron (generically available as  Mirtazipine)  at 7.5 mg daily at bedtime for insomnia, fatigue. Depression and poor appetite  We will increase dose after 2 weeks to 15 mg   Referral to our therapist, Alene Mires.    I have refilled the powder for yeast infection   Trial of gabapentin for the neuropathy  100 mg up to 3 times daily  Reduce the crestor rosuvastatin to 10 mg daily  RTC to see me in one month   No labs needed until mid July

## 2017-10-31 NOTE — Progress Notes (Signed)
Subjective:  Patient ID: Anthony Allen, male    DOB: 1935/08/02  Age: 82 y.o. MRN: [id number]  CC: The primary encounter diagnosis was Adjustment disorder with depressed mood. 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  HPI Anthony Allen presents for follow up on mulltiple issues.  1) HTN  2) Type 2 DM:   Patient has no complaints today.  Patient is following a low glycemic index diet and taking all prescribed medications regularly without side effects.  Fasting sugars have been under less than 140 most of the time and post prandials have been under 160 except on rare occasions. Patient is exercising about 3 times per week and intentionally trying to lose weight .  Patient has had an eye exam in the last 12 months and checks feet regularly for signs of infection.  Patient does not walk barefoot outside,  Patient is up to date on all recommended vaccinations  3)  recent left pontine  CVA with right sided weakness. 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 MRI considered unchanged.  Plan:  follow up in 6 months is advised. By Dr Tami Ribas in consultation with [facility] colleagues.  Old  right brain, and left pontine Infarcts.   5) left eye subconjunctival hemorrhage occurred on Easter,  ER visit .  Pressure checked on eyes due to history of glaucoma.  14 .  6) right sided weakness has left him frustrated. Still losing weight . Wants  to see therapist,  Starting remoren  7) Has pain in right upper arm and pain wants to see massage  therapist  If covered by insurance.  currently  seeing PT and OT   8)  scrotal rash: persistent .  Area stays moist.    Lab Results  Component Value Date   HGBA1C 5.7 09/01/2017     Outpatient Medications Prior to Visit  Medication Sig Dispense Refill  . ALPRAZolam (XANAX) 0.5 MG tablet 30TK 1 T PO Q NIGHT SHIFT FOR ANXIETY / INSOMNIA 30 tablet 5  . Cholecalciferol (VITAMIN D-3) 1000 units CAPS Take 1,000 Units by mouth daily.    . clopidogrel (PLAVIX) 75 MG tablet Take 1 tablet (75 mg total) by mouth daily. 30 tablet 2  . empagliflozin (JARDIANCE) 10 MG TABS tablet Take 10 mg by mouth daily. 30 tablet 3  . fluticasone (FLONASE) 50 MCG/ACT nasal spray Place 2 sprays into both nostrils daily as needed for allergies or rhinitis.     Marland Kitchen HYDROCORTISONE, TOPICAL, 2 % LOTN Apply 1 application topically as needed (itching).    Marland Kitchen latanoprost (XALATAN) 0.005 % ophthalmic solution Instill 1 drop into both eyes in the evening  0  . losartan (COZAAR) 50 MG tablet Take 1 tablet (50 mg total) by mouth daily. 90 tablet 3  . timolol (TIMOPTIC) 0.5 % ophthalmic solution Instill 1 drop into the right eye in the morning  0  . nystatin (NYSTATIN) powder Apply topically 2 (two) times daily. 30 g 2  . rosuvastatin (CRESTOR) 20 MG tablet Take 1 tablet (20 mg total) by mouth  daily. 30 tablet 1  . aspirin EC 81 MG tablet Take 1 tablet (81 mg total) by mouth daily. (Patient not taking: Reported on 10/24/2017)    . glucose blood test strip Use as instructed (Patient not taking: Reported on 10/24/2017) 100 each 5  . Lancets (ONETOUCH ULTRASOFT) lancets Use as instructed (Patient not taking: Reported on 10/24/2017) 100 each 5  . trimethoprim-polymyxin b (POLYTRIM) ophthalmic solution Place 1 drop into the left eye every 4 (four) hours. (Patient not taking: Reported on 10/31/2017) 10 mL 0   No facility-administered medications prior to visit.     Review of Systems;  Patient denies headache, fevers, malaise,  unintentional weight loss, skin rash, eye pain, sinus congestion and sinus pain, sore throat, dysphagia,  hemoptysis , cough, dyspnea, wheezing, chest pain, palpitations, orthopnea, edema, abdominal pain, nausea, melena, diarrhea, constipation, flank pain, dysuria, hematuria, urinary  Frequency, nocturia, numbness, tingling, seizures,  Focal weakness, Loss of consciousness,  Tremor, insomnia, depression, anxiety, and suicidal ideation.      Objective:  BP 110/70 (BP Location: Left Arm, Patient Position: Sitting, Cuff Size: Normal)   Pulse 95   Temp 97.8 F (36.6 C) (Oral)   Resp 15   Ht 5\' 9"  (1.753 m)   Wt 135 lb 1.9 oz (61.3 kg)   SpO2 97%   BMI 19.95 kg/m   BP Readings from Last 3 Encounters:  10/31/17 110/70  10/24/17 104/66  09/28/17 132/77    Wt Readings from Last 3 Encounters:  10/31/17 135 lb 1.9 oz (61.3 kg)  09/28/17 143 lb (64.9 kg)  08/11/17 143 lb (64.9 kg)    General appearance: alert, cooperative and appears stated age Ears: normal TM's and external ear canals both ears Throat: lips, mucosa, and tongue normal; teeth and gums normal Neck: no adenopathy, no carotid bruit, supple, symmetrical, trachea midline and thyroid not enlarged, symmetric, no tenderness/mass/nodules Back: symmetric, no curvature. ROM normal. No CVA tenderness. Lungs: clear to auscultation bilaterally Heart: regular rate and rhythm, S1, S2 normal, no murmur, click, rub or gallop Abdomen: soft, non-tender; bowel sounds normal; no masses,  no organomegaly Pulses: 2+ and symmetric Skin: Skin color, texture, turgor normal. No rashes or lesions Lymph nodes: Cervical, supraclavicular, and axillary nodes normal.  Lab Results  Component Value Date   HGBA1C 5.7 09/01/2017   HGBA1C 6.6 (H) 06/01/2017   HGBA1C 6.9 (H) 05/06/2017    Lab Results  Component Value Date   CREATININE 1.30 (H) 10/28/2017   CREATININE 1.26 09/01/2017   CREATININE 1.60 (H) 06/03/2017    Lab Results  Component Value  Date   WBC 8.9 06/03/2017   HGB 18.0 (H) 06/03/2017   HCT 53.0 (H) 06/03/2017   PLT 197 06/03/2017   GLUCOSE 203 (H) 09/01/2017   CHOL 79 09/01/2017   TRIG 162.0 (H) 09/01/2017   HDL 29.80 (L) 09/01/2017   LDLDIRECT 117.0 05/06/2017   LDLCALC 16 09/01/2017   ALT 51 09/01/2017   AST 28 09/01/2017   NA 136 09/01/2017   K 4.2 09/01/2017   CL 101 09/01/2017   CREATININE 1.30 (H) 10/28/2017   BUN 27 (H) 09/01/2017   CO2 28 09/01/2017   TSH 1.561 06/01/2017   PSA 1.52 06/23/2013   INR 1.05 06/03/2017   HGBA1C 5.7 09/01/2017   MICROALBUR 5.3 (H) 05/06/2017    Mr Face/trigeminal Wo/w Cm  Result Date: 10/28/2017 CLINICAL DATA:  Left parapharyngeal mass. EXAM: MRI FACE TRIGEMINAL WITHOUT AND WITH CONTRAST TECHNIQUE: Multiplanar, multiecho pulse sequences of the face  and surrounding structures, including thin slice imaging of the course of the Trigeminal Nerves, were obtained both before and after administration of intravenous contrast. CONTRAST:  63mL MULTIHANCE GADOBENATE DIMEGLUMINE 529 MG/ML IV SOLN COMPARISON:  Brain MRI 06/01/2017 FINDINGS: There are old infarcts of the right corona radiata and left pons. The visualized intracranial contents are otherwise unremarkable. 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 There is no extension into the skull base. The lesion measures approximately 4.0 x 0.7 x 2.3 cm. There is no other skull base abnormality. Dedicated trigeminal nerve imaging was also performed. There is no focal abnormality along the trigeminal nerves. Meckel's cave is patent bilaterally. No abnormality at the foramina rotundum or ovale. IMPRESSION: 1. 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 Histologic sampling should be considered. 2. No extension through the skull base. Normal appearance of the visible cranial nerves. 3. 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 Electronically Signed   By: Ulyses Jarred M.D.   On: 10/28/2017 15:44    Assessment & Plan:   Problem List Items Addressed This Visit    Vitamin D deficiency   Type II diabetes mellitus with renal manifestations (HCC)   Relevant Medications   rosuvastatin (CRESTOR) 10 MG tablet   Other Relevant Orders   Hemoglobin A1c   Comprehensive metabolic panel   Hyperlipidemia LDL goal <100   Relevant Medications   rosuvastatin (CRESTOR) 10 MG tablet   Other Relevant Orders   Lipid panel   Weight loss, unintentional    Starting remeron for appetite stimulation,  mamangement of insomnia and low energy .  75 mg starting dose,  Increase to 15 mg in 2 weeks.    rrtc one month       Parapharyngeal space mass    4 cm parotid mass,  Unchanged by repeat imaging.  ENT has evaluated patient;  No intervention planned      Insomnia due to anxiety and fear    Starting remeron      Essential hypertension, benign    Well controlled on current regimen. Renal function stable, no changes today. Lab Results  Component Value Date   CREATININE 1.30 (H) 10/28/2017   Lab Results  Component Value Date   NA 136 09/01/2017   K 4.2 09/01/2017   CL 101 09/01/2017   CO2 28 09/01/2017         Relevant Medications   rosuvastatin (CRESTOR) 10 MG tablet   CVA (cerebral vascular accident) ([place])    Left pontine,  With rigth sided weakness responding  to PT/OT      Relevant Medications   rosuvastatin (CRESTOR) 10 MG tablet   Candidiasis of scrotum    Continue nystatin bid and increase agitation      Relevant Medications   nystatin (NYSTATIN) powder    Other Visit Diagnoses    Adjustment disorder with depressed mood    -  Primary   Relevant  Orders   Ambulatory referral to Psychology   Urinary frequency       Relevant Orders   Urinalysis, Routine w reflex microscopic   Urine Culture      I have discontinued Anthony Allen's glucose blood,  onetouch ultrasoft, aspirin EC, and trimethoprim-polymyxin b. I have also changed his rosuvastatin and nystatin. Additionally, I am having him start on mirtazapine and gabapentin. Lastly, I am having him maintain his HYDROCORTISONE (TOPICAL), latanoprost, timolol, fluticasone, empagliflozin, losartan, Vitamin D-3, ALPRAZolam, and clopidogrel.  Meds ordered this encounter  Medications  . mirtazapine (REMERON) 7.5 MG tablet    Sig: Take 1 tablet (7.5 mg total) by mouth at bedtime.    Dispense:  90 tablet    Refill:  1  . rosuvastatin (CRESTOR) 10 MG tablet    Sig: Take 1 tablet (10 mg total) by mouth daily.    Dispense:  90 tablet    Refill:  0  . nystatin (NYSTATIN) powder    Sig: Apply topically 2 (two) times daily. As needed for rash    Dispense:  60 g    Refill:  2  . gabapentin (NEURONTIN) 100 MG capsule    Sig: Take 1 capsule (100 mg total) by mouth 3 (three) times daily.    Dispense:  90 capsule    Refill:  3  A total of 40 minutes was spent with patient more than half of which was spent in counseling patient on the above mentioned issues , reviewing and explaining recent labs and imaging studies done, and coordination of care.   Medications Discontinued During This Encounter  Medication Reason  . aspirin EC 81 MG tablet Completed Course  . glucose blood test strip Patient has not taken in last 30 days  . Lancets (ONETOUCH ULTRASOFT) lancets Patient has not taken in last 30 days  . trimethoprim-polymyxin b (POLYTRIM) ophthalmic solution Completed Course  . rosuvastatin (CRESTOR) 20 MG tablet   . nystatin (NYSTATIN) powder     Follow-up: Return in about 1 month (around 11/28/2017) for follow up diabetes.   Crecencio Mc, MD

## 2017-10-31 NOTE — Therapy (Signed)
[facility] [address] [place], [place], [address] Phone: [phone]   Fax:  [phone]  Occupational Therapy Treatment  Patient Details  Name: Anthony Allen MRN: [id number] Date of Birth: 1935/06/17 Referring Provider: Venancio Poisson   Encounter Date: 10/31/2017  OT End of Session - 10/31/17 1337    Visit Number  2    Number of Visits  24    Date for OT Re-Evaluation  01/22/18    Authorization Type  [employer] Medicare.  $20 copay each OT/PT    Authorization - Visit Number  2    Authorization - Number of Visits  10    OT Start Time  1103    OT Stop Time  1145    OT Time Calculation (min)  42 min    Activity Tolerance  Patient tolerated treatment well    Behavior During Therapy  [facility] for tasks assessed/performed       Past Medical History:  Diagnosis Date  . Arthritis    "mild in my hands" (06/04/2017)  . Complication of anesthesia    "was told never to use Propofol because of parent's adverse reaction" (06/04/2017)  . CVA (cerebral vascular accident) ([place])    hospitalized from 12/22-12/24 due to left-sided weakness, slurred speech and difficulty swallowing. He was diagnosed as left pontine stroke./notes 06/03/2017  . Family history of adverse reaction to anesthesia    "mother and dad had allergic reaction Propofol" (06/04/2017)  . Glaucoma, both eyes   . Gout   . Hypertension   . Migraine    "stopped when my wife quit smoking in ~ 1980" (06/04/2017)  . Parotid mass   . Type 2 diabetes mellitus with hyperglycemia [facility])     Past Surgical History:  Procedure Laterality Date  . TONSILLECTOMY  1943    There were no vitals filed for this visit.  Subjective Assessment - 10/31/17 1107    Subjective   Patient indicates he has a cyst that they are watching - on his cheek    Patient is accompained by:  Family member    Currently in Pain?  No/denies    Multiple Pain Sites  No                    OT Treatments/Exercises (OP) - 10/31/17 0001      ADLs   ADL Comments  Reviewed OT goals and plan of care.  Patient and wife in agreement.         Neurological Re-education Exercises   Other Exercises 1  sitting than supine to asses sshoulder biomechanics.  Patient with painful flexion beyond 80 degrees - flex and 30 degrees abduction.  Patient with very tight bicep at proximal and distal end, limiting forearm rotation, and humeral head motion with arm movement.  Worked to increase passive motion in right upper extremity,a nd attempted wherever possible to have patien tfollow with active movement.        Modalities   Modalities  Ultrasound      Ultrasound   Ultrasound Location  right hand, digits, forearm    Ultrasound Parameters  3.58mhz, 20%, continuous, 8 min, 0.8 w/cm2    Ultrasound Goals  Edema range of motion              OT Education - 10/31/17 1336    Education provided  Yes    Education Details  goals of OT     Person(s) Educated  Patient;Spouse  Methods  Explanation    Comprehension  Verbalized understanding       OT Short Term Goals - 10/31/17 1338      OT SHORT TERM GOAL #1   Title  Patient will tshirt with set up assistance due 11/23/17    Status  On-going      OT SHORT TERM GOAL #2   Title  Patient will don socks with min assist    Status  On-going      OT SHORT TERM GOAL #3   Title  Patient will don pants with min assistance    Status  On-going      OT SHORT TERM GOAL #4   Title  Patient will complete HEP designed to improve shoulder range of motion with min assist and cueing    Status  On-going        OT Long Term Goals - 10/31/17 1339      OT LONG TERM GOAL #1   Title  Patient will dress himself with only occasional min assistance    Status  On-going      OT LONG TERM GOAL #2   Title  Patient will transfer into and out of shower with supervision      OT LONG TERM GOAL #3   Title  Patient will feed himself,  and cut up his own food with set up assistance    Status  On-going      OT LONG TERM GOAL #4   Title  Patient will demonstrate at keast 5 degrees additional PIP flexion in digits 2-5 of right hand to aide in grasp and pinch use.      Status  On-going      OT LONG TERM GOAL #5   Title  Patient will demonstrate active supination at least 10 degrees beyond neutral to aide in orienting hand to object needed for functional task    Status  On-going      OT LONG TERM GOAL #6   Title  Patient will demonstrate active low reach in right arm to obtain lightweight (1 lb or less)  item from countertop or tabletop if standing    Status  On-going      OT LONG TERM GOAL #7   Title  Patient will demonstrate at leaast 90 degrees of passive shoulder flexion or abduction with pain no greater than 2/10 to aide with hygiene and dressing tasks    Status  On-going            Plan - 10/31/17 1337    Clinical Impression Statement  Patient is in agreement with OT goals and appears very willing to work through tension and pain to improve right UE motion and function.      Occupational Profile and client history currently impacting functional performance  Husband, father - son with significant psychiatric dx- currently stable on medication, retired Quarry manager, Financial trader, Geophysicist/field seismologist, and Insurance claims handler performance deficits (Please refer to evaluation for details):  ADL's;IADL's;Rest and Sleep;Leisure;Social Participation    Rehab Potential  Fair    Current Impairments/barriers affecting progress:  concern for RSD right UE     OT Frequency  2x / week    OT Duration  12 weeks    OT Treatment/Interventions  Self-care/ADL training;Electrical Stimulation;Therapeutic exercise;Visual/perceptual remediation/compensation;Patient/family education;Splinting;Neuromuscular education;Paraffin;Moist Heat;Aquatic Therapy;Traction;Fluidtherapy;Functional Mobility Training;Scar  mobilization;Therapeutic activities;Balance training;Energy conservation;Cognitive remediation/compensation;Passive range of motion;Manual Therapy;DME and/or AE instruction;Contrast Bath;Ultrasound;Cryotherapy    Plan  Need to establish HEP - Passive program  for RUE.  Consider flexion glove or splint    Clinical Decision Making  Multiple treatment options, significant modification of task necessary    Consulted and Agree with Plan of Care  Patient;Family member/caregiver    Family Member Consulted  wife - [name]       Patient will benefit from skilled therapeutic intervention in order to improve the following deficits and impairments:  Decreased cognition, Decreased knowledge of use of DME, Decreased skin integrity, Increased edema, Impaired flexibility, Impaired vision/preception, Pain, Abnormal gait, Cardiopulmonary status limiting activity, Decreased coordination, Decreased mobility, Increased fascial restrictions, Impaired sensation, Improper body mechanics, Decreased activity tolerance, Decreased endurance, Decreased range of motion, Decreased strength, Impaired tone, Improper spinal/pelvic alignment, Decreased balance, Decreased knowledge of precautions, Decreased safety awareness, Difficulty walking, Impaired perceived functional ability, Impaired UE functional use  Visit Diagnosis: Stiffness of right shoulder, not elsewhere classified  Stiffness of right wrist, not elsewhere classified  Stiffness of right hand, not elsewhere classified  Visuospatial deficit  Localized edema  Cognitive social or emotional deficit following cerebral infarction  Hemiplegia and hemiparesis following cerebral infarction affecting right dominant side (HCC)  Unsteadiness on feet  Muscle weakness (generalized)  Abnormal posture    Problem List Patient Active Problem List   Diagnosis Date Noted  . Diastolic dysfunction   . Dysphagia, post-stroke   . CKD (chronic kidney disease), stage III ([place])  06/03/2017  . 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 Stroke (cerebrum) ([place]) 06/03/2017  . CVA (cerebral vascular accident) ([place]) 06/01/2017  . TIA (transient ischemic attack) 05/31/2017  . Fatigue 05/08/2017  . Aortic atherosclerosis ([place]) 05/08/2017  . Glaucoma 05/06/2017  . Lipoma of forehead 11/02/2016  . Type II diabetes mellitus with renal manifestations ([place]) 11/02/2016  . Hyperlipidemia LDL goal <100 11/02/2016  . Constipation in male 11/02/2016  . Insomnia due to anxiety and fear 11/02/2016  . Colon cancer screening 05/04/2015  . Vitamin D deficiency 05/02/2015  . Medicare annual wellness visit, subsequent 06/23/2013  . Essential hypertension, benign 03/22/2013  . Osteoarthritis 03/22/2013  . Numbness and tingling in left hand 03/22/2013  . GERD (gastroesophageal reflux disease) 03/22/2013  . BPH (benign prostatic hyperplasia) 03/22/2013    Mariah Milling, OTR/L 10/31/2017, 1:40 PM  [facility] [address] [place], [place], [address] Phone: [phone]   Fax:  [phone]  Name: Anthony Allen MRN: [id number] Date of Birth: 10/15/1935

## 2017-10-31 NOTE — Therapy (Signed)
[facility] [address] [place], [place], [address] Phone: [phone]   Fax:  [phone]  Physical Therapy Treatment  Patient Details  Name: Anthony Allen MRN: [id number] Date of Birth: 05-22-1936 Referring Provider: Venancio Poisson   Encounter Date: 10/31/2017  PT End of Session - 10/31/17 1328    Visit Number  3    Number of Visits  17    Date for PT Re-Evaluation  12/23/17    Authorization Type  UHC Medicare    PT Start Time  1149    PT Stop Time  1235    PT Time Calculation (min)  46 min    Equipment Utilized During Treatment  Gait belt    Activity Tolerance  Patient tolerated treatment well    Behavior During Therapy  WFL for tasks assessed/performed       Past Medical History:  Diagnosis Date  . Arthritis    "mild in my hands" (06/04/2017)  . Complication of anesthesia    "was told never to use Propofol because of parent's adverse reaction" (06/04/2017)  . CVA (cerebral vascular accident) ([place])    hospitalized from 12/22-12/24 due to left-sided weakness, slurred speech and difficulty swallowing. He was diagnosed as left pontine stroke./notes 06/03/2017  . Family history of adverse reaction to anesthesia    "mother and dad had allergic reaction Propofol" (06/04/2017)  . Glaucoma, both eyes   . Gout   . Hypertension   . Migraine    "stopped when my wife quit smoking in ~ 1980" (06/04/2017)  . Parotid mass   . Type 2 diabetes mellitus with hyperglycemia [facility])     Past Surgical History:  Procedure Laterality Date  . TONSILLECTOMY  1943    There were no vitals filed for this visit.  Subjective Assessment - 10/31/17 1154    Subjective  Pt reports a fall about 5 days ago, reaching down for remote.  Was using RW.  No injuries.     Patient is accompained by:  Family member    Limitations  House hold activities;Walking;Standing    Patient Stated Goals  "walk better."     Currently in Pain?   No/denies                       [facility] Adult PT Treatment/Exercise - 10/31/17 0001      Ambulation/Gait   Ambulation/Gait  Yes    Ambulation/Gait Assistance  5: Supervision;4: Min guard    Ambulation/Gait Assistance Details  Continue to address quality and increasing distance of gait with RW.  Performed x 200' during session at S to min/guard level.  Educated pt on R inattention and need to scan R environment.  Pt did not have difficulty negotiating objects during session. Cues for posture, improved heel strike on RLE and increased step width.      Ambulation Distance (Feet)  200 Feet    Assistive device  Rolling walker    Gait Pattern  Step-through pattern;Decreased arm swing - right;Decreased arm swing - left;Decreased stride length;Decreased dorsiflexion - right;Decreased weight shift to right;Right foot flat;Trunk flexed;Narrow base of support    Ambulation Surface  Indoor;Level    Stairs  Yes    Stairs Assistance  4: Min guard;4: Min assist    Stairs Assistance Details (indicate cue type and reason)  Wife wanting to practice stairs during session as they will be going to friends home that has 4-5 steps with two rails to enter  home.  Had pt use L rail both when ascending and descending for safety.  PT demo'd on where wife should assist and how for improved safety.  Both verbalized understanding.  Pt with good knowledge on sequencing.        Therapeutic Activites    Therapeutic Activities  Other Therapeutic Activities    Other Therapeutic Activities  Pt/wife reports that he could sleep in bed with wife at home (he used to sleep seperately and now sleeps seperately in hospital bed).  Had wife show PT how high bed was at home and PT simulated with therapy mat in gym.  Pt able to back up to bed and scoot hip reciprically very well during session.  Provided education on attempting this at home with education to wife to stand in front of him when scooting in case there is some forward  translation.  Pt and wife verbalized understanding.       Neuro Re-ed    Neuro Re-ed Details   Reviewed HEP for RLE strengthening/NMR with sit<>stand x 10 reps with cues for technique and forward trunk lean when sitting for controlled descent.  Standing hip abd x 10 reps on each side and standing hip ext x 10 reps on each side.  Emphasis placed on R LE when in stance with improved proximal hip control.  Also cues for posture.  Performed standing marching with BUE support (on counter and on RW) x 20 reps total, again with emphasis on improved R hip control during stance.  Added to HEP.  Hooklying bridging with hip abd w/ red theraband x 10 reps with cues for slower more controlled movement.               PT Education - 10/31/17 1328    Education provided  Yes    Education Details  getting in/out of bed at home, added exercise to HEP    Person(s) Educated  Spouse;Patient    Methods  Explanation;Demonstration;Handout    Comprehension  Verbalized understanding;Returned demonstration       PT Short Term Goals - 10/24/17 1250      PT SHORT TERM GOAL #1   Title  Pt/wife will initiate HEP in order to indicate improved functional mobility (Target date: 11/23/17)    Time  4    Period  Weeks    Status  New    Target Date  11/23/17      PT SHORT TERM GOAL #2   Title  Pt will improve TUG to </=33 secs w/ LRAD at S level in order to indicate decreased fall risk.      Time  4    Period  Weeks    Status  New      PT SHORT TERM GOAL #3   Title  Will assess BERG and improve score by 3 points from baseline in order to indicate decreased fall risk.     Time  4    Period  Weeks    Status  New      PT SHORT TERM GOAL #4   Title  Pt will improve 5TSS to </= 21 secs with single UE support only in order to indicate decreased fall risk and improved functional strength.      Time  4    Period  Weeks    Status  New      PT SHORT TERM GOAL #5   Title  Pt will improve gait speed to 1.94 ft/sec w/  LRAD at  S level in order to indicate decreased fall risk.      Time  4    Period  Weeks    Status  New      Additional Short Term Goals   Additional Short Term Goals  Yes      PT SHORT TERM GOAL #6   Title  Pt will ambulate short household distances (up to 50') w/ quad cane at S level in order to indicate improved independence at home.     Time  4    Period  Weeks    Status  New        PT Long Term Goals - 10/24/17 1256      PT LONG TERM GOAL #1   Title  Pt/wife will be independent with final HEP in order to indicate improved functional mobility and decreased fall risk. (Target Date: 12/23/17)    Time  8    Period  Weeks    Status  New    Target Date  12/23/17      PT LONG TERM GOAL #2   Title  Pt will improve BERG balance score by 6 points from baseline in order to indicate decreased fall risk.     Time  8    Period  Weeks    Status  New      PT LONG TERM GOAL #3   Title  Pt will improve TUG time to </=30 secs w/ LRAD at mod I level in order to indicate decreased fall risk.      Time  8    Period  Weeks    Status  New      PT LONG TERM GOAL #4   Title  Pt will improve 5TSS to </=19 secs without UE support in order to indicate improved functional strength and decreased fall risk.      Time  8    Period  Weeks    Status  New      PT LONG TERM GOAL #5   Title  Pt will ambulate x 500' w/ LRAD over indoor surfaces at mod I level in order to indicate improved functional mobility.      Time  8    Period  Weeks    Status  New      Additional Long Term Goals   Additional Long Term Goals  Yes      PT LONG TERM GOAL #6   Title  Pt will ambulate x 300' over unlevel paved outdoor surfaces w/ LRAD at S level in order to indicate improved community negotiation.      Time  8    Period  Weeks    Status  New            Plan - 10/31/17 1329    Clinical Impression Statement  Reviewed current HEP given at last session with min cues for techniqe with PT adding standing  marching at countertop.  Continued with LE NMR, gait training, stair negotiation and safety when getting in/out of elevated bed.  Recommend he try getting into wife's bed at home as PT felt he would be able to do.      Rehab Potential  Good    Clinical Impairments Affecting Rehab Potential  Pt very motivated     PT Frequency  2x / week    PT Duration  8 weeks    PT Treatment/Interventions  ADLs/Self Care Home Management;Electrical Stimulation;DME Instruction;Gait training;Stair training;Functional mobility training;Therapeutic activities;Therapeutic exercise;Balance  training;Neuromuscular re-education;Patient/family education;Orthotic Fit/Training;Passive range of motion;Vestibular    PT Next Visit Plan  see if they were able to get into regular bed-if not keep practicing, gait with obstacles, balance, gait with LRAD (has used quad cane at home with HHPT but Merrilee Jansky 41/58 & recommend cont RW at home).  watch to see if we need to resume AFO    Consulted and Agree with Plan of Care  Patient;Family member/caregiver    Family Member Consulted  wife Baldwin [place] (goes by [employer])       Patient will benefit from skilled therapeutic intervention in order to improve the following deficits and impairments:  Abnormal gait, Decreased activity tolerance, Decreased balance, Decreased coordination, Decreased endurance, Decreased knowledge of use of DME, Decreased mobility, Decreased range of motion, Decreased strength, Impaired perceived functional ability, Impaired flexibility, Improper body mechanics, Postural dysfunction, Impaired UE functional use  Visit Diagnosis: Muscle weakness (generalized)  Unsteadiness on feet  Other abnormalities of gait and mobility  Hemiplegia and hemiparesis following cerebral infarction affecting right dominant side [facility])     Problem List Patient Active Problem List   Diagnosis Date Noted  . Diastolic dysfunction   . Dysphagia, post-stroke   . CKD (chronic kidney disease), stage  III ([place]) 06/03/2017  . 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 Stroke (cerebrum) ([place]) 06/03/2017  . CVA (cerebral vascular accident) ([place]) 06/01/2017  . TIA (transient ischemic attack) 05/31/2017  . Fatigue 05/08/2017  . Aortic atherosclerosis ([place]) 05/08/2017  . Glaucoma 05/06/2017  . Lipoma of forehead 11/02/2016  . Type II diabetes mellitus with renal manifestations ([place]) 11/02/2016  . Hyperlipidemia LDL goal <100 11/02/2016  . Constipation in male 11/02/2016  . Insomnia due to anxiety and fear 11/02/2016  . Colon cancer screening 05/04/2015  . Vitamin D deficiency 05/02/2015  . Medicare annual wellness visit, subsequent 06/23/2013  . Essential hypertension, benign 03/22/2013  . Osteoarthritis 03/22/2013  . Numbness and tingling in left hand 03/22/2013  . GERD (gastroesophageal reflux disease) 03/22/2013  . BPH (benign prostatic hyperplasia) 03/22/2013    Cameron Sprang, PT, MPT [facility] [address] [place], [place], [address] Phone: [phone]   Fax:  [phone] 10/31/17, 1:32 PM  Name: KENJI MAPEL MRN: [id number] Date of Birth: 09/10/1935

## 2017-10-31 NOTE — Patient Instructions (Signed)
Functional Quadriceps: Sit to Stand    Sit on edge of chair, feet flat on floor. Stand upright, extending knees fully. Look straight ahead.  Repeat __10__ times per set. Do __1__ sets per session. Do __2__ sessions per day.    Hip Side Kick    Holding a chair or the counter for balance, keep legs shoulder width apart and toes pointed forward. Lift leg out to side, keeping knee straight. Do not lean. Hold 3 seconds. Repeat using other leg. Repeat __20__ times. Do __1__ sessions per day.    Hip Backward Kick    Using a chair or the kitchen counter for balance, keep legs shoulder width apart and toes pointed forward. Slowly lift one leg back, keeping knee straight. Do not lean forward. Hold for 3 seconds. Repeat with other leg. Repeat __20_ times. Do __1__ sessions per day.  Marching In-Place    Do this standing beside counter top (You can put walker beside you if you need R hand to be supported).  Standing straight, alternate bringing knees toward trunk. Bent arms swing alternately. Do _10__ sets. Do _2__ times per day.  Copyright  VHI. All rights reserved.

## 2017-11-02 DIAGNOSIS — B3749 Other urogenital candidiasis: Secondary | ICD-10-CM | POA: Insufficient documentation

## 2017-11-02 DIAGNOSIS — R634 Abnormal weight loss: Secondary | ICD-10-CM | POA: Insufficient documentation

## 2017-11-02 NOTE — Assessment & Plan Note (Signed)
Starting remeron for appetite stimulation,  mamangement of insomnia and low energy .  75 mg starting dose,  Increase to 15 mg in 2 weeks.    rrtc one month

## 2017-11-02 NOTE — Assessment & Plan Note (Signed)
4 cm parotid mass,  Unchanged by repeat imaging.  ENT has evaluated patient;  No intervention planned

## 2017-11-02 NOTE — Assessment & Plan Note (Signed)
Well controlled on current regimen. Renal function stable, no changes today. Lab Results  Component Value Date   CREATININE 1.30 (H) 10/28/2017   Lab Results  Component Value Date   NA 136 09/01/2017   K 4.2 09/01/2017   CL 101 09/01/2017   CO2 28 09/01/2017

## 2017-11-02 NOTE — Assessment & Plan Note (Signed)
Continue nystatin bid and increase agitation

## 2017-11-02 NOTE — Assessment & Plan Note (Signed)
Starting remeron

## 2017-11-02 NOTE — Assessment & Plan Note (Signed)
Left pontine,  With rigth sided weakness responding  to PT/OT

## 2017-11-04 ENCOUNTER — Ambulatory Visit: Payer: Medicare Other | Admitting: Occupational Therapy

## 2017-11-04 ENCOUNTER — Ambulatory Visit: Payer: Medicare Other | Admitting: Adult Health

## 2017-11-04 ENCOUNTER — Encounter: Payer: Self-pay | Admitting: Occupational Therapy

## 2017-11-04 ENCOUNTER — Encounter: Payer: Self-pay | Admitting: Adult Health

## 2017-11-04 VITALS — BP 104/61 | HR 77 | Ht 69.0 in | Wt 137.0 lb

## 2017-11-04 DIAGNOSIS — E1129 Type 2 diabetes mellitus with other diabetic kidney complication: Secondary | ICD-10-CM | POA: Diagnosis not present

## 2017-11-04 DIAGNOSIS — R293 Abnormal posture: Secondary | ICD-10-CM

## 2017-11-04 DIAGNOSIS — M25611 Stiffness of right shoulder, not elsewhere classified: Secondary | ICD-10-CM

## 2017-11-04 DIAGNOSIS — R41842 Visuospatial deficit: Secondary | ICD-10-CM

## 2017-11-04 DIAGNOSIS — I6350 Cerebral infarction due to unspecified occlusion or stenosis of unspecified cerebral artery: Secondary | ICD-10-CM | POA: Diagnosis not present

## 2017-11-04 DIAGNOSIS — I69351 Hemiplegia and hemiparesis following cerebral infarction affecting right dominant side: Secondary | ICD-10-CM | POA: Diagnosis not present

## 2017-11-04 DIAGNOSIS — E785 Hyperlipidemia, unspecified: Secondary | ICD-10-CM

## 2017-11-04 DIAGNOSIS — I10 Essential (primary) hypertension: Secondary | ICD-10-CM | POA: Diagnosis not present

## 2017-11-04 DIAGNOSIS — R2681 Unsteadiness on feet: Secondary | ICD-10-CM

## 2017-11-04 DIAGNOSIS — M6281 Muscle weakness (generalized): Secondary | ICD-10-CM

## 2017-11-04 DIAGNOSIS — M25641 Stiffness of right hand, not elsewhere classified: Secondary | ICD-10-CM

## 2017-11-04 DIAGNOSIS — I69315 Cognitive social or emotional deficit following cerebral infarction: Secondary | ICD-10-CM

## 2017-11-04 DIAGNOSIS — M25631 Stiffness of right wrist, not elsewhere classified: Secondary | ICD-10-CM

## 2017-11-04 DIAGNOSIS — R600 Localized edema: Secondary | ICD-10-CM

## 2017-11-04 DIAGNOSIS — I639 Cerebral infarction, unspecified: Secondary | ICD-10-CM

## 2017-11-04 NOTE — Patient Instructions (Addendum)
Continue clopidogrel 75 mg daily  and crestor  for secondary stroke prevention   Continue to follow up with PCP regarding cholesterol, diabetes and blood pressure management   Continue physical and occupational therapy  Continue to monitor blood pressure at home   Speak to your primary doctor regarding Remeron and Neurontin side effect of fatigue  Maintain strict control of hypertension with blood pressure goal below 130/90, diabetes with hemoglobin A1c goal below 6.5% and cholesterol with LDL cholesterol (bad cholesterol) goal below 70 mg/dL. I also advised the patient to eat a healthy diet with plenty of whole grains, cereals, fruits and vegetables, exercise regularly and maintain ideal body weight.  Followup in the future with me in 6 months or call earlier if needed       Thank you for coming to see [place] at [facility]. I hope we have been able to provide you high quality care today.  You may receive a patient satisfaction survey over the next few weeks. We would appreciate your feedback and comments so that we may continue to improve ourselves and the health of our patients.

## 2017-11-04 NOTE — Therapy (Signed)
[facility] [address] [place], [place], [address] Phone: [phone]   Fax:  [phone]  Occupational Therapy Treatment  Patient Details  Name: Anthony Allen MRN: [id number] Date of Birth: 1936-05-29 Referring Provider: Venancio Poisson   Encounter Date: 11/04/2017  OT End of Session - 11/04/17 1803    Visit Number  3    Number of Visits  24    Date for OT Re-Evaluation  01/22/18    Authorization Type  [employer] Medicare.  $20 copay each OT/PT    Authorization - Visit Number  3    Authorization - Number of Visits  10    OT Start Time  1316    OT Stop Time  1400    OT Time Calculation (min)  44 min    Activity Tolerance  Patient tolerated treatment well    Behavior During Therapy  [facility] for tasks assessed/performed       Past Medical History:  Diagnosis Date  . Arthritis    "mild in my hands" (06/04/2017)  . Complication of anesthesia    "was told never to use Propofol because of parent's adverse reaction" (06/04/2017)  . CVA (cerebral vascular accident) ([place])    hospitalized from 12/22-12/24 due to left-sided weakness, slurred speech and difficulty swallowing. He was diagnosed as left pontine stroke./notes 06/03/2017  . Family history of adverse reaction to anesthesia    "mother and dad had allergic reaction Propofol" (06/04/2017)  . Glaucoma, both eyes   . Gout   . Hypertension   . Parotid mass   . Type 2 diabetes mellitus with hyperglycemia [facility])     Past Surgical History:  Procedure Laterality Date  . TONSILLECTOMY  1943    There were no vitals filed for this visit.  Subjective Assessment - 11/04/17 1759    Subjective   Wife indicates that neurontin and other new medicine is too sedating- working with MD to resolve    Patient is accompained by:  Family member    Currently in Pain?  No/denies    Multiple Pain Sites  No                   OT Treatments/Exercises (OP) - 11/04/17  0001      ADLs   ADL Comments  Patient issued flexion glove, and coban wrap to aid increased finger flexion in left hand.  Patient able to don glove, and adjust tension for digits.        Modalities   Modalities  Fluidotherapy      RUE Fluidotherapy   Number Minutes Fluidotherapy  15 Minutes    RUE Fluidotherapy Location  Hand;Wrist;Forearm    Comments  Hand wrapped into flexion      Manual Therapy   Manual therapy comments  Gentle mobilization and joint mob specifically to 4th digit PIP.  Improving passive and active digit flexion             OT Education - 11/04/17 1803    Education provided  Yes    Education Details  flexion glove    Person(s) Educated  Patient;Spouse    Methods  Explanation;Demonstration;Tactile cues;Verbal cues    Comprehension  Verbalized understanding;Need further instruction;Returned demonstration       OT Short Term Goals - 10/31/17 1338      OT SHORT TERM GOAL #1   Title  Patient will tshirt with set up assistance due 11/23/17    Status  On-going  OT SHORT TERM GOAL #2   Title  Patient will don socks with min assist    Status  On-going      OT SHORT TERM GOAL #3   Title  Patient will don pants with min assistance    Status  On-going      OT SHORT TERM GOAL #4   Title  Patient will complete HEP designed to improve shoulder range of motion with min assist and cueing    Status  On-going        OT Long Term Goals - 10/31/17 1339      OT LONG TERM GOAL #1   Title  Patient will dress himself with only occasional min assistance    Status  On-going      OT LONG TERM GOAL #2   Title  Patient will transfer into and out of shower with supervision      OT LONG TERM GOAL #3   Title  Patient will feed himself, and cut up his own food with set up assistance    Status  On-going      OT LONG TERM GOAL #4   Title  Patient will demonstrate at keast 5 degrees additional PIP flexion in digits 2-5 of right hand to aide in grasp and pinch  use.      Status  On-going      OT LONG TERM GOAL #5   Title  Patient will demonstrate active supination at least 10 degrees beyond neutral to aide in orienting hand to object needed for functional task    Status  On-going      OT LONG TERM GOAL #6   Title  Patient will demonstrate active low reach in right arm to obtain lightweight (1 lb or less)  item from countertop or tabletop if standing    Status  On-going      OT LONG TERM GOAL #7   Title  Patient will demonstrate at leaast 90 degrees of passive shoulder flexion or abduction with pain no greater than 2/10 to aide with hygiene and dressing tasks    Status  On-going            Plan - 11/04/17 1803    Clinical Impression Statement  Patient is showing improvement with left digit motion.      Occupational Profile and client history currently impacting functional performance  Husband, father - son with significant psychiatric dx- currently stable on medication, retired Quarry manager, Financial trader, Geophysicist/field seismologist, and Insurance claims handler performance deficits (Please refer to evaluation for details):  ADL's;IADL's;Rest and Sleep;Leisure;Social Participation    Rehab Potential  Fair    Current Impairments/barriers affecting progress:  concern for RSD right UE     OT Frequency  2x / week    OT Duration  12 weeks    OT Treatment/Interventions  Self-care/ADL training;Electrical Stimulation;Therapeutic exercise;Visual/perceptual remediation/compensation;Patient/family education;Splinting;Neuromuscular education;Paraffin;Moist Heat;Aquatic Therapy;Traction;Fluidtherapy;Functional Mobility Training;Scar mobilization;Therapeutic activities;Balance training;Energy conservation;Cognitive remediation/compensation;Passive range of motion;Manual Therapy;DME and/or AE instruction;Contrast Bath;Ultrasound;Cryotherapy    Plan  Need to establish HEP - Passive program for RUE.  check effectiveness of flexion glove / coban    Clinical  Decision Making  Multiple treatment options, significant modification of task necessary    OT Home Exercise Plan  Need to determine what has already been established    Consulted and Agree with Plan of Care  Patient;Family member/caregiver    Family Member Consulted  wife [name]       Patient will benefit  from skilled therapeutic intervention in order to improve the following deficits and impairments:  Decreased cognition, Decreased knowledge of use of DME, Decreased skin integrity, Increased edema, Impaired flexibility, Impaired vision/preception, Pain, Abnormal gait, Cardiopulmonary status limiting activity, Decreased coordination, Decreased mobility, Increased fascial restrictions, Impaired sensation, Improper body mechanics, Decreased activity tolerance, Decreased endurance, Decreased range of motion, Decreased strength, Impaired tone, Improper spinal/pelvic alignment, Decreased balance, Decreased knowledge of precautions, Decreased safety awareness, Difficulty walking, Impaired perceived functional ability, Impaired UE functional use  Visit Diagnosis: Stiffness of right shoulder, not elsewhere classified  Stiffness of right wrist, not elsewhere classified  Stiffness of right hand, not elsewhere classified  Visuospatial deficit  Localized edema  Cognitive social or emotional deficit following cerebral infarction  Hemiplegia and hemiparesis following cerebral infarction affecting right dominant side (HCC)  Unsteadiness on feet  Muscle weakness (generalized)  Abnormal posture    Problem List Patient Active Problem List   Diagnosis Date Noted  . Weight loss, unintentional 11/02/2017  . Candidiasis of scrotum 11/02/2017  . Diastolic dysfunction   . Dysphagia, post-stroke   . CKD (chronic kidney disease), stage III ([place]) 06/03/2017  . 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 Stroke (cerebrum) ([place]) 06/03/2017  . CVA (cerebral vascular accident) ([place]) 06/01/2017  . TIA  (transient ischemic attack) 05/31/2017  . Fatigue 05/08/2017  . Aortic atherosclerosis ([place]) 05/08/2017  . Glaucoma 05/06/2017  . Lipoma of forehead 11/02/2016  . Type II diabetes mellitus with renal manifestations ([place]) 11/02/2016  . Hyperlipidemia LDL goal <100 11/02/2016  . Constipation in male 11/02/2016  . Insomnia due to anxiety and fear 11/02/2016  . Colon cancer screening 05/04/2015  . Vitamin D deficiency 05/02/2015  . Medicare annual wellness visit, subsequent 06/23/2013  . Essential hypertension, benign 03/22/2013  . Osteoarthritis 03/22/2013  . Numbness and tingling in left hand 03/22/2013  . GERD (gastroesophageal reflux disease) 03/22/2013  . BPH (benign prostatic hyperplasia) 03/22/2013    Mariah Milling, OTR/L 11/04/2017, 6:05 PM  [facility] [address] [place], [place], [address] Phone: [phone]   Fax:  [phone]  Name: ROMIE TAY MRN: [id number] Date of Birth: 03/23/1936

## 2017-11-04 NOTE — Progress Notes (Signed)
[facility] [address] [place] [address] [phone]       OFFICE FOLLOW UP NOTE  [name] Date of Birth:  1935/12/30 Medical Record Number:  [id number]   Reason for Referral:  Hospital stroke follow up  CHIEF COMPLAINT:  Chief Complaint  Patient presents with  . Follow-up    Stroke follow up pt in room 9 with PTs PCP prescribed remeron and gabpentin.Pt had to stop gabpentin because it made him real sleepy pt had to discontinue, PCP stop the remeron it made him too groggy. Pt is in PT and OT outpatient therapy    HPI:  Anthony Allen is a 82 year old who was seen at [facility] for pontine stroke on 05/31/2017 with right sided weakness, slurred speech and difficulty swollowing.  He was doing well there and discharged in stable condition.  He was started on Plavix due to posterior circulation disease but had not filled the Plavix at discharge.  He comes into with waxing/waning right-sided weakness and significant worsening since [facility].  Prior to being discharged from [facility], he was able to ambulate with minimal assistance, but needed 3 people to get him in the car to his house after discharge.  The morning he came in to [facility], he was unable to lift his right arm at all.  TPA was not administered due to being outside of window.  CT scan reviewed and was negative for hemorrhage.  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 2D echo showed EF of 65-70% and negative for PFO.  Bilateral carotid ultrasound showed mild amount of calcified plaque at the right carotid bulb and proximal right ICA, minimal plaque at the level of the left carotid bulb and left ICA origin, and estimated bilateral ICA stenosis less than 50%.   It was recommended at discharge the patient continue aspirin 81 mg along with Plavix 75 mg daily along with Crestor 20 mg daily.  Patient was discharged to a skilled nursing facility at [facility].  08/05/17 visit: Since discharge, patient has been improving as far as speech and right-sided weakness and return home approximately 2 weeks ago.  Patient is accompanied today by wife and friend.  Patient states that he was not given aspirin while he was in the nursing home,  He only received Plavix in which he denied increased bleeding or bruising. Patient is continuing to utilize home PT, OT, and ST 2-3 times per week.  Patient's blood pressure has been controlled and is satisfactory today at 112/63.  Patient continues to take Crestor and denies side effects of myalgias.  Wife continues to stay home with patient but does higher CNA to assist with some ADLs such as bathing and dressing.  Patient does say he continues to have some right-sided weakness but this has improved and he is getting great benefit out of therapies.  Patient also states that he is able to ambulate short distance with his rolling walker.  Patient denies new or worsening TIA/stroke symptoms.  11/04/17 UPDATE: Patient returns today for follow-up visit and is accompanied by his wife.  He continues PT/OT at our neuro rehab center 2 times per week where he continues to make progress.  His PCP recently started him on a trial of Neurontin and Remeron  were patient was able to take 1 dose apiece Friday evening but this made him too lethargic.  Wife planning on calling PCP today to discuss this.  Patient continues to take Plavix without side effects of bleeding or bruising.  Continues to take Crestor without side effects myalgias.  PCP recently decreased Crestor dose from 20 mg to 10 mg.  Blood pressure today satisfactory 104/61.  He is currently using wheelchair for long distance but is able to use rolling walker for short distance ambulation.  Denies  new or worsening stroke/TIA symptoms.  ROS:   14 system review of systems performed and negative with exception of unexpected weight change, runny nose, drooling, restless leg, insomnia, frequency of urination, joint pain, walking difficulty, and speech difficulty  PMH:  Past Medical History:  Diagnosis Date  . Arthritis    "mild in my hands" (06/04/2017)  . Complication of anesthesia    "was told never to use Propofol because of parent's adverse reaction" (06/04/2017)  . CVA (cerebral vascular accident) ([place])    hospitalized from 12/22-12/24 due to left-sided weakness, slurred speech and difficulty swallowing. He was diagnosed as left pontine stroke./notes 06/03/2017  . Family history of adverse reaction to anesthesia    "mother and dad had allergic reaction Propofol" (06/04/2017)  . Glaucoma, both eyes   . Gout   . Hypertension   . Parotid mass   . Type 2 diabetes mellitus with hyperglycemia (HCC)     PSH:  Past Surgical History:  Procedure Laterality Date  . TONSILLECTOMY  1943    Social History:  Social History   Socioeconomic History  . Marital status: Married    Spouse name: Not on file  . Number of children: Not on file  . Years of education: Not on file  . Highest education level: Not on file  Occupational History  . Not on file  Social Needs  . Financial resource strain: Not on file  . Food insecurity:    Worry: Not on file    Inability: Not on file  . Transportation needs:    Medical: Not on file    Non-medical: Not on file  Tobacco Use  . Smoking status: Never Smoker  . Smokeless tobacco: Never Used  Substance and Sexual Activity  . Alcohol use: Yes    Comment: 06/04/2017 "might have 1 drink q 2 wk; if that"  . Drug use: No  . Sexual activity: Not Currently  Lifestyle  . Physical activity:    Days per week: Not on file    Minutes per session: Not on file  . Stress: Not on file  Relationships  . Social connections:    Talks on phone: Not on  file    Gets together: Not on file    Attends religious service: Not on file    Active member of club or organization: Not on file    Attends meetings of clubs or organizations: Not on file    Relationship status: Not on file  . Intimate partner violence:    Fear of current or ex partner: Not on file    Emotionally abused: Not on file    Physically abused: Not on file    Forced sexual activity: Not on file  Other Topics Concern  . Not on file  Social History Narrative  . Not on file    Family History:  Family History  Problem Relation Age of Onset  . Arthritis Mother   . Stroke Father   .  Hypertension Father   . Heart disease Father 61       AMI  . Kidney disease Father        bladder ca congenital loss of kidney  . Arthritis Maternal Grandmother   . Early death Daughter 16       aspiration  . Cancer Brother        Scalp    Medications:   Current Outpatient Medications on File Prior to Visit  Medication Sig Dispense Refill  . ALPRAZolam (XANAX) 0.5 MG tablet 30TK 1 T PO Q NIGHT SHIFT FOR ANXIETY / INSOMNIA 30 tablet 5  . Cholecalciferol (VITAMIN D-3) 1000 units CAPS Take 1,000 Units by mouth daily.    . clopidogrel (PLAVIX) 75 MG tablet Take 1 tablet (75 mg total) by mouth daily. 30 tablet 2  . empagliflozin (JARDIANCE) 10 MG TABS tablet Take 10 mg by mouth daily. 30 tablet 3  . fluticasone (FLONASE) 50 MCG/ACT nasal spray Place 2 sprays into both nostrils daily as needed for allergies or rhinitis.     Marland Kitchen HYDROCORTISONE, TOPICAL, 2 % LOTN Apply 1 application topically as needed (itching).    Marland Kitchen latanoprost (XALATAN) 0.005 % ophthalmic solution Instill 1 drop into both eyes in the evening  0  . losartan (COZAAR) 50 MG tablet Take 1 tablet (50 mg total) by mouth daily. 90 tablet 3  . nystatin (NYSTATIN) powder Apply topically 2 (two) times daily. As needed for rash 60 g 2  . rosuvastatin (CRESTOR) 10 MG tablet Take 1 tablet (10 mg total) by mouth daily. 90 tablet 0  .  timolol (TIMOPTIC) 0.5 % ophthalmic solution Instill 1 drop into the right eye in the morning  0   No current facility-administered medications on file prior to visit.     Allergies:  No Known Allergies  Physical Exam  Vitals:   11/04/17 1116  BP: 104/61  Pulse: 77  Weight: 137 lb (62.1 kg)  Height: 5\' 9"  (1.753 m)   Body mass index is 20.23 kg/m. No exam data present  General: well developed, pleasant elderly Caucasian male, well nourished, seated, in no evident distress Head: Frontal nodule present along with right mandible nodule Neck: supple with no carotid or supraclavicular bruits Cardiovascular: regular rate and rhythm, no murmurs Musculoskeletal: no deformity Skin:  no rash/petichiae Vascular:  Normal pulses all extremities  Neurologic Exam Mental Status: Awake and fully alert. Oriented to place and time. Recent and remote memory intact. Attention span, concentration and fund of knowledge appropriate. Mood and affect appropriate.  Cranial Nerves: Fundoscopic exam reveals sharp disc margins. Pupils equal, briskly reactive to light. Extraocular movements full with dysmetria present. Visual fields full to confrontation. Hearing intact. Facial sensation intact. Face, tongue, palate moves normally and symmetrically.  Motor: Tone normal in all extremities.  Right hand grasp weak but able to bend fingers.  55 biceps strength in right arm.  3/5 triceps strength in right arm.  5/5 right shoulder abductor.  Right leg 4/5.  No weakness observed in right ankle dorsiflexion Sensory.: intact to touch , pinprick , position and vibratory sensation.  Coordination: Decreased rapid alternating movements in right upper extremity.  Finger-to-nose and heel-to-shin performed accurately in left upper and lower extremity.  Unable to test right side due to weakness. Gait and Station: Patient currently sitting in wheelchair and as he did not bring rolling walker to appointment, unable to test gait.     Reflexes: 2+ and symmetric. Toes downgoing.    Diagnostic Data (  Labs, Imaging, Testing)  Ct Head Wo Contrast Result Date: 06/03/2017 IMPRESSION: Mild chronic ischemic white matter disease. Possible low density seen in left pons which may correspond to infarction seen on prior MRI. No other abnormality is seen.  Ct Angio Head and Neck W Or Wo Contrast Result Date: 06/03/2017 IMPRESSION: 1. No acute intracranial finding when compared to brain MRI and MRA 2 days prior. 2. A known acute left pontine infarct is subtle by CT. 3. 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 Notable irregular plaque at the proximal left subclavian. 4. 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 5. Known left parapharyngeal and deep parotid mass/neoplasm. ENT referral has been recommended previously. 6. Alveolar ridge and body erosion of the left mandible, recommend mucosal exam to exclude underlying destructive lesion.  Echocardiogram: 06/01/2017 Study Conclusions  Left ventricle: The cavity size was normal. Wall thickness was normal. Systolic function was vigorous. The estimated ejection fraction was in the range of 65% to 70%. Wall motion was normal; there were no regional wall motion abnormalities. Doppler parameters are consistent with abnormal left ventricular relaxation (grade 1 diastolic dysfunction). - Mitral valve: Valve area by pressure half-time: 2.27 cm^2. Impressions: - Normal Overall LVF Normal Wall Motion EF=65% Normal Right side. Normal study.  B/L Carotid U/S:   IMPRESSION: Mild amount of calcified plaque at the level of the right carotid bulb and proximal right ICA. Minimal plaque at the level of the left carotid bulb and left ICA origin. Estimated bilateral ICA stenoses are less than 50%.     ASSESSMENT: 82 y.o. year old male here with left pontine stroke at outside hospital on 05/31/2017 and presented to [facility] with increased right-sided  weakness but negative for new stroke on 06/01/2017 likely recurrence from recent left pontine lacunar stroke due to small vessel disease but he also has concurrent intracranial atherosclerosis.  Vascular risk factors include diabetes mellitus, hyperlipidemia , intracranial atherosclerosis and hypertension.  Patient returns today for follow-up appointment and overall is doing well and continues to improve with right-sided hemiparesis.   PLAN: -Continue clopidogrel 75 mg daily  and Crestor for secondary stroke prevention -F/u with PCP regarding your HLD and HTN management -continue to monitor BP at home -Continue PT/OT -Maintain strict control of hypertension with blood pressure goal below 130/90, diabetes with hemoglobin A1c goal below 6.5% and cholesterol with LDL cholesterol (bad cholesterol) goal below 70 mg/dL. I also advised the patient to eat a healthy diet with plenty of whole grains, cereals, fruits and vegetables, exercise regularly and maintain ideal body weight.  Follow up in 6 months or call earlier if needed  Greater than 50% of time during this 25 minute visit was spent on counseling,explanation of diagnosis of left pontine stroke, reviewing risk factor management of DM, HLD, and HTN, planning of further management, discussion with patient and family and coordination of care  Venancio Poisson, [facility]  [facility] [address] [place], [place] [address]  Phone [phone] Fax [phone]

## 2017-11-06 ENCOUNTER — Telehealth: Payer: Self-pay

## 2017-11-06 NOTE — Telephone Encounter (Signed)
Copied from [place] [phone]. Topic: Appointment Scheduling - Scheduling Inquiry for Clinic >> Nov 06, 2017 12:53 PM Margot Ables wrote: Reason for CRM: pt wife called to cancel 6/4 appt bc pt was seen 10/31/17. He is supposed to f/u in 1 mo. No OV available as they are all same day. Please f/u with wife.

## 2017-11-06 NOTE — Telephone Encounter (Signed)
Spoke with pt's wife and rescheduled pt's appt.

## 2017-11-07 ENCOUNTER — Ambulatory Visit: Payer: Medicare Other | Admitting: Rehabilitation

## 2017-11-07 ENCOUNTER — Ambulatory Visit: Payer: Medicare Other | Admitting: Occupational Therapy

## 2017-11-07 ENCOUNTER — Encounter: Payer: Self-pay | Admitting: Rehabilitation

## 2017-11-07 ENCOUNTER — Encounter

## 2017-11-07 DIAGNOSIS — R293 Abnormal posture: Secondary | ICD-10-CM

## 2017-11-07 DIAGNOSIS — R41842 Visuospatial deficit: Secondary | ICD-10-CM

## 2017-11-07 DIAGNOSIS — M25631 Stiffness of right wrist, not elsewhere classified: Secondary | ICD-10-CM

## 2017-11-07 DIAGNOSIS — I69351 Hemiplegia and hemiparesis following cerebral infarction affecting right dominant side: Secondary | ICD-10-CM | POA: Diagnosis not present

## 2017-11-07 DIAGNOSIS — M25611 Stiffness of right shoulder, not elsewhere classified: Secondary | ICD-10-CM

## 2017-11-07 DIAGNOSIS — I69315 Cognitive social or emotional deficit following cerebral infarction: Secondary | ICD-10-CM

## 2017-11-07 DIAGNOSIS — R2689 Other abnormalities of gait and mobility: Secondary | ICD-10-CM

## 2017-11-07 DIAGNOSIS — R2681 Unsteadiness on feet: Secondary | ICD-10-CM

## 2017-11-07 DIAGNOSIS — M6281 Muscle weakness (generalized): Secondary | ICD-10-CM

## 2017-11-07 DIAGNOSIS — R600 Localized edema: Secondary | ICD-10-CM

## 2017-11-07 DIAGNOSIS — M25641 Stiffness of right hand, not elsewhere classified: Secondary | ICD-10-CM

## 2017-11-07 NOTE — Patient Instructions (Signed)
Shoulder stretch  While seated, clasp fingers together. Place one hand on either side of right knee and reach downward toward the floor.  Allow the weight of your arm to stretch your right shoulder.  Discontinue if this causes pain.

## 2017-11-07 NOTE — Therapy (Signed)
[facility] [address] [place], [place], [address] Phone: [phone]   Fax:  [phone]  Physical Therapy Treatment  Patient Details  Name: Anthony Allen MRN: [id number] Date of Birth: Mar 31, 1936 Referring Provider: Venancio Poisson   Encounter Date: 11/07/2017  PT End of Session - 11/07/17 2052    Visit Number  4    Number of Visits  17    Date for PT Re-Evaluation  12/23/17    Authorization Type  UHC Medicare    PT Start Time  1150 started late due to being in fluido machine    PT Stop Time  1230    PT Time Calculation (min)  40 min    Equipment Utilized During Treatment  Gait belt    Activity Tolerance  Patient tolerated treatment well    Behavior During Therapy  [facility] for tasks assessed/performed       Past Medical History:  Diagnosis Date  . Arthritis    "mild in my hands" (06/04/2017)  . Complication of anesthesia    "was told never to use Propofol because of parent's adverse reaction" (06/04/2017)  . CVA (cerebral vascular accident) ([place])    hospitalized from 12/22-12/24 due to left-sided weakness, slurred speech and difficulty swallowing. He was diagnosed as left pontine stroke./notes 06/03/2017  . Family history of adverse reaction to anesthesia    "mother and dad had allergic reaction Propofol" (06/04/2017)  . Glaucoma, both eyes   . Gout   . Hypertension   . Parotid mass   . Type 2 diabetes mellitus with hyperglycemia [facility]     Past Surgical History:  Procedure Laterality Date  . TONSILLECTOMY  1943    There were no vitals filed for this visit.  Subjective Assessment - 11/07/17 1157    Subjective  Pt reports getting in and out of bed by himself, going to bathroom by himself with RW.  Still in hospital bed.      Patient is accompained by:  Family member    Pertinent History  glaucoma    Limitations  House hold activities;Walking;Standing    Patient Stated Goals  "walk better."     Currently in Pain?  No/denies                       [facility] Adult PT Treatment/Exercise - 11/07/17 1225      Ambulation/Gait   Ambulation/Gait  Yes    Ambulation/Gait Assistance  5: Supervision;4: Min assist    Ambulation/Gait Assistance Details  Pt able to ambulate with RW at distant S level with cues for posture and improved R step length.  Also initiated gait training today with use of SPC with quad tip attachment for improved quality and also for improved RLE activation during all phases of gait.  Cues for sequencing and technique with cane and cues for increased attention to R foot, landing with improved heel strike and controlled movement of RLE.  Pt with only single mild LOB needing min A to correct, otherwise was min/guard.  Note difficulty with turns, therefore had pt work on figure 8 walking pattern around [employer] x 6 reps during session with cues for stepping sequence and allowing increased space for cane when negotiating around obstacle.      Ambulation Distance (Feet)  200 Feet then another 20 with figure 8 task    Assistive device  Rolling walker;Straight cane w/ quad tip attachment.     Gait Pattern  Step-through  pattern;Decreased arm swing - right;Decreased arm swing - left;Decreased stride length;Decreased dorsiflexion - right;Decreased weight shift to right;Right foot flat;Trunk flexed;Narrow base of support    Ambulation Surface  Level;Indoor      Self-Care   Self-Care  Other Self-Care Comments    Other Self-Care Comments   Pt given homework to try to use wife's bed over weekend.  Also encouraged him to ambulate into clinic with RW and S from wife at next session even if she needs to pull under awning walk with him to lobby and then go park car to increase independence.  Also educated that PT would like for pt to wear lace up shoes to next session in order to better assess for need of foot up brace/AFO.        Therapeutic Activites    Therapeutic Activities   Other Therapeutic Activities    Other Therapeutic Activities  Pt still reports he has not tried to get in wife's bed yet, but is getting in/out of hospital bed on his own.  Practiced during session to increase confidence with task and answer any questions.  Pt reports that they do have single step that he can use if bed height is too elevated to sit and scoot on.  Pt able to step up backwards to 6" step, back up to bed and sit safely.  Pt also able to demonstrate lying down and sitting up.  Note that pt normally uses bed rail to sit up, however was able to do during session without use of rails with min cues for improved technique.  Pt and spouse very pleased that he is able to do this.  Pt to try this over the weekend.          Therex:  Briefly went over seated chin tuck for improved posture.  Pt able to return demo very well and therefore provided for HEP at home when seated in recliner with cues to push against head rest.      PT Education - 11/07/17 2052    Education provided  Yes    Education Details  See self care    Person(s) Educated  Patient    Methods  Explanation    Comprehension  Verbalized understanding       PT Short Term Goals - 10/24/17 1250      PT SHORT TERM GOAL #1   Title  Pt/wife will initiate HEP in order to indicate improved functional mobility (Target date: 11/23/17)    Time  4    Period  Weeks    Status  New    Target Date  11/23/17      PT SHORT TERM GOAL #2   Title  Pt will improve TUG to </=33 secs w/ LRAD at S level in order to indicate decreased fall risk.      Time  4    Period  Weeks    Status  New      PT SHORT TERM GOAL #3   Title  Will assess BERG and improve score by 3 points from baseline in order to indicate decreased fall risk.     Time  4    Period  Weeks    Status  New      PT SHORT TERM GOAL #4   Title  Pt will improve 5TSS to </= 21 secs with single UE support only in order to indicate decreased fall risk and improved functional  strength.      Time  4    Period  Weeks    Status  New      PT SHORT TERM GOAL #5   Title  Pt will improve gait speed to 1.94 ft/sec w/ LRAD at S level in order to indicate decreased fall risk.      Time  4    Period  Weeks    Status  New      Additional Short Term Goals   Additional Short Term Goals  Yes      PT SHORT TERM GOAL #6   Title  Pt will ambulate short household distances (up to 50') w/ quad cane at S level in order to indicate improved independence at home.     Time  4    Period  Weeks    Status  New        PT Long Term Goals - 10/24/17 1256      PT LONG TERM GOAL #1   Title  Pt/wife will be independent with final HEP in order to indicate improved functional mobility and decreased fall risk. (Target Date: 12/23/17)    Time  8    Period  Weeks    Status  New    Target Date  12/23/17      PT LONG TERM GOAL #2   Title  Pt will improve BERG balance score by 6 points from baseline in order to indicate decreased fall risk.     Time  8    Period  Weeks    Status  New      PT LONG TERM GOAL #3   Title  Pt will improve TUG time to </=30 secs w/ LRAD at mod I level in order to indicate decreased fall risk.      Time  8    Period  Weeks    Status  New      PT LONG TERM GOAL #4   Title  Pt will improve 5TSS to </=19 secs without UE support in order to indicate improved functional strength and decreased fall risk.      Time  8    Period  Weeks    Status  New      PT LONG TERM GOAL #5   Title  Pt will ambulate x 500' w/ LRAD over indoor surfaces at mod I level in order to indicate improved functional mobility.      Time  8    Period  Weeks    Status  New      Additional Long Term Goals   Additional Long Term Goals  Yes      PT LONG TERM GOAL #6   Title  Pt will ambulate x 300' over unlevel paved outdoor surfaces w/ LRAD at S level in order to indicate improved community negotiation.      Time  8    Period  Weeks    Status  New            Plan -  11/07/17 2052    Clinical Impression Statement  Skilled session focused on safety with getting in/out of bed at home (wife's bed not hospital bed) and they are to try and home.  Also initiated gait training with use of SPC with quad tip attachment.  Pt did very well and is very pleased with his progress.      Rehab Potential  Good    Clinical Impairments Affecting Rehab Potential  Pt very motivated  PT Frequency  2x / week    PT Duration  8 weeks    PT Treatment/Interventions  ADLs/Self Care Home Management;Electrical Stimulation;DME Instruction;Gait training;Stair training;Functional mobility training;Therapeutic activities;Therapeutic exercise;Balance training;Neuromuscular re-education;Patient/family education;Orthotic Fit/Training;Passive range of motion;Vestibular    PT Next Visit Plan  see if they were able to get into regular bed-if not keep practicing, gait with obstacles, balance, gait with LRAD (has used quad cane at home with HHPT but Merrilee Jansky 41/58 & recommend cont RW at home).  watch to see if we need to resume AFO    Consulted and Agree with Plan of Care  Patient;Family member/caregiver    Family Member Consulted  wife Baldwin [place] (goes by [employer])       Patient will benefit from skilled therapeutic intervention in order to improve the following deficits and impairments:  Abnormal gait, Decreased activity tolerance, Decreased balance, Decreased coordination, Decreased endurance, Decreased knowledge of use of DME, Decreased mobility, Decreased range of motion, Decreased strength, Impaired perceived functional ability, Impaired flexibility, Improper body mechanics, Postural dysfunction, Impaired UE functional use  Visit Diagnosis: Hemiplegia and hemiparesis following cerebral infarction affecting right dominant side (HCC)  Unsteadiness on feet  Muscle weakness (generalized)  Abnormal posture  Other abnormalities of gait and mobility     Problem List Patient Active Problem List    Diagnosis Date Noted  . Weight loss, unintentional 11/02/2017  . Candidiasis of scrotum 11/02/2017  . Diastolic dysfunction   . Dysphagia, post-stroke   . CKD (chronic kidney disease), stage III ([place]) 06/03/2017  . 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 Stroke (cerebrum) ([place]) 06/03/2017  . CVA (cerebral vascular accident) ([place]) 06/01/2017  . TIA (transient ischemic attack) 05/31/2017  . Fatigue 05/08/2017  . Aortic atherosclerosis ([place]) 05/08/2017  . Glaucoma 05/06/2017  . Lipoma of forehead 11/02/2016  . Type II diabetes mellitus with renal manifestations ([place]) 11/02/2016  . Hyperlipidemia LDL goal <100 11/02/2016  . Constipation in male 11/02/2016  . Insomnia due to anxiety and fear 11/02/2016  . Colon cancer screening 05/04/2015  . Vitamin D deficiency 05/02/2015  . Medicare annual wellness visit, subsequent 06/23/2013  . Essential hypertension, benign 03/22/2013  . Osteoarthritis 03/22/2013  . Numbness and tingling in left hand 03/22/2013  . GERD (gastroesophageal reflux disease) 03/22/2013  . BPH (benign prostatic hyperplasia) 03/22/2013    Cameron Sprang, PT, MPT [facility] [address] [place], [place], [address] Phone: [phone]   Fax:  [phone] 11/07/17, 8:57 PM  Name: Anthony Allen MRN: [id number] Date of Birth: 03/14/1936

## 2017-11-07 NOTE — Patient Instructions (Signed)
Axial Extension (Chin Tuck)    Pull chin in and lengthen back of neck. Hold __5__ seconds while counting out loud. Repeat __10__ times. Do __several__ sessions per day.  While seated in recliner, with chair upright, push head back into head rest.   http://gt2.exer.us/449   Copyright  VHI. All rights reserved.

## 2017-11-07 NOTE — Therapy (Signed)
[facility] [address] [place], [place], [address] Phone: [phone]   Fax:  [phone]  Occupational Therapy Treatment  Patient Details  Name: Anthony Allen MRN: [id number] Date of Birth: 01-05-1936 Referring Provider: Venancio Poisson   Encounter Date: 11/07/2017  OT End of Session - 11/07/17 1203    Visit Number  4    Number of Visits  24    Date for OT Re-Evaluation  01/22/18    Authorization Type  UHC Medicare.  $20 copay each OT/PT    Authorization - Visit Number  4    Authorization - Number of Visits  10    OT Start Time  1103    OT Stop Time  1145    OT Time Calculation (min)  42 min    Activity Tolerance  Patient tolerated treatment well    Behavior During Therapy  [facility] for tasks assessed/performed       Past Medical History:  Diagnosis Date  . Arthritis    "mild in my hands" (06/04/2017)  . Complication of anesthesia    "was told never to use Propofol because of parent's adverse reaction" (06/04/2017)  . CVA (cerebral vascular accident) ([place])    hospitalized from 12/22-12/24 due to left-sided weakness, slurred speech and difficulty swallowing. He was diagnosed as left pontine stroke./notes 06/03/2017  . Family history of adverse reaction to anesthesia    "mother and dad had allergic reaction Propofol" (06/04/2017)  . Glaucoma, both eyes   . Gout   . Hypertension   . Parotid mass   . Type 2 diabetes mellitus with hyperglycemia [facility])     Past Surgical History:  Procedure Laterality Date  . TONSILLECTOMY  1943    There were no vitals filed for this visit.                OT Treatments/Exercises (OP) - 11/07/17 0001      ADLs   UB Dressing  Patient needed initial cue to recognize an ineffective technique, but then he was able to come up with an alternative solution that worked.  Patient able to don tshirt without assistance.      LB Dressing  Patient indicates he has  never put his pants on since his stroke.  Wife agreed, stating there was no way he could have done that before- he really turned a corner on Mother's Day"  Patient able to remove shoes, socks, and pants and don with only intermittent min assist and cueing for error recogntion and increased time.  Wife wa snot present for this aspect of the session, but at the end reviewed that it was important for patient to complete dressing tasks both to improve his independence and to free her time, but also to help keep and improve his overall mobility and right arm function.      Toileting  Patient reports that he can now walk to bathroom at night without assistance.        Neurological Re-education Exercises   Other Exercises 1  Initiated a home activity program:  created a list of tasks or aspects of tasks that patient can complete with his right hand.  Patient has learned non use of right hand - and need to change behavior to incorporate this extremity back in to daily life skills.        RUE Fluidotherapy   Number Minutes Fluidotherapy  5 Minutes    RUE Fluidotherapy Location  Hand;Wrist    Comments  Hand wrapped to increase flexion             OT Education - 11/07/17 1202    Education provided  Yes    Education Details  stretch for right shoulder, and home activities program to define tasks that patient can do with his right arm    Person(s) Educated  Patient;Spouse    Methods  Explanation;Demonstration;Tactile cues;Verbal cues;Handout    Comprehension  Returned demonstration;Verbalized understanding       OT Short Term Goals - 11/07/17 1205      OT SHORT TERM GOAL #1   Title  Patient will tshirt with set up assistance due 11/23/17    Status  Achieved      OT SHORT TERM GOAL #2   Title  Patient will don socks with min assist    Status  Achieved      OT SHORT TERM GOAL #3   Title  Patient will don pants with min assistance    Status  Achieved      OT SHORT TERM GOAL #4   Title   Patient will complete HEP designed to improve shoulder range of motion with min assist and cueing    Status  On-going        OT Long Term Goals - 11/07/17 1205      OT LONG TERM GOAL #1   Title  Patient will dress himself with only occasional min assistance    Status  On-going      OT LONG TERM GOAL #2   Title  Patient will transfer into and out of shower with supervision    Status  On-going      OT LONG TERM GOAL #3   Title  Patient will feed himself, and cut up his own food with set up assistance    Status  On-going      OT LONG TERM GOAL #4   Title  Patient will demonstrate at keast 5 degrees additional PIP flexion in digits 2-5 of right hand to aide in grasp and pinch use.      Status  On-going      OT LONG TERM GOAL #5   Title  Patient will demonstrate active supination at least 10 degrees beyond neutral to aide in orienting hand to object needed for functional task    Status  On-going      OT LONG TERM GOAL #6   Title  Patient will demonstrate active low reach in right arm to obtain lightweight (1 lb or less)  item from countertop or tabletop if standing    Status  On-going      OT LONG TERM GOAL #7   Title  Patient will demonstrate at leaast 90 degrees of passive shoulder flexion or abduction with pain no greater than 2/10 to aide with hygiene and dressing tasks    Status  On-going            Plan - 11/07/17 1204    Clinical Impression Statement  Patient is showing improvement with right hand functional use, and improved particiaption in ADL    Occupational Profile and client history currently impacting functional performance  Husband, father - son with significant psychiatric dx- currently stable on medication, retired Quarry manager, Financial trader, Geophysicist/field seismologist, and Insurance claims handler performance deficits (Please refer to evaluation for details):  ADL's;IADL's;Rest and Sleep;Leisure;Social Participation    Rehab Potential  Fair    Current  Impairments/barriers affecting progress:  concern for RSD right  UE     OT Frequency  2x / week    OT Duration  12 weeks    OT Treatment/Interventions  Self-care/ADL training;Electrical Stimulation;Therapeutic exercise;Visual/perceptual remediation/compensation;Patient/family education;Splinting;Neuromuscular education;Paraffin;Moist Heat;Aquatic Therapy;Traction;Fluidtherapy;Functional Mobility Training;Scar mobilization;Therapeutic activities;Balance training;Energy conservation;Cognitive remediation/compensation;Passive range of motion;Manual Therapy;DME and/or AE instruction;Contrast Bath;Ultrasound;Cryotherapy    Plan  Check list for right hand function, NMR R shoulder    Clinical Decision Making  Multiple treatment options, significant modification of task necessary    OT Home Exercise Plan  Need to determine what has already been established    Consulted and Agree with Plan of Care  Patient;Family member/caregiver    Family Member Consulted  wife - [name]       Patient will benefit from skilled therapeutic intervention in order to improve the following deficits and impairments:  Decreased cognition, Decreased knowledge of use of DME, Decreased skin integrity, Increased edema, Impaired flexibility, Impaired vision/preception, Pain, Abnormal gait, Cardiopulmonary status limiting activity, Decreased coordination, Decreased mobility, Increased fascial restrictions, Impaired sensation, Improper body mechanics, Decreased activity tolerance, Decreased endurance, Decreased range of motion, Decreased strength, Impaired tone, Improper spinal/pelvic alignment, Decreased balance, Decreased knowledge of precautions, Decreased safety awareness, Difficulty walking, Impaired perceived functional ability, Impaired UE functional use  Visit Diagnosis: Stiffness of right shoulder, not elsewhere classified  Stiffness of right wrist, not elsewhere classified  Stiffness of right hand, not elsewhere  classified  Visuospatial deficit  Localized edema  Cognitive social or emotional deficit following cerebral infarction  Hemiplegia and hemiparesis following cerebral infarction affecting right dominant side (HCC)  Unsteadiness on feet  Muscle weakness (generalized)  Abnormal posture    Problem List Patient Active Problem List   Diagnosis Date Noted  . Weight loss, unintentional 11/02/2017  . Candidiasis of scrotum 11/02/2017  . Diastolic dysfunction   . Dysphagia, post-stroke   . CKD (chronic kidney disease), stage III ([place]) 06/03/2017  . 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 Stroke (cerebrum) ([place]) 06/03/2017  . CVA (cerebral vascular accident) ([place]) 06/01/2017  . TIA (transient ischemic attack) 05/31/2017  . Fatigue 05/08/2017  . Aortic atherosclerosis ([place]) 05/08/2017  . Glaucoma 05/06/2017  . Lipoma of forehead 11/02/2016  . Type II diabetes mellitus with renal manifestations ([place]) 11/02/2016  . Hyperlipidemia LDL goal <100 11/02/2016  . Constipation in male 11/02/2016  . Insomnia due to anxiety and fear 11/02/2016  . Colon cancer screening 05/04/2015  . Vitamin D deficiency 05/02/2015  . Medicare annual wellness visit, subsequent 06/23/2013  . Essential hypertension, benign 03/22/2013  . Osteoarthritis 03/22/2013  . Numbness and tingling in left hand 03/22/2013  . GERD (gastroesophageal reflux disease) 03/22/2013  . BPH (benign prostatic hyperplasia) 03/22/2013    Mariah Milling, OTR/L 11/07/2017, 12:07 PM  [facility] [address] [place], [place], [address] Phone: [phone]   Fax:  [phone]  Name: LOUISE VICTORY MRN: [id number] Date of Birth: [birth year]

## 2017-11-10 ENCOUNTER — Ambulatory Visit: Payer: Medicare Other | Admitting: Occupational Therapy

## 2017-11-10 ENCOUNTER — Encounter: Payer: Self-pay | Admitting: Rehabilitation

## 2017-11-10 ENCOUNTER — Encounter: Payer: Self-pay | Admitting: Occupational Therapy

## 2017-11-10 ENCOUNTER — Ambulatory Visit: Payer: Medicare Other | Attending: Adult Health | Admitting: Rehabilitation

## 2017-11-10 DIAGNOSIS — M6281 Muscle weakness (generalized): Secondary | ICD-10-CM | POA: Diagnosis present

## 2017-11-10 DIAGNOSIS — I69351 Hemiplegia and hemiparesis following cerebral infarction affecting right dominant side: Secondary | ICD-10-CM | POA: Insufficient documentation

## 2017-11-10 DIAGNOSIS — R2681 Unsteadiness on feet: Secondary | ICD-10-CM | POA: Diagnosis present

## 2017-11-10 DIAGNOSIS — R600 Localized edema: Secondary | ICD-10-CM | POA: Diagnosis present

## 2017-11-10 DIAGNOSIS — M25631 Stiffness of right wrist, not elsewhere classified: Secondary | ICD-10-CM

## 2017-11-10 DIAGNOSIS — I69315 Cognitive social or emotional deficit following cerebral infarction: Secondary | ICD-10-CM

## 2017-11-10 DIAGNOSIS — R293 Abnormal posture: Secondary | ICD-10-CM | POA: Insufficient documentation

## 2017-11-10 DIAGNOSIS — M25611 Stiffness of right shoulder, not elsewhere classified: Secondary | ICD-10-CM | POA: Insufficient documentation

## 2017-11-10 DIAGNOSIS — R2689 Other abnormalities of gait and mobility: Secondary | ICD-10-CM | POA: Insufficient documentation

## 2017-11-10 DIAGNOSIS — M25641 Stiffness of right hand, not elsewhere classified: Secondary | ICD-10-CM | POA: Diagnosis present

## 2017-11-10 DIAGNOSIS — R41842 Visuospatial deficit: Secondary | ICD-10-CM | POA: Diagnosis present

## 2017-11-10 NOTE — Therapy (Signed)
[facility] [address] [place], [place], [address] Phone: [phone]   Fax:  [phone]  Occupational Therapy Treatment  Patient Details  Name: Anthony Allen MRN: [id number] Date of Birth: May 11, 1936 Referring Provider: Venancio Poisson   Encounter Date: 11/10/2017  OT End of Session - 11/10/17 1205    Visit Number  5    Number of Visits  24    Date for OT Re-Evaluation  01/22/18    Authorization Type  [employer] Medicare.  $20 copay each OT/PT    OT Start Time  1101    OT Stop Time  1147    OT Time Calculation (min)  46 min    Activity Tolerance  Patient tolerated treatment well       Past Medical History:  Diagnosis Date  . Arthritis    "mild in my hands" (06/04/2017)  . Complication of anesthesia    "was told never to use Propofol because of parent's adverse reaction" (06/04/2017)  . CVA (cerebral vascular accident) ([place])    hospitalized from 12/22-12/24 due to left-sided weakness, slurred speech and difficulty swallowing. He was diagnosed as left pontine stroke./notes 06/03/2017  . Family history of adverse reaction to anesthesia    "mother and dad had allergic reaction Propofol" (06/04/2017)  . Glaucoma, both eyes   . Gout   . Hypertension   . Parotid mass   . Type 2 diabetes mellitus with hyperglycemia [facility])     Past Surgical History:  Procedure Laterality Date  . TONSILLECTOMY  1943    There were no vitals filed for this visit.  Subjective Assessment - 11/10/17 1105    Subjective   I am doing more of dressing at home now and I walked in today (didn't use wheelchair)    Patient is accompained by:  Family member wife    Pertinent History  Pt with CVA in December of 2018.  Rehab at SNF then [facility].      Currently in Pain?  No/denies had some pain in my R arm but I took tylenol and it went away                   OT Treatments/Exercises (OP) - 11/10/17 0001      ADLs   UB Dressing   Pt mod I with donning and doffing pull over T shirt.      LB Dressing  Pt and wife report pt can now put on his own pants and socks and shoes. Wife reports that she only assists with pull ups and pt only wears them when they go out "just in case.  Then I usually pull up his pants just because I am already there" Pt reports that he can hike pull ups and pants when he goes to the bathroom and pt' s wife said "I didn't realize that."  Encouraged wife to allow pt to complete this task at home unless he asks for assistance. Pt and wife verbalized understanding and agreement.      ADL Comments  Pt reports that he has intermitent pain in R shoulder and that it is usually worse in the morning.  Reviewed and demonstrated bed positioning on back and on L side and wife took pictures with cell phone.  Wife and pt report they are ready to give up hospital bed and wife states she will likely get a full bed for pt.  Pt able to roll to both sides, bridge, scoot and go from  sitting to supine back to sitting without assist today.        Neurological Re-education Exercises   Other Exercises 1  Neuro re ed following manual therapy to address mid reach in supine closed chain with large ball - pt needed min faciltation for full elbow extension for RUE however had no pain working at 90* of shoulder flexion with this activity.  Wife to purchase ball and will include in HEP next session.        Manual Therapy   Manual therapy comments  Joint, soft tisue and scapular mob in supine for R shoulder as well as gentle mobs to humeral head to improve alignment and work toward increased pain free ROM.  Pt reports "muscle pain not joint pain"  and often tenses in anticipation of pain.               OT Education - 11/10/17 1202    Education provided  Yes    Education Details  bed positioning for RUE    Person(s) Educated  Patient;Spouse    Methods  Explanation;Demonstration;Other (comment) wife took pictures on cell phone    wife took pictures on cell phone   Comprehension  Verbalized understanding       OT Short Term Goals - 11/10/17 1203      OT [place] #1   Title  Patient will tshirt with set up assistance due 11/23/17    Status  Achieved      OT SHORT TERM GOAL #2   Title  Patient will don socks with min assist    Status  Achieved      OT SHORT TERM GOAL #3   Title  Patient will don pants with min assistance    Status  Achieved      OT SHORT TERM GOAL #4   Title  Patient will complete HEP designed to improve shoulder range of motion with min assist and cueing    Status  On-going        OT Long Term Goals - 11/10/17 1204      OT LONG TERM GOAL #1   Title  Patient will dress himself with only occasional min assistance    Status  On-going      OT LONG TERM GOAL #2   Title  Patient will transfer into and out of shower with supervision    Status  On-going      OT LONG TERM GOAL #3   Title  Patient will feed himself, and cut up his own food with set up assistance    Status  On-going      OT LONG TERM GOAL #4   Title  Patient will demonstrate at keast 5 degrees additional PIP flexion in digits 2-5 of right hand to aide in grasp and pinch use.      Status  On-going      OT LONG TERM GOAL #5   Title  Patient will demonstrate active supination at least 10 degrees beyond neutral to aide in orienting hand to object needed for functional task    Status  On-going      OT LONG TERM GOAL #6   Title  Patient will demonstrate active low reach in right arm to obtain lightweight (1 lb or less)  item from countertop or tabletop if standing    Status  On-going      OT LONG TERM GOAL #7   Title  Patient will demonstrate at leaast 90 degrees  of passive shoulder flexion or abduction with pain no greater than 2/10 to aide with hygiene and dressing tasks    Status  On-going            Plan - 11/10/17 1204    Clinical Impression Statement  Pt progressing toward goals. Pt with improved  independence in ADL's at home.     Occupational Profile and client history currently impacting functional performance  Husband, father - son with significant psychiatric dx- currently stable on medication, retired Quarry manager, Financial trader, Geophysicist/field seismologist, and Insurance claims handler performance deficits (Please refer to evaluation for details):  ADL's;IADL's;Rest and Sleep;Leisure;Social Participation    Rehab Potential  Fair    Current Impairments/barriers affecting progress:  concern for RSD right UE     OT Frequency  2x / week    OT Duration  12 weeks    OT Treatment/Interventions  Self-care/ADL training;Electrical Stimulation;Therapeutic exercise;Visual/perceptual remediation/compensation;Patient/family education;Splinting;Neuromuscular education;Paraffin;Moist Heat;Aquatic Therapy;Traction;Fluidtherapy;Functional Mobility Training;Scar mobilization;Therapeutic activities;Balance training;Energy conservation;Cognitive remediation/compensation;Passive range of motion;Manual Therapy;DME and/or AE instruction;Contrast Bath;Ultrasound;Cryotherapy    Plan  Check list for right hand function, NMR/manual therapy for  R shoulder,     Consulted and Agree with Plan of Care  Patient;Family member/caregiver    Family Member Consulted  wife - [name]       Patient will benefit from skilled therapeutic intervention in order to improve the following deficits and impairments:  Decreased cognition, Decreased knowledge of use of DME, Decreased skin integrity, Increased edema, Impaired flexibility, Impaired vision/preception, Pain, Abnormal gait, Cardiopulmonary status limiting activity, Decreased coordination, Decreased mobility, Increased fascial restrictions, Impaired sensation, Improper body mechanics, Decreased activity tolerance, Decreased endurance, Decreased range of motion, Decreased strength, Impaired tone, Improper spinal/pelvic alignment, Decreased balance, Decreased knowledge of precautions,  Decreased safety awareness, Difficulty walking, Impaired perceived functional ability, Impaired UE functional use  Visit Diagnosis: Stiffness of right shoulder, not elsewhere classified  Stiffness of right wrist, not elsewhere classified  Stiffness of right hand, not elsewhere classified  Visuospatial deficit  Localized edema  Cognitive social or emotional deficit following cerebral infarction  Hemiplegia and hemiparesis following cerebral infarction affecting right dominant side (HCC)  Unsteadiness on feet  Muscle weakness (generalized)  Abnormal posture    Problem List Patient Active Problem List   Diagnosis Date Noted  . Weight loss, unintentional 11/02/2017  . Candidiasis of scrotum 11/02/2017  . Diastolic dysfunction   . Dysphagia, post-stroke   . CKD (chronic kidney disease), stage III ([place]) 06/03/2017  . 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 Stroke (cerebrum) ([place]) 06/03/2017  . CVA (cerebral vascular accident) ([place]) 06/01/2017  . TIA (transient ischemic attack) 05/31/2017  . Fatigue 05/08/2017  . Aortic atherosclerosis ([place]) 05/08/2017  . Glaucoma 05/06/2017  . Lipoma of forehead 11/02/2016  . Type II diabetes mellitus with renal manifestations ([place]) 11/02/2016  . Hyperlipidemia LDL goal <100 11/02/2016  . Constipation in male 11/02/2016  . Insomnia due to anxiety and fear 11/02/2016  . Colon cancer screening 05/04/2015  . Vitamin D deficiency 05/02/2015  . Medicare annual wellness visit, subsequent 06/23/2013  . Essential hypertension, benign 03/22/2013  . Osteoarthritis 03/22/2013  . Numbness and tingling in left hand 03/22/2013  . GERD (gastroesophageal reflux disease) 03/22/2013  . BPH (benign prostatic hyperplasia) 03/22/2013    Quay Burow, OTR/L 11/10/2017, 12:09 PM  [facility] [address] [place], [place], [address] Phone: [phone]   Fax:  [phone]  Name:  GERRELL TABET MRN: [id number] Date of  Birth: [birth year]

## 2017-11-10 NOTE — Therapy (Signed)
[facility] [address] [place], [place], [address] Phone: [phone]   Fax:  [phone]  Physical Therapy Treatment  Patient Details  Name: Anthony Allen MRN: [id number] Date of Birth: 1935/08/14 Referring Provider: Venancio Poisson   Encounter Date: 11/10/2017  PT End of Session - 11/10/17 1309    Visit Number  5    Number of Visits  17    Date for PT Re-Evaluation  12/23/17    Authorization Type  [employer]HC Medicare    PT Start Time  1148    PT Stop Time  1231    PT Time Calculation (min)  43 min    Equipment Utilized During Treatment  Gait belt    Activity Tolerance  Patient tolerated treatment well    Behavior During Therapy  WFL for tasks assessed/performed       Past Medical History:  Diagnosis Date  . Arthritis    "mild in my hands" (06/04/2017)  . Complication of anesthesia    "was told never to use Propofol because of parent's adverse reaction" (06/04/2017)  . CVA (cerebral vascular accident) ([place])    hospitalized from 12/22-12/24 due to left-sided weakness, slurred speech and difficulty swallowing. He was diagnosed as left pontine stroke./notes 06/03/2017  . Family history of adverse reaction to anesthesia    "mother and dad had allergic reaction Propofol" (06/04/2017)  . Glaucoma, both eyes   . Gout   . Hypertension   . Parotid mass   . Type 2 diabetes mellitus with hyperglycemia [facility])     Past Surgical History:  Procedure Laterality Date  . TONSILLECTOMY  1943    There were no vitals filed for this visit.  Subjective Assessment - 11/10/17 1210    Subjective  Pt reports getting in and out of wife's bed, however did not sleep there.     Patient is accompained by:  Family member    Pertinent History  glaucoma    Limitations  House hold activities;Walking;Standing    Patient Stated Goals  "walk better."     Currently in Pain?  No/denies                       [facility] Adult  PT Treatment/Exercise - 11/10/17 1222      Ambulation/Gait   Ambulation/Gait  Yes    Ambulation/Gait Assistance  4: Min guard;4: Min assist    Ambulation/Gait Assistance Details  Continue to address gait with use of tripod cane (someone else using quad tip cane) with use of tennis shoes with and without foot up brace.  Pt reports he has similar brace at home, but doesn't use anymore.  Discussed that he likely does not need with RW due to increased support from RW, however note during gait with tripod/quad tip cane, esp with increased fatigue, he tends to catch R foot intermittently.  Donned foot up brace for another 115' indoors with tripod cane and note marked improvement, however once he continued ambulation outdoors and back in, R LE continued to have increased weakness/fatigue and pt needing continous min A and cues for increased R hip/knee flex to avoid catching R foot.  Cues throughout for upright posture and sequencing, esp going up/down inclines.  Pts wife would like to order foot up brace, therefore provided her with information on how to order on [place].      Ambulation Distance (Feet)  230 Feet indoors, 250' outdoors    Assistive device  -- tripod  cane    Gait Pattern  Step-through pattern;Decreased arm swing - right;Decreased arm swing - left;Decreased stride length;Decreased dorsiflexion - right;Decreased weight shift to right;Right foot flat;Trunk flexed;Narrow base of support    Ambulation Surface  Level;Unlevel;Indoor;Outdoor;Paved    Stairs  Yes    Stairs Assistance  4: Min assist    Stairs Assistance Details (indicate cue type and reason)  Simulated two steps (using aerobic steps with stackers-note he has 3 at home) to get into man cave.  Performed without rails but with tripod cane.  Pt able to perform at min A level with R HHA from PT.  Discussed with him and wife that we would practice this more but he would likely be safer with use of quad cane for increased support, but again, we  can practice in therapy before they do at home. Both verbalized understanding.     Stair Management Technique  No rails;Step to pattern;Forwards;With cane    Number of Stairs  2    Height of Stairs  3 and 6"    Door Management  4: Min assist    Door Managment Details (indicate cue type and reason)  Pt needs guiding assist but was able to grasp and pull latch handle down and begin to push forward to get outdoors.     Curb  4: Min assist    Curb Details (indicate cue type and reason)  cues for step through technique to avoid LOB.  Pt able to do at min A level, however note that he often catches R foot on curb step.  Had him try leading with RLE, was able to do but also demonstrates needs for assist and decreased R LE extension when ascending.  Performed x 3 reps with varying techniques.      Self-Care   Self-Care  Other Self-Care Comments    Other Self-Care Comments   PT continued to encourage pt to ambulate more often in community with RW (ie MD appts, pharmacy visits, small shopping trips) to increase endurance.  Also briefly discussed attending senior center or [facility] to do seated endurance exercise machine (nustep).  Will continue to address in future sessions.              PT Education - 11/10/17 1309    Education provided  Yes    Education Details  see self care    Person(s) Educated  Patient;Spouse    Methods  Explanation    Comprehension  Verbalized understanding       PT Short Term Goals - 10/24/17 1250      PT SHORT TERM GOAL #1   Title  Pt/wife will initiate HEP in order to indicate improved functional mobility (Target date: 11/23/17)    Time  4    Period  Weeks    Status  New    Target Date  11/23/17      PT SHORT TERM GOAL #2   Title  Pt will improve TUG to </=33 secs w/ LRAD at S level in order to indicate decreased fall risk.      Time  4    Period  Weeks    Status  New      PT SHORT TERM GOAL #3   Title  Will assess BERG and improve score by 3 points from  baseline in order to indicate decreased fall risk.     Time  4    Period  Weeks    Status  New  PT SHORT TERM GOAL #4   Title  Pt will improve 5TSS to </= 21 secs with single UE support only in order to indicate decreased fall risk and improved functional strength.      Time  4    Period  Weeks    Status  New      PT SHORT TERM GOAL #5   Title  Pt will improve gait speed to 1.94 ft/sec w/ LRAD at S level in order to indicate decreased fall risk.      Time  4    Period  Weeks    Status  New      Additional Short Term Goals   Additional Short Term Goals  Yes      PT SHORT TERM GOAL #6   Title  Pt will ambulate short household distances (up to 50') w/ quad cane at S level in order to indicate improved independence at home.     Time  4    Period  Weeks    Status  New        PT Long Term Goals - 10/24/17 1256      PT LONG TERM GOAL #1   Title  Pt/wife will be independent with final HEP in order to indicate improved functional mobility and decreased fall risk. (Target Date: 12/23/17)    Time  8    Period  Weeks    Status  New    Target Date  12/23/17      PT LONG TERM GOAL #2   Title  Pt will improve BERG balance score by 6 points from baseline in order to indicate decreased fall risk.     Time  8    Period  Weeks    Status  New      PT LONG TERM GOAL #3   Title  Pt will improve TUG time to </=30 secs w/ LRAD at mod I level in order to indicate decreased fall risk.      Time  8    Period  Weeks    Status  New      PT LONG TERM GOAL #4   Title  Pt will improve 5TSS to </=19 secs without UE support in order to indicate improved functional strength and decreased fall risk.      Time  8    Period  Weeks    Status  New      PT LONG TERM GOAL #5   Title  Pt will ambulate x 500' w/ LRAD over indoor surfaces at mod I level in order to indicate improved functional mobility.      Time  8    Period  Weeks    Status  New      Additional Long Term Goals   Additional  Long Term Goals  Yes      PT LONG TERM GOAL #6   Title  Pt will ambulate x 300' over unlevel paved outdoor surfaces w/ LRAD at S level in order to indicate improved community negotiation.      Time  8    Period  Weeks    Status  New            Plan - 11/10/17 1310    Clinical Impression Statement  Skilled session focused on gait training with use of tripod cane (would prefer quad tip but was in use during session) to improve quality and endurance.  Note that PT had pt bring lace  up shoes to trial use of foot up brace, esp as pt progressing to LRAD and with fatigue.  Pt reports he has something similar but prefers foot up brace used in session, therefore provided education to wife on how to order online.  Pt progressing well, but demonstrates decreased endurance.     Rehab Potential  Good    Clinical Impairments Affecting Rehab Potential  Pt very motivated     PT Frequency  2x / week    PT Duration  8 weeks    PT Treatment/Interventions  ADLs/Self Care Home Management;Electrical Stimulation;DME Instruction;Gait training;Stair training;Functional mobility training;Therapeutic activities;Therapeutic exercise;Balance training;Neuromuscular re-education;Patient/family education;Orthotic Fit/Training;Passive range of motion;Vestibular    PT Next Visit Plan  Continue gait with quad tip cane, foot up brace (his if they got), stairs with cane, gait with obstacles, balance, gait with LRAD (has used quad cane at home with HHPT but Merrilee Jansky 41/58 & recommend cont RW at home).  watch to see if we need to resume AFO    Consulted and Agree with Plan of Care  Patient;Family member/caregiver    Family Member Consulted  wife [place] (goes by [employer])       Patient will benefit from skilled therapeutic intervention in order to improve the following deficits and impairments:  Abnormal gait, Decreased activity tolerance, Decreased balance, Decreased coordination, Decreased endurance, Decreased knowledge of use of  DME, Decreased mobility, Decreased range of motion, Decreased strength, Impaired perceived functional ability, Impaired flexibility, Improper body mechanics, Postural dysfunction, Impaired UE functional use  Visit Diagnosis: Hemiplegia and hemiparesis following cerebral infarction affecting right dominant side (HCC)  Unsteadiness on feet  Muscle weakness (generalized)  Abnormal posture  Other abnormalities of gait and mobility     Problem List Patient Active Problem List   Diagnosis Date Noted  . Weight loss, unintentional 11/02/2017  . Candidiasis of scrotum 11/02/2017  . Diastolic dysfunction   . Dysphagia, post-stroke   . CKD (chronic kidney disease), stage III ([place]) 06/03/2017  . 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 Stroke (cerebrum) ([place]) 06/03/2017  . CVA (cerebral vascular accident) ([place]) 06/01/2017  . TIA (transient ischemic attack) 05/31/2017  . Fatigue 05/08/2017  . Aortic atherosclerosis ([place]) 05/08/2017  . Glaucoma 05/06/2017  . Lipoma of forehead 11/02/2016  . Type II diabetes mellitus with renal manifestations ([place]) 11/02/2016  . Hyperlipidemia LDL goal <100 11/02/2016  . Constipation in male 11/02/2016  . Insomnia due to anxiety and fear 11/02/2016  . Colon cancer screening 05/04/2015  . Vitamin D deficiency 05/02/2015  . Medicare annual wellness visit, subsequent 06/23/2013  . Essential hypertension, benign 03/22/2013  . Osteoarthritis 03/22/2013  . Numbness and tingling in left hand 03/22/2013  . GERD (gastroesophageal reflux disease) 03/22/2013  . BPH (benign prostatic hyperplasia) 03/22/2013    Cameron Sprang, PT, MPT [facility] [address] [place], [place], [address] Phone: [phone]   Fax:  [phone] 11/10/17, 1:12 PM  Name: Anthony Allen MRN: [id number] Date of Birth: [birth year]

## 2017-11-11 ENCOUNTER — Ambulatory Visit: Payer: Medicare Other | Admitting: Internal Medicine

## 2017-11-11 NOTE — Progress Notes (Signed)
I agree with the above plan 

## 2017-11-12 ENCOUNTER — Ambulatory Visit: Payer: Medicare Other | Admitting: Psychology

## 2017-11-12 DIAGNOSIS — F4322 Adjustment disorder with anxiety: Secondary | ICD-10-CM

## 2017-11-13 ENCOUNTER — Telehealth: Payer: Self-pay | Admitting: Rehabilitation

## 2017-11-13 ENCOUNTER — Ambulatory Visit: Payer: Medicare Other | Admitting: Rehabilitation

## 2017-11-13 ENCOUNTER — Ambulatory Visit: Payer: Medicare Other | Admitting: Occupational Therapy

## 2017-11-13 ENCOUNTER — Encounter: Payer: Self-pay | Admitting: Rehabilitation

## 2017-11-13 ENCOUNTER — Encounter: Payer: Self-pay | Admitting: Occupational Therapy

## 2017-11-13 DIAGNOSIS — M25611 Stiffness of right shoulder, not elsewhere classified: Secondary | ICD-10-CM

## 2017-11-13 DIAGNOSIS — R2689 Other abnormalities of gait and mobility: Secondary | ICD-10-CM

## 2017-11-13 DIAGNOSIS — I69351 Hemiplegia and hemiparesis following cerebral infarction affecting right dominant side: Secondary | ICD-10-CM

## 2017-11-13 DIAGNOSIS — M6281 Muscle weakness (generalized): Secondary | ICD-10-CM

## 2017-11-13 DIAGNOSIS — I69315 Cognitive social or emotional deficit following cerebral infarction: Secondary | ICD-10-CM

## 2017-11-13 DIAGNOSIS — R2681 Unsteadiness on feet: Secondary | ICD-10-CM

## 2017-11-13 DIAGNOSIS — R293 Abnormal posture: Secondary | ICD-10-CM

## 2017-11-13 DIAGNOSIS — I639 Cerebral infarction, unspecified: Secondary | ICD-10-CM

## 2017-11-13 DIAGNOSIS — M25641 Stiffness of right hand, not elsewhere classified: Secondary | ICD-10-CM

## 2017-11-13 DIAGNOSIS — M25631 Stiffness of right wrist, not elsewhere classified: Secondary | ICD-10-CM

## 2017-11-13 DIAGNOSIS — I6350 Cerebral infarction due to unspecified occlusion or stenosis of unspecified cerebral artery: Secondary | ICD-10-CM

## 2017-11-13 DIAGNOSIS — R41842 Visuospatial deficit: Secondary | ICD-10-CM

## 2017-11-13 DIAGNOSIS — R600 Localized edema: Secondary | ICD-10-CM

## 2017-11-13 NOTE — Therapy (Signed)
[facility] [address] [place], [place], [address] Phone: [phone]   Fax:  [phone]  Physical Therapy Treatment  Patient Details  Name: Anthony Allen MRN: [id number] Date of Birth: [birth year] Referring Provider: Venancio Poisson   Encounter Date: 11/13/2017  PT End of Session - 11/13/17 1333    Visit Number  6    Number of Visits  17    Date for PT Re-Evaluation  12/23/17    Authorization Type  [employer] Medicare    PT Start Time  1316    PT Stop Time  1400    PT Time Calculation (min)  44 min    Equipment Utilized During Treatment  Gait belt    Activity Tolerance  Patient tolerated treatment well    Behavior During Therapy  [facility] for tasks assessed/performed       Past Medical History:  Diagnosis Date  . Arthritis    "mild in my hands" (06/04/2017)  . Complication of anesthesia    "was told never to use Propofol because of parent's adverse reaction" (06/04/2017)  . CVA (cerebral vascular accident) ([place])    hospitalized from 12/22-12/24 due to left-sided weakness, slurred speech and difficulty swallowing. He was diagnosed as left pontine stroke./notes 06/03/2017  . Family history of adverse reaction to anesthesia    "mother and dad had allergic reaction Propofol" (06/04/2017)  . Glaucoma, both eyes   . Gout   . Hypertension   . Parotid mass   . Type 2 diabetes mellitus with hyperglycemia [facility])     Past Surgical History:  Procedure Laterality Date  . TONSILLECTOMY  1943    There were no vitals filed for this visit.  Subjective Assessment - 11/13/17 1329    Subjective  Pt reports going to psychologist yesterday.      Patient is accompained by:  Family member    Pertinent History  glaucoma    Limitations  House hold activities;Walking;Standing    Currently in Pain?  No/denies                       [facility] Adult PT Treatment/Exercise - 11/13/17 0001      Ambulation/Gait    Ambulation/Gait  Yes    Ambulation/Gait Assistance  4: Min guard    Ambulation/Gait Assistance Details  Continue to work on gait with use of quad tip cane along with use of his new foot up brace.  Feel that R foot control is largely due to R inattention, but feel that foot up brace increases attention to R foot during gait.  Also encouraged more fluid gait pattern during session.  Pt continues to need min/guard for intermittent steadying, but feel that he can begin to do at home for exercise with assist from wife with gait belt.  Both verbalized understanding.      Ambulation Distance (Feet)  115 Feet    Assistive device  -- quad tip cane    Gait Pattern  Step-through pattern;Decreased arm swing - right;Decreased arm swing - left;Decreased stride length;Decreased dorsiflexion - right;Decreased weight shift to right;Right foot flat;Trunk flexed;Narrow base of support    Ambulation Surface  Level;Indoor    Ramp  Other (comment) min/guard    Curb  4: Min assist    Curb Details (indicate cue type and reason)  Performed ramp/curb in clinic with quad tip cane.  Pt mostly min/guard level but still needs min cues for correct sequencing and technique.  Feel that  he is safer at this time when ascending curb to lead with RLE to avoid R toe catching on curb step.  Pt verbalized understanding.  Performed x 4 reps during session.       Neuro Re-ed    Neuro Re-ed Details   NMR for improved postural control, equal LE WB, and forced use of R LE.  Had pt in tall kneeling with UE support on green physioball performing squats x 5 reps progressing to performing without UE support x 5 rpes with cues for slow controlled movement and improved hip extension along with improved R weight shift, esp when moving into flexed position.  Progressed to having pt in L half kneel for R proximal hip control and WB performing forward/post weight shift (also for R hip flex stretch) x 5 reps.  Ended with transitional movements from tall  kneeling to L half kneeling x 3 reps with facilitation for posture and improved R lateral weight shift and R hip protraction.               PT Education - 11/13/17 1946    Education provided  Yes    Education Details  starting to ambulate in home for exercise with quad tip cane with assist from wife (with foot up brace)    Person(s) Educated  Patient;Spouse    Methods  Explanation    Comprehension  Verbalized understanding       PT Short Term Goals - 10/24/17 1250      PT SHORT TERM GOAL #1   Title  Pt/wife will initiate HEP in order to indicate improved functional mobility (Target date: 11/23/17)    Time  4    Period  Weeks    Status  New    Target Date  11/23/17      PT SHORT TERM GOAL #2   Title  Pt will improve TUG to </=33 secs w/ LRAD at S level in order to indicate decreased fall risk.      Time  4    Period  Weeks    Status  New      PT SHORT TERM GOAL #3   Title  Will assess BERG and improve score by 3 points from baseline in order to indicate decreased fall risk.     Time  4    Period  Weeks    Status  New      PT SHORT TERM GOAL #4   Title  Pt will improve 5TSS to </= 21 secs with single UE support only in order to indicate decreased fall risk and improved functional strength.      Time  4    Period  Weeks    Status  New      PT SHORT TERM GOAL #5   Title  Pt will improve gait speed to 1.94 ft/sec w/ LRAD at S level in order to indicate decreased fall risk.      Time  4    Period  Weeks    Status  New      Additional Short Term Goals   Additional Short Term Goals  Yes      PT SHORT TERM GOAL #6   Title  Pt will ambulate short household distances (up to 50') w/ quad cane at S level in order to indicate improved independence at home.     Time  4    Period  Weeks    Status  New  PT Long Term Goals - 10/24/17 1256      PT LONG TERM GOAL #1   Title  Pt/wife will be independent with final HEP in order to indicate improved functional  mobility and decreased fall risk. (Target Date: 12/23/17)    Time  8    Period  Weeks    Status  New    Target Date  12/23/17      PT LONG TERM GOAL #2   Title  Pt will improve BERG balance score by 6 points from baseline in order to indicate decreased fall risk.     Time  8    Period  Weeks    Status  New      PT LONG TERM GOAL #3   Title  Pt will improve TUG time to </=30 secs w/ LRAD at mod I level in order to indicate decreased fall risk.      Time  8    Period  Weeks    Status  New      PT LONG TERM GOAL #4   Title  Pt will improve 5TSS to </=19 secs without UE support in order to indicate improved functional strength and decreased fall risk.      Time  8    Period  Weeks    Status  New      PT LONG TERM GOAL #5   Title  Pt will ambulate x 500' w/ LRAD over indoor surfaces at mod I level in order to indicate improved functional mobility.      Time  8    Period  Weeks    Status  New      Additional Long Term Goals   Additional Long Term Goals  Yes      PT LONG TERM GOAL #6   Title  Pt will ambulate x 300' over unlevel paved outdoor surfaces w/ LRAD at S level in order to indicate improved community negotiation.      Time  8    Period  Weeks    Status  New            Plan - 11/13/17 1333    Clinical Impression Statement  Skilled session continued with gait training with quad tip cane, including negotiation up/down ramp and curb step with use of tennis shoes and foot up brace (that they now have).  Feel that R foot control greatly impacted by R inattention.  Would like to set pt up with Bioness at next session to provide increased input for improved DF.  Also continue to address R NMR in session in tall kneeling. Pt tolerated very well.      Rehab Potential  Good    Clinical Impairments Affecting Rehab Potential  Pt very motivated     PT Frequency  2x / week    PT Duration  8 weeks    PT Treatment/Interventions  ADLs/Self Care Home Management;Electrical  Stimulation;DME Instruction;Gait training;Stair training;Functional mobility training;Therapeutic activities;Therapeutic exercise;Balance training;Neuromuscular re-education;Patient/family education;Orthotic Fit/Training;Passive range of motion;Vestibular    PT Next Visit Plan  Set up Bioness for R DF assist (not sure that we need hamstring yet), did they begin to practice gait at home with quad tip cane? Continue gait with quad tip cane, foot up brace (his if they got), stairs with cane, gait with obstacles, balance, gait with LRAD (has used quad cane at home with HHPT but Merrilee Jansky 41/58 & recommend cont RW at home).  watch to see if we  need to resume AFO    Consulted and Agree with Plan of Care  Patient;Family member/caregiver    Family Member Consulted  wife Baldwin [place] (goes by [employer])       Patient will benefit from skilled therapeutic intervention in order to improve the following deficits and impairments:  Abnormal gait, Decreased activity tolerance, Decreased balance, Decreased coordination, Decreased endurance, Decreased knowledge of use of DME, Decreased mobility, Decreased range of motion, Decreased strength, Impaired perceived functional ability, Impaired flexibility, Improper body mechanics, Postural dysfunction, Impaired UE functional use  Visit Diagnosis: Hemiplegia and hemiparesis following cerebral infarction affecting right dominant side (HCC)  Unsteadiness on feet  Muscle weakness (generalized)  Abnormal posture  Other abnormalities of gait and mobility     Problem List Patient Active Problem List   Diagnosis Date Noted  . Weight loss, unintentional 11/02/2017  . Candidiasis of scrotum 11/02/2017  . Diastolic dysfunction   . Dysphagia, post-stroke   . CKD (chronic kidney disease), stage III ([place]) 06/03/2017  . 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 Stroke (cerebrum) ([place]) 06/03/2017  . CVA (cerebral vascular accident) ([place]) 06/01/2017  . TIA (transient ischemic attack)  05/31/2017  . Fatigue 05/08/2017  . Aortic atherosclerosis ([place]) 05/08/2017  . Glaucoma 05/06/2017  . Lipoma of forehead 11/02/2016  . Type II diabetes mellitus with renal manifestations ([place]) 11/02/2016  . Hyperlipidemia LDL goal <100 11/02/2016  . Constipation in male 11/02/2016  . Insomnia due to anxiety and fear 11/02/2016  . Colon cancer screening 05/04/2015  . Vitamin D deficiency 05/02/2015  . Medicare annual wellness visit, subsequent 06/23/2013  . Essential hypertension, benign 03/22/2013  . Osteoarthritis 03/22/2013  . Numbness and tingling in left hand 03/22/2013  . GERD (gastroesophageal reflux disease) 03/22/2013  . BPH (benign prostatic hyperplasia) 03/22/2013    Cameron Sprang, PT, MPT [facility] [address] [place], [place], [address] Phone: [phone]   Fax:  [phone] 11/13/17, 7:59 PM  Name: Anthony Allen MRN: [id number] Date of Birth: [birth year]

## 2017-11-13 NOTE — Telephone Encounter (Signed)
Anthony Allen,   I am seeing Mr. Bracen Schum at [facility] for PT.  They have mentioned that they would like to be referred to neuropsychology services.  I am wondering if you would write a referral for Ilean Skill at [facility].  Pt's wife okay with this location as well.    Thanks,  Cameron Sprang, PT, MPT [facility] [address] [place], [place], [address] Phone: [phone]   Fax:  [phone] 11/13/17, 8:33 PM

## 2017-11-13 NOTE — Therapy (Signed)
[facility] [address] [place], [place], [address] Phone: [phone]   Fax:  [phone]  Occupational Therapy Treatment  Patient Details  Name: Anthony Allen MRN: [id number] Date of Birth: [birth year] Referring Provider: Venancio Poisson   Encounter Date: 11/13/2017  OT End of Session - 11/13/17 1729    Visit Number  6    Number of Visits  24    Date for OT Re-Evaluation  01/22/18    Authorization Type  [employer]HC Medicare.  $20 copay each OT/PT    OT Start Time  1401    OT Stop Time  1445    OT Time Calculation (min)  44 min    Activity Tolerance  Patient tolerated treatment well       Past Medical History:  Diagnosis Date  . Arthritis    "mild in my hands" (06/04/2017)  . Complication of anesthesia    "was told never to use Propofol because of parent's adverse reaction" (06/04/2017)  . CVA (cerebral vascular accident) ([place])    hospitalized from 12/22-12/24 due to left-sided weakness, slurred speech and difficulty swallowing. He was diagnosed as left pontine stroke./notes 06/03/2017  . Family history of adverse reaction to anesthesia    "mother and dad had allergic reaction Propofol" (06/04/2017)  . Glaucoma, both eyes   . Gout   . Hypertension   . Parotid mass   . Type 2 diabetes mellitus with hyperglycemia [facility])     Past Surgical History:  Procedure Laterality Date  . TONSILLECTOMY  1943    There were no vitals filed for this visit.  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  Rehab at SNF then [facility].      Currently in Pain?  No/denies                   OT Treatments/Exercises (OP) - 11/13/17 0001      ADLs   Cooking  Addressed simple cold beverage prep at ambulatory level with toe up on R foot and using cane with quad  tip.  Pt required supevsion only. Pt able to get cup off counter and turn cup right side up.  Pt also able to use R hand to drink as well.  Pt needed close supervision to intermittent contact guard for balance.      Home Maintenance  Addressed folding washclothes, pillow cases and bath towels in standing using a counter. Pt able to complete with mod cues for attention to R hand as well as for perceptual organization.                 OT Short Term Goals - 11/13/17 1727      OT SHORT TERM GOAL #1   Title  Patient will tshirt with set up assistance due 11/23/17    Status  Achieved      OT SHORT TERM GOAL #2   Title  Patient will don socks with min assist    Status  Achieved      OT SHORT TERM GOAL #3   Title  Patient will don pants with min assistance    Status  Achieved      OT SHORT TERM GOAL #4   Title  Patient will complete HEP designed to  improve shoulder range of motion with min assist and cueing    Status  On-going        OT Long Term Goals - 11/13/17 1727      OT LONG TERM GOAL #1   Title  Patient will dress himself with only occasional min assistance    Status  On-going      OT LONG TERM GOAL #2   Title  Patient will transfer into and out of shower with supervision    Status  On-going      OT LONG TERM GOAL #3   Title  Patient will feed himself, and cut up his own food with set up assistance    Status  On-going      OT LONG TERM GOAL #4   Title  Patient will demonstrate at keast 5 degrees additional PIP flexion in digits 2-5 of right hand to aide in grasp and pinch use.      Status  On-going      OT LONG TERM GOAL #5   Title  Patient will demonstrate active supination at least 10 degrees beyond neutral to aide in orienting hand to object needed for functional task    Status  On-going      OT LONG TERM GOAL #6   Title  Patient will demonstrate active low reach in right arm to obtain lightweight (1 lb or less)  item from countertop or tabletop if standing     Status  On-going      OT LONG TERM GOAL #7   Title  Patient will demonstrate at leaast 90 degrees of passive shoulder flexion or abduction with pain no greater than 2/10 to aide with hygiene and dressing tasks    Status  On-going            Plan - 11/13/17 1727    Clinical Impression Statement  Pt continues to progress toward goals. Pt with improving balance and functional use of RUE with cues    Occupational Profile and client history currently impacting functional performance  Husband, father - son with significant psychiatric dx- currently stable on medication, retired Quarry manager, Financial trader, Geophysicist/field seismologist, and Insurance claims handler performance deficits (Please refer to evaluation for details):  ADL's;IADL's;Rest and Sleep;Leisure;Social Participation    Rehab Potential  Fair    Current Impairments/barriers affecting progress:  concern for RSD right UE     OT Frequency  2x / week    OT Duration  12 weeks    OT Treatment/Interventions  Self-care/ADL training;Electrical Stimulation;Therapeutic exercise;Visual/perceptual remediation/compensation;Patient/family education;Splinting;Neuromuscular education;Paraffin;Moist Heat;Aquatic Therapy;Traction;Fluidtherapy;Functional Mobility Training;Scar mobilization;Therapeutic activities;Balance training;Energy conservation;Cognitive remediation/compensation;Passive range of motion;Manual Therapy;DME and/or AE instruction;Contrast Bath;Ultrasound;Cryotherapy    Plan  HEP for proximal ROM with ball in supine, Check list for right hand function, NMR/manual therapy for  R shoulder, address self feeding, add to HEP    Consulted and Agree with Plan of Care  Patient;Family member/caregiver    Family Member Consulted  wife - [name]       Patient will benefit from skilled therapeutic intervention in order to improve the following deficits and impairments:  Decreased cognition, Decreased knowledge of use of DME, Decreased skin  integrity, Increased edema, Impaired flexibility, Impaired vision/preception, Pain, Abnormal gait, Cardiopulmonary status limiting activity, Decreased coordination, Decreased mobility, Increased fascial restrictions, Impaired sensation, Improper body mechanics, Decreased activity tolerance, Decreased endurance, Decreased range of motion, Decreased strength, Impaired tone, Improper spinal/pelvic alignment, Decreased balance, Decreased knowledge of precautions, Decreased safety awareness, Difficulty walking, Impaired  perceived functional ability, Impaired UE functional use  Visit Diagnosis: Stiffness of right shoulder, not elsewhere classified  Stiffness of right wrist, not elsewhere classified  Stiffness of right hand, not elsewhere classified  Visuospatial deficit  Localized edema  Cognitive social or emotional deficit following cerebral infarction  Hemiplegia and hemiparesis following cerebral infarction affecting right dominant side (HCC)  Unsteadiness on feet  Muscle weakness (generalized)  Abnormal posture    Problem List Patient Active Problem List   Diagnosis Date Noted  . Weight loss, unintentional 11/02/2017  . Candidiasis of scrotum 11/02/2017  . Diastolic dysfunction   . Dysphagia, post-stroke   . CKD (chronic kidney disease), stage III ([place]) 06/03/2017  . 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 Stroke (cerebrum) ([place]) 06/03/2017  . CVA (cerebral vascular accident) ([place]) 06/01/2017  . TIA (transient ischemic attack) 05/31/2017  . Fatigue 05/08/2017  . Aortic atherosclerosis ([place]) 05/08/2017  . Glaucoma 05/06/2017  . Lipoma of forehead 11/02/2016  . Type II diabetes mellitus with renal manifestations ([place]) 11/02/2016  . Hyperlipidemia LDL goal <100 11/02/2016  . Constipation in male 11/02/2016  . Insomnia due to anxiety and fear 11/02/2016  . Colon cancer screening 05/04/2015  . Vitamin D deficiency 05/02/2015  . Medicare annual wellness visit, subsequent  06/23/2013  . Essential hypertension, benign 03/22/2013  . Osteoarthritis 03/22/2013  . Numbness and tingling in left hand 03/22/2013  . GERD (gastroesophageal reflux disease) 03/22/2013  . BPH (benign prostatic hyperplasia) 03/22/2013    Quay Burow, OTR/L 11/13/2017, 5:30 PM  [facility] [address] [place], [place], [address] Phone: [phone]   Fax:  [phone]  Name: Anthony Allen MRN: [id number] Date of Birth: 22-Jan-1936

## 2017-11-14 NOTE — Telephone Encounter (Signed)
Order placed for neuropsychology services per request of Raquel Sarna, PT.

## 2017-11-15 ENCOUNTER — Other Ambulatory Visit: Payer: Self-pay | Admitting: Internal Medicine

## 2017-11-17 ENCOUNTER — Encounter: Payer: Self-pay | Admitting: Occupational Therapy

## 2017-11-17 ENCOUNTER — Ambulatory Visit: Payer: Medicare Other | Admitting: Rehabilitation

## 2017-11-17 ENCOUNTER — Encounter: Payer: Self-pay | Admitting: Rehabilitation

## 2017-11-17 ENCOUNTER — Ambulatory Visit: Payer: Medicare Other | Admitting: Occupational Therapy

## 2017-11-17 DIAGNOSIS — M6281 Muscle weakness (generalized): Secondary | ICD-10-CM

## 2017-11-17 DIAGNOSIS — R41842 Visuospatial deficit: Secondary | ICD-10-CM

## 2017-11-17 DIAGNOSIS — I69351 Hemiplegia and hemiparesis following cerebral infarction affecting right dominant side: Secondary | ICD-10-CM

## 2017-11-17 DIAGNOSIS — R2681 Unsteadiness on feet: Secondary | ICD-10-CM

## 2017-11-17 DIAGNOSIS — I69315 Cognitive social or emotional deficit following cerebral infarction: Secondary | ICD-10-CM

## 2017-11-17 DIAGNOSIS — R600 Localized edema: Secondary | ICD-10-CM

## 2017-11-17 DIAGNOSIS — R293 Abnormal posture: Secondary | ICD-10-CM

## 2017-11-17 DIAGNOSIS — M25631 Stiffness of right wrist, not elsewhere classified: Secondary | ICD-10-CM

## 2017-11-17 DIAGNOSIS — M25641 Stiffness of right hand, not elsewhere classified: Secondary | ICD-10-CM

## 2017-11-17 DIAGNOSIS — M25611 Stiffness of right shoulder, not elsewhere classified: Secondary | ICD-10-CM

## 2017-11-17 NOTE — Therapy (Signed)
[facility] [address] [place], [place], [address] Phone: [phone]   Fax:  [phone]  Occupational Therapy Treatment  Patient Details  Name: Anthony Allen MRN: [id number] Date of Birth: [birth year] Referring Provider: Venancio Poisson   Encounter Date: 11/17/2017  OT End of Session - 11/17/17 1417    Visit Number  7    Number of Visits  24    Date for OT Re-Evaluation  01/22/18    Authorization Type  [employer] Medicare.  $20 copay each OT/PT    Authorization - Visit Number  7    Authorization - Number of Visits  10    OT Start Time  6767    OT Stop Time  1358    OT Time Calculation (min)  42 min    Activity Tolerance  Patient tolerated treatment well    Behavior During Therapy  [facility] for tasks assessed/performed       Past Medical History:  Diagnosis Date  . Arthritis    "mild in my hands" (06/04/2017)  . Complication of anesthesia    "was told never to use Propofol because of parent's adverse reaction" (06/04/2017)  . CVA (cerebral vascular accident) ([place])    hospitalized from 12/22-12/24 due to left-sided weakness, slurred speech and difficulty swallowing. He was diagnosed as left pontine stroke./notes 06/03/2017  . Family history of adverse reaction to anesthesia    "mother and dad had allergic reaction Propofol" (06/04/2017)  . Glaucoma, both eyes   . Gout   . Hypertension   . Parotid mass   . Type 2 diabetes mellitus with hyperglycemia [facility])     Past Surgical History:  Procedure Laterality Date  . TONSILLECTOMY  1943    There were no vitals filed for this visit.  Subjective Assessment - 11/17/17 1322    Subjective   I was able to get into a regular bed this weekend and rest comfortably (vs. hospital bed)    Patient is accompained by:  Family member    Pertinent History  Pt with CVA in December of 2018.  Rehab at SNF then [facility].      Currently in Pain?  No/denies    Multiple Pain Sites  No                   OT Treatments/Exercises (OP) - 11/17/17 0001      ADLs   LB Dressing  Discussed with patient and wife the importance of increasing independence with ADL's.  Patient and wife both expressing concern for patient being able to dress himself in current bathroom due to insufficient space.  Spoke with patient and wife about after showering moving out of bathroom to dress to allow more room to move.  Both in agreement that this was reasonable.  Also discussed that patient will need extra time to attempt this initially - so plan accordingly.  OK to assist patient on days he has early am appointment.        Neurological Re-education Exercises   Other Exercises 1  Patient is gaining movement in right hand, and is beginning to use functionally.  Emphasis today was on developing HEP to begin to address more proximal RUE motion.  See patient instructions.  Will need to add - supine bilateral exercises next session.               OT Education - 11/17/17 1416    Education provided  Yes    Education Details  initiated sidelying exercises for RUE    Person(s) Educated  Patient;Spouse    Methods  Explanation;Demonstration;Tactile cues;Verbal cues;Handout    Comprehension  Verbalized understanding;Returned demonstration;Need further instruction       OT Short Term Goals - 11/13/17 1727      OT SHORT TERM GOAL #1   Title  Patient will tshirt with set up assistance due 11/23/17    Status  Achieved      OT SHORT TERM GOAL #2   Title  Patient will don socks with min assist    Status  Achieved      OT SHORT TERM GOAL #3   Title  Patient will don pants with min assistance    Status  Achieved      OT SHORT TERM GOAL #4   Title  Patient will complete HEP designed to improve shoulder range of motion with min assist and cueing    Status  On-going        OT Long Term Goals - 11/13/17 1727      OT LONG TERM GOAL #1   Title  Patient will dress himself with only  occasional min assistance    Status  On-going      OT LONG TERM GOAL #2   Title  Patient will transfer into and out of shower with supervision    Status  On-going      OT LONG TERM GOAL #3   Title  Patient will feed himself, and cut up his own food with set up assistance    Status  On-going      OT LONG TERM GOAL #4   Title  Patient will demonstrate at keast 5 degrees additional PIP flexion in digits 2-5 of right hand to aide in grasp and pinch use.      Status  On-going      OT LONG TERM GOAL #5   Title  Patient will demonstrate active supination at least 10 degrees beyond neutral to aide in orienting hand to object needed for functional task    Status  On-going      OT LONG TERM GOAL #6   Title  Patient will demonstrate active low reach in right arm to obtain lightweight (1 lb or less)  item from countertop or tabletop if standing    Status  On-going      OT LONG TERM GOAL #7   Title  Patient will demonstrate at leaast 90 degrees of passive shoulder flexion or abduction with pain no greater than 2/10 to aide with hygiene and dressing tasks    Status  On-going            Plan - 11/17/17 1417    Clinical Impression Statement  Patient continues to show steady progress due to improved activity tolerance, functional mobility and RUE movement.      Occupational Profile and client history currently impacting functional performance  Husband, father - son with significant psychiatric dx- currently stable on medication, retired Quarry manager, Financial trader, Geophysicist/field seismologist, and Insurance claims handler performance deficits (Please refer to evaluation for details):  ADL's;IADL's;Rest and Sleep;Leisure;Social Participation    Rehab Potential  Fair    Current Impairments/barriers affecting progress:  concern for RSD right UE     OT Frequency  2x / week    OT Duration  12 weeks    Plan  HEP for proximal ROM with ball in supine, Check list for right hand function, NMR/manual  therapy for  R  shoulder, address self feeding, add to HEP    Clinical Decision Making  Multiple treatment options, significant modification of task necessary    OT Home Exercise Plan  Need to determine what has already been established    Consulted and Agree with Plan of Care  Patient;Family member/caregiver    Family Member Consulted  wife - [name]       Patient will benefit from skilled therapeutic intervention in order to improve the following deficits and impairments:  Decreased cognition, Decreased knowledge of use of DME, Decreased skin integrity, Increased edema, Impaired flexibility, Impaired vision/preception, Pain, Abnormal gait, Cardiopulmonary status limiting activity, Decreased coordination, Decreased mobility, Increased fascial restrictions, Impaired sensation, Improper body mechanics, Decreased activity tolerance, Decreased endurance, Decreased range of motion, Decreased strength, Impaired tone, Improper spinal/pelvic alignment, Decreased balance, Decreased knowledge of precautions, Decreased safety awareness, Difficulty walking, Impaired perceived functional ability, Impaired UE functional use  Visit Diagnosis: Stiffness of right shoulder, not elsewhere classified  Stiffness of right wrist, not elsewhere classified  Stiffness of right hand, not elsewhere classified  Visuospatial deficit  Localized edema  Cognitive social or emotional deficit following cerebral infarction  Hemiplegia and hemiparesis following cerebral infarction affecting right dominant side (HCC)  Unsteadiness on feet  Muscle weakness (generalized)  Abnormal posture    Problem List Patient Active Problem List   Diagnosis Date Noted  . Weight loss, unintentional 11/02/2017  . Candidiasis of scrotum 11/02/2017  . Diastolic dysfunction   . Dysphagia, post-stroke   . CKD (chronic kidney disease), stage III ([place]) 06/03/2017  . 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 Stroke (cerebrum) ([place])  06/03/2017  . CVA (cerebral vascular accident) ([place]) 06/01/2017  . TIA (transient ischemic attack) 05/31/2017  . Fatigue 05/08/2017  . Aortic atherosclerosis ([place]) 05/08/2017  . Glaucoma 05/06/2017  . Lipoma of forehead 11/02/2016  . Type II diabetes mellitus with renal manifestations ([place]) 11/02/2016  . Hyperlipidemia LDL goal <100 11/02/2016  . Constipation in male 11/02/2016  . Insomnia due to anxiety and fear 11/02/2016  . Colon cancer screening 05/04/2015  . Vitamin D deficiency 05/02/2015  . Medicare annual wellness visit, subsequent 06/23/2013  . Essential hypertension, benign 03/22/2013  . Osteoarthritis 03/22/2013  . Numbness and tingling in left hand 03/22/2013  . GERD (gastroesophageal reflux disease) 03/22/2013  . BPH (benign prostatic hyperplasia) 03/22/2013    Mariah Milling, OTR/L 11/17/2017, 2:19 PM  [facility] [address] [place], [place], [address] Phone: [phone]   Fax:  [phone]  Name: Anthony Allen MRN: [id number] Date of Birth: 1936/02/23

## 2017-11-17 NOTE — Patient Instructions (Signed)
Right arm exercises.  Start out lying on your back in the middle of the bigger bed. Place your right arm at about a 30 degree angle from your body.   Roll toward your right arm.    In this position, you can see your arm.   Work here to bend and straighten your elbow.  Focus on smooth movement. Also work on turning your palm upward toward the ceiling, and downward.   Finally work on moving your wrist up and down.

## 2017-11-17 NOTE — Therapy (Signed)
[facility] [address] [place], [place], [address] Phone: [phone]   Fax:  [phone]  Physical Therapy Treatment  Patient Details  Name: Anthony Allen MRN: [id number] Date of Birth: December 03, 1935 Referring Provider: Venancio Poisson   Encounter Date: 11/17/2017  PT End of Session - 11/17/17 1612    Visit Number  7    Number of Visits  17    Date for PT Re-Evaluation  12/23/17    Authorization Type  [employer] Medicare    PT Start Time  1407 PT late due to medical emergency in lobby    PT Stop Time  1445    PT Time Calculation (min)  38 min    Equipment Utilized During Treatment  Gait belt    Activity Tolerance  Patient tolerated treatment well    Behavior During Therapy  [facility] for tasks assessed/performed       Past Medical History:  Diagnosis Date  . Arthritis    "mild in my hands" (06/04/2017)  . Complication of anesthesia    "was told never to use Propofol because of parent's adverse reaction" (06/04/2017)  . CVA (cerebral vascular accident) ([place])    hospitalized from 12/22-12/24 due to left-sided weakness, slurred speech and difficulty swallowing. He was diagnosed as left pontine stroke./notes 06/03/2017  . Family history of adverse reaction to anesthesia    "mother and dad had allergic reaction Propofol" (06/04/2017)  . Glaucoma, both eyes   . Gout   . Hypertension   . Parotid mass   . Type 2 diabetes mellitus with hyperglycemia [facility])     Past Surgical History:  Procedure Laterality Date  . TONSILLECTOMY  1943    There were no vitals filed for this visit.  Subjective Assessment - 11/17/17 1412    Subjective  Pt reports being able to get in wife's bed to rest for 1 hour.  Used step stool to get into bed with no issues.     Pertinent History  glaucoma    Limitations  House hold activities;Walking;Standing    Patient Stated Goals  "walk better."     Currently in Pain?  No/denies                        [facility] Adult PT Treatment/Exercise - 11/17/17 0001      Ambulation/Gait   Ambulation/Gait  Yes    Ambulation/Gait Assistance  4: Min guard;4: Min assist    Ambulation/Gait Assistance Details  Utilized Bioness for R lower leg for DF assist during all gait today.  Began with higher intensity and lowered throughout session to better assess carryover and allow more of a sensory input from Bioness rather than muscle contraction.   Was able to get great contraction during session, however still note some lack of control in ankle motion when intensity turned down.  Provided cues for reaching with heels then to "hit with heel"  to avoid overly large R step.  Cues for sequencing with quad tip cane to move towards more step through fluid gait pattern.  Pt doing very well overall.      Ambulation Distance (Feet)  115 Feet 230', 115'    Assistive device  -- quad tip cane    Gait Pattern  Step-through pattern;Decreased arm swing - right;Decreased arm swing - left;Decreased stride length;Decreased dorsiflexion - right;Decreased weight shift to right;Right foot flat;Trunk flexed;Narrow base of support    Ambulation Surface  Level;Indoor    Stairs  Yes    Stairs Assistance  5: Supervision;4: Min guard    Stairs Assistance Details (indicate cue type and reason)  Performed stairs with use of Bioness during session to address ability to perform reciprical pattern with single UE support and carryover good foot clearance.  Note he does well when ascending with RLE, but does have very intermittent right toe catch but is able to correct following several reps.      Stair Management Technique  One rail Left;Alternating pattern;Forwards    Number of Stairs  4 x 4 reps    Height of Stairs  6      Neuro Re-ed    Neuro Re-ed Details   NMR with use of Bioness during session; standing with support of cane on L, advancing RLE over orange barrier and back (stepping over with stim on and back  with stim off) with cues for landing on heel and shifting forward onto RLE following step.  Pt did very well but did note increased fatigue and increased effort required to clear RLE (with hip and knee flex as well as DF).  also performed step ups with LUE on rail with RLE on first 6" step powering up with LLE off ground (tapping to step lightly) and then returning to ground when stim off.  Performed x 10 reps (half with LUE support and half without with PT providing assist and facilitation for improved forward movement onto RLE when elevating.  Cues for increased forward hip protraction.  During rest from [place] with RLE only bridging x 10 reps with PT holding LLE to avoid use of LLE.               PT Education - 11/17/17 1612    Education provided  Yes    Education Details  benefits and use of Bioness    Person(s) Educated  Patient;Spouse    Methods  Explanation    Comprehension  Verbalized understanding       PT Short Term Goals - 10/24/17 1250      PT SHORT TERM GOAL #1   Title  Pt/wife will initiate HEP in order to indicate improved functional mobility (Target date: 11/23/17)    Time  4    Period  Weeks    Status  New    Target Date  11/23/17      PT SHORT TERM GOAL #2   Title  Pt will improve TUG to </=33 secs w/ LRAD at S level in order to indicate decreased fall risk.      Time  4    Period  Weeks    Status  New      PT SHORT TERM GOAL #3   Title  Will assess BERG and improve score by 3 points from baseline in order to indicate decreased fall risk.     Time  4    Period  Weeks    Status  New      PT SHORT TERM GOAL #4   Title  Pt will improve 5TSS to </= 21 secs with single UE support only in order to indicate decreased fall risk and improved functional strength.      Time  4    Period  Weeks    Status  New      PT SHORT TERM GOAL #5   Title  Pt will improve gait speed to 1.94 ft/sec w/ LRAD at S level in order to indicate decreased fall risk.  Time  4     Period  Weeks    Status  New      Additional Short Term Goals   Additional Short Term Goals  Yes      PT SHORT TERM GOAL #6   Title  Pt will ambulate short household distances (up to 50') w/ quad cane at S level in order to indicate improved independence at home.     Time  4    Period  Weeks    Status  New        PT Long Term Goals - 10/24/17 1256      PT LONG TERM GOAL #1   Title  Pt/wife will be independent with final HEP in order to indicate improved functional mobility and decreased fall risk. (Target Date: 12/23/17)    Time  8    Period  Weeks    Status  New    Target Date  12/23/17      PT LONG TERM GOAL #2   Title  Pt will improve BERG balance score by 6 points from baseline in order to indicate decreased fall risk.     Time  8    Period  Weeks    Status  New      PT LONG TERM GOAL #3   Title  Pt will improve TUG time to </=30 secs w/ LRAD at mod I level in order to indicate decreased fall risk.      Time  8    Period  Weeks    Status  New      PT LONG TERM GOAL #4   Title  Pt will improve 5TSS to </=19 secs without UE support in order to indicate improved functional strength and decreased fall risk.      Time  8    Period  Weeks    Status  New      PT LONG TERM GOAL #5   Title  Pt will ambulate x 500' w/ LRAD over indoor surfaces at mod I level in order to indicate improved functional mobility.      Time  8    Period  Weeks    Status  New      Additional Long Term Goals   Additional Long Term Goals  Yes      PT LONG TERM GOAL #6   Title  Pt will ambulate x 300' over unlevel paved outdoor surfaces w/ LRAD at S level in order to indicate improved community negotiation.      Time  8    Period  Weeks    Status  New            Plan - 11/17/17 1614    Clinical Impression Statement  Skilled session focused on initiating use of Bioness on R lower leg for DF assist during gait and NMR tasks.  Pt tolerated very well and will continue to use in future  sessions to address R inattention to R foot during mobility.      Rehab Potential  Good    Clinical Impairments Affecting Rehab Potential  Pt very motivated     PT Frequency  2x / week    PT Duration  8 weeks    PT Treatment/Interventions  ADLs/Self Care Home Management;Electrical Stimulation;DME Instruction;Gait training;Stair training;Functional mobility training;Therapeutic activities;Therapeutic exercise;Balance training;Neuromuscular re-education;Patient/family education;Orthotic Fit/Training;Passive range of motion;Vestibular    PT Next Visit Plan  continue with Bioness for R lower leg, did they begin to  practice gait at home with quad tip cane? Continue gait with quad tip cane, foot up brace (his if they got), stairs with cane, gait with obstacles, balance, gait with LRAD (has used quad cane at home with HHPT but Merrilee Jansky 41/58 & recommend cont RW at home).  watch to see if we need to resume AFO    Consulted and Agree with Plan of Care  Patient;Family member/caregiver    Family Member Consulted  wife [place] (goes by [employer])       Patient will benefit from skilled therapeutic intervention in order to improve the following deficits and impairments:  Abnormal gait, Decreased activity tolerance, Decreased balance, Decreased coordination, Decreased endurance, Decreased knowledge of use of DME, Decreased mobility, Decreased range of motion, Decreased strength, Impaired perceived functional ability, Impaired flexibility, Improper body mechanics, Postural dysfunction, Impaired UE functional use  Visit Diagnosis: Hemiplegia and hemiparesis following cerebral infarction affecting right dominant side (HCC)  Unsteadiness on feet  Muscle weakness (generalized)  Abnormal posture     Problem List Patient Active Problem List   Diagnosis Date Noted  . Weight loss, unintentional 11/02/2017  . Candidiasis of scrotum 11/02/2017  . Diastolic dysfunction   . Dysphagia, post-stroke   . CKD (chronic  kidney disease), stage III ([place]) 06/03/2017  . 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 Stroke (cerebrum) ([place]) 06/03/2017  . CVA (cerebral vascular accident) ([place]) 06/01/2017  . TIA (transient ischemic attack) 05/31/2017  . Fatigue 05/08/2017  . Aortic atherosclerosis ([place]) 05/08/2017  . Glaucoma 05/06/2017  . Lipoma of forehead 11/02/2016  . Type II diabetes mellitus with renal manifestations ([place]) 11/02/2016  . Hyperlipidemia LDL goal <100 11/02/2016  . Constipation in male 11/02/2016  . Insomnia due to anxiety and fear 11/02/2016  . Colon cancer screening 05/04/2015  . Vitamin D deficiency 05/02/2015  . Medicare annual wellness visit, subsequent 06/23/2013  . Essential hypertension, benign 03/22/2013  . Osteoarthritis 03/22/2013  . Numbness and tingling in left hand 03/22/2013  . GERD (gastroesophageal reflux disease) 03/22/2013  . BPH (benign prostatic hyperplasia) 03/22/2013    Cameron Sprang, PT, MPT [facility] [address] [place], [place], [address] Phone: [phone]   Fax:  [phone] 11/17/17, 4:21 PM  Name: MARCIA LEPERA MRN: [id number] Date of Birth: 06/23/1935

## 2017-11-19 ENCOUNTER — Ambulatory Visit: Payer: Medicare Other | Admitting: Psychology

## 2017-11-19 DIAGNOSIS — F4322 Adjustment disorder with anxiety: Secondary | ICD-10-CM

## 2017-11-20 ENCOUNTER — Ambulatory Visit: Payer: Medicare Other | Admitting: Rehabilitation

## 2017-11-20 ENCOUNTER — Encounter: Payer: Self-pay | Admitting: Rehabilitation

## 2017-11-20 ENCOUNTER — Ambulatory Visit: Payer: Medicare Other | Admitting: Occupational Therapy

## 2017-11-20 DIAGNOSIS — I69351 Hemiplegia and hemiparesis following cerebral infarction affecting right dominant side: Secondary | ICD-10-CM | POA: Diagnosis not present

## 2017-11-20 DIAGNOSIS — M25641 Stiffness of right hand, not elsewhere classified: Secondary | ICD-10-CM

## 2017-11-20 DIAGNOSIS — M6281 Muscle weakness (generalized): Secondary | ICD-10-CM

## 2017-11-20 DIAGNOSIS — R600 Localized edema: Secondary | ICD-10-CM

## 2017-11-20 DIAGNOSIS — M25611 Stiffness of right shoulder, not elsewhere classified: Secondary | ICD-10-CM

## 2017-11-20 DIAGNOSIS — R2681 Unsteadiness on feet: Secondary | ICD-10-CM

## 2017-11-20 DIAGNOSIS — R41842 Visuospatial deficit: Secondary | ICD-10-CM

## 2017-11-20 DIAGNOSIS — I69315 Cognitive social or emotional deficit following cerebral infarction: Secondary | ICD-10-CM

## 2017-11-20 DIAGNOSIS — R293 Abnormal posture: Secondary | ICD-10-CM

## 2017-11-20 DIAGNOSIS — M25631 Stiffness of right wrist, not elsewhere classified: Secondary | ICD-10-CM

## 2017-11-20 NOTE — Therapy (Signed)
[facility] [address] [place], [place], [address] Phone: [phone]   Fax:  [phone]  Occupational Therapy Treatment  Patient Details  Name: Anthony Allen MRN: [id number] Date of Birth: 08-08-1935 Referring Provider: Venancio Poisson   Encounter Date: 11/20/2017  OT End of Session - 11/20/17 1701    Visit Number  8    Number of Visits  24    Date for OT Re-Evaluation  01/22/18    Authorization Type  [employer] Medicare.  $20 copay each OT/PT    Authorization - Visit Number  8    Authorization - Number of Visits  10    OT Start Time  1400    OT Stop Time  1444    OT Time Calculation (min)  44 min    Activity Tolerance  Patient tolerated treatment well    Behavior During Therapy  [facility] for tasks assessed/performed       Past Medical History:  Diagnosis Date  . Arthritis    "mild in my hands" (06/04/2017)  . Complication of anesthesia    "was told never to use Propofol because of parent's adverse reaction" (06/04/2017)  . CVA (cerebral vascular accident) ([place])    hospitalized from 12/22-12/24 due to left-sided weakness, slurred speech and difficulty swallowing. He was diagnosed as left pontine stroke./notes 06/03/2017  . Family history of adverse reaction to anesthesia    "mother and dad had allergic reaction Propofol" (06/04/2017)  . Glaucoma, both eyes   . Gout   . Hypertension   . Parotid mass   . Type 2 diabetes mellitus with hyperglycemia [facility])     Past Surgical History:  Procedure Laterality Date  . TONSILLECTOMY  1943    There were no vitals filed for this visit.  Subjective Assessment - 11/20/17 1653    Subjective   I got dressed in my bedroom - it took me awhile    Patient is accompained by:  Family member    Pertinent History  Pt with CVA in December of 2018.  Rehab at SNF then [facility].      Currently in Pain?  Yes    Pain Score  3     Pain Location  Shoulder    Pain Orientation  Right     Pain Descriptors / Indicators  Aching    Pain Type  Chronic pain    Pain Onset  More than a month ago    Pain Frequency  Intermittent    Pain Relieving Factors  tylenol - had not taken today                   OT Treatments/Exercises (OP) - 11/20/17 0001      ADLs   LB Dressing  Patient was able to move into the bedroom after his shower to allow him room to get dressed.  Able to dress himself with only set up assistance.      Bathing  Simulated shower transfer to address independence with both shower transfer, and bathing himself.  Patient has an aide that comes to help with bathing one time / week, but only helps him to wash under left arm, and back.   Problem solved with patient and wife ways in which patient could bathe left axilla with right hand, and use of long handled brush to wash his back.  Worked on dynamic standing balance to address needs for pericare.      Functional Mobility  Worked on transition  from sit to stand and stand to sit without use of arms.        Neurological Re-education Exercises   Other Exercises 1  Neuro reeducation to address proximal shoulder movement and control, and interlimb coordination between shoulder and elbow as needed for reach.               OT Education - 11/20/17 1701    Education provided  Yes    Education Details  shower transfers and bathing independence    Person(s) Educated  Patient;Spouse    Methods  Explanation;Demonstration;Tactile cues;Verbal cues;Handout    Comprehension  Verbalized understanding;Returned demonstration;Need further instruction       OT Short Term Goals - 11/20/17 1702      OT SHORT TERM GOAL #1   Title  Patient will tshirt with set up assistance due 11/23/17    Time  4    Period  Weeks    Status  Achieved      OT SHORT TERM GOAL #2   Title  Patient will don socks with min assist    Time  4    Period  Weeks    Status  Achieved      OT SHORT TERM GOAL #3   Title  Patient will don pants  with min assistance    Time  4    Period  Weeks    Status  Achieved      OT SHORT TERM GOAL #4   Title  Patient will complete HEP designed to improve shoulder range of motion with min assist and cueing    Time  4    Period  Weeks    Status  Achieved        OT Long Term Goals - 11/13/17 1727      OT LONG TERM GOAL #1   Title  Patient will dress himself with only occasional min assistance    Status  On-going      OT LONG TERM GOAL #2   Title  Patient will transfer into and out of shower with supervision    Status  On-going      OT LONG TERM GOAL #3   Title  Patient will feed himself, and cut up his own food with set up assistance    Status  On-going      OT LONG TERM GOAL #4   Title  Patient will demonstrate at keast 5 degrees additional PIP flexion in digits 2-5 of right hand to aide in grasp and pinch use.      Status  On-going      OT LONG TERM GOAL #5   Title  Patient will demonstrate active supination at least 10 degrees beyond neutral to aide in orienting hand to object needed for functional task    Status  On-going      OT LONG TERM GOAL #6   Title  Patient will demonstrate active low reach in right arm to obtain lightweight (1 lb or less)  item from countertop or tabletop if standing    Status  On-going      OT LONG TERM GOAL #7   Title  Patient will demonstrate at leaast 90 degrees of passive shoulder flexion or abduction with pain no greater than 2/10 to aide with hygiene and dressing tasks    Status  On-going            Plan - 11/20/17 1701    Clinical Impression Statement  Patient is making  steady functional progress and gaining independnece with ADL's as functional mobility and use of right hand improves.      Occupational Profile and client history currently impacting functional performance  Husband, father - son with significant psychiatric dx- currently stable on medication, retired Quarry manager, Financial trader, Geophysicist/field seismologist, and Surveyor, mining performance deficits (Please refer to evaluation for details):  ADL's;IADL's;Rest and Sleep;Leisure;Social Participation    Rehab Potential  Fair    Current Impairments/barriers affecting progress:  concern for RSD right UE     OT Frequency  2x / week    OT Duration  12 weeks    OT Treatment/Interventions  Self-care/ADL training;Electrical Stimulation;Therapeutic exercise;Visual/perceptual remediation/compensation;Patient/family education;Splinting;Neuromuscular education;Paraffin;Moist Heat;Aquatic Therapy;Traction;Fluidtherapy;Functional Mobility Training;Scar mobilization;Therapeutic activities;Balance training;Energy conservation;Cognitive remediation/compensation;Passive range of motion;Manual Therapy;DME and/or AE instruction;Contrast Bath;Ultrasound;Cryotherapy    Plan  HEP for proximal ROM with ball in supine, Check list for right hand function, NMR/manual therapy for  R shoulder, address self feeding, add to HEP    Clinical Decision Making  Multiple treatment options, significant modification of task necessary    Consulted and Agree with Plan of Care  Patient;Family member/caregiver    Family Member Consulted  wife - [name]       Patient will benefit from skilled therapeutic intervention in order to improve the following deficits and impairments:  Decreased cognition, Decreased knowledge of use of DME, Decreased skin integrity, Increased edema, Impaired flexibility, Impaired vision/preception, Pain, Abnormal gait, Cardiopulmonary status limiting activity, Decreased coordination, Decreased mobility, Increased fascial restrictions, Impaired sensation, Improper body mechanics, Decreased activity tolerance, Decreased endurance, Decreased range of motion, Decreased strength, Impaired tone, Improper spinal/pelvic alignment, Decreased balance, Decreased knowledge of precautions, Decreased safety awareness, Difficulty walking, Impaired perceived functional ability, Impaired UE functional  use  Visit Diagnosis: Stiffness of right shoulder, not elsewhere classified  Stiffness of right wrist, not elsewhere classified  Stiffness of right hand, not elsewhere classified  Visuospatial deficit  Localized edema  Hemiplegia and hemiparesis following cerebral infarction affecting right dominant side (HCC)  Cognitive social or emotional deficit following cerebral infarction  Unsteadiness on feet  Muscle weakness (generalized)  Abnormal posture    Problem List Patient Active Problem List   Diagnosis Date Noted  . Weight loss, unintentional 11/02/2017  . Candidiasis of scrotum 11/02/2017  . Diastolic dysfunction   . Dysphagia, post-stroke   . CKD (chronic kidney disease), stage III ([place]) 06/03/2017  . 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 Stroke (cerebrum) ([place]) 06/03/2017  . CVA (cerebral vascular accident) ([place]) 06/01/2017  . TIA (transient ischemic attack) 05/31/2017  . Fatigue 05/08/2017  . Aortic atherosclerosis ([place]) 05/08/2017  . Glaucoma 05/06/2017  . Lipoma of forehead 11/02/2016  . Type II diabetes mellitus with renal manifestations ([place]) 11/02/2016  . Hyperlipidemia LDL goal <100 11/02/2016  . Constipation in male 11/02/2016  . Insomnia due to anxiety and fear 11/02/2016  . Colon cancer screening 05/04/2015  . Vitamin D deficiency 05/02/2015  . Medicare annual wellness visit, subsequent 06/23/2013  . Essential hypertension, benign 03/22/2013  . Osteoarthritis 03/22/2013  . Numbness and tingling in left hand 03/22/2013  . GERD (gastroesophageal reflux disease) 03/22/2013  . BPH (benign prostatic hyperplasia) 03/22/2013    Mariah Milling, OTR/L 11/20/2017, 5:04 PM  [facility] [address] [place], [place], [address] Phone: [phone]   Fax:  [phone]  Name: Anthony Allen MRN: [id number] Date of Birth: Aug 04, 1935

## 2017-11-20 NOTE — Therapy (Signed)
[facility] [address] [place], [place], [address] Phone: [phone]   Fax:  [phone]  Physical Therapy Treatment  Patient Details  Name: Anthony Allen MRN: [id number] Date of Birth: Jul 12, 1935 Referring Provider: Venancio Poisson   Encounter Date: 11/20/2017  PT End of Session - 11/20/17 2043    Visit Number  8    Number of Visits  17    Date for PT Re-Evaluation  12/23/17    Authorization Type  [employer]HC Medicare    PT Start Time  6712    PT Stop Time  1400    PT Time Calculation (min)  43 min    Equipment Utilized During Treatment  Gait belt    Activity Tolerance  Patient tolerated treatment well    Behavior During Therapy  [facility] for tasks assessed/performed       Past Medical History:  Diagnosis Date  . Arthritis    "mild in my hands" (06/04/2017)  . Complication of anesthesia    "was told never to use Propofol because of parent's adverse reaction" (06/04/2017)  . CVA (cerebral vascular accident) ([place])    hospitalized from 12/22-12/24 due to left-sided weakness, slurred speech and difficulty swallowing. He was diagnosed as left pontine stroke./notes 06/03/2017  . Family history of adverse reaction to anesthesia    "mother and dad had allergic reaction Propofol" (06/04/2017)  . Glaucoma, both eyes   . Gout   . Hypertension   . Parotid mass   . Type 2 diabetes mellitus with hyperglycemia [facility])     Past Surgical History:  Procedure Laterality Date  . TONSILLECTOMY  1943    There were no vitals filed for this visit.  Subjective Assessment - 11/20/17 1320    Subjective  Pt reports no changes, no falls. Some pain in R shoulder/arm.     Patient is accompained by:  Family member    Pertinent History  glaucoma    Limitations  House hold activities;Walking;Standing    Patient Stated Goals  "walk better."     Currently in Pain?  Yes    Pain Score  3     Pain Location  Shoulder    Pain Orientation   Right    Pain Descriptors / Indicators  Aching;Tightness    Pain Type  Chronic pain    Pain Radiating Towards  into mid humeral region    Pain Onset  More than a month ago    Pain Frequency  Intermittent    Aggravating Factors   malpositioning    Pain Relieving Factors  repositioning                       [facility] Adult PT Treatment/Exercise - 11/20/17 1327      Transfers   Five time sit to stand comments   11.81 secs with single UE support      Ambulation/Gait   Gait velocity  1.62 ft/sec when cued to ambulate at normal speed, 2.77 ft/sec when cued to increase gait speed, however pt not as safe at this speed.  Recommend somewhere in the middle.        Standardized Balance Assessment   Standardized Balance Assessment  Timed Up and Go Test;Berg Balance Test      Berg Balance Test   Sit to Stand  Able to stand  independently using hands    Standing Unsupported  Able to stand safely 2 minutes    Sitting with Back Unsupported  but Feet Supported on Floor or Stool  Able to sit safely and securely 2 minutes    Stand to Sit  Sits safely with minimal use of hands    Transfers  Able to transfer safely, minor use of hands    Standing Unsupported with Eyes Closed  Able to stand 10 seconds with supervision    Standing Ubsupported with Feet Together  Able to place feet together independently and stand for 1 minute with supervision    From Standing, Reach Forward with Outstretched Arm  Can reach forward >12 cm safely (5")    From Standing Position, Pick up Object from Floor  Unable to pick up shoe, but reaches 2-5 cm (1-2") from shoe and balances independently    From Standing Position, Turn to Look Behind Over each Shoulder  Looks behind one side only/other side shows less weight shift    Turn 360 Degrees  Needs close supervision or verbal cueing    Standing Unsupported, Alternately Place Feet on Step/Stool  Able to complete >2 steps/needs minimal assist    Standing Unsupported, One  Foot in Front  Able to plae foot ahead of the other independently and hold 30 seconds    Standing on One Leg  Tries to lift leg/unable to hold 3 seconds but remains standing independently can do on LLE, not on RLE    Total Score  39      Timed Up and Go Test   TUG  Normal TUG    Normal TUG (seconds)  21.94 with RW             PT Education - 11/20/17 2043    Education provided  Yes    Education Details  progress based on STGs.        PT Short Term Goals - 11/20/17 1321      PT SHORT TERM GOAL #1   Title  Pt/wife will initiate HEP in order to indicate improved functional mobility (Target date: 11/23/17)    Time  4    Period  Weeks    Status  New      PT SHORT TERM GOAL #2   Title  Pt will improve TUG to </=33 secs w/ LRAD at S level in order to indicate decreased fall risk.      Baseline  21.94 secs with RW 11/20/17    Time  4    Period  Weeks    Status  Achieved      PT SHORT TERM GOAL #3   Title  Will assess BERG and improve score by 3 points from baseline in order to indicate decreased fall risk.     Baseline  41 at baseline, 39 on 11/20/17    Time  4    Period  Weeks    Status  Not Met      PT SHORT TERM GOAL #4   Title  Pt will improve 5TSS to </= 21 secs with single UE support only in order to indicate decreased fall risk and improved functional strength.      Baseline  11.81 secs with single UE support 11/20/17    Time  4    Period  Weeks    Status  Achieved      PT SHORT TERM GOAL #5   Title  Pt will improve gait speed to 1.94 ft/sec w/ LRAD at S level in order to indicate decreased fall risk.      Baseline  1.62 ft/sec when cued "  walk your normal pace" and 2.77 ft/sec when cued to speed up slightly.  Feel that he is able to ambulate safely at a speed somewhere  between these two speeds.  11/20/17    Time  4    Period  Weeks    Status  Partially Met      PT SHORT TERM GOAL #6   Title  Pt will ambulate short household distances (up to 50') w/ quad cane at S  level in order to indicate improved independence at home.     Time  4    Period  Weeks    Status  New        PT Long Term Goals - 11/20/17 2048      PT LONG TERM GOAL #1   Title  Pt/wife will be independent with final HEP in order to indicate improved functional mobility and decreased fall risk. (Target Date: 12/23/17)    Time  8    Period  Weeks    Status  New      PT LONG TERM GOAL #2   Title  Pt will improve BERG balance score by 6 points from baseline in order to indicate decreased fall risk.     Time  8    Period  Weeks    Status  New      PT LONG TERM GOAL #3   Title  Pt will improve TUG time to </=19 secs w/ LRAD at mod I level in order to indicate decreased fall risk.      Time  8    Period  Weeks    Status  Revised      PT LONG TERM GOAL #4   Title  Pt will improve 5TSS to </=15 secs without UE support in order to indicate improved functional strength and decreased fall risk.      Time  8    Period  Weeks    Status  Revised      PT LONG TERM GOAL #5   Title  Pt will ambulate x 500' w/ LRAD over indoor surfaces at mod I level in order to indicate improved functional mobility.      Time  8    Period  Weeks    Status  New      PT LONG TERM GOAL #6   Title  Pt will ambulate x 300' over unlevel paved outdoor surfaces w/ LRAD at S level in order to indicate improved community negotiation.      Time  8    Period  Weeks    Status  New            Plan - 11/20/17 2044    Clinical Impression Statement  Skilled session focused on assessment of STGs.  Pt has met 2/6 STGs, partially meeting gait speed goal, however demo'd slight decline in BERG balance score.  Overall, pt making excellent progress towards remaining goals, even meeting 2 LTGs (these were updated to reflect progress).  Will assess remaining goals at next session.     Rehab Potential  Good    Clinical Impairments Affecting Rehab Potential  Pt very motivated     PT Frequency  2x / week    PT Duration   8 weeks    PT Treatment/Interventions  ADLs/Self Care Home Management;Electrical Stimulation;DME Instruction;Gait training;Stair training;Functional mobility training;Therapeutic activities;Therapeutic exercise;Balance training;Neuromuscular re-education;Patient/family education;Orthotic Fit/Training;Passive range of motion;Vestibular    PT Next Visit Plan  check remaining goals (we have  been using quad tip cane no quad cane), check HEP, continue with [place] for R lower leg, did they begin to practice gait at home with quad tip cane? Continue gait with quad tip cane, foot up brace (his if they got), stairs with cane, gait with obstacles, balance, gait with LRAD (has used quad cane at home with HHPT but Merrilee Jansky 41/56 & recommend cont RW at home).  watch to see if we need to resume AFO    Consulted and Agree with Plan of Care  Patient;Family member/caregiver    Family Member Consulted  wife Baldwin [place] (goes by [employer])       Patient will benefit from skilled therapeutic intervention in order to improve the following deficits and impairments:  Abnormal gait, Decreased activity tolerance, Decreased balance, Decreased coordination, Decreased endurance, Decreased knowledge of use of DME, Decreased mobility, Decreased range of motion, Decreased strength, Impaired perceived functional ability, Impaired flexibility, Improper body mechanics, Postural dysfunction, Impaired UE functional use  Visit Diagnosis: Hemiplegia and hemiparesis following cerebral infarction affecting right dominant side (HCC)  Unsteadiness on feet  Muscle weakness (generalized)  Abnormal posture     Problem List Patient Active Problem List   Diagnosis Date Noted  . Weight loss, unintentional 11/02/2017  . Candidiasis of scrotum 11/02/2017  . Diastolic dysfunction   . Dysphagia, post-stroke   . CKD (chronic kidney disease), stage III ([place]) 06/03/2017  . 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 Stroke (cerebrum) ([place]) 06/03/2017  .  CVA (cerebral vascular accident) ([place]) 06/01/2017  . TIA (transient ischemic attack) 05/31/2017  . Fatigue 05/08/2017  . Aortic atherosclerosis ([place]) 05/08/2017  . Glaucoma 05/06/2017  . Lipoma of forehead 11/02/2016  . Type II diabetes mellitus with renal manifestations ([place]) 11/02/2016  . Hyperlipidemia LDL goal <100 11/02/2016  . Constipation in male 11/02/2016  . Insomnia due to anxiety and fear 11/02/2016  . Colon cancer screening 05/04/2015  . Vitamin D deficiency 05/02/2015  . Medicare annual wellness visit, subsequent 06/23/2013  . Essential hypertension, benign 03/22/2013  . Osteoarthritis 03/22/2013  . Numbness and tingling in left hand 03/22/2013  . GERD (gastroesophageal reflux disease) 03/22/2013  . BPH (benign prostatic hyperplasia) 03/22/2013    Cameron Sprang, PT, MPT [facility] [address] [place], [place], [address] Phone: [phone]   Fax:  [phone] 11/20/17, 8:54 PM  Name: CESAREO VICKREY MRN: [id number] Date of Birth: 1936/05/03

## 2017-11-24 ENCOUNTER — Encounter: Payer: Self-pay | Admitting: Rehabilitation

## 2017-11-24 ENCOUNTER — Encounter: Payer: Self-pay | Admitting: Occupational Therapy

## 2017-11-24 ENCOUNTER — Ambulatory Visit: Payer: Medicare Other | Admitting: Occupational Therapy

## 2017-11-24 ENCOUNTER — Ambulatory Visit: Payer: Medicare Other | Admitting: Rehabilitation

## 2017-11-24 DIAGNOSIS — M25631 Stiffness of right wrist, not elsewhere classified: Secondary | ICD-10-CM

## 2017-11-24 DIAGNOSIS — R41842 Visuospatial deficit: Secondary | ICD-10-CM

## 2017-11-24 DIAGNOSIS — I69351 Hemiplegia and hemiparesis following cerebral infarction affecting right dominant side: Secondary | ICD-10-CM

## 2017-11-24 DIAGNOSIS — M6281 Muscle weakness (generalized): Secondary | ICD-10-CM

## 2017-11-24 DIAGNOSIS — R293 Abnormal posture: Secondary | ICD-10-CM

## 2017-11-24 DIAGNOSIS — M25641 Stiffness of right hand, not elsewhere classified: Secondary | ICD-10-CM

## 2017-11-24 DIAGNOSIS — R2689 Other abnormalities of gait and mobility: Secondary | ICD-10-CM

## 2017-11-24 DIAGNOSIS — R2681 Unsteadiness on feet: Secondary | ICD-10-CM

## 2017-11-24 DIAGNOSIS — I69315 Cognitive social or emotional deficit following cerebral infarction: Secondary | ICD-10-CM

## 2017-11-24 DIAGNOSIS — M25611 Stiffness of right shoulder, not elsewhere classified: Secondary | ICD-10-CM

## 2017-11-24 NOTE — Therapy (Signed)
[facility] [address] [place], [place], [address] Phone: [phone]   Fax:  [phone]  Physical Therapy Treatment  Patient Details  Name: Anthony Allen MRN: [id number] Date of Birth: 1936-01-07 Referring Provider: Venancio Poisson   Encounter Date: 11/24/2017  PT End of Session - 11/24/17 1954    Visit Number  9    Number of Visits  17    Date for PT Re-Evaluation  12/23/17    Authorization Type  UHC Medicare    PT Start Time  1402    PT Stop Time  1445    PT Time Calculation (min)  43 min    Equipment Utilized During Treatment  Gait belt    Activity Tolerance  Patient tolerated treatment well    Behavior During Therapy  [facility] for tasks assessed/performed       Past Medical History:  Diagnosis Date  . Arthritis    "mild in my hands" (06/04/2017)  . Complication of anesthesia    "was told never to use Propofol because of parent's adverse reaction" (06/04/2017)  . CVA (cerebral vascular accident) ([place])    hospitalized from 12/22-12/24 due to left-sided weakness, slurred speech and difficulty swallowing. He was diagnosed as left pontine stroke./notes 06/03/2017  . Family history of adverse reaction to anesthesia    "mother and dad had allergic reaction Propofol" (06/04/2017)  . Glaucoma, both eyes   . Gout   . Hypertension   . Parotid mass   . Type 2 diabetes mellitus with hyperglycemia [facility])     Past Surgical History:  Procedure Laterality Date  . TONSILLECTOMY  1943    There were no vitals filed for this visit.  Subjective Assessment - 11/24/17 1407    Subjective  Reports he went to a restuarant over the weekend with RW Eula Fried).     Patient is accompained by:  Family member    Pertinent History  glaucoma    Limitations  House hold activities;Walking;Standing    Patient Stated Goals  "walk better."     Currently in Pain?  Yes    Pain Score  4     Pain Location  Shoulder    Pain  Orientation  Right    Pain Descriptors / Indicators  Aching    Pain Type  Chronic pain    Pain Onset  More than a month ago    Pain Frequency  Intermittent    Aggravating Factors   malpositioning    Pain Relieving Factors  tylenol -did take today                        [facility] Adult PT Treatment/Exercise - 11/24/17 1426      Ambulation/Gait   Ambulation/Gait  Yes    Ambulation/Gait Assistance  4: Min guard    Ambulation/Gait Assistance Details  PT wanting to assess gait following NMR tasks.  Also wanted to assess gait with [facility] as he had originally stated HHPT had been working on this at home and therefore STG written for this device.  He requires same amount of assist and actually fear that cane may trip him up, therefore recommended we continue with use of quad tip cane.  Ambulated another lap with quad tip cane with continued cues for landing on R heel, decreasing R step length but increased L step length.  Intermittent tactile cues for upright posture.      Ambulation Distance (Feet)  115 Feet x  2 reps    Assistive device  Small based quad cane quad tip cane    Gait Pattern  Step-through pattern;Decreased arm swing - right;Decreased arm swing - left;Decreased stride length;Decreased dorsiflexion - right;Decreased weight shift to right;Right foot flat;Trunk flexed;Narrow base of support    Ambulation Surface  Level;Indoor      Neuro Re-ed    Neuro Re-ed Details   NMR tasks to increase R lateral weight shift, R LE WB and forced and proximal muscle activation.  Also briefly addressed LUE activation in quadruped to carryover to improved trunk control during gait. Tall kneeling squats x 10 reps with light UE support (on physioball) and cues for increased R lateral weight shift, tall kneeling alternating UE support x 10 reps each side progressing to no UE support x 5 reps with 5 sec hold with improved R lateral weight shift.  L half kneeling holding balance x 2 reps of 5-7 secs  progressing to moving ball forward for improved stability and R hip flex stretch x 5 reps (all with min A for safety and R weight shift).  Quadruped (with PT providing heavy support on R shoulder (axilla, elbow and wrist) to decrease WB force through UE.  Pt unable to fully WB through RUE and PT having to modify R hand placement on rolled up wash clothes to decrease wrist extension but did tolerate forward/backwards and lateral weight shifts x 5 reps each during session.  Ended NMR with transition from R side sit to tall kneeling however pt requires max A to get back into tall kneeling, therefore decreased range into R side sit to 6" step then slightly lower to PTs leg and back to tall kneel x 5 reps each.  Pt tolerated well but with mild fatigue.                PT Education - 11/24/17 1953    Education provided  Yes    Education Details  purpose of WB tasks    Person(s) Educated  Patient    Methods  Explanation    Comprehension  Verbalized understanding       PT Short Term Goals - 11/24/17 1955      PT SHORT TERM GOAL #1   Title  Pt/wife will initiate HEP in order to indicate improved functional mobility (Target date: 11/23/17)    Time  4    Period  Weeks    Status  New      PT SHORT TERM GOAL #2   Title  Pt will improve TUG to </=33 secs w/ LRAD at S level in order to indicate decreased fall risk.      Baseline  21.94 secs with RW 11/20/17    Time  4    Period  Weeks    Status  Achieved      PT SHORT TERM GOAL #3   Title  Will assess BERG and improve score by 3 points from baseline in order to indicate decreased fall risk.     Baseline  41 at baseline, 39 on 11/20/17    Time  4    Period  Weeks    Status  Not Met      PT SHORT TERM GOAL #4   Title  Pt will improve 5TSS to </= 21 secs with single UE support only in order to indicate decreased fall risk and improved functional strength.      Baseline  11.81 secs with single UE support 11/20/17  Time  4    Period  Weeks     Status  Achieved      PT SHORT TERM GOAL #5   Title  Pt will improve gait speed to 1.94 ft/sec w/ LRAD at S level in order to indicate decreased fall risk.      Baseline  1.62 ft/sec when cued "walk your normal pace" and 2.77 ft/sec when cued to speed up slightly.  Feel that he is able to ambulate safely at a speed somewhere  between these two speeds.  11/20/17    Time  4    Period  Weeks    Status  Partially Met      PT SHORT TERM GOAL #6   Title  Pt will ambulate short household distances (up to 50') w/ quad cane at S level in order to indicate improved independence at home.     Baseline  requires intermittent min/guard (assessed with both SBQC and quad tip cane and recommend he continue with quad tip cane. )    Time  4    Period  Weeks    Status  New        PT Long Term Goals - 11/20/17 2048      PT LONG TERM GOAL #1   Title  Pt/wife will be independent with final HEP in order to indicate improved functional mobility and decreased fall risk. (Target Date: 12/23/17)    Time  8    Period  Weeks    Status  New      PT LONG TERM GOAL #2   Title  Pt will improve BERG balance score by 6 points from baseline in order to indicate decreased fall risk.     Time  8    Period  Weeks    Status  New      PT LONG TERM GOAL #3   Title  Pt will improve TUG time to </=19 secs w/ LRAD at mod I level in order to indicate decreased fall risk.      Time  8    Period  Weeks    Status  Revised      PT LONG TERM GOAL #4   Title  Pt will improve 5TSS to </=15 secs without UE support in order to indicate improved functional strength and decreased fall risk.      Time  8    Period  Weeks    Status  Revised      PT LONG TERM GOAL #5   Title  Pt will ambulate x 500' w/ LRAD over indoor surfaces at mod I level in order to indicate improved functional mobility.      Time  8    Period  Weeks    Status  New      PT LONG TERM GOAL #6   Title  Pt will ambulate x 300' over unlevel paved outdoor  surfaces w/ LRAD at S level in order to indicate improved community negotiation.      Time  8    Period  Weeks    Status  New            Plan - 11/24/17 1954    Clinical Impression Statement  Skilled session focused on NMR in tall kneeling, half kneeling, and briefly in quadruped for increased R LE activation, proximal control, and forced use.  Pt continues to make progress with gait following NMR, however would recommend he continue with quad tip cane as  SBQC seems to cause more imbalance.  Pt verbalized understanding.      Rehab Potential  Good    Clinical Impairments Affecting Rehab Potential  Pt very motivated     PT Frequency  2x / week    PT Duration  8 weeks    PT Treatment/Interventions  ADLs/Self Care Home Management;Electrical Stimulation;DME Instruction;Gait training;Stair training;Functional mobility training;Therapeutic activities;Therapeutic exercise;Balance training;Neuromuscular re-education;Patient/family education;Orthotic Fit/Training;Passive range of motion;Vestibular    PT Next Visit Plan   check HEP, continue with Bioness (when able) for R lower leg, did they begin to practice gait at home with quad tip cane? Continue gait with quad tip cane, foot up brace (his if they got), stairs with cane, gait with obstacles, balance, gait with LRAD (has used quad cane at home with HHPT but Merrilee Jansky 41/56 & recommend cont RW at home).  watch to see if we need to resume AFO    Consulted and Agree with Plan of Care  Patient;Family member/caregiver    Family Member Consulted  wife Baldwin [place] (goes by [employer])       Patient will benefit from skilled therapeutic intervention in order to improve the following deficits and impairments:  Abnormal gait, Decreased activity tolerance, Decreased balance, Decreased coordination, Decreased endurance, Decreased knowledge of use of DME, Decreased mobility, Decreased range of motion, Decreased strength, Impaired perceived functional ability, Impaired  flexibility, Improper body mechanics, Postural dysfunction, Impaired UE functional use  Visit Diagnosis: Hemiplegia and hemiparesis following cerebral infarction affecting right dominant side (HCC)  Unsteadiness on feet  Muscle weakness (generalized)  Abnormal posture  Other abnormalities of gait and mobility     Problem List Patient Active Problem List   Diagnosis Date Noted  . Weight loss, unintentional 11/02/2017  . Candidiasis of scrotum 11/02/2017  . Diastolic dysfunction   . Dysphagia, post-stroke   . CKD (chronic kidney disease), stage III ([place]) 06/03/2017  . 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 Stroke (cerebrum) ([place]) 06/03/2017  . CVA (cerebral vascular accident) ([place]) 06/01/2017  . TIA (transient ischemic attack) 05/31/2017  . Fatigue 05/08/2017  . Aortic atherosclerosis ([place]) 05/08/2017  . Glaucoma 05/06/2017  . Lipoma of forehead 11/02/2016  . Type II diabetes mellitus with renal manifestations ([place]) 11/02/2016  . Hyperlipidemia LDL goal <100 11/02/2016  . Constipation in male 11/02/2016  . Insomnia due to anxiety and fear 11/02/2016  . Colon cancer screening 05/04/2015  . Vitamin D deficiency 05/02/2015  . Medicare annual wellness visit, subsequent 06/23/2013  . Essential hypertension, benign 03/22/2013  . Osteoarthritis 03/22/2013  . Numbness and tingling in left hand 03/22/2013  . GERD (gastroesophageal reflux disease) 03/22/2013  . BPH (benign prostatic hyperplasia) 03/22/2013    Cameron Sprang, PT, MPT [facility] [address] [place], [place], [address] Phone: [phone]   Fax:  [phone] 11/24/17, 8:01 PM  Name: Anthony Allen MRN: [id number] Date of Birth: [birth year]

## 2017-11-24 NOTE — Therapy (Signed)
[facility] [address] [place], [place], [address] Phone: [phone]   Fax:  [phone]  Occupational Therapy Treatment  Patient Details  Name: Anthony Allen MRN: [id number] Date of Birth: 1936-03-20 Referring Provider: Venancio Poisson   Encounter Date: 11/24/2017  OT End of Session - 11/24/17 1643    Visit Number  9    Number of Visits  24    Date for OT Re-Evaluation  01/22/18    Authorization Type  UHC Medicare.  $20 copay each OT/PT    Authorization - Visit Number  9    Authorization - Number of Visits  10    OT Start Time  2951    OT Stop Time  1527    OT Time Calculation (min)  42 min    Activity Tolerance  Patient tolerated treatment well    Behavior During Therapy  [facility] for tasks assessed/performed       Past Medical History:  Diagnosis Date  . Arthritis    "mild in my hands" (06/04/2017)  . Complication of anesthesia    "was told never to use Propofol because of parent's adverse reaction" (06/04/2017)  . CVA (cerebral vascular accident) ([place])    hospitalized from 12/22-12/24 due to left-sided weakness, slurred speech and difficulty swallowing. He was diagnosed as left pontine stroke./notes 06/03/2017  . Family history of adverse reaction to anesthesia    "mother and dad had allergic reaction Propofol" (06/04/2017)  . Glaucoma, both eyes   . Gout   . Hypertension   . Parotid mass   . Type 2 diabetes mellitus with hyperglycemia [facility])     Past Surgical History:  Procedure Laterality Date  . TONSILLECTOMY  1943    There were no vitals filed for this visit.  Subjective Assessment - 11/24/17 1447    Subjective   I did about 90% of my shower (in the past he did about 25%)    Patient is accompained by:  Family member    Pertinent History  Pt with CVA in December of 2018.  Rehab at SNF then [facility].      Currently in Pain?  Yes    Pain Score  3     Pain Location  Shoulder    Pain Orientation   Right    Pain Descriptors / Indicators  Aching    Pain Type  Chronic pain    Pain Radiating Towards  GH joint    Pain Frequency  Intermittent    Aggravating Factors   gravity    Pain Relieving Factors  tylenol                   OT Treatments/Exercises (OP) - 11/24/17 0001      ADLs   LB Dressing  Patient now has a sock aide which he ordered, but he has not yet used.  Demonstrated use of  sock aid  to patient and wife.  Patient able to don sock with cueing.      Bathing  Problem solved with aptient and wife - patient's abilty to shower in stall where he used to shower.  Patient now with adequate stand tolerance and balance that he could shower standing in small stall.  Patient and wife plan to do a "dry run" to ensure this is safe.         RUE Fluidotherapy   Number Minutes Fluidotherapy  10 Minutes    RUE Fluidotherapy Location  Hand;Wrist  Comments  Wrapped digits into flexion - followed with stretch to increase digit flexion.               OT Education - 11/24/17 1643    Education provided  Yes    Education Details  sock aide, shower stall transfers    Person(s) Educated  Patient;Spouse    Methods  Explanation;Demonstration;Tactile cues;Verbal cues;Handout    Comprehension  Verbalized understanding;Returned demonstration;Need further instruction       OT Short Term Goals - 11/20/17 1702      OT SHORT TERM GOAL #1   Title  Patient will tshirt with set up assistance due 11/23/17    Time  4    Period  Weeks    Status  Achieved      OT SHORT TERM GOAL #2   Title  Patient will don socks with min assist    Time  4    Period  Weeks    Status  Achieved      OT SHORT TERM GOAL #3   Title  Patient will don pants with min assistance    Time  4    Period  Weeks    Status  Achieved      OT SHORT TERM GOAL #4   Title  Patient will complete HEP designed to improve shoulder range of motion with min assist and cueing    Time  4    Period  Weeks    Status   Achieved        OT Long Term Goals - 11/13/17 1727      OT LONG TERM GOAL #1   Title  Patient will dress himself with only occasional min assistance    Status  On-going      OT LONG TERM GOAL #2   Title  Patient will transfer into and out of shower with supervision    Status  On-going      OT LONG TERM GOAL #3   Title  Patient will feed himself, and cut up his own food with set up assistance    Status  On-going      OT LONG TERM GOAL #4   Title  Patient will demonstrate at keast 5 degrees additional PIP flexion in digits 2-5 of right hand to aide in grasp and pinch use.      Status  On-going      OT LONG TERM GOAL #5   Title  Patient will demonstrate active supination at least 10 degrees beyond neutral to aide in orienting hand to object needed for functional task    Status  On-going      OT LONG TERM GOAL #6   Title  Patient will demonstrate active low reach in right arm to obtain lightweight (1 lb or less)  item from countertop or tabletop if standing    Status  On-going      OT LONG TERM GOAL #7   Title  Patient will demonstrate at leaast 90 degrees of passive shoulder flexion or abduction with pain no greater than 2/10 to aide with hygiene and dressing tasks    Status  On-going            Plan - 11/24/17 1643    Clinical Impression Statement  Patient is making steady functional progress and gaining independnece with ADL's as functional mobility and use of right hand improves.      Occupational Profile and client history currently impacting functional performance  Husband, father -  son with significant psychiatric dx- currently stable on medication, retired Quarry manager, Financial trader, Geophysicist/field seismologist, and Insurance claims handler performance deficits (Please refer to evaluation for details):  ADL's;IADL's;Rest and Sleep;Leisure;Social Participation    Rehab Potential  Fair    Current Impairments/barriers affecting progress:  concern for RSD right UE     OT  Frequency  2x / week    OT Duration  12 weeks    OT Treatment/Interventions  Self-care/ADL training;Electrical Stimulation;Therapeutic exercise;Visual/perceptual remediation/compensation;Patient/family education;Splinting;Neuromuscular education;Paraffin;Moist Heat;Aquatic Therapy;Traction;Fluidtherapy;Functional Mobility Training;Scar mobilization;Therapeutic activities;Balance training;Energy conservation;Cognitive remediation/compensation;Passive range of motion;Manual Therapy;DME and/or AE instruction;Contrast Bath;Ultrasound;Cryotherapy    Plan  HEP for proximal ROM with ball in supine, Check list for right hand function, NMR/manual therapy for  R shoulder, address self feeding, add to HEP    Clinical Decision Making  Multiple treatment options, significant modification of task necessary    OT Home Exercise Plan  Need to determine what has already been established    Consulted and Agree with Plan of Care  Patient;Family member/caregiver    Family Member Consulted  wife - [name]       Patient will benefit from skilled therapeutic intervention in order to improve the following deficits and impairments:  Decreased cognition, Decreased knowledge of use of DME, Decreased skin integrity, Increased edema, Impaired flexibility, Impaired vision/preception, Pain, Abnormal gait, Cardiopulmonary status limiting activity, Decreased coordination, Decreased mobility, Increased fascial restrictions, Impaired sensation, Improper body mechanics, Decreased activity tolerance, Decreased endurance, Decreased range of motion, Decreased strength, Impaired tone, Improper spinal/pelvic alignment, Decreased balance, Decreased knowledge of precautions, Decreased safety awareness, Difficulty walking, Impaired perceived functional ability, Impaired UE functional use  Visit Diagnosis: Stiffness of right shoulder, not elsewhere classified  Stiffness of right wrist, not elsewhere classified  Stiffness of right hand, not  elsewhere classified  Visuospatial deficit  Hemiplegia and hemiparesis following cerebral infarction affecting right dominant side (HCC)  Cognitive social or emotional deficit following cerebral infarction  Unsteadiness on feet  Muscle weakness (generalized)  Abnormal posture    Problem List Patient Active Problem List   Diagnosis Date Noted  . Weight loss, unintentional 11/02/2017  . Candidiasis of scrotum 11/02/2017  . Diastolic dysfunction   . Dysphagia, post-stroke   . CKD (chronic kidney disease), stage III ([place]) 06/03/2017  . 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 Stroke (cerebrum) ([place]) 06/03/2017  . CVA (cerebral vascular accident) ([place]) 06/01/2017  . TIA (transient ischemic attack) 05/31/2017  . Fatigue 05/08/2017  . Aortic atherosclerosis ([place]) 05/08/2017  . Glaucoma 05/06/2017  . Lipoma of forehead 11/02/2016  . Type II diabetes mellitus with renal manifestations ([place]) 11/02/2016  . Hyperlipidemia LDL goal <100 11/02/2016  . Constipation in male 11/02/2016  . Insomnia due to anxiety and fear 11/02/2016  . Colon cancer screening 05/04/2015  . Vitamin D deficiency 05/02/2015  . Medicare annual wellness visit, subsequent 06/23/2013  . Essential hypertension, benign 03/22/2013  . Osteoarthritis 03/22/2013  . Numbness and tingling in left hand 03/22/2013  . GERD (gastroesophageal reflux disease) 03/22/2013  . BPH (benign prostatic hyperplasia) 03/22/2013    Mariah Milling, OTR/L 11/24/2017, 4:45 PM  [facility] [address] [place], [place], [address] Phone: [phone]   Fax:  [phone]  Name: Anthony Allen MRN: [id number] Date of Birth: 05-09-1936

## 2017-11-26 ENCOUNTER — Ambulatory Visit: Payer: Medicare Other | Admitting: Psychology

## 2017-11-26 DIAGNOSIS — F4322 Adjustment disorder with anxiety: Secondary | ICD-10-CM

## 2017-11-28 ENCOUNTER — Ambulatory Visit: Payer: Medicare Other | Admitting: Physical Therapy

## 2017-11-28 ENCOUNTER — Encounter: Payer: Self-pay | Admitting: Physical Therapy

## 2017-11-28 ENCOUNTER — Encounter: Payer: Self-pay | Admitting: Occupational Therapy

## 2017-11-28 ENCOUNTER — Ambulatory Visit: Payer: Medicare Other | Admitting: Occupational Therapy

## 2017-11-28 DIAGNOSIS — I69351 Hemiplegia and hemiparesis following cerebral infarction affecting right dominant side: Secondary | ICD-10-CM

## 2017-11-28 DIAGNOSIS — R2681 Unsteadiness on feet: Secondary | ICD-10-CM

## 2017-11-28 DIAGNOSIS — M25641 Stiffness of right hand, not elsewhere classified: Secondary | ICD-10-CM

## 2017-11-28 DIAGNOSIS — M25611 Stiffness of right shoulder, not elsewhere classified: Secondary | ICD-10-CM

## 2017-11-28 DIAGNOSIS — R293 Abnormal posture: Secondary | ICD-10-CM

## 2017-11-28 DIAGNOSIS — M6281 Muscle weakness (generalized): Secondary | ICD-10-CM

## 2017-11-28 DIAGNOSIS — R2689 Other abnormalities of gait and mobility: Secondary | ICD-10-CM

## 2017-11-28 DIAGNOSIS — M25631 Stiffness of right wrist, not elsewhere classified: Secondary | ICD-10-CM

## 2017-11-28 DIAGNOSIS — I69315 Cognitive social or emotional deficit following cerebral infarction: Secondary | ICD-10-CM

## 2017-11-28 DIAGNOSIS — R41842 Visuospatial deficit: Secondary | ICD-10-CM

## 2017-11-28 NOTE — Therapy (Signed)
[facility] [address] [place], [place], [address] Phone: [phone]   Fax:  [phone]  Occupational Therapy Treatment  Patient Details  Name: Anthony Allen MRN: [id number] Date of Birth: September 05, 1935 Referring Provider: Venancio Poisson   Encounter Date: 11/28/2017  OT End of Session - 11/28/17 1630    Visit Number  10    Number of Visits  24    Date for OT Re-Evaluation  01/22/18    Authorization Type  [employer] Medicare.  $20 copay each OT/PT    Authorization - Visit Number  10    Authorization - Number of Visits  20    OT Start Time  1402    OT Stop Time  1445    OT Time Calculation (min)  43 min    Activity Tolerance  Patient tolerated treatment well    Behavior During Therapy  [facility] for tasks assessed/performed       Past Medical History:  Diagnosis Date  . Arthritis    "mild in my hands" (06/04/2017)  . Complication of anesthesia    "was told never to use Propofol because of parent's adverse reaction" (06/04/2017)  . CVA (cerebral vascular accident) ([place])    hospitalized from 12/22-12/24 due to left-sided weakness, slurred speech and difficulty swallowing. He was diagnosed as left pontine stroke./notes 06/03/2017  . Family history of adverse reaction to anesthesia    "mother and dad had allergic reaction Propofol" (06/04/2017)  . Glaucoma, both eyes   . Gout   . Hypertension   . Parotid mass   . Type 2 diabetes mellitus with hyperglycemia [facility])     Past Surgical History:  Procedure Laterality Date  . TONSILLECTOMY  1943    There were no vitals filed for this visit.  Subjective Assessment - 11/28/17 1407    Subjective   I was able to wash under my left arm.      Patient is accompained by:  Family member    Pertinent History  Pt with CVA in December of 2018.  Rehab at SNF then [facility].      Currently in Pain?  No/denies    Pain Score  0-No pain         [facility] OT Assessment - 11/28/17 0001       Right Hand AROM   R Index PIP 0-100  70 Degrees    R Long PIP 0-100  65 Degrees    R Ring PIP 0-100  59 Degrees    R Little PIP 0-100  60 Degrees               OT Treatments/Exercises (OP) - 11/28/17 0001      ADLs   LB Dressing  Patient able to use sock aide at home per his report.      Bathing  Patient now able to use his right hand to wash left axilla in shower.      Writing  Patient able to hold a pen with built up grip.  Patient issued some cylindrical foam for use at home.        Visual/Perceptual Exercises   Letter Search  Patient with limited visual attention to right.  Maintains head neutral - and eyes forward.  When giving scanning - paper nad pencil task -  consistently patient missed target to right of midline.  With overt cueing, patient able to recognize and correct errors.  In environmental scanning (non distracting env't) patient able to locate targets  on right 100%      Neurological Re-education Exercises   Other Exercises 1  Standing stretch to right shoulder in closed chain - bend at hips - hand on counter.  Patient able to determine stretch versus pain.      Other Exercises 2  Issued theraputty exercises (yellow) for grasp and pinch             OT Education - 11/28/17 1629    Education provided  Yes    Education Details  therapy putty - yellow for grasp and pinch; scanning to right, built ip grip    Person(s) Educated  Patient;Spouse    Methods  Explanation;Demonstration;Tactile cues;Verbal cues;Handout    Comprehension  Verbalized understanding;Returned demonstration;Need further instruction       OT Short Term Goals - 11/20/17 1702      OT SHORT TERM GOAL #1   Title  Patient will tshirt with set up assistance due 11/23/17    Time  4    Period  Weeks    Status  Achieved      OT SHORT TERM GOAL #2   Title  Patient will don socks with min assist    Time  4    Period  Weeks    Status  Achieved      OT SHORT TERM GOAL #3   Title   Patient will don pants with min assistance    Time  4    Period  Weeks    Status  Achieved      OT SHORT TERM GOAL #4   Title  Patient will complete HEP designed to improve shoulder range of motion with min assist and cueing    Time  4    Period  Weeks    Status  Achieved        OT Long Term Goals - 11/28/17 1636      OT LONG TERM GOAL #1   Title  Patient will dress himself with only occasional min assistance    Status  Achieved      OT LONG TERM GOAL #2   Title  Patient will transfer into and out of shower with supervision    Status  Achieved      OT LONG TERM GOAL #3   Title  Patient will feed himself, and cut up his own food with set up assistance    Status  On-going      OT LONG TERM GOAL #4   Title  Patient will demonstrate at keast 5 degrees additional PIP flexion in digits 2-5 of right hand to aide in grasp and pinch use.      Status  Achieved      OT LONG TERM GOAL #5   Title  Patient will demonstrate active supination at least 10 degrees beyond neutral to aide in orienting hand to object needed for functional task    Status  On-going      OT LONG TERM GOAL #6   Title  Patient will demonstrate active low reach in right arm to obtain lightweight (1 lb or less)  item from countertop or tabletop if standing    Status  On-going      OT LONG TERM GOAL #7   Title  Patient will demonstrate at leaast 90 degrees of passive shoulder flexion or abduction with pain no greater than 2/10 to aide with hygiene and dressing tasks    Status  On-going  Plan - 11/28/17 1630    Clinical Impression Statement  Patient continues to show improvement with functional mobility, and use of right UE.  Patient has met all short term goals, and is now working toward long term goals    Occupational Profile and client history currently impacting functional performance  Husband, father - son with significant psychiatric dx- currently stable on medication, retired Administrator, Financial trader, Geophysicist/field seismologist, and Insurance claims handler performance deficits (Please refer to evaluation for details):  ADL's;IADL's;Rest and Sleep;Leisure;Social Participation    Rehab Potential  Fair    Current Impairments/barriers affecting progress:  concern for RSD right UE     OT Frequency  2x / week    OT Duration  12 weeks    OT Treatment/Interventions  Self-care/ADL training;Electrical Stimulation;Therapeutic exercise;Visual/perceptual remediation/compensation;Patient/family education;Splinting;Neuromuscular education;Paraffin;Moist Heat;Aquatic Therapy;Traction;Fluidtherapy;Functional Mobility Training;Scar mobilization;Therapeutic activities;Balance training;Energy conservation;Cognitive remediation/compensation;Passive range of motion;Manual Therapy;DME and/or AE instruction;Contrast Bath;Ultrasound;Cryotherapy    Plan  Start Home activities program to address right hand function - shoulder NMR    Clinical Decision Making  Multiple treatment options, significant modification of task necessary    Consulted and Agree with Plan of Care  Patient;Family member/caregiver    Family Member Consulted  wife - [name]       Patient will benefit from skilled therapeutic intervention in order to improve the following deficits and impairments:  Decreased cognition, Decreased knowledge of use of DME, Decreased skin integrity, Increased edema, Impaired flexibility, Impaired vision/preception, Pain, Abnormal gait, Cardiopulmonary status limiting activity, Decreased coordination, Decreased mobility, Increased fascial restrictions, Impaired sensation, Improper body mechanics, Decreased activity tolerance, Decreased endurance, Decreased range of motion, Decreased strength, Impaired tone, Improper spinal/pelvic alignment, Decreased balance, Decreased knowledge of precautions, Decreased safety awareness, Difficulty walking, Impaired perceived functional ability, Impaired UE functional  use  Visit Diagnosis: Stiffness of right shoulder, not elsewhere classified  Stiffness of right wrist, not elsewhere classified  Stiffness of right hand, not elsewhere classified  Visuospatial deficit  Hemiplegia and hemiparesis following cerebral infarction affecting right dominant side (HCC)  Cognitive social or emotional deficit following cerebral infarction  Unsteadiness on feet  Muscle weakness (generalized)  Abnormal posture    Problem List Patient Active Problem List   Diagnosis Date Noted  . Weight loss, unintentional 11/02/2017  . Candidiasis of scrotum 11/02/2017  . Diastolic dysfunction   . Dysphagia, post-stroke   . CKD (chronic kidney disease), stage III ([place]) 06/03/2017  . 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 Stroke (cerebrum) ([place]) 06/03/2017  . CVA (cerebral vascular accident) ([place]) 06/01/2017  . TIA (transient ischemic attack) 05/31/2017  . Fatigue 05/08/2017  . Aortic atherosclerosis ([place]) 05/08/2017  . Glaucoma 05/06/2017  . Lipoma of forehead 11/02/2016  . Type II diabetes mellitus with renal manifestations ([place]) 11/02/2016  . Hyperlipidemia LDL goal <100 11/02/2016  . Constipation in male 11/02/2016  . Insomnia due to anxiety and fear 11/02/2016  . Colon cancer screening 05/04/2015  . Vitamin D deficiency 05/02/2015  . Medicare annual wellness visit, subsequent 06/23/2013  . Essential hypertension, benign 03/22/2013  . Osteoarthritis 03/22/2013  . Numbness and tingling in left hand 03/22/2013  . GERD (gastroesophageal reflux disease) 03/22/2013  . BPH (benign prostatic hyperplasia) 03/22/2013  Occupational Therapy Progress Note  Dates of Reporting Period: 10/24/2017 to 11/28/2017   Reason Skilled Services are Required: Patient has made significant improvement in his ability to bathe and dress himself, and he has shown significant improvement in functional use of his right arm.    Marlowe Sax  M, OTR/L 11/28/2017, 4:38 PM  [facility] [address] [place], [place], [address] Phone: [phone]   Fax:  [phone]  Name: Anthony Allen MRN: [id number] Date of Birth: [birth year]

## 2017-11-28 NOTE — Patient Instructions (Signed)
1. Grip Strengthening (Resistive Putty)   Squeeze putty using thumb and all fingers. Repeat 15 times. Do 1 sessions per day.   Extension (Assistive Putty)   Roll putty back and forth, being sure to use all fingertips. Repeat 3 times. Do 1 sessions per day.  Then pinch as below.   Palmar Pinch Strengthening (Resistive Putty)   Pinch putty between thumb and each fingertip in turn after rolling out    Finger and Thumb Extension (Resistive Putty)   With thumb and all fingers in center of putty donut, stretch out. Repeat 5-10 times. Do 1 sessions per day.

## 2017-11-29 NOTE — Therapy (Signed)
[facility] [address] [place], [place], [address] Phone: [phone]   Fax:  [phone]  Physical Therapy Treatment and 10th visit Progress Note  Patient Details  Name: Anthony Allen MRN: [id number] Date of Birth: [birth year] Referring Provider: Venancio Poisson   Encounter Date: 11/28/2017   Progress Note Reporting Period 10/24/2017 to 11/28/2017  See note below for Objective Data and Assessment of Progress/Goals.    PT End of Session - 11/29/17 1523    Visit Number  10    Number of Visits  17    Date for PT Re-Evaluation  12/23/17    Authorization Type  [employer] Medicare - 10th visit PN     PT Start Time  1457    PT Stop Time  1534    PT Time Calculation (min)  37 min    Equipment Utilized During Treatment  Gait belt    Activity Tolerance  Patient tolerated treatment well    Behavior During Therapy  WFL for tasks assessed/performed       Past Medical History:  Diagnosis Date  . Arthritis    "mild in my hands" (06/04/2017)  . Complication of anesthesia    "was told never to use Propofol because of parent's adverse reaction" (06/04/2017)  . CVA (cerebral vascular accident) ([place])    hospitalized from 12/22-12/24 due to left-sided weakness, slurred speech and difficulty swallowing. He was diagnosed as left pontine stroke./notes 06/03/2017  . Family history of adverse reaction to anesthesia    "mother and dad had allergic reaction Propofol" (06/04/2017)  . Glaucoma, both eyes   . Gout   . Hypertension   . Parotid mass   . Type 2 diabetes mellitus with hyperglycemia [facility])     Past Surgical History:  Procedure Laterality Date  . TONSILLECTOMY  1943    There were no vitals filed for this visit.  Subjective Assessment - 11/28/17 1502    Subjective  Tried the cane; felt "okay".  Has foot up brace from [facility] - it is in his shoes with laces.  Wore loafers today.    Patient is accompained by:  Family  member    Pertinent History  glaucoma    Limitations  House hold activities;Walking;Standing    Patient Stated Goals  "walk better."     Currently in Pain?  Yes    Pain Onset  More than a month ago         [facility] Adult PT Treatment/Exercise - 11/28/17 1518      Ambulation/Gait   Ambulation/Gait  Yes    Ambulation/Gait Assistance  4: Min guard    Ambulation/Gait Assistance Details  gait training with quad tip cane.  Performed forwards walking and R/L side stepping in floor ladder to focus on increased step length, foot clearance and weight shifting    Ambulation Distance (Feet)  115 Feet    Assistive device  Straight cane    Ambulation Surface  Level;Indoor    Stairs  Yes    Stairs Assistance  4: Min assist    Stairs Assistance Details (indicate cue type and reason)  started with cane in L hand, rail in R.  Transitioned to no rail but with light HHA support    Stair Management Technique  No rails;One rail Right;With cane;Step to pattern;Forwards    Number of Stairs  12    Height of Stairs  6    Ramp  4: Min assist    Ramp Details (indicate cue  type and reason)  with quad tip cane      Exercises   Exercises  Knee/Hip      Knee/Hip Exercises: Seated   Sit to Sand  1 set;5 reps L foot forwards, R foot back.  Push through RLE             PT Education - 11/29/17 1522    Education provided  Yes    Education Details  gait with quad tip cane    Person(s) Educated  Patient    Methods  Explanation;Demonstration    Comprehension  Need further instruction       PT Short Term Goals - 11/24/17 1955      PT SHORT TERM GOAL #1   Title  Pt/wife will initiate HEP in order to indicate improved functional mobility (Target date: 11/23/17)    Time  4    Period  Weeks    Status  New      PT SHORT TERM GOAL #2   Title  Pt will improve TUG to </=33 secs w/ LRAD at S level in order to indicate decreased fall risk.      Baseline  21.94 secs with RW 11/20/17    Time  4    Period  Weeks     Status  Achieved      PT SHORT TERM GOAL #3   Title  Will assess BERG and improve score by 3 points from baseline in order to indicate decreased fall risk.     Baseline  41 at baseline, 39 on 11/20/17    Time  4    Period  Weeks    Status  Not Met      PT SHORT TERM GOAL #4   Title  Pt will improve 5TSS to </= 21 secs with single UE support only in order to indicate decreased fall risk and improved functional strength.      Baseline  11.81 secs with single UE support 11/20/17    Time  4    Period  Weeks    Status  Achieved      PT SHORT TERM GOAL #5   Title  Pt will improve gait speed to 1.94 ft/sec w/ LRAD at S level in order to indicate decreased fall risk.      Baseline  1.62 ft/sec when cued "walk your normal pace" and 2.77 ft/sec when cued to speed up slightly.  Feel that he is able to ambulate safely at a speed somewhere  between these two speeds.  11/20/17    Time  4    Period  Weeks    Status  Partially Met      PT SHORT TERM GOAL #6   Title  Pt will ambulate short household distances (up to 50') w/ quad cane at S level in order to indicate improved independence at home.     Baseline  requires intermittent min/guard (assessed with both SBQC and quad tip cane and recommend he continue with quad tip cane. )    Time  4    Period  Weeks    Status  New        PT Long Term Goals - 11/20/17 2048      PT LONG TERM GOAL #1   Title  Pt/wife will be independent with final HEP in order to indicate improved functional mobility and decreased fall risk. (Target Date: 12/23/17)    Time  8    Period  Weeks  Status  New      PT LONG TERM GOAL #2   Title  Pt will improve BERG balance score by 6 points from baseline in order to indicate decreased fall risk.     Time  8    Period  Weeks    Status  New      PT LONG TERM GOAL #3   Title  Pt will improve TUG time to </=19 secs w/ LRAD at mod I level in order to indicate decreased fall risk.      Time  8    Period  Weeks    Status   Revised      PT LONG TERM GOAL #4   Title  Pt will improve 5TSS to </=15 secs without UE support in order to indicate improved functional strength and decreased fall risk.      Time  8    Period  Weeks    Status  Revised      PT LONG TERM GOAL #5   Title  Pt will ambulate x 500' w/ LRAD over indoor surfaces at mod I level in order to indicate improved functional mobility.      Time  8    Period  Weeks    Status  New      PT LONG TERM GOAL #6   Title  Pt will ambulate x 300' over unlevel paved outdoor surfaces w/ LRAD at S level in order to indicate improved community negotiation.      Time  8    Period  Weeks    Status  New            Plan - 11/29/17 1524    Clinical Impression Statement  Treatment session today focused on continued gait training with quad tip cane.  Reviewed safe gait sequence for negotiating ramp to enter/exit pt's residence and performed stair negotiation training initially with cane and one rail progressing to no rail but with light HHA from therapist for stability - pt reports he and his wife attend dinners at friends' homes that often do not have rails.  Gait with use of floor ladder to facilitate increased weight shifting, step/stride length and foot clearance.  Pt is making good progress towards goals and demonstrates improved gait velocity and safety with gait but continues to demonstrate impaired balance and attention to R side and continues to require min A for safe ambulation with cane; therapy recommending continued use of RW when ambulating with supervision only.  Will continue to address impairments to continue to progress towards LTG.    Rehab Potential  Good    Clinical Impairments Affecting Rehab Potential  Pt very motivated     PT Frequency  2x / week    PT Duration  8 weeks    PT Treatment/Interventions  ADLs/Self Care Home Management;Electrical Stimulation;DME Instruction;Gait training;Stair training;Functional mobility training;Therapeutic  activities;Therapeutic exercise;Balance training;Neuromuscular re-education;Patient/family education;Orthotic Fit/Training;Passive range of motion;Vestibular    PT Next Visit Plan   check HEP, continue with Bioness (when able) for R lower leg, stairs with cane and no rails, gait with obstacles, balance, gait with LRAD (has used quad cane at home with HHPT but Merrilee Jansky 41/56 & recommend cont RW at home).  watch to see if we need to resume AFO    Consulted and Agree with Plan of Care  Patient;Family member/caregiver    Family Member Consulted  wife Baldwin [place] (goes by [employer])       Patient will benefit from  skilled therapeutic intervention in order to improve the following deficits and impairments:  Abnormal gait, Decreased activity tolerance, Decreased balance, Decreased coordination, Decreased endurance, Decreased knowledge of use of DME, Decreased mobility, Decreased range of motion, Decreased strength, Impaired perceived functional ability, Impaired flexibility, Improper body mechanics, Postural dysfunction, Impaired UE functional use  Visit Diagnosis: Unsteadiness on feet  Muscle weakness (generalized)  Abnormal posture  Other abnormalities of gait and mobility     Problem List Patient Active Problem List   Diagnosis Date Noted  . Weight loss, unintentional 11/02/2017  . Candidiasis of scrotum 11/02/2017  . Diastolic dysfunction   . Dysphagia, post-stroke   . CKD (chronic kidney disease), stage III ([place]) 06/03/2017  . 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 Stroke (cerebrum) ([place]) 06/03/2017  . CVA (cerebral vascular accident) ([place]) 06/01/2017  . TIA (transient ischemic attack) 05/31/2017  . Fatigue 05/08/2017  . Aortic atherosclerosis ([place]) 05/08/2017  . Glaucoma 05/06/2017  . Lipoma of forehead 11/02/2016  . Type II diabetes mellitus with renal manifestations ([place]) 11/02/2016  . Hyperlipidemia LDL goal <100 11/02/2016  . Constipation in male 11/02/2016  . Insomnia due to anxiety  and fear 11/02/2016  . Colon cancer screening 05/04/2015  . Vitamin D deficiency 05/02/2015  . Medicare annual wellness visit, subsequent 06/23/2013  . Essential hypertension, benign 03/22/2013  . Osteoarthritis 03/22/2013  . Numbness and tingling in left hand 03/22/2013  . GERD (gastroesophageal reflux disease) 03/22/2013  . BPH (benign prostatic hyperplasia) 03/22/2013    Rico Junker, PT, DPT 11/29/17    3:33 PM    [facility] [address] [place], [place], [address] Phone: [phone]   Fax:  [phone]  Name: Anthony Allen MRN: [id number] Date of Birth: May 01, 1936

## 2017-12-01 ENCOUNTER — Encounter: Payer: Self-pay | Admitting: Occupational Therapy

## 2017-12-01 ENCOUNTER — Ambulatory Visit: Payer: Medicare Other | Admitting: Occupational Therapy

## 2017-12-01 ENCOUNTER — Ambulatory Visit: Payer: Medicare Other | Admitting: Rehabilitation

## 2017-12-01 ENCOUNTER — Encounter: Payer: Self-pay | Admitting: Rehabilitation

## 2017-12-01 DIAGNOSIS — R2689 Other abnormalities of gait and mobility: Secondary | ICD-10-CM

## 2017-12-01 DIAGNOSIS — R2681 Unsteadiness on feet: Secondary | ICD-10-CM

## 2017-12-01 DIAGNOSIS — I69351 Hemiplegia and hemiparesis following cerebral infarction affecting right dominant side: Secondary | ICD-10-CM

## 2017-12-01 DIAGNOSIS — R293 Abnormal posture: Secondary | ICD-10-CM

## 2017-12-01 DIAGNOSIS — M25611 Stiffness of right shoulder, not elsewhere classified: Secondary | ICD-10-CM

## 2017-12-01 DIAGNOSIS — I69315 Cognitive social or emotional deficit following cerebral infarction: Secondary | ICD-10-CM

## 2017-12-01 DIAGNOSIS — R41842 Visuospatial deficit: Secondary | ICD-10-CM

## 2017-12-01 DIAGNOSIS — M25631 Stiffness of right wrist, not elsewhere classified: Secondary | ICD-10-CM

## 2017-12-01 DIAGNOSIS — M6281 Muscle weakness (generalized): Secondary | ICD-10-CM

## 2017-12-01 DIAGNOSIS — M25641 Stiffness of right hand, not elsewhere classified: Secondary | ICD-10-CM

## 2017-12-01 NOTE — Progress Notes (Signed)
Looks like he is doing great! Thank you for the update.

## 2017-12-01 NOTE — Therapy (Signed)
[facility] [address] [place], [place], [address] Phone: [phone]   Fax:  [phone]  Physical Therapy Treatment  Patient Details  Name: Anthony Allen MRN: [id number] Date of Birth: [birth year] Referring Provider: Venancio Poisson   Encounter Date: 12/01/2017  PT End of Session - 12/01/17 2014    Visit Number  11    Number of Visits  17    Date for PT Re-Evaluation  12/23/17    Authorization Type  [employer] Medicare - 10th visit PN     PT Start Time  1401    PT Stop Time  1445    PT Time Calculation (min)  44 min    Equipment Utilized During Treatment  Gait belt    Activity Tolerance  Patient tolerated treatment well    Behavior During Therapy  WFL for tasks assessed/performed       Past Medical History:  Diagnosis Date  . Arthritis    "mild in my hands" (06/04/2017)  . Complication of anesthesia    "was told never to use Propofol because of parent's adverse reaction" (06/04/2017)  . CVA (cerebral vascular accident) ([place])    hospitalized from 12/22-12/24 due to left-sided weakness, slurred speech and difficulty swallowing. He was diagnosed as left pontine stroke./notes 06/03/2017  . Family history of adverse reaction to anesthesia    "mother and dad had allergic reaction Propofol" (06/04/2017)  . Glaucoma, both eyes   . Gout   . Hypertension   . Parotid mass   . Type 2 diabetes mellitus with hyperglycemia [facility])     Past Surgical History:  Procedure Laterality Date  . TONSILLECTOMY  1943    There were no vitals filed for this visit.  Subjective Assessment - 12/01/17 1438    Subjective  Pt reports doing well today, had some pain this morning in arm, but took Tylenol.     Patient is accompained by:  Family member    Limitations  House hold activities;Walking;Standing    Patient Stated Goals  "walk better."     Currently in Pain?  No/denies                       [facility] Adult PT  Treatment/Exercise - 12/01/17 1405      Ambulation/Gait   Ambulation/Gait  Yes    Ambulation/Gait Assistance  4: Min guard    Ambulation/Gait Assistance Details  Utilized Bioness on R lower leg for R DF assist and on R quads today for increased R knee extension during stance.  Pt tolerated very well during session and performed all gait with quad tip cane.  Pt continues to require cues for posture and landing with R heel.  He still has very intermittent (2-3 during session) instances when R toe would catch.  No overt LOB, but requires min/guard for safety.  He continues to report that they haven't been walking very much at home with quad tip cane.  PT continues to recommend they do this at home for exercise with use of foot up brace and min/guard from wife to improve quality and endurance.  Pt verbalized understanding.     Ambulation Distance (Feet)  230 Feet another 115'    Assistive device  Straight cane with quad tip     Gait Pattern  Step-through pattern;Decreased arm swing - right;Decreased arm swing - left;Decreased stride length;Decreased dorsiflexion - right;Decreased weight shift to right;Right foot flat;Trunk flexed;Narrow base of support    Ambulation  Surface  Level;Indoor    Stairs  Yes    Stairs Assistance  4: Min guard    Stairs Assistance Details (indicate cue type and reason)  Performed standard stairs with L rail, alternating pattern with Bioness in stim mode with cues for increased R knee extension when leading with RLE.  Continue to recommend he lead with RLE when performing stairs in community to avoid R toe catch due to R inattention.      Stair Management Technique  One rail Left;Alternating pattern;Forwards    Number of Stairs  4    Height of Stairs  6    Ramp  -- min/guard    Ramp Details (indicate cue type and reason)  Cues for larger step length    Curb  4: Min assist    Curb Details (indicate cue type and reason)  Cues for sequencing, step through technique, and leading  with RLE when ascending curb to avoid R toe catch.        Neuro Re-ed    Neuro Re-ed Details   Utilized Bioness as mentioned above for NMR tasks; stepping up 8" step (two steps) with LUE support leading with RLE when Bioness turned on and descending backwards when turned on for controlled descent (descending with LLE) x 3 reps.  RLE placed on 6" step with LUE support elevating onto RLE tapping LLE to top step and down with stim turned on.  Pt also needing tactile cues to increase R knee extension.  Ended session with step downs with LLE on 4" step with light LUE support x 10 reps with stim turned on during step down.  Pt did very well once cued for increased knee activation.               PT Education - 12/01/17 2014    Education provided  Yes    Education Details  Continuing to educate on importance of working on gait at home with quad tip cane with wife.     Person(s) Educated  Patient    Methods  Explanation    Comprehension  Verbalized understanding       PT Short Term Goals - 12/01/17 2019      PT SHORT TERM GOAL #1   Title  Pt/wife will initiate HEP in order to indicate improved functional mobility (Target date: 11/23/17)    Time  4    Period  Weeks    Status  New      PT SHORT TERM GOAL #2   Title  Pt will improve TUG to </=33 secs w/ LRAD at S level in order to indicate decreased fall risk.      Baseline  21.94 secs with RW 11/20/17    Time  4    Period  Weeks    Status  Achieved      PT SHORT TERM GOAL #3   Title  Will assess BERG and improve score by 3 points from baseline in order to indicate decreased fall risk.     Baseline  41 at baseline, 39 on 11/20/17    Time  4    Period  Weeks    Status  Not Met      PT SHORT TERM GOAL #4   Title  Pt will improve 5TSS to </= 21 secs with single UE support only in order to indicate decreased fall risk and improved functional strength.      Baseline  11.81 secs with single UE support 11/20/17  Time  4    Period  Weeks     Status  Achieved      PT SHORT TERM GOAL #5   Title  Pt will improve gait speed to 1.94 ft/sec w/ LRAD at S level in order to indicate decreased fall risk.      Baseline  1.62 ft/sec when cued "walk your normal pace" and 2.77 ft/sec when cued to speed up slightly.  Feel that he is able to ambulate safely at a speed somewhere  between these two speeds.  11/20/17    Time  4    Period  Weeks    Status  Partially Met      PT SHORT TERM GOAL #6   Title  Pt will ambulate short household distances (up to 50') w/ quad cane at S level in order to indicate improved independence at home.     Baseline  requires intermittent min/guard (assessed with both SBQC and quad tip cane and recommend he continue with quad tip cane. )    Time  4    Period  Weeks    Status  Partially Met        PT Long Term Goals - 11/20/17 2048      PT LONG TERM GOAL #1   Title  Pt/wife will be independent with final HEP in order to indicate improved functional mobility and decreased fall risk. (Target Date: 12/23/17)    Time  8    Period  Weeks    Status  New      PT LONG TERM GOAL #2   Title  Pt will improve BERG balance score by 6 points from baseline in order to indicate decreased fall risk.     Time  8    Period  Weeks    Status  New      PT LONG TERM GOAL #3   Title  Pt will improve TUG time to </=19 secs w/ LRAD at mod I level in order to indicate decreased fall risk.      Time  8    Period  Weeks    Status  Revised      PT LONG TERM GOAL #4   Title  Pt will improve 5TSS to </=15 secs without UE support in order to indicate improved functional strength and decreased fall risk.      Time  8    Period  Weeks    Status  Revised      PT LONG TERM GOAL #5   Title  Pt will ambulate x 500' w/ LRAD over indoor surfaces at mod I level in order to indicate improved functional mobility.      Time  8    Period  Weeks    Status  New      PT LONG TERM GOAL #6   Title  Pt will ambulate x 300' over unlevel paved  outdoor surfaces w/ LRAD at S level in order to indicate improved community negotiation.      Time  8    Period  Weeks    Status  New            Plan - 12/01/17 2015    Clinical Impression Statement  Session utilized Bioness for RLE (lower cuff for R DF assist and R quad for knee extension during stance) during gait and NMR tasks (with quad tip cane) in order to carryover to improved gait quality due to R inattention.  Pt making  progress with gait, however he continues to need intermittent cues for R heel strike and cues for continuing to practice gait at home with quad tip cane with wife to increase independence.      Rehab Potential  Good    Clinical Impairments Affecting Rehab Potential  Pt very motivated     PT Frequency  2x / week    PT Duration  8 weeks    PT Treatment/Interventions  ADLs/Self Care Home Management;Electrical Stimulation;DME Instruction;Gait training;Stair training;Functional mobility training;Therapeutic activities;Therapeutic exercise;Balance training;Neuromuscular re-education;Patient/family education;Orthotic Fit/Training;Passive range of motion;Vestibular    PT Next Visit Plan  R LE NMR (hip and knee extension, R DF), go over HEP intermittently, gait with quad tip cane, stairs with cane and no rails (to get into man cave)    Consulted and Agree with Plan of Care  Patient;Family member/caregiver    Family Member Consulted  wife Baldwin [place] (goes by [employer])       Patient will benefit from skilled therapeutic intervention in order to improve the following deficits and impairments:  Abnormal gait, Decreased activity tolerance, Decreased balance, Decreased coordination, Decreased endurance, Decreased knowledge of use of DME, Decreased mobility, Decreased range of motion, Decreased strength, Impaired perceived functional ability, Impaired flexibility, Improper body mechanics, Postural dysfunction, Impaired UE functional use  Visit Diagnosis: Hemiplegia and hemiparesis  following cerebral infarction affecting right dominant side (HCC)  Unsteadiness on feet  Muscle weakness (generalized)  Abnormal posture  Other abnormalities of gait and mobility     Problem List Patient Active Problem List   Diagnosis Date Noted  . Weight loss, unintentional 11/02/2017  . Candidiasis of scrotum 11/02/2017  . Diastolic dysfunction   . Dysphagia, post-stroke   . CKD (chronic kidney disease), stage III ([place]) 06/03/2017  . 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 Stroke (cerebrum) ([place]) 06/03/2017  . CVA (cerebral vascular accident) ([place]) 06/01/2017  . TIA (transient ischemic attack) 05/31/2017  . Fatigue 05/08/2017  . Aortic atherosclerosis ([place]) 05/08/2017  . Glaucoma 05/06/2017  . Lipoma of forehead 11/02/2016  . Type II diabetes mellitus with renal manifestations ([place]) 11/02/2016  . Hyperlipidemia LDL goal <100 11/02/2016  . Constipation in male 11/02/2016  . Insomnia due to anxiety and fear 11/02/2016  . Colon cancer screening 05/04/2015  . Vitamin D deficiency 05/02/2015  . Medicare annual wellness visit, subsequent 06/23/2013  . Essential hypertension, benign 03/22/2013  . Osteoarthritis 03/22/2013  . Numbness and tingling in left hand 03/22/2013  . GERD (gastroesophageal reflux disease) 03/22/2013  . BPH (benign prostatic hyperplasia) 03/22/2013    Cameron Sprang, PT, MPT [facility] [address] [place], [place], [address] Phone: [phone]   Fax:  [phone] 12/01/17, 8:20 PM  Name: LANNY LIPKIN MRN: [id number] Date of Birth: 1935-12-31

## 2017-12-01 NOTE — Therapy (Signed)
[facility] [address] [place], [place], [address] Phone: [phone]   Fax:  [phone]  Occupational Therapy Treatment  Patient Details  Name: Anthony Allen MRN: [id number] Date of Birth: August 20, 1935 Referring Provider: Venancio Poisson   Encounter Date: 12/01/2017  OT End of Session - 12/01/17 1612    Visit Number  11    Number of Visits  24    Date for OT Re-Evaluation  01/22/18    Authorization Type  [employer] Medicare.  $20 copay each OT/PT    Authorization - Visit Number  11    Authorization - Number of Visits  20    OT Start Time  0932    OT Stop Time  1530    OT Time Calculation (min)  45 min    Activity Tolerance  Patient tolerated treatment well    Behavior During Therapy  [facility] for tasks assessed/performed       Past Medical History:  Diagnosis Date  . Arthritis    "mild in my hands" (06/04/2017)  . Complication of anesthesia    "was told never to use Propofol because of parent's adverse reaction" (06/04/2017)  . CVA (cerebral vascular accident) ([place])    hospitalized from 12/22-12/24 due to left-sided weakness, slurred speech and difficulty swallowing. He was diagnosed as left pontine stroke./notes 06/03/2017  . Family history of adverse reaction to anesthesia    "mother and dad had allergic reaction Propofol" (06/04/2017)  . Glaucoma, both eyes   . Gout   . Hypertension   . Parotid mass   . Type 2 diabetes mellitus with hyperglycemia [facility])     Past Surgical History:  Procedure Laterality Date  . TONSILLECTOMY  1943    There were no vitals filed for this visit.  Subjective Assessment - 12/01/17 1451    Subjective   I can pull up my pants now with my right hand    Patient is accompained by:  Family member    Pertinent History  Pt with CVA in December of 2018.  Rehab at SNF then [facility].      Currently in Pain?  No/denies    Pain Score  0-No pain                   OT  Treatments/Exercises (OP) - 12/01/17 0001      ADLs   Bathing  Patient states showering is getting easier and faster with repetition.      ADL Comments  Discussed plan to discontinue hospital bed.  Patient is not intetrested in returning to a regular bed yet.  Discussed the benefit of reducing use of railings to pull to turn or sit up - rather to use core muscles for these activities to continue to build strength and endurance.        Neurological Re-education Exercises   Other Exercises 1  Supine - working to improve [facility] joint motion.  Patient with difficulty allowing passive motion - increased co-activation throughout shoulder girdle.  Worked in open chain passive to active assisted to improve shoulder flexion, shoulder - horizontal adduction and abduction.        Manual Therapy   Manual Therapy  Scapular mobilization    Scapular Mobilization  Patient with limited [place] joint motion beyond 75 degrees of right shoulder flexion / abduction.  Performed scapular mobilization and followed witrh total arm stretch (neural stretch)  OT Short Term Goals - 11/20/17 1702      OT SHORT TERM GOAL #1   Title  Patient will tshirt with set up assistance due 11/23/17    Time  4    Period  Weeks    Status  Achieved      OT SHORT TERM GOAL #2   Title  Patient will don socks with min assist    Time  4    Period  Weeks    Status  Achieved      OT SHORT TERM GOAL #3   Title  Patient will don pants with min assistance    Time  4    Period  Weeks    Status  Achieved      OT SHORT TERM GOAL #4   Title  Patient will complete HEP designed to improve shoulder range of motion with min assist and cueing    Time  4    Period  Weeks    Status  Achieved        OT Long Term Goals - 11/28/17 1636      OT LONG TERM GOAL #1   Title  Patient will dress himself with only occasional min assistance    Status  Achieved      OT LONG TERM GOAL #2   Title  Patient will transfer into and out  of shower with supervision    Status  Achieved      OT LONG TERM GOAL #3   Title  Patient will feed himself, and cut up his own food with set up assistance    Status  On-going      OT LONG TERM GOAL #4   Title  Patient will demonstrate at keast 5 degrees additional PIP flexion in digits 2-5 of right hand to aide in grasp and pinch use.      Status  Achieved      OT LONG TERM GOAL #5   Title  Patient will demonstrate active supination at least 10 degrees beyond neutral to aide in orienting hand to object needed for functional task    Status  On-going      OT LONG TERM GOAL #6   Title  Patient will demonstrate active low reach in right arm to obtain lightweight (1 lb or less)  item from countertop or tabletop if standing    Status  On-going      OT LONG TERM GOAL #7   Title  Patient will demonstrate at leaast 90 degrees of passive shoulder flexion or abduction with pain no greater than 2/10 to aide with hygiene and dressing tasks    Status  On-going            Plan - 12/01/17 1612    Clinical Impression Statement  Patient making steady improvement with functional mobility and ADL.      Occupational Profile and client history currently impacting functional performance  Husband, father - son with significant psychiatric dx- currently stable on medication, retired Quarry manager, Financial trader, Geophysicist/field seismologist, and Film/video editor  [employer]    Current Impairments/barriers affecting progress:  concern for RSD right UE     OT Frequency  2x / week    OT Duration  12 weeks    OT Treatment/Interventions  Self-care/ADL training;Electrical Stimulation;Therapeutic exercise;Visual/perceptual remediation/compensation;Patient/family education;Splinting;Neuromuscular education;Paraffin;Moist Heat;Aquatic Therapy;Traction;Fluidtherapy;Functional Mobility Training;Scar mobilization;Therapeutic activities;Balance training;Energy conservation;Cognitive remediation/compensation;Passive  range of motion;Manual Therapy;DME and/or AE instruction;Contrast Bath;Ultrasound;Cryotherapy    Plan  Start  Home activities program to address right hand function - shoulder NMR    Clinical Decision Making  Multiple treatment options, significant modification of task necessary       Patient will benefit from skilled therapeutic intervention in order to improve the following deficits and impairments:  Decreased cognition, Decreased knowledge of use of DME, Decreased skin integrity, Increased edema, Impaired flexibility, Impaired vision/preception, Pain, Abnormal gait, Cardiopulmonary status limiting activity, Decreased coordination, Decreased mobility, Increased fascial restrictions, Impaired sensation, Improper body mechanics, Decreased activity tolerance, Decreased endurance, Decreased range of motion, Decreased strength, Impaired tone, Improper spinal/pelvic alignment, Decreased balance, Decreased knowledge of precautions, Decreased safety awareness, Difficulty walking, Impaired perceived functional ability, Impaired UE functional use  Visit Diagnosis: Stiffness of right shoulder, not elsewhere classified  Stiffness of right wrist, not elsewhere classified  Stiffness of right hand, not elsewhere classified  Visuospatial deficit  Hemiplegia and hemiparesis following cerebral infarction affecting right dominant side (HCC)  Cognitive social or emotional deficit following cerebral infarction  Unsteadiness on feet  Muscle weakness (generalized)  Abnormal posture    Problem List Patient Active Problem List   Diagnosis Date Noted  . Weight loss, unintentional 11/02/2017  . Candidiasis of scrotum 11/02/2017  . Diastolic dysfunction   . Dysphagia, post-stroke   . CKD (chronic kidney disease), stage III ([place]) 06/03/2017  . 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 Stroke (cerebrum) ([place]) 06/03/2017  . CVA (cerebral vascular accident) ([place]) 06/01/2017  . TIA (transient ischemic attack)  05/31/2017  . Fatigue 05/08/2017  . Aortic atherosclerosis ([place]) 05/08/2017  . Glaucoma 05/06/2017  . Lipoma of forehead 11/02/2016  . Type II diabetes mellitus with renal manifestations ([place]) 11/02/2016  . Hyperlipidemia LDL goal <100 11/02/2016  . Constipation in male 11/02/2016  . Insomnia due to anxiety and fear 11/02/2016  . Colon cancer screening 05/04/2015  . Vitamin D deficiency 05/02/2015  . Medicare annual wellness visit, subsequent 06/23/2013  . Essential hypertension, benign 03/22/2013  . Osteoarthritis 03/22/2013  . Numbness and tingling in left hand 03/22/2013  . GERD (gastroesophageal reflux disease) 03/22/2013  . BPH (benign prostatic hyperplasia) 03/22/2013    Mariah Milling, OTR/L 12/01/2017, 4:13 PM  [facility] [address] [place], [place], [address] Phone: [phone]   Fax:  [phone]  Name: Anthony Allen MRN: [id number] Date of Birth: Sep 24, 1935

## 2017-12-03 ENCOUNTER — Encounter: Payer: Self-pay | Admitting: Internal Medicine

## 2017-12-03 ENCOUNTER — Ambulatory Visit: Payer: Medicare Other | Admitting: Internal Medicine

## 2017-12-03 VITALS — BP 106/68 | HR 76 | Temp 97.9°F | Resp 76 | Wt 138.0 lb

## 2017-12-03 DIAGNOSIS — R634 Abnormal weight loss: Secondary | ICD-10-CM | POA: Diagnosis not present

## 2017-12-03 DIAGNOSIS — I63212 Cerebral infarction due to unspecified occlusion or stenosis of left vertebral arteries: Secondary | ICD-10-CM | POA: Diagnosis not present

## 2017-12-03 DIAGNOSIS — E1129 Type 2 diabetes mellitus with other diabetic kidney complication: Secondary | ICD-10-CM | POA: Diagnosis not present

## 2017-12-03 LAB — POCT GLYCOSYLATED HEMOGLOBIN (HGB A1C): HbA1c, POC (controlled diabetic range): 5.9 % (ref 0.0–7.0)

## 2017-12-03 NOTE — Progress Notes (Signed)
Subjective:  Patient ID: Anthony Allen, male    DOB: 12-24-1935  Age: 82 y.o. MRN: [id number]  CC: The primary encounter diagnosis was Type 2 diabetes mellitus with other diabetic kidney complication, without long-term current use of insulin ([place]). 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  HPI Anthony Allen presents for 1 month follow up on diabetes,   Depression and unintentional  weight loss   remeron and psychology referal made last month  DM:   Patient has no complaints today.  Patient is not  following a low glycemic index diet but he is taking all prescribed medications regularly without side effects.  Fasting sugars have been under less than 140 most of the time and post prandials have been under 160 except on rare occasions. Patient is still receiving PT and OT a total fo 4 times per week and intentionally trying to gain  weight .  Patient has had an eye exam in the last 12 months and his wife checks feet regularly for signs of infection.  Patient does not walk barefoot outside.  He has  Persistent  burning in his left arm since the stroke and is taking gabapentin . Patient is up to date on all recommended vaccinations. He has noted a side effect of frequent urination (urinatiing every 2 hours) since he started taking jardiance.  He has noted some mildly  irritated skin I his left groin area. However, he had persistent  diarrhea with metformin,   Rehab twice week for OT and  For PT, making progress .  Walking with a cane at rehab,  Using a Walker at home mostly.  No recent falls. One close call due to cat getting in the way .  Outlook and demeanor much improved per patient, wife agrees   Outpatient Medications Prior to Visit  Medication Sig Dispense Refill  . acetaminophen (TYLENOL) 500 MG tablet Take 500 mg by mouth every 6 (six) hours as needed.    . ALPRAZolam  (XANAX) 0.5 MG tablet 30TK 1 T PO Q NIGHT SHIFT FOR ANXIETY / INSOMNIA 30 tablet 5  . Cholecalciferol (VITAMIN D-3) 1000 units CAPS Take 1,000 Units by mouth daily.    . clopidogrel (PLAVIX) 75 MG tablet TAKE 1 TABLET(75 MG) BY MOUTH DAILY 90 tablet 1  . fluticasone (FLONASE) 50 MCG/ACT nasal spray Place 2 sprays into both nostrils daily as needed for allergies or rhinitis.     Marland Kitchen HYDROCORTISONE, TOPICAL, 2 % LOTN Apply 1 application topically as needed (itching).    . JARDIANCE 10 MG TABS tablet TAKE 1 TABLET BY MOUTH EVERY DAY 90 tablet 1  . latanoprost (XALATAN) 0.005 % ophthalmic solution Instill 1 drop into both eyes in the evening  0  . losartan (COZAAR) 50 MG tablet Take 1 tablet (50 mg total) by mouth daily. 90 tablet 3  . Menthol, Topical Analgesic, (BLUE-EMU MAXIMUM STRENGTH EX) Apply topically.    . nystatin (NYSTATIN) powder Apply topically 2 (two) times daily. As needed for rash 60 g 2  . rosuvastatin (CRESTOR) 10 MG tablet Take 1 tablet (10 mg total) by mouth daily. 90 tablet 0  . timolol (TIMOPTIC) 0.5 % ophthalmic solution Instill 1 drop into the right eye in the morning  0   No facility-administered medications prior to visit.     Review of Systems;  Patient denies headache, fevers, malaise, unintentional weight loss, skin  rash, eye pain, sinus congestion and sinus pain, sore throat, dysphagia,  hemoptysis , cough, dyspnea, wheezing, chest pain, palpitations, orthopnea, edema, abdominal pain, nausea, melena, diarrhea, constipation, flank pain, dysuria, hematuria, urinary  Frequency, nocturia, numbness, tingling, seizures,  Focal weakness, Loss of consciousness,  Tremor, insomnia, depression, anxiety, and suicidal ideation.      Objective:  BP 106/68 (BP Location: Left Arm, Patient Position: Sitting, Cuff Size: Normal)   Pulse 76   Temp 97.9 F (36.6 C) (Oral)   Resp (!) 76   Wt 138 lb (62.6 kg)   SpO2 95%   BMI 20.38 kg/m   BP Readings from Last 3 Encounters:    12/03/17 106/68  11/04/17 104/61  10/31/17 110/70    Wt Readings from Last 3 Encounters:  12/03/17 138 lb (62.6 kg)  11/04/17 137 lb (62.1 kg)  10/31/17 135 lb 1.9 oz (61.3 kg)    General appearance: alert, cooperative and appears stated age Ears: normal TM's and external ear canals both ears Throat: lips, mucosa, and tongue normal; teeth and gums normal Neck: no adenopathy, no carotid bruit, supple, symmetrical, trachea midline and thyroid not enlarged, symmetric, no tenderness/mass/nodules Back: symmetric, no curvature. ROM normal. No CVA tenderness. Lungs: clear to auscultation bilaterally Heart: regular rate and rhythm, S1, S2 normal, no murmur, click, rub or gallop Abdomen: soft, non-tender; bowel sounds normal; no masses,  no organomegaly Pulses: 2+ and symmetric Skin: dime sized area of erythematous and denuded skin left groin. Otherwise Skin color, texture, turgor normal. No rashes or lesions Lymph nodes: Cervical, supraclavicular, and axillary nodes normal.  Lab Results  Component Value Date   HGBA1C 5.9 12/03/2017   HGBA1C 5.7 09/01/2017   HGBA1C 6.6 (H) 06/01/2017    Lab Results  Component Value Date   CREATININE 1.30 (H) 10/28/2017   CREATININE 1.26 09/01/2017   CREATININE 1.60 (H) 06/03/2017    Lab Results  Component Value Date   WBC 8.9 06/03/2017   HGB 18.0 (H) 06/03/2017   HCT 53.0 (H) 06/03/2017   PLT 197 06/03/2017   GLUCOSE 203 (H) 09/01/2017   CHOL 79 09/01/2017   TRIG 162.0 (H) 09/01/2017   HDL 29.80 (L) 09/01/2017   LDLDIRECT 117.0 05/06/2017   LDLCALC 16 09/01/2017   ALT 51 09/01/2017   AST 28 09/01/2017   NA 136 09/01/2017   K 4.2 09/01/2017   CL 101 09/01/2017   CREATININE 1.30 (H) 10/28/2017   BUN 27 (H) 09/01/2017   CO2 28 09/01/2017   TSH 1.561 06/01/2017   PSA 1.52 06/23/2013   INR 1.05 06/03/2017   HGBA1C 5.9 12/03/2017   MICROALBUR 5.3 (H) 05/06/2017    Mr Face/trigeminal Wo/w Cm  Result Date: 10/28/2017 CLINICAL DATA:   Left parapharyngeal mass. EXAM: MRI FACE TRIGEMINAL WITHOUT AND WITH CONTRAST TECHNIQUE: Multiplanar, multiecho pulse sequences of the face and surrounding structures, including thin slice imaging of the course of the Trigeminal Nerves, were obtained both before and after administration of intravenous contrast. CONTRAST:  51mL MULTIHANCE GADOBENATE DIMEGLUMINE 529 MG/ML IV SOLN COMPARISON:  Brain MRI 06/01/2017 FINDINGS: There are old infarcts of the right corona radiata and left pons. The visualized intracranial contents are otherwise unremarkable. 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 There is no extension into the skull base. The lesion measures approximately 4.0 x 0.7 x 2.3 cm. There is no other skull base abnormality. Dedicated trigeminal nerve imaging  was also performed. There is no focal abnormality along the trigeminal nerves. Meckel's cave is patent bilaterally. No abnormality at the foramina rotundum or ovale. IMPRESSION: 1. 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 Histologic sampling should be considered. 2. No extension through the skull base. Normal appearance of the visible cranial nerves. 3. 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 Electronically Signed   By: Ulyses Jarred M.D.   On: 10/28/2017 15:44    Assessment & Plan:   Problem List Items Addressed This Visit    Type II diabetes mellitus with renal manifestations ([place]) - Primary   Relevant Orders   POCT HgB A1C (Completed)   CVA (cerebral vascular accident) ([place])    Left pontine,  With right sided weakness.  He continues to regain his strength and  coordination in the right arm and leg with  Ongoing PT/OT      Weight loss, unintentional    Secondary to decreased appetite due to depression following his left brain CVA. He has gained 2 lbs but is well below his pre CVA weight. Advised to resume remeron without the other medications (alprazoalam and gabapentin )       A total of 25 minutes of face to face time was spent with patient more than half of which was spent in counselling about the above mentioned conditions  and coordination of care   I am having Anthony Allen maintain his HYDROCORTISONE (TOPICAL), latanoprost, timolol, fluticasone, losartan, Vitamin D-3, ALPRAZolam, rosuvastatin, nystatin, JARDIANCE, clopidogrel, (Menthol, Topical Analgesic, (BLUE-EMU MAXIMUM STRENGTH EX)), and acetaminophen.  No orders of the defined types were placed in this encounter.   There are no discontinued medications.  Follow-up: Return in about 3 months (around 03/05/2018) for follow up diabetes.   Crecencio Mc, MD

## 2017-12-03 NOTE — Patient Instructions (Addendum)
Your diabetes remains under excellent control currently. .Please return in 3 months for follow up on diabetes and make sure you are seeing your eye doctor at least once a year.   Try starting the remeron again tonight without the alprazolam  And without the gabapentin . You can start with 1/2 tablet , and take the other half in 30 minutes if needed   Gold Bond powder  With Zinc for groin area

## 2017-12-05 ENCOUNTER — Ambulatory Visit: Payer: Medicare Other | Admitting: Rehabilitation

## 2017-12-05 ENCOUNTER — Encounter: Payer: Self-pay | Admitting: Occupational Therapy

## 2017-12-05 ENCOUNTER — Encounter: Payer: Self-pay | Admitting: Rehabilitation

## 2017-12-05 ENCOUNTER — Ambulatory Visit: Payer: Medicare Other | Admitting: Occupational Therapy

## 2017-12-05 DIAGNOSIS — R41842 Visuospatial deficit: Secondary | ICD-10-CM

## 2017-12-05 DIAGNOSIS — M6281 Muscle weakness (generalized): Secondary | ICD-10-CM

## 2017-12-05 DIAGNOSIS — R2681 Unsteadiness on feet: Secondary | ICD-10-CM

## 2017-12-05 DIAGNOSIS — R293 Abnormal posture: Secondary | ICD-10-CM

## 2017-12-05 DIAGNOSIS — I69315 Cognitive social or emotional deficit following cerebral infarction: Secondary | ICD-10-CM

## 2017-12-05 DIAGNOSIS — M25611 Stiffness of right shoulder, not elsewhere classified: Secondary | ICD-10-CM

## 2017-12-05 DIAGNOSIS — I69351 Hemiplegia and hemiparesis following cerebral infarction affecting right dominant side: Secondary | ICD-10-CM | POA: Diagnosis not present

## 2017-12-05 DIAGNOSIS — M25641 Stiffness of right hand, not elsewhere classified: Secondary | ICD-10-CM

## 2017-12-05 DIAGNOSIS — R2689 Other abnormalities of gait and mobility: Secondary | ICD-10-CM

## 2017-12-05 DIAGNOSIS — M25631 Stiffness of right wrist, not elsewhere classified: Secondary | ICD-10-CM

## 2017-12-05 NOTE — Therapy (Signed)
[facility] [address] [place], [place], [address] Phone: [phone]   Fax:  [phone]  Occupational Therapy Treatment  Patient Details  Name: Anthony Allen MRN: [id number] Date of Birth: 02/24/1936 Referring Provider: Venancio Poisson   Encounter Date: 12/05/2017  OT End of Session - 12/05/17 1655    Visit Number  12    Number of Visits  24    Date for OT Re-Evaluation  01/22/18    Authorization Type  [employer] Medicare.  $20 copay each OT/PT    Authorization - Visit Number  12    Authorization - Number of Visits  20    OT Start Time  2376    OT Stop Time  1531    OT Time Calculation (min)  45 min    Activity Tolerance  Patient tolerated treatment well    Behavior During Therapy  [facility] for tasks assessed/performed       Past Medical History:  Diagnosis Date  . Arthritis    "mild in my hands" (06/04/2017)  . Complication of anesthesia    "was told never to use Propofol because of parent's adverse reaction" (06/04/2017)  . CVA (cerebral vascular accident) ([place])    hospitalized from 12/22-12/24 due to left-sided weakness, slurred speech and difficulty swallowing. He was diagnosed as left pontine stroke./notes 06/03/2017  . Family history of adverse reaction to anesthesia    "mother and dad had allergic reaction Propofol" (06/04/2017)  . Glaucoma, both eyes   . Gout   . Hypertension   . Parotid mass   . Type 2 diabetes mellitus with hyperglycemia [facility])     Past Surgical History:  Procedure Laterality Date  . TONSILLECTOMY  1943    There were no vitals filed for this visit.  Subjective Assessment - 12/05/17 1648    Subjective   It takes me a long time to dress myself    Patient is accompained by:  Family member    Pertinent History  Pt with CVA in December of 2018.  Rehab at SNF then [facility].      Currently in Pain?  No/denies    Pain Score  0-No pain                   OT  Treatments/Exercises (OP) - 12/05/17 0001      Neurological Re-education Exercises   Other Exercises 1  Supine working to improve passive to active assisted range of motion in [facility] and elbow joint.  Patient with poor scapular humeral dissociation.  Patient also with excessive coactivation around [facility] joint - limiting truly free glenohumeral joint motion.  Patient reports pain 4/10 beyond ~75-80* flexion or abduction.        Ultrasound   Ultrasound Location  shoulder, hand    Ultrasound Parameters  5cm2, 71mHZ, 0.8 w/cm2, continuous x 8 min each    Ultrasound Goals  Pain;Other (Comment) range of motion      Manual Therapy   Manual Therapy  Other (comment)    Manual therapy comments  Scapular stabilization with gentle humeral mobility               OT Short Term Goals - 11/20/17 1702      OT SHORT TERM GOAL #1   Title  Patient will tshirt with set up assistance due 11/23/17    Time  4    Period  Weeks    Status  Achieved      OT SHORT  TERM GOAL #2   Title  Patient will don socks with min assist    Time  4    Period  Weeks    Status  Achieved      OT SHORT TERM GOAL #3   Title  Patient will don pants with min assistance    Time  4    Period  Weeks    Status  Achieved      OT SHORT TERM GOAL #4   Title  Patient will complete HEP designed to improve shoulder range of motion with min assist and cueing    Time  4    Period  Weeks    Status  Achieved        OT Long Term Goals - 11/28/17 1636      OT LONG TERM GOAL #1   Title  Patient will dress himself with only occasional min assistance    Status  Achieved      OT LONG TERM GOAL #2   Title  Patient will transfer into and out of shower with supervision    Status  Achieved      OT LONG TERM GOAL #3   Title  Patient will feed himself, and cut up his own food with set up assistance    Status  On-going      OT LONG TERM GOAL #4   Title  Patient will demonstrate at keast 5 degrees additional PIP flexion in digits 2-5  of right hand to aide in grasp and pinch use.      Status  Achieved      OT LONG TERM GOAL #5   Title  Patient will demonstrate active supination at least 10 degrees beyond neutral to aide in orienting hand to object needed for functional task    Status  On-going      OT LONG TERM GOAL #6   Title  Patient will demonstrate active low reach in right arm to obtain lightweight (1 lb or less)  item from countertop or tabletop if standing    Status  On-going      OT LONG TERM GOAL #7   Title  Patient will demonstrate at leaast 90 degrees of passive shoulder flexion or abduction with pain no greater than 2/10 to aide with hygiene and dressing tasks    Status  On-going            Plan - 12/05/17 1655    Clinical Impression Statement  Patient making steady improvement with functional mobility and ADL.  Patient is making slower improvement in furthering passive / active of right shoulder    Occupational Profile and client history currently impacting functional performance  Husband, father - son with significant psychiatric dx- currently stable on medication, retired Quarry manager, Financial trader, Geophysicist/field seismologist, and Insurance claims handler performance deficits (Please refer to evaluation for details):  ADL's;IADL's;Rest and Sleep;Leisure;Social Participation    Rehab Potential  Fair    Current Impairments/barriers affecting progress:  concern for RSD right UE     OT Frequency  2x / week    OT Duration  12 weeks    OT Treatment/Interventions  Self-care/ADL training;Electrical Stimulation;Therapeutic exercise;Visual/perceptual remediation/compensation;Patient/family education;Splinting;Neuromuscular education;Paraffin;Moist Heat;Aquatic Therapy;Traction;Fluidtherapy;Functional Mobility Training;Scar mobilization;Therapeutic activities;Balance training;Energy conservation;Cognitive remediation/compensation;Passive range of motion;Manual Therapy;DME and/or AE instruction;Contrast  Bath;Ultrasound;Cryotherapy    Plan  Start Home activities program to address right hand function - shoulder NMR    Clinical Decision Making  Multiple treatment options, significant modification of task necessary  OT Home Exercise Plan  Need to determine what has already been established    Consulted and Agree with Plan of Care  Patient;Family member/caregiver    Family Member Consulted  wife - [name]       Patient will benefit from skilled therapeutic intervention in order to improve the following deficits and impairments:  Decreased cognition, Decreased knowledge of use of DME, Decreased skin integrity, Increased edema, Impaired flexibility, Impaired vision/preception, Pain, Abnormal gait, Cardiopulmonary status limiting activity, Decreased coordination, Decreased mobility, Increased fascial restrictions, Impaired sensation, Improper body mechanics, Decreased activity tolerance, Decreased endurance, Decreased range of motion, Decreased strength, Impaired tone, Improper spinal/pelvic alignment, Decreased balance, Decreased knowledge of precautions, Decreased safety awareness, Difficulty walking, Impaired perceived functional ability, Impaired UE functional use  Visit Diagnosis: Stiffness of right shoulder, not elsewhere classified  Stiffness of right wrist, not elsewhere classified  Stiffness of right hand, not elsewhere classified  Visuospatial deficit  Hemiplegia and hemiparesis following cerebral infarction affecting right dominant side (HCC)  Cognitive social or emotional deficit following cerebral infarction  Unsteadiness on feet  Muscle weakness (generalized)  Abnormal posture    Problem List Patient Active Problem List   Diagnosis Date Noted  . Weight loss, unintentional 11/02/2017  . Candidiasis of scrotum 11/02/2017  . Diastolic dysfunction   . Dysphagia, post-stroke   . CKD (chronic kidney disease), stage III ([place]) 06/03/2017  . Parapharyngeal space mass 06/03/2017   . Stroke (cerebrum) ([place]) 06/03/2017  . CVA (cerebral vascular accident) ([place]) 06/01/2017  . TIA (transient ischemic attack) 05/31/2017  . Fatigue 05/08/2017  . Aortic atherosclerosis ([place]) 05/08/2017  . Glaucoma 05/06/2017  . Lipoma of forehead 11/02/2016  . Type II diabetes mellitus with renal manifestations ([place]) 11/02/2016  . Hyperlipidemia LDL goal <100 11/02/2016  . Constipation in male 11/02/2016  . Insomnia due to anxiety and fear 11/02/2016  . Colon cancer screening 05/04/2015  . Vitamin D deficiency 05/02/2015  . Medicare annual wellness visit, subsequent 06/23/2013  . Essential hypertension, benign 03/22/2013  . Osteoarthritis 03/22/2013  . Numbness and tingling in left hand 03/22/2013  . GERD (gastroesophageal reflux disease) 03/22/2013  . BPH (benign prostatic hyperplasia) 03/22/2013    Mariah Milling, OTR/L 12/05/2017, 4:58 PM  [facility] [address] [place], [place], [address] Phone: [phone]   Fax:  [phone]  Name: Anthony Allen MRN: [id number] Date of Birth: 1935/09/19

## 2017-12-05 NOTE — Patient Instructions (Addendum)
Functional Quadriceps: Sit to Stand    Sit on edge of chair, feet flat on floor. Stand upright, extending knees fully. Look straight ahead.  Keep chest lifted and lean forward from your hips.  Keep knees apart when you stand and sit (shoulder width).   Repeat __10__ times per set. Do __1__ sets per session. Do __2__ sessions per day.    Hip Side Kick    Holding a chair or the counter for balance, keep legs shoulder width apart and toes pointed forward. Lift leg out to side, keeping knee straight. Do not lean. Hold 3 seconds. Repeat using other leg. Repeat __20__ times. Do __1__ sessions per day.    Hip Backward Kick    Using a chair or the kitchen counter for balance, keep legs shoulder width apart and toes pointed forward. Slowly lift one leg back, keeping knee straight. Do not lean forward. Hold for 3 seconds. Repeat with other leg. Repeat __20_ times. Do __1__ sessions per day.  Marching In-Place    Do this standing beside counter top (You can put walker beside you if you need R hand to be supported).  Standing straight, alternate bringing knees toward trunk. Bent arms swing alternately. Do _10__ sets. Do _2__ times per day.  Copyright  VHI. All rights reserved.   Squat: Double Leg (Supported)    With feet shoulder width apart, hold support. Squat, keeping lower leg vertical, knee in line with second toe. Use legs, do not pull up and down with arms.  Have a chair behind you for safety and also to aim bottom for.   Repeat _10__ times per set. Do 1-2 reps per day.   http://plyo.exer.us/74   Copyright  VHI. All rights reserved.

## 2017-12-06 NOTE — Assessment & Plan Note (Addendum)
Secondary to decreased appetite due to depression following his left brain CVA. He has gained 2 lbs but is well below his pre CVA weight. Advised to resume remeron without the other medications (alprazoalam and gabapentin )

## 2017-12-06 NOTE — Assessment & Plan Note (Signed)
Left pontine,  With right sided weakness.  He continues to regain his strength and coordination in the right arm and leg with  Ongoing PT/OT

## 2017-12-06 NOTE — Therapy (Signed)
[facility] [address] [place], [place], [address] Phone: [phone]   Fax:  [phone]  Physical Therapy Treatment  Patient Details  Name: Anthony Allen MRN: [id number] Date of Birth: 1935-07-02 Referring Provider: Venancio Poisson   Encounter Date: 12/05/2017  PT End of Session - 12/05/17 1407    Visit Number  12    Number of Visits  17    Date for PT Re-Evaluation  12/23/17    Authorization Type  [employer] Medicare - 10th visit PN     PT Start Time  1400    PT Stop Time  1445    PT Time Calculation (min)  45 min    Equipment Utilized During Treatment  Gait belt    Activity Tolerance  Patient tolerated treatment well    Behavior During Therapy  [facility] for tasks assessed/performed       Past Medical History:  Diagnosis Date  . Arthritis    "mild in my hands" (06/04/2017)  . Complication of anesthesia    "was told never to use Propofol because of parent's adverse reaction" (06/04/2017)  . CVA (cerebral vascular accident) ([place])    hospitalized from 12/22-12/24 due to left-sided weakness, slurred speech and difficulty swallowing. He was diagnosed as left pontine stroke./notes 06/03/2017  . Family history of adverse reaction to anesthesia    "mother and dad had allergic reaction Propofol" (06/04/2017)  . Glaucoma, both eyes   . Gout   . Hypertension   . Parotid mass   . Type 2 diabetes mellitus with hyperglycemia [facility])     Past Surgical History:  Procedure Laterality Date  . TONSILLECTOMY  1943    There were no vitals filed for this visit.  Subjective Assessment - 12/05/17 1404    Subjective  Pt reports doing well, had physical at [facility] and this went well.      Patient is accompained by:  Family member    Pertinent History  glaucoma    Limitations  House hold activities;Walking;Standing    Patient Stated Goals  "walk better."     Currently in Pain?  No/denies                        [facility] Adult PT Treatment/Exercise - 12/05/17 1405      Transfers   Comments  Performed sit<>stand as on HEP from mat without UE support.  Pt continues to adduct LEs together when standing for support, therefore provided cues during session to reduce this and also to keep chest lifted and flex forward from hips when standing.  Performed x 10 reps.        Ambulation/Gait   Ambulation/Gait  Yes    Ambulation/Gait Assistance  4: Min guard    Ambulation/Gait Assistance Details  Continue to address quality and distance of gait with use of quad tip cane with lace up shoes and foot up brace.  Pt doing very well and requires only min cues for posture and ensuring landing on R heel.  Pt reports that they have not been practicing walking much at home with quad tip cane, therefore PT provided education to pt and wife regarding the need to ambulate at home with quad tip cane (foot up brace) and CGA daily to increase confidence and quality of gait with device.  Both verbalized understanding.      Ambulation Distance (Feet)  230 Feet x 2 reps, several small bouts between tasks    Assistive  device  Straight cane quad tip cane with foot up brace    Gait Pattern  Step-through pattern;Decreased arm swing - right;Decreased arm swing - left;Decreased stride length;Decreased dorsiflexion - right;Decreased weight shift to right;Right foot flat;Trunk flexed;Narrow base of support    Ambulation Surface  Level;Indoor    Stairs  Yes    Stairs Assistance  5: Supervision    Stairs Assistance Details (indicate cue type and reason)  Pt reports he is going to another friends house next week that will have more stairs.  He is somewhat unsure of rail placement, but reports that there is a rail.  Therefore practiced during session if rail was only on R and then only on L when ascending with use of quad tip cane.  Provided cues on switching placement of cane, otherwise pt did very well performing  stairs.  Also provided homework over weekend for pt/wife practicing on getting into "man cave" which as 3 steps but no rail.  Continue to provide edcation on how to perform safely at home.      Stair Management Technique  One rail Right;One rail Left;Step to pattern;Forwards;With cane    Number of Stairs  4 x2 reps    Height of Stairs  6    Gait Comments  During gait, had pt work on negotiating around objects, through tight spaces and over carpet and thresholds to better simulate home.  Pt did very well scanning R environment with min cues for cane placement.        Neuro Re-ed    Neuro Re-ed Details   NMR during session to improve R step length and attention to R step; stepping over orange barriers (x 8 barriers x 4 reps) with use of quad tip cane and min A.  Cues for increased R hip and knee flexion.  Pt with moderate difficulty with task, therefore transitioned to standing at counter top with RUE support advancing and retro stepping RLE over small orange barrier (once R LE stepped over, had pt shift forward to load LE before retrostepping for improved R hip protraction) x 10 reps, marching x 10 reps with RUE support with cues for increased R proximal hip activation during stance, mini squats with BUE support x 10 reps with cues for technique and chair placed behind pt for safety and also for visual cue-added to HEP             PT Education - 12/05/17 1405    Education provided  Yes    Education Details  ambulating at home daily with wife and quad tip cane, trying to get into man cave this weekend.     Person(s) Educated  Patient;Spouse    Methods  Explanation    Comprehension  Verbalized understanding       PT Short Term Goals - 12/01/17 2019      PT SHORT TERM GOAL #1   Title  Pt/wife will initiate HEP in order to indicate improved functional mobility (Target date: 11/23/17)    Time  4    Period  Weeks    Status  New      PT SHORT TERM GOAL #2   Title  Pt will improve TUG to  </=33 secs w/ LRAD at S level in order to indicate decreased fall risk.      Baseline  21.94 secs with RW 11/20/17    Time  4    Period  Weeks    Status  Achieved  PT SHORT TERM GOAL #3   Title  Will assess BERG and improve score by 3 points from baseline in order to indicate decreased fall risk.     Baseline  41 at baseline, 39 on 11/20/17    Time  4    Period  Weeks    Status  Not Met      PT SHORT TERM GOAL #4   Title  Pt will improve 5TSS to </= 21 secs with single UE support only in order to indicate decreased fall risk and improved functional strength.      Baseline  11.81 secs with single UE support 11/20/17    Time  4    Period  Weeks    Status  Achieved      PT SHORT TERM GOAL #5   Title  Pt will improve gait speed to 1.94 ft/sec w/ LRAD at S level in order to indicate decreased fall risk.      Baseline  1.62 ft/sec when cued "walk your normal pace" and 2.77 ft/sec when cued to speed up slightly.  Feel that he is able to ambulate safely at a speed somewhere  between these two speeds.  11/20/17    Time  4    Period  Weeks    Status  Partially Met      PT SHORT TERM GOAL #6   Title  Pt will ambulate short household distances (up to 50') w/ quad cane at S level in order to indicate improved independence at home.     Baseline  requires intermittent min/guard (assessed with both SBQC and quad tip cane and recommend he continue with quad tip cane. )    Time  4    Period  Weeks    Status  Partially Met        PT Long Term Goals - 11/20/17 2048      PT LONG TERM GOAL #1   Title  Pt/wife will be independent with final HEP in order to indicate improved functional mobility and decreased fall risk. (Target Date: 12/23/17)    Time  8    Period  Weeks    Status  New      PT LONG TERM GOAL #2   Title  Pt will improve BERG balance score by 6 points from baseline in order to indicate decreased fall risk.     Time  8    Period  Weeks    Status  New      PT LONG TERM GOAL #3    Title  Pt will improve TUG time to </=19 secs w/ LRAD at mod I level in order to indicate decreased fall risk.      Time  8    Period  Weeks    Status  Revised      PT LONG TERM GOAL #4   Title  Pt will improve 5TSS to </=15 secs without UE support in order to indicate improved functional strength and decreased fall risk.      Time  8    Period  Weeks    Status  Revised      PT LONG TERM GOAL #5   Title  Pt will ambulate x 500' w/ LRAD over indoor surfaces at mod I level in order to indicate improved functional mobility.      Time  8    Period  Weeks    Status  New      PT LONG TERM GOAL #  6   Title  Pt will ambulate x 300' over unlevel paved outdoor surfaces w/ LRAD at S level in order to indicate improved community negotiation.      Time  8    Period  Weeks    Status  New            Plan - 12/05/17 1407    Clinical Impression Statement  Skilled session focused on improving quality and distance of gait with quad tip cane with use of lace up shoes and foot up brace.  Pt progressing very well, however feel that the can get to a more independent level if they will practice more at home.  Provided education for them to practice daily in order to increase confidence and quality of task.  Both verbalized understanding.     Rehab Potential  Good    Clinical Impairments Affecting Rehab Potential  Pt very motivated     PT Frequency  2x / week    PT Duration  8 weeks    PT Treatment/Interventions  ADLs/Self Care Home Management;Electrical Stimulation;DME Instruction;Gait training;Stair training;Functional mobility training;Therapeutic activities;Therapeutic exercise;Balance training;Neuromuscular re-education;Patient/family education;Orthotic Fit/Training;Passive range of motion;Vestibular    PT Next Visit Plan  Bioness as needed (R quad and R lower leg), R LE NMR (hip and knee extension, R DF), go over HEP intermittently, gait with quad tip cane, stairs with cane and no rails (to get  into man cave)    Consulted and Agree with Plan of Care  Patient;Family member/caregiver    Family Member Consulted  wife Baldwin [place] (goes by [employer])       Patient will benefit from skilled therapeutic intervention in order to improve the following deficits and impairments:  Abnormal gait, Decreased activity tolerance, Decreased balance, Decreased coordination, Decreased endurance, Decreased knowledge of use of DME, Decreased mobility, Decreased range of motion, Decreased strength, Impaired perceived functional ability, Impaired flexibility, Improper body mechanics, Postural dysfunction, Impaired UE functional use  Visit Diagnosis: Hemiplegia and hemiparesis following cerebral infarction affecting right dominant side (HCC)  Unsteadiness on feet  Muscle weakness (generalized)  Abnormal posture  Other abnormalities of gait and mobility     Problem List Patient Active Problem List   Diagnosis Date Noted  . Weight loss, unintentional 11/02/2017  . Candidiasis of scrotum 11/02/2017  . Diastolic dysfunction   . Dysphagia, post-stroke   . CKD (chronic kidney disease), stage III ([place]) 06/03/2017  . 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 Stroke (cerebrum) ([place]) 06/03/2017  . CVA (cerebral vascular accident) ([place]) 06/01/2017  . TIA (transient ischemic attack) 05/31/2017  . Fatigue 05/08/2017  . Aortic atherosclerosis ([place]) 05/08/2017  . Glaucoma 05/06/2017  . Lipoma of forehead 11/02/2016  . Type II diabetes mellitus with renal manifestations ([place]) 11/02/2016  . Hyperlipidemia LDL goal <100 11/02/2016  . Constipation in male 11/02/2016  . Insomnia due to anxiety and fear 11/02/2016  . Colon cancer screening 05/04/2015  . Vitamin D deficiency 05/02/2015  . Medicare annual wellness visit, subsequent 06/23/2013  . Essential hypertension, benign 03/22/2013  . Osteoarthritis 03/22/2013  . Numbness and tingling in left hand 03/22/2013  . GERD (gastroesophageal reflux disease) 03/22/2013   . BPH (benign prostatic hyperplasia) 03/22/2013    Cameron Sprang, PT, MPT [facility] [address] [place], [place], [address] Phone: [phone]   Fax:  [phone] 12/06/17, 7:33 AM  Name: Anthony Allen MRN: [id number] Date of Birth: [birth year]

## 2017-12-08 ENCOUNTER — Ambulatory Visit: Payer: Medicare Other | Attending: Adult Health | Admitting: Rehabilitation

## 2017-12-08 ENCOUNTER — Encounter: Payer: Self-pay | Admitting: Rehabilitation

## 2017-12-08 ENCOUNTER — Ambulatory Visit: Payer: Medicare Other | Admitting: Occupational Therapy

## 2017-12-08 ENCOUNTER — Encounter: Payer: Self-pay | Admitting: Occupational Therapy

## 2017-12-08 DIAGNOSIS — I69351 Hemiplegia and hemiparesis following cerebral infarction affecting right dominant side: Secondary | ICD-10-CM | POA: Insufficient documentation

## 2017-12-08 DIAGNOSIS — R2681 Unsteadiness on feet: Secondary | ICD-10-CM

## 2017-12-08 DIAGNOSIS — M25611 Stiffness of right shoulder, not elsewhere classified: Secondary | ICD-10-CM | POA: Insufficient documentation

## 2017-12-08 DIAGNOSIS — M6281 Muscle weakness (generalized): Secondary | ICD-10-CM

## 2017-12-08 DIAGNOSIS — M25641 Stiffness of right hand, not elsewhere classified: Secondary | ICD-10-CM | POA: Insufficient documentation

## 2017-12-08 DIAGNOSIS — R41842 Visuospatial deficit: Secondary | ICD-10-CM | POA: Insufficient documentation

## 2017-12-08 DIAGNOSIS — R293 Abnormal posture: Secondary | ICD-10-CM

## 2017-12-08 DIAGNOSIS — M25631 Stiffness of right wrist, not elsewhere classified: Secondary | ICD-10-CM | POA: Diagnosis present

## 2017-12-08 DIAGNOSIS — I69315 Cognitive social or emotional deficit following cerebral infarction: Secondary | ICD-10-CM | POA: Diagnosis present

## 2017-12-08 DIAGNOSIS — R2689 Other abnormalities of gait and mobility: Secondary | ICD-10-CM | POA: Diagnosis present

## 2017-12-08 NOTE — Therapy (Signed)
[facility] [address] [place], [place], [address] Phone: [phone]   Fax:  [phone]  Physical Therapy Treatment  Patient Details  Name: Anthony Allen MRN: [id number] Date of Birth: [birth year] Referring Provider: Venancio Poisson   Encounter Date: 12/08/2017  PT End of Session - 12/08/17 1412    Visit Number  13    Number of Visits  17    Date for PT Re-Evaluation  12/23/17    Authorization Type  [employer] Medicare - 10th visit PN     PT Start Time  1405    PT Stop Time  1446    PT Time Calculation (min)  41 min    Equipment Utilized During Treatment  Gait belt    Activity Tolerance  Patient tolerated treatment well    Behavior During Therapy  WFL for tasks assessed/performed       Past Medical History:  Diagnosis Date  . Arthritis    "mild in my hands" (06/04/2017)  . Complication of anesthesia    "was told never to use Propofol because of parent's adverse reaction" (06/04/2017)  . CVA (cerebral vascular accident) ([place])    hospitalized from 12/22-12/24 due to left-sided weakness, slurred speech and difficulty swallowing. He was diagnosed as left pontine stroke./notes 06/03/2017  . Family history of adverse reaction to anesthesia    "mother and dad had allergic reaction Propofol" (06/04/2017)  . Glaucoma, both eyes   . Gout   . Hypertension   . Parotid mass   . Type 2 diabetes mellitus with hyperglycemia [facility])     Past Surgical History:  Procedure Laterality Date  . TONSILLECTOMY  1943    There were no vitals filed for this visit.  Subjective Assessment - 12/08/17 1410    Subjective  Pt reports he had a busy weekend.      Patient is accompained by:  Family member    Pertinent History  glaucoma    Patient Stated Goals  "walk better."     Currently in Pain?  No/denies                       [facility] Adult PT Treatment/Exercise - 12/08/17 1410      Ambulation/Gait   Ambulation/Gait  Yes    Ambulation/Gait Assistance  5: Supervision;4: Min guard    Ambulation/Gait Assistance Details  Pt able to ambulate with quad tip cane and use of foot up brace at S to min/guard level today.  Marked improvement in gait speed and clearance with RLE.  Continue to provide intermittent facilitation for upright posture ([place]) and for improved R forward hip protraction during gait.  Pt does report he has been walking more with cane over the weekend.      Ambulation Distance (Feet)  300 Feet    Assistive device  Straight cane with quad tip    Gait Pattern  Step-through pattern;Decreased arm swing - right;Decreased arm swing - left;Decreased stride length;Decreased dorsiflexion - right;Decreased weight shift to right;Right foot flat;Trunk flexed;Narrow base of support    Ambulation Surface  Level;Indoor    Stairs  Yes    Stairs Assistance  4: Min guard    Stairs Assistance Details (indicate cue type and reason)  PT had pt negotiate stairs today with cane over and R hand held support in order to address safety with getting in/out of man cave.  He reports that they did not do this over the weekend due to wife  not feeling that she could assist him safely.  Pt did today at min/guard level, however did not press through RUE and was able to utilize cane well.      Stair Management Technique  No rails;Step to pattern;Forwards;With cane    Number of Stairs  4    Height of Stairs  6      Neuro Re-ed    Neuro Re-ed Details   Performed NMR during session for improved core/trunk activation and control as well as forced use of RUE/LE.  Had pt perform transitional movements getting into prone>L side prop>quadruped.  PT providing assist for RUE, however due to poor alignment and tightness in R bicep, unabel to have pt fully load RUE.  Transitioned several times out of quadruped (while attempting to get int position) increasing trunk actvation.  Then had pt work in prone on elbows elevating into  scapular protraction and eventually elevating waist from mat to further engage core.  Pt tolerated very well during session with moderate fatigue.  While in supine on EOM, perform R hip knee flex from mat to floor and back to mat x 10 reps with cues for improved control.               PT Education - 12/08/17 2041    Education provided  Yes    Education Details  Reviewed with wife how to don foot up brace properly.     Person(s) Educated  Patient;Spouse    Methods  Explanation;Demonstration    Comprehension  Verbalized understanding       PT Short Term Goals - 12/01/17 2019      PT SHORT TERM GOAL #1   Title  Pt/wife will initiate HEP in order to indicate improved functional mobility (Target date: 11/23/17)    Time  4    Period  Weeks    Status  New      PT SHORT TERM GOAL #2   Title  Pt will improve TUG to </=33 secs w/ LRAD at S level in order to indicate decreased fall risk.      Baseline  21.94 secs with RW 11/20/17    Time  4    Period  Weeks    Status  Achieved      PT SHORT TERM GOAL #3   Title  Will assess BERG and improve score by 3 points from baseline in order to indicate decreased fall risk.     Baseline  41 at baseline, 39 on 11/20/17    Time  4    Period  Weeks    Status  Not Met      PT SHORT TERM GOAL #4   Title  Pt will improve 5TSS to </= 21 secs with single UE support only in order to indicate decreased fall risk and improved functional strength.      Baseline  11.81 secs with single UE support 11/20/17    Time  4    Period  Weeks    Status  Achieved      PT SHORT TERM GOAL #5   Title  Pt will improve gait speed to 1.94 ft/sec w/ LRAD at S level in order to indicate decreased fall risk.      Baseline  1.62 ft/sec when cued "walk your normal pace" and 2.77 ft/sec when cued to speed up slightly.  Feel that he is able to ambulate safely at a speed somewhere  between these two speeds.  11/20/17  Time  4    Period  Weeks    Status  Partially Met       PT SHORT TERM GOAL #6   Title  Pt will ambulate short household distances (up to 50') w/ quad cane at S level in order to indicate improved independence at home.     Baseline  requires intermittent min/guard (assessed with both SBQC and quad tip cane and recommend he continue with quad tip cane. )    Time  4    Period  Weeks    Status  Partially Met        PT Long Term Goals - 11/20/17 2048      PT LONG TERM GOAL #1   Title  Pt/wife will be independent with final HEP in order to indicate improved functional mobility and decreased fall risk. (Target Date: 12/23/17)    Time  8    Period  Weeks    Status  New      PT LONG TERM GOAL #2   Title  Pt will improve BERG balance score by 6 points from baseline in order to indicate decreased fall risk.     Time  8    Period  Weeks    Status  New      PT LONG TERM GOAL #3   Title  Pt will improve TUG time to </=19 secs w/ LRAD at mod I level in order to indicate decreased fall risk.      Time  8    Period  Weeks    Status  Revised      PT LONG TERM GOAL #4   Title  Pt will improve 5TSS to </=15 secs without UE support in order to indicate improved functional strength and decreased fall risk.      Time  8    Period  Weeks    Status  Revised      PT LONG TERM GOAL #5   Title  Pt will ambulate x 500' w/ LRAD over indoor surfaces at mod I level in order to indicate improved functional mobility.      Time  8    Period  Weeks    Status  New      PT LONG TERM GOAL #6   Title  Pt will ambulate x 300' over unlevel paved outdoor surfaces w/ LRAD at S level in order to indicate improved community negotiation.      Time  8    Period  Weeks    Status  New            Plan - 12/08/17 2042    Clinical Impression Statement  Skilled session focused on improved core activation/stabilization along with forced use of RUE/LE with quadruped/prone tasks along with gait with quad tip cane and stair negotiation. Pt making excellent progress with  gait and stairs.      Rehab Potential  Good    Clinical Impairments Affecting Rehab Potential  Pt very motivated     PT Frequency  2x / week    PT Duration  8 weeks    PT Treatment/Interventions  ADLs/Self Care Home Management;Electrical Stimulation;DME Instruction;Gait training;Stair training;Functional mobility training;Therapeutic activities;Therapeutic exercise;Balance training;Neuromuscular re-education;Patient/family education;Orthotic Fit/Training;Passive range of motion;Vestibular    PT Next Visit Plan  Bioness as needed (R quad and R lower leg), R LE NMR (hip and knee extension, R DF), go over HEP intermittently, gait with quad tip cane, stairs with cane and no rails (  to get into man cave)    Consulted and Agree with Plan of Care  Patient;Family member/caregiver    Family Member Consulted  wife Baldwin [place] (goes by [employer])       Patient will benefit from skilled therapeutic intervention in order to improve the following deficits and impairments:  Abnormal gait, Decreased activity tolerance, Decreased balance, Decreased coordination, Decreased endurance, Decreased knowledge of use of DME, Decreased mobility, Decreased range of motion, Decreased strength, Impaired perceived functional ability, Impaired flexibility, Improper body mechanics, Postural dysfunction, Impaired UE functional use  Visit Diagnosis: Hemiplegia and hemiparesis following cerebral infarction affecting right dominant side (HCC)  Unsteadiness on feet  Muscle weakness (generalized)  Abnormal posture  Other abnormalities of gait and mobility     Problem List Patient Active Problem List   Diagnosis Date Noted  . Weight loss, unintentional 11/02/2017  . Candidiasis of scrotum 11/02/2017  . Diastolic dysfunction   . Dysphagia, post-stroke   . CKD (chronic kidney disease), stage III ([place]) 06/03/2017  . 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 Stroke (cerebrum) ([place]) 06/03/2017  . CVA (cerebral vascular accident)  ([place]) 06/01/2017  . Fatigue 05/08/2017  . Aortic atherosclerosis ([place]) 05/08/2017  . Glaucoma 05/06/2017  . Lipoma of forehead 11/02/2016  . Type II diabetes mellitus with renal manifestations ([place]) 11/02/2016  . Hyperlipidemia LDL goal <100 11/02/2016  . Constipation in male 11/02/2016  . Insomnia due to anxiety and fear 11/02/2016  . Colon cancer screening 05/04/2015  . Vitamin D deficiency 05/02/2015  . Medicare annual wellness visit, subsequent 06/23/2013  . Essential hypertension, benign 03/22/2013  . Osteoarthritis 03/22/2013  . Numbness and tingling in left hand 03/22/2013  . GERD (gastroesophageal reflux disease) 03/22/2013  . BPH (benign prostatic hyperplasia) 03/22/2013    Cameron Sprang, PT, MPT [facility] [address] [place], [place], [address] Phone: [phone]   Fax:  [phone] 12/08/17, 8:45 PM  Name: Anthony Allen MRN: [id number] Date of Birth: 04-27-1936

## 2017-12-08 NOTE — Therapy (Signed)
[facility] [address] [place], [place], [address] Phone: [phone]   Fax:  [phone]  Occupational Therapy Treatment  Patient Details  Name: Anthony Allen MRN: [id number] Date of Birth: 20-Nov-1935 Referring Provider: Venancio Poisson   Encounter Date: 12/08/2017  OT End of Session - 12/08/17 1415    Visit Number  13    Number of Visits  24    Date for OT Re-Evaluation  01/22/18    Authorization Type  [employer] Medicare.  $20 copay each OT/PT    Authorization - Visit Number  13    Authorization - Number of Visits  20    OT Start Time  1316    OT Stop Time  1400    OT Time Calculation (min)  44 min    Activity Tolerance  Patient tolerated treatment well    Behavior During Therapy  [facility] for tasks assessed/performed       Past Medical History:  Diagnosis Date  . Arthritis    "mild in my hands" (06/04/2017)  . Complication of anesthesia    "was told never to use Propofol because of parent's adverse reaction" (06/04/2017)  . CVA (cerebral vascular accident) ([place])    hospitalized from 12/22-12/24 due to left-sided weakness, slurred speech and difficulty swallowing. He was diagnosed as left pontine stroke./notes 06/03/2017  . Family history of adverse reaction to anesthesia    "mother and dad had allergic reaction Propofol" (06/04/2017)  . Glaucoma, both eyes   . Gout   . Hypertension   . Parotid mass   . Type 2 diabetes mellitus with hyperglycemia [facility])     Past Surgical History:  Procedure Laterality Date  . TONSILLECTOMY  1943    There were no vitals filed for this visit.  Subjective Assessment - 12/08/17 1322    Subjective   Patient still uses assistance for bathing once weekly.      Patient is accompained by:  Family member    Pertinent History  Pt with CVA in December of 2018.  Rehab at SNF then [facility].      Currently in Pain?  No/denies    Pain Score  0-No pain                   OT  Treatments/Exercises (OP) - 12/08/17 0001      Neurological Re-education Exercises   Other Exercises 1  Supine to sidelying on right arm.  Worked up to side sitting with support to control amount of weight through right upper extremity.  Limiting amount of excursion trunk shortening and lengthening helpful to reduce discompfrt - patient has small availbale painfree range.  Patient with difficulty coordination movements of head/trunk/ and right UE - facilitation to improve necessary interrelationships needed for reach patterns.  Patient needs increased time and facilitation to reduce tension in elbow flexors.               OT Education - 12/08/17 1414    Education provided  Yes    Education Details  importance of weight bearing to improve functional sue of RUE    Person(s) Educated  Patient    Methods  Explanation;Demonstration;Tactile cues;Verbal cues    Comprehension  Need further instruction       OT Short Term Goals - 11/20/17 1702      OT SHORT TERM GOAL #1   Title  Patient will tshirt with set up assistance due 11/23/17    Time  4  Period  Weeks    Status  Achieved      OT SHORT TERM GOAL #2   Title  Patient will don socks with min assist    Time  4    Period  Weeks    Status  Achieved      OT SHORT TERM GOAL #3   Title  Patient will don pants with min assistance    Time  4    Period  Weeks    Status  Achieved      OT SHORT TERM GOAL #4   Title  Patient will complete HEP designed to improve shoulder range of motion with min assist and cueing    Time  4    Period  Weeks    Status  Achieved        OT Long Term Goals - 11/28/17 1636      OT LONG TERM GOAL #1   Title  Patient will dress himself with only occasional min assistance    Status  Achieved      OT LONG TERM GOAL #2   Title  Patient will transfer into and out of shower with supervision    Status  Achieved      OT LONG TERM GOAL #3   Title  Patient will feed himself, and cut up his own food with  set up assistance    Status  On-going      OT LONG TERM GOAL #4   Title  Patient will demonstrate at keast 5 degrees additional PIP flexion in digits 2-5 of right hand to aide in grasp and pinch use.      Status  Achieved      OT LONG TERM GOAL #5   Title  Patient will demonstrate active supination at least 10 degrees beyond neutral to aide in orienting hand to object needed for functional task    Status  On-going      OT LONG TERM GOAL #6   Title  Patient will demonstrate active low reach in right arm to obtain lightweight (1 lb or less)  item from countertop or tabletop if standing    Status  On-going      OT LONG TERM GOAL #7   Title  Patient will demonstrate at leaast 90 degrees of passive shoulder flexion or abduction with pain no greater than 2/10 to aide with hygiene and dressing tasks    Status  On-going            Plan - 12/08/17 1415    Clinical Impression Statement  Patient continues to show improved functional use of RUE, and improved functional mobility.  Patient with significant limitations to end range in right hand, forearm, elbow, and shoulder    Occupational Profile and client history currently impacting functional performance  Husband, father - son with significant psychiatric dx- currently stable on medication, retired Quarry manager, Financial trader, Geophysicist/field seismologist, and Insurance claims handler performance deficits (Please refer to evaluation for details):  ADL's;IADL's;Rest and Sleep;Leisure;Social Participation    Rehab Potential  Fair    Current Impairments/barriers affecting progress:  concern for RSD right UE     OT Frequency  2x / week    OT Duration  12 weeks    OT Treatment/Interventions  Self-care/ADL training;Electrical Stimulation;Therapeutic exercise;Visual/perceptual remediation/compensation;Patient/family education;Splinting;Neuromuscular education;Paraffin;Moist Heat;Aquatic Therapy;Traction;Fluidtherapy;Functional Mobility Training;Scar  mobilization;Therapeutic activities;Balance training;Energy conservation;Cognitive remediation/compensation;Passive range of motion;Manual Therapy;DME and/or AE instruction;Contrast Bath;Ultrasound;Cryotherapy    Plan  shoulder manual therapy - body on  arm - NMR RUE/Trunk    Clinical Decision Making  Multiple treatment options, significant modification of task necessary    Consulted and Agree with Plan of Care  Patient;Family member/caregiver    Family Member Consulted  wife - [name]       Patient will benefit from skilled therapeutic intervention in order to improve the following deficits and impairments:  Decreased cognition, Decreased knowledge of use of DME, Decreased skin integrity, Increased edema, Impaired flexibility, Impaired vision/preception, Pain, Abnormal gait, Cardiopulmonary status limiting activity, Decreased coordination, Decreased mobility, Increased fascial restrictions, Impaired sensation, Improper body mechanics, Decreased activity tolerance, Decreased endurance, Decreased range of motion, Decreased strength, Impaired tone, Improper spinal/pelvic alignment, Decreased balance, Decreased knowledge of precautions, Decreased safety awareness, Difficulty walking, Impaired perceived functional ability, Impaired UE functional use  Visit Diagnosis: Stiffness of right shoulder, not elsewhere classified  Stiffness of right wrist, not elsewhere classified  Stiffness of right hand, not elsewhere classified  Visuospatial deficit  Hemiplegia and hemiparesis following cerebral infarction affecting right dominant side (HCC)  Cognitive social or emotional deficit following cerebral infarction  Unsteadiness on feet  Muscle weakness (generalized)  Abnormal posture    Problem List Patient Active Problem List   Diagnosis Date Noted  . Weight loss, unintentional 11/02/2017  . Candidiasis of scrotum 11/02/2017  . Diastolic dysfunction   . Dysphagia, post-stroke   . CKD (chronic  kidney disease), stage III ([place]) 06/03/2017  . 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 Stroke (cerebrum) ([place]) 06/03/2017  . CVA (cerebral vascular accident) ([place]) 06/01/2017  . Fatigue 05/08/2017  . Aortic atherosclerosis ([place]) 05/08/2017  . Glaucoma 05/06/2017  . Lipoma of forehead 11/02/2016  . Type II diabetes mellitus with renal manifestations ([place]) 11/02/2016  . Hyperlipidemia LDL goal <100 11/02/2016  . Constipation in male 11/02/2016  . Insomnia due to anxiety and fear 11/02/2016  . Colon cancer screening 05/04/2015  . Vitamin D deficiency 05/02/2015  . Medicare annual wellness visit, subsequent 06/23/2013  . Essential hypertension, benign 03/22/2013  . Osteoarthritis 03/22/2013  . Numbness and tingling in left hand 03/22/2013  . GERD (gastroesophageal reflux disease) 03/22/2013  . BPH (benign prostatic hyperplasia) 03/22/2013    Mariah Milling, OTR/L 12/08/2017, 2:18 PM  [facility] [address] [place], [place], [address] Phone: [phone]   Fax:  [phone]  Name: SHADE KALEY MRN: [id number] Date of Birth: 02-17-1936

## 2017-12-10 ENCOUNTER — Ambulatory Visit: Payer: Medicare Other | Admitting: Internal Medicine

## 2017-12-10 ENCOUNTER — Ambulatory Visit (INDEPENDENT_AMBULATORY_CARE_PROVIDER_SITE_OTHER): Payer: Medicare Other | Admitting: Psychology

## 2017-12-10 DIAGNOSIS — F4322 Adjustment disorder with anxiety: Secondary | ICD-10-CM | POA: Diagnosis not present

## 2017-12-12 ENCOUNTER — Ambulatory Visit: Payer: Medicare Other | Admitting: Occupational Therapy

## 2017-12-12 ENCOUNTER — Ambulatory Visit: Payer: Medicare Other | Admitting: Rehabilitation

## 2017-12-12 ENCOUNTER — Encounter: Payer: Self-pay | Admitting: Rehabilitation

## 2017-12-12 ENCOUNTER — Encounter: Payer: Self-pay | Admitting: Occupational Therapy

## 2017-12-12 DIAGNOSIS — R41842 Visuospatial deficit: Secondary | ICD-10-CM

## 2017-12-12 DIAGNOSIS — M25611 Stiffness of right shoulder, not elsewhere classified: Secondary | ICD-10-CM

## 2017-12-12 DIAGNOSIS — R2681 Unsteadiness on feet: Secondary | ICD-10-CM

## 2017-12-12 DIAGNOSIS — I69351 Hemiplegia and hemiparesis following cerebral infarction affecting right dominant side: Secondary | ICD-10-CM | POA: Diagnosis not present

## 2017-12-12 DIAGNOSIS — R2689 Other abnormalities of gait and mobility: Secondary | ICD-10-CM

## 2017-12-12 DIAGNOSIS — M25631 Stiffness of right wrist, not elsewhere classified: Secondary | ICD-10-CM

## 2017-12-12 DIAGNOSIS — I69315 Cognitive social or emotional deficit following cerebral infarction: Secondary | ICD-10-CM

## 2017-12-12 DIAGNOSIS — M25641 Stiffness of right hand, not elsewhere classified: Secondary | ICD-10-CM

## 2017-12-12 DIAGNOSIS — R293 Abnormal posture: Secondary | ICD-10-CM

## 2017-12-12 DIAGNOSIS — M6281 Muscle weakness (generalized): Secondary | ICD-10-CM

## 2017-12-12 NOTE — Patient Instructions (Signed)
Abduction: Clam (Eccentric) - Side-Lying    Lie on side with knees bent. Lift top knee, keeping feet together. Keep trunk steady. Slowly lower for 3-5 seconds. _10__ reps per set, _2__ sets per day, __5-7_ days per week.   http://ecce.exer.us/64   Copyright  VHI. All rights reserved.

## 2017-12-12 NOTE — Therapy (Signed)
[facility] [address] [place], [place], [address] Phone: [phone]   Fax:  [phone]  Occupational Therapy Treatment  Patient Details  Name: Anthony Allen MRN: [id number] Date of Birth: [birth year] Referring Provider: Venancio Poisson   Encounter Date: 12/12/2017  OT End of Session - 12/12/17 1546    Visit Number  14    Number of Visits  24    Date for OT Re-Evaluation  01/22/18    Authorization Type  [employer] Medicare.  $20 copay each OT/PT    Authorization - Visit Number  14    Authorization - Number of Visits  20    OT Start Time  2409    OT Stop Time  1535    OT Time Calculation (min)  50 min    Activity Tolerance  Patient tolerated treatment well    Behavior During Therapy  [facility] for tasks assessed/performed       Past Medical History:  Diagnosis Date  . Arthritis    "mild in my hands" (06/04/2017)  . Complication of anesthesia    "was told never to use Propofol because of parent's adverse reaction" (06/04/2017)  . CVA (cerebral vascular accident) ([place])    hospitalized from 12/22-12/24 due to left-sided weakness, slurred speech and difficulty swallowing. He was diagnosed as left pontine stroke./notes 06/03/2017  . Family history of adverse reaction to anesthesia    "mother and dad had allergic reaction Propofol" (06/04/2017)  . Glaucoma, both eyes   . Gout   . Hypertension   . Parotid mass   . Type 2 diabetes mellitus with hyperglycemia [facility])     Past Surgical History:  Procedure Laterality Date  . TONSILLECTOMY  1943    There were no vitals filed for this visit.  Subjective Assessment - 12/12/17 1540    Subjective   Patient continues with low level shoulder discomfort, for which he takes tylenol to manage.      Patient is accompained by:  Family member    Pertinent History  Pt with CVA in December of 2018.  Rehab at SNF then [facility].      Currently in Pain?  No/denies    Pain Score  0-No pain                    OT Treatments/Exercises (OP) - 12/12/17 0001      Neurological Re-education Exercises   Other Exercises 1  Neuromuscular reeducation to address lower trunk initiated movement.  Working to improve right trunk elongation, and improve shoulder motion.  Seated on physioball to help accentuate movement.  Patient compensates with excessive upper trunk motion - initially required mod assist to facilitate, but with repetition able to  initiate movement with only cueing.      Other Exercises 2  Seated- long arm weight bearing - patient with improved tolerance of wrist extension for weight bearing - body on arm movements.  Patient now with nearly full supination now in forearm due to much more routine functional use of right arm.,                 OT Short Term Goals - 11/20/17 1702      OT SHORT TERM GOAL #1   Title  Patient will tshirt with set up assistance due 11/23/17    Time  4    Period  Weeks    Status  Achieved      OT SHORT TERM GOAL #2  Title  Patient will don socks with min assist    Time  4    Period  Weeks    Status  Achieved      OT SHORT TERM GOAL #3   Title  Patient will don pants with min assistance    Time  4    Period  Weeks    Status  Achieved      OT SHORT TERM GOAL #4   Title  Patient will complete HEP designed to improve shoulder range of motion with min assist and cueing    Time  4    Period  Weeks    Status  Achieved        OT Long Term Goals - 11/28/17 1636      OT LONG TERM GOAL #1   Title  Patient will dress himself with only occasional min assistance    Status  Achieved      OT LONG TERM GOAL #2   Title  Patient will transfer into and out of shower with supervision    Status  Achieved      OT LONG TERM GOAL #3   Title  Patient will feed himself, and cut up his own food with set up assistance    Status  On-going      OT LONG TERM GOAL #4   Title  Patient will demonstrate at keast 5 degrees additional PIP  flexion in digits 2-5 of right hand to aide in grasp and pinch use.      Status  Achieved      OT LONG TERM GOAL #5   Title  Patient will demonstrate active supination at least 10 degrees beyond neutral to aide in orienting hand to object needed for functional task    Status  On-going      OT LONG TERM GOAL #6   Title  Patient will demonstrate active low reach in right arm to obtain lightweight (1 lb or less)  item from countertop or tabletop if standing    Status  On-going      OT LONG TERM GOAL #7   Title  Patient will demonstrate at leaast 90 degrees of passive shoulder flexion or abduction with pain no greater than 2/10 to aide with hygiene and dressing tasks    Status  On-going            Plan - 12/12/17 1546    Clinical Impression Statement  Patient continues to show improved functional use of RUE, and improved functional mobility.  Patient with significant limitations to end range in right hand, forearm, elbow, and shoulder    Occupational Profile and client history currently impacting functional performance  Husband, father - son with significant psychiatric dx- currently stable on medication, retired Quarry manager, Financial trader, Geophysicist/field seismologist, and Insurance claims handler performance deficits (Please refer to evaluation for details):  ADL's;IADL's;Rest and Sleep;Leisure;Social Participation    Rehab Potential  Fair    Current Impairments/barriers affecting progress:  concern for RSD right UE     OT Frequency  2x / week    OT Duration  12 weeks    OT Treatment/Interventions  Self-care/ADL training;Electrical Stimulation;Therapeutic exercise;Visual/perceptual remediation/compensation;Patient/family education;Splinting;Neuromuscular education;Paraffin;Moist Heat;Aquatic Therapy;Traction;Fluidtherapy;Functional Mobility Training;Scar mobilization;Therapeutic activities;Balance training;Energy conservation;Cognitive remediation/compensation;Passive range of motion;Manual  Therapy;DME and/or AE instruction;Contrast Bath;Ultrasound;Cryotherapy    Plan  shoulder manual therapy - body on arm - NMR RUE/Trunk    Clinical Decision Making  Multiple treatment options, significant modification of task necessary  Consulted and Agree with Plan of Care  Patient;Family member/caregiver    Family Member Consulted  wife - [name]       Patient will benefit from skilled therapeutic intervention in order to improve the following deficits and impairments:  Decreased cognition, Decreased knowledge of use of DME, Decreased skin integrity, Increased edema, Impaired flexibility, Impaired vision/preception, Pain, Abnormal gait, Cardiopulmonary status limiting activity, Decreased coordination, Decreased mobility, Increased fascial restrictions, Impaired sensation, Improper body mechanics, Decreased activity tolerance, Decreased endurance, Decreased range of motion, Decreased strength, Impaired tone, Improper spinal/pelvic alignment, Decreased balance, Decreased knowledge of precautions, Decreased safety awareness, Difficulty walking, Impaired perceived functional ability, Impaired UE functional use  Visit Diagnosis: Hemiplegia and hemiparesis following cerebral infarction affecting right dominant side (HCC)  Stiffness of right wrist, not elsewhere classified  Stiffness of right hand, not elsewhere classified  Stiffness of right shoulder, not elsewhere classified  Visuospatial deficit  Cognitive social or emotional deficit following cerebral infarction  Abnormal posture  Muscle weakness (generalized)  Unsteadiness on feet    Problem List Patient Active Problem List   Diagnosis Date Noted  . Weight loss, unintentional 11/02/2017  . Candidiasis of scrotum 11/02/2017  . Diastolic dysfunction   . Dysphagia, post-stroke   . CKD (chronic kidney disease), stage III ([place]) 06/03/2017  . 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 Stroke (cerebrum) ([place]) 06/03/2017  . CVA (cerebral  vascular accident) ([place]) 06/01/2017  . Fatigue 05/08/2017  . Aortic atherosclerosis ([place]) 05/08/2017  . Glaucoma 05/06/2017  . Lipoma of forehead 11/02/2016  . Type II diabetes mellitus with renal manifestations ([place]) 11/02/2016  . Hyperlipidemia LDL goal <100 11/02/2016  . Constipation in male 11/02/2016  . Insomnia due to anxiety and fear 11/02/2016  . Colon cancer screening 05/04/2015  . Vitamin D deficiency 05/02/2015  . Medicare annual wellness visit, subsequent 06/23/2013  . Essential hypertension, benign 03/22/2013  . Osteoarthritis 03/22/2013  . Numbness and tingling in left hand 03/22/2013  . GERD (gastroesophageal reflux disease) 03/22/2013  . BPH (benign prostatic hyperplasia) 03/22/2013    Mariah Milling, OTR/L 12/12/2017, 3:50 PM  [facility] [address] [place], [place], [address] Phone: [phone]   Fax:  [phone]  Name: Anthony Allen MRN: [id number] Date of Birth: [birth year]

## 2017-12-12 NOTE — Therapy (Signed)
[facility] [address] [place], [place], [address] Phone: [phone]   Fax:  [phone]  Physical Therapy Treatment  Patient Details  Name: Anthony Allen MRN: [id number] Date of Birth: 1935/10/15 Referring Provider: Venancio Poisson   Encounter Date: 12/12/2017  PT End of Session - 12/12/17 1516    Visit Number  14    Number of Visits  17    Date for PT Re-Evaluation  12/23/17    Authorization Type  [employer] Medicare - 10th visit PN     PT Start Time  1401    PT Stop Time  1445    PT Time Calculation (min)  44 min    Equipment Utilized During Treatment  Gait belt    Activity Tolerance  Patient tolerated treatment well    Behavior During Therapy  WFL for tasks assessed/performed       Past Medical History:  Diagnosis Date  . Arthritis    "mild in my hands" (06/04/2017)  . Complication of anesthesia    "was told never to use Propofol because of parent's adverse reaction" (06/04/2017)  . CVA (cerebral vascular accident) ([place])    hospitalized from 12/22-12/24 due to left-sided weakness, slurred speech and difficulty swallowing. He was diagnosed as left pontine stroke./notes 06/03/2017  . Family history of adverse reaction to anesthesia    "mother and dad had allergic reaction Propofol" (06/04/2017)  . Glaucoma, both eyes   . Gout   . Hypertension   . Parotid mass   . Type 2 diabetes mellitus with hyperglycemia [facility])     Past Surgical History:  Procedure Laterality Date  . TONSILLECTOMY  1943    There were no vitals filed for this visit.  Subjective Assessment - 12/12/17 1401    Subjective  Pt reports that visit with friends went well.  They had a ramp instead of stairs.     Patient is accompained by:  Family member    Pertinent History  glaucoma    Limitations  House hold activities;Walking;Standing    Patient Stated Goals  "walk better."     Currently in Pain?  No/denies                        [facility] Adult PT Treatment/Exercise - 12/12/17 1405      Ambulation/Gait   Ambulation/Gait  Yes    Ambulation/Gait Assistance  5: Supervision;4: Min guard    Ambulation/Gait Assistance Details  Continue to address gait with use of quad tip cane to increase quality, distance and confidence with task.  Pt able to ambulate today x 115' at [facility] only to close S at times with cues for posture, decreased R step length and improved forward R hip protraction during stance.  Good foot clearance today without use of foot up brace.  Intermittent cues for improved R heel strike.      Ambulation Distance (Feet)  115 Feet    Assistive device  Straight cane with quad tip attachment    Gait Pattern  Step-through pattern;Decreased arm swing - right;Decreased arm swing - left;Decreased stride length;Decreased dorsiflexion - right;Decreased weight shift to right;Right foot flat;Trunk flexed;Narrow base of support    Ambulation Surface  Level;Indoor      Neuro Re-ed    Neuro Re-ed Details   NMR for improved R LE proximal activation during stance position to carryover to gait.  While in // bars had pt stand on small rocker board biased horizontally  working to maintain midline x 2 sets of 20 secs with BUE support>single UE>no UE support within task progressing to lateral weight shifts x 10 reps with 5 sec hold on R LE to encourage R hip extension and R knee extension.  Tapping for increased activation.  Progressed to maintaining RLE on 4" step advancing LLE from ground up to step and then forward for both concentric and eccentric muscle control.  Pt needs tactile cues when elevating LLE backwards up to step (utilized single UE support).  RLE in stance on small half foam roll advancing/retrostepping again with emphasis on improved R heel to toe contact and R LE activation in stance.  Note that when ambulating posteriorly in // bars pt with heavy R hip drop, therefore addressed proximal hip  control during posterior gait at counter top with single UE support x 2 laps down and back with tactile facilitation for improved R glute activation during stance and also improved R knee flex/hip extension during swing.  Briefly assessed standing hip abd as he is doing at home.  Note that he tends to drop hip and rotate L to compensate therefore assessed clam exercises in SL with improved technique, therefore added to HEP and provided education to wife on how to assist at hip to maintain forward alignment.  Ended session with gait without AD to better address postural control (improved trunk mobility), improved R hip protraction and more upright posture.  Pt tends to "hang" on pelvis rather than activating core and hips during gait, however pt still has R hip drop during task and therefore ended with brief stent in tall kneeling lifting L knee off mat x 2-3 secs before coming back to mat.  Facilitation for upright chest/head and visual cue of bringing his hip to PTs hip for adequate lateral weight shift.  Performed x 10 reps.              PT Education - 12/12/17 1516    Education provided  Yes    Education Details  adding clam to exercises    Person(s) Educated  Patient;Spouse    Methods  Explanation;Demonstration;Handout    Comprehension  Verbalized understanding;Returned demonstration       PT Short Term Goals - 12/01/17 2019      PT SHORT TERM GOAL #1   Title  Pt/wife will initiate HEP in order to indicate improved functional mobility (Target date: 11/23/17)    Time  4    Period  Weeks    Status  New      PT SHORT TERM GOAL #2   Title  Pt will improve TUG to </=33 secs w/ LRAD at S level in order to indicate decreased fall risk.      Baseline  21.94 secs with RW 11/20/17    Time  4    Period  Weeks    Status  Achieved      PT SHORT TERM GOAL #3   Title  Will assess BERG and improve score by 3 points from baseline in order to indicate decreased fall risk.     Baseline  41 at  baseline, 39 on 11/20/17    Time  4    Period  Weeks    Status  Not Met      PT SHORT TERM GOAL #4   Title  Pt will improve 5TSS to </= 21 secs with single UE support only in order to indicate decreased fall risk and improved functional strength.  Baseline  11.81 secs with single UE support 11/20/17    Time  4    Period  Weeks    Status  Achieved      PT SHORT TERM GOAL #5   Title  Pt will improve gait speed to 1.94 ft/sec w/ LRAD at S level in order to indicate decreased fall risk.      Baseline  1.62 ft/sec when cued "walk your normal pace" and 2.77 ft/sec when cued to speed up slightly.  Feel that he is able to ambulate safely at a speed somewhere  between these two speeds.  11/20/17    Time  4    Period  Weeks    Status  Partially Met      PT SHORT TERM GOAL #6   Title  Pt will ambulate short household distances (up to 50') w/ quad cane at S level in order to indicate improved independence at home.     Baseline  requires intermittent min/guard (assessed with both SBQC and quad tip cane and recommend he continue with quad tip cane. )    Time  4    Period  Weeks    Status  Partially Met        PT Long Term Goals - 11/20/17 2048      PT LONG TERM GOAL #1   Title  Pt/wife will be independent with final HEP in order to indicate improved functional mobility and decreased fall risk. (Target Date: 12/23/17)    Time  8    Period  Weeks    Status  New      PT LONG TERM GOAL #2   Title  Pt will improve BERG balance score by 6 points from baseline in order to indicate decreased fall risk.     Time  8    Period  Weeks    Status  New      PT LONG TERM GOAL #3   Title  Pt will improve TUG time to </=19 secs w/ LRAD at mod I level in order to indicate decreased fall risk.      Time  8    Period  Weeks    Status  Revised      PT LONG TERM GOAL #4   Title  Pt will improve 5TSS to </=15 secs without UE support in order to indicate improved functional strength and decreased fall  risk.      Time  8    Period  Weeks    Status  Revised      PT LONG TERM GOAL #5   Title  Pt will ambulate x 500' w/ LRAD over indoor surfaces at mod I level in order to indicate improved functional mobility.      Time  8    Period  Weeks    Status  New      PT LONG TERM GOAL #6   Title  Pt will ambulate x 300' over unlevel paved outdoor surfaces w/ LRAD at S level in order to indicate improved community negotiation.      Time  8    Period  Weeks    Status  New            Plan - 12/12/17 1520    Clinical Impression Statement  Skilled session focused on NMR for improved postural control, proximal R LE activation in stance/forced use position along with improved trunk mobility during gait to carryover to improved balance and gait quality.  Pt tolerated very well.     Rehab Potential  Good    Clinical Impairments Affecting Rehab Potential  Pt very motivated     PT Frequency  2x / week    PT Duration  8 weeks    PT Treatment/Interventions  ADLs/Self Care Home Management;Electrical Stimulation;DME Instruction;Gait training;Stair training;Functional mobility training;Therapeutic activities;Therapeutic exercise;Balance training;Neuromuscular re-education;Patient/family education;Orthotic Fit/Training;Passive range of motion;Vestibular    PT Next Visit Plan  Bioness as needed (R quad and R lower leg), R LE NMR (hip and knee extension, R DF), go over HEP intermittently, gait with quad tip cane, stairs with cane and no rails (to get into man cave)    Consulted and Agree with Plan of Care  Patient;Family member/caregiver    Family Member Consulted  wife Baldwin [place] (goes by [employer])       Patient will benefit from skilled therapeutic intervention in order to improve the following deficits and impairments:  Abnormal gait, Decreased activity tolerance, Decreased balance, Decreased coordination, Decreased endurance, Decreased knowledge of use of DME, Decreased mobility, Decreased range of motion,  Decreased strength, Impaired perceived functional ability, Impaired flexibility, Improper body mechanics, Postural dysfunction, Impaired UE functional use  Visit Diagnosis: Hemiplegia and hemiparesis following cerebral infarction affecting right dominant side (HCC)  Unsteadiness on feet  Muscle weakness (generalized)  Abnormal posture  Other abnormalities of gait and mobility     Problem List Patient Active Problem List   Diagnosis Date Noted  . Weight loss, unintentional 11/02/2017  . Candidiasis of scrotum 11/02/2017  . Diastolic dysfunction   . Dysphagia, post-stroke   . CKD (chronic kidney disease), stage III ([place]) 06/03/2017  . 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 Stroke (cerebrum) ([place]) 06/03/2017  . CVA (cerebral vascular accident) ([place]) 06/01/2017  . Fatigue 05/08/2017  . Aortic atherosclerosis ([place]) 05/08/2017  . Glaucoma 05/06/2017  . Lipoma of forehead 11/02/2016  . Type II diabetes mellitus with renal manifestations ([place]) 11/02/2016  . Hyperlipidemia LDL goal <100 11/02/2016  . Constipation in male 11/02/2016  . Insomnia due to anxiety and fear 11/02/2016  . Colon cancer screening 05/04/2015  . Vitamin D deficiency 05/02/2015  . Medicare annual wellness visit, subsequent 06/23/2013  . Essential hypertension, benign 03/22/2013  . Osteoarthritis 03/22/2013  . Numbness and tingling in left hand 03/22/2013  . GERD (gastroesophageal reflux disease) 03/22/2013  . BPH (benign prostatic hyperplasia) 03/22/2013    Cameron Sprang, PT, MPT [facility] [address] [place], [place], [address] Phone: [phone]   Fax:  [phone] 12/12/17, 3:25 PM  Name: DOROTEO NICKOLSON MRN: [id number] Date of Birth: [birth year]

## 2017-12-15 ENCOUNTER — Ambulatory Visit: Payer: Medicare Other | Admitting: Occupational Therapy

## 2017-12-15 ENCOUNTER — Encounter: Payer: Self-pay | Admitting: Rehabilitation

## 2017-12-15 ENCOUNTER — Encounter: Payer: Self-pay | Admitting: Occupational Therapy

## 2017-12-15 ENCOUNTER — Ambulatory Visit: Payer: Medicare Other | Admitting: Rehabilitation

## 2017-12-15 DIAGNOSIS — R293 Abnormal posture: Secondary | ICD-10-CM

## 2017-12-15 DIAGNOSIS — M6281 Muscle weakness (generalized): Secondary | ICD-10-CM

## 2017-12-15 DIAGNOSIS — R41842 Visuospatial deficit: Secondary | ICD-10-CM

## 2017-12-15 DIAGNOSIS — R2681 Unsteadiness on feet: Secondary | ICD-10-CM

## 2017-12-15 DIAGNOSIS — I69351 Hemiplegia and hemiparesis following cerebral infarction affecting right dominant side: Secondary | ICD-10-CM

## 2017-12-15 DIAGNOSIS — M25611 Stiffness of right shoulder, not elsewhere classified: Secondary | ICD-10-CM

## 2017-12-15 DIAGNOSIS — R2689 Other abnormalities of gait and mobility: Secondary | ICD-10-CM

## 2017-12-15 DIAGNOSIS — I69315 Cognitive social or emotional deficit following cerebral infarction: Secondary | ICD-10-CM

## 2017-12-15 DIAGNOSIS — M25641 Stiffness of right hand, not elsewhere classified: Secondary | ICD-10-CM

## 2017-12-15 DIAGNOSIS — M25631 Stiffness of right wrist, not elsewhere classified: Secondary | ICD-10-CM

## 2017-12-15 NOTE — Therapy (Signed)
[facility] [address] [place], [place], [address] Phone: [phone]   Fax:  [phone]  Physical Therapy Treatment  Patient Details  Name: Anthony Allen MRN: [id number] Date of Birth: 1936/01/28 Referring Provider: Venancio Poisson   Encounter Date: 12/15/2017  PT End of Session - 12/15/17 2000    Visit Number  15    Number of Visits  17    Date for PT Re-Evaluation  12/23/17    Authorization Type  [employer] Medicare - 10th visit PN     PT Start Time  1402    PT Stop Time  1445    PT Time Calculation (min)  43 min    Equipment Utilized During Treatment  Gait belt    Activity Tolerance  Patient tolerated treatment well    Behavior During Therapy  WFL for tasks assessed/performed       Past Medical History:  Diagnosis Date  . Arthritis    "mild in my hands" (06/04/2017)  . Complication of anesthesia    "was told never to use Propofol because of parent's adverse reaction" (06/04/2017)  . CVA (cerebral vascular accident) ([place])    hospitalized from 12/22-12/24 due to left-sided weakness, slurred speech and difficulty swallowing. He was diagnosed as left pontine stroke./notes 06/03/2017  . Family history of adverse reaction to anesthesia    "mother and dad had allergic reaction Propofol" (06/04/2017)  . Glaucoma, both eyes   . Gout   . Hypertension   . Parotid mass   . Type 2 diabetes mellitus with hyperglycemia [facility])     Past Surgical History:  Procedure Laterality Date  . TONSILLECTOMY  1943    There were no vitals filed for this visit.  Subjective Assessment - 12/15/17 1407    Subjective  Reports they went in man cave over the weekend.  Did steps with cane and also ambulated in man cave with cane.     Patient is accompained by:  Family member    Pertinent History  glaucoma    Limitations  House hold activities;Walking;Standing    Patient Stated Goals  "walk better."     Currently in Pain?   No/denies              NMR:  Continue to address core/trunk strength, activation along with trunk/pelvic dissociation during session to carryover to improved gait quality.  While seated on mat, had pt prop onto R elbow and forearm and move body away from arm for improved core activation x 10 reps with light tactile cues initially for technique and cues for maintaining upright chest and anterior weight shift.  Progressed to having pt remain propped on R elbow/forearm reaching LUE for target held by PT to encourage forward weight shift over R hip x 5 reps with slight elevation onto RLE x 5 reps.  Seated on green balance disc with pts R elbow propped on small ball again moving body away from arm moving into R trunk shortening and L trunk lengthening x 5 reps with mild c/o pain in RUE therefore replaced ball with 6" block for improved comfort and continued with trunk activation x 10 reps in each direction (shortening and lengthening on both sides) with cues for improved anterior pelvic tilt and anterior weight shift.  Progressed with trunk dissociation with scooting forwards/backwards with feet supported but without UE support progressing to scooting without UE and LE support with cues for increased lateral weight shift for more adequate trunk activation.  Ended  seated tasks on large physioball transitioning from ant to posterior pelvic tilt with motion from hip/trunk and decreased motion from shoulders x 10 reps>lateral weight shifts x 10 reps, maintaining postural control while performing marching with LEs x 10 reps (2 sets).  Ended session with gait without AD initially with PT assisting from R side, however still note increased trunk extension and R hip drop, therefore PT transitioned to anterior to patient with UE support on PTs forearm and LUE support on PTs shoulder with pt providing tactile cues for less UE support and cues to move hips towards PT.  Note marked improvement using this technique.                    PT Education - 12/15/17 1407    Education provided  Yes    Education Details  purpose of improved trunk activation exercises    Person(s) Educated  Patient    Methods  Explanation    Comprehension  Verbalized understanding       PT Short Term Goals - 12/01/17 2019      PT SHORT TERM GOAL #1   Title  Pt/wife will initiate HEP in order to indicate improved functional mobility (Target date: 11/23/17)    Time  4    Period  Weeks    Status  New      PT SHORT TERM GOAL #2   Title  Pt will improve TUG to </=33 secs w/ LRAD at S level in order to indicate decreased fall risk.      Baseline  21.94 secs with RW 11/20/17    Time  4    Period  Weeks    Status  Achieved      PT SHORT TERM GOAL #3   Title  Will assess BERG and improve score by 3 points from baseline in order to indicate decreased fall risk.     Baseline  41 at baseline, 39 on 11/20/17    Time  4    Period  Weeks    Status  Not Met      PT SHORT TERM GOAL #4   Title  Pt will improve 5TSS to </= 21 secs with single UE support only in order to indicate decreased fall risk and improved functional strength.      Baseline  11.81 secs with single UE support 11/20/17    Time  4    Period  Weeks    Status  Achieved      PT SHORT TERM GOAL #5   Title  Pt will improve gait speed to 1.94 ft/sec w/ LRAD at S level in order to indicate decreased fall risk.      Baseline  1.62 ft/sec when cued "walk your normal pace" and 2.77 ft/sec when cued to speed up slightly.  Feel that he is able to ambulate safely at a speed somewhere  between these two speeds.  11/20/17    Time  4    Period  Weeks    Status  Partially Met      PT SHORT TERM GOAL #6   Title  Pt will ambulate short household distances (up to 50') w/ quad cane at S level in order to indicate improved independence at home.     Baseline  requires intermittent min/guard (assessed with both SBQC and quad tip cane and recommend he continue with quad tip  cane. )    Time  4    Period  Weeks  Status  Partially Met        PT Long Term Goals - 11/20/17 2048      PT LONG TERM GOAL #1   Title  Pt/wife will be independent with final HEP in order to indicate improved functional mobility and decreased fall risk. (Target Date: 12/23/17)    Time  8    Period  Weeks    Status  New      PT LONG TERM GOAL #2   Title  Pt will improve BERG balance score by 6 points from baseline in order to indicate decreased fall risk.     Time  8    Period  Weeks    Status  New      PT LONG TERM GOAL #3   Title  Pt will improve TUG time to </=19 secs w/ LRAD at mod I level in order to indicate decreased fall risk.      Time  8    Period  Weeks    Status  Revised      PT LONG TERM GOAL #4   Title  Pt will improve 5TSS to </=15 secs without UE support in order to indicate improved functional strength and decreased fall risk.      Time  8    Period  Weeks    Status  Revised      PT LONG TERM GOAL #5   Title  Pt will ambulate x 500' w/ LRAD over indoor surfaces at mod I level in order to indicate improved functional mobility.      Time  8    Period  Weeks    Status  New      PT LONG TERM GOAL #6   Title  Pt will ambulate x 300' over unlevel paved outdoor surfaces w/ LRAD at S level in order to indicate improved community negotiation.      Time  8    Period  Weeks    Status  New            Plan - 12/15/17 2000    Clinical Impression Statement  Skilled session focused on NMR for improved pelvic/trunk dissociation to carryover to improved postural control during gait.  Pt tolerated very well and note marked improvement in gait at end of session with PT providing light assist while facing pt during gait and cues for "leading with hips."  Pt making excellent progress and will plan to re-cert pt next week for additional 4 weeks.      Rehab Potential  Good    Clinical Impairments Affecting Rehab Potential  Pt very motivated     PT Frequency  2x /  week    PT Duration  8 weeks    PT Treatment/Interventions  ADLs/Self Care Home Management;Electrical Stimulation;DME Instruction;Gait training;Stair training;Functional mobility training;Therapeutic activities;Therapeutic exercise;Balance training;Neuromuscular re-education;Patient/family education;Orthotic Fit/Training;Passive range of motion;Vestibular    PT Next Visit Plan  Improved trunk/core activation to carryover to gait, Bioness as needed (R quad and R lower leg), R LE NMR (hip and knee extension, R DF), go over HEP intermittently, gait with quad tip cane, stairs with cane and no rails (to get into man cave)    Consulted and Agree with Plan of Care  Patient;Family member/caregiver    Family Member Consulted  wife Baldwin [place] (goes by [name])       Patient will benefit from skilled therapeutic intervention in order to improve the following deficits and impairments:  Abnormal gait, Decreased activity  tolerance, Decreased balance, Decreased coordination, Decreased endurance, Decreased knowledge of use of DME, Decreased mobility, Decreased range of motion, Decreased strength, Impaired perceived functional ability, Impaired flexibility, Improper body mechanics, Postural dysfunction, Impaired UE functional use  Visit Diagnosis: Hemiplegia and hemiparesis following cerebral infarction affecting right dominant side (HCC)  Abnormal posture  Muscle weakness (generalized)  Unsteadiness on feet  Other abnormalities of gait and mobility     Problem List Patient Active Problem List   Diagnosis Date Noted  . Weight loss, unintentional 11/02/2017  . Candidiasis of scrotum 11/02/2017  . Diastolic dysfunction   . Dysphagia, post-stroke   . CKD (chronic kidney disease), stage III ([place]) 06/03/2017  . 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 Stroke (cerebrum) ([place]) 06/03/2017  . CVA (cerebral vascular accident) ([place]) 06/01/2017  . Fatigue 05/08/2017  . Aortic atherosclerosis ([place]) 05/08/2017  .  Glaucoma 05/06/2017  . Lipoma of forehead 11/02/2016  . Type II diabetes mellitus with renal manifestations ([place]) 11/02/2016  . Hyperlipidemia LDL goal <100 11/02/2016  . Constipation in male 11/02/2016  . Insomnia due to anxiety and fear 11/02/2016  . Colon cancer screening 05/04/2015  . Vitamin D deficiency 05/02/2015  . Medicare annual wellness visit, subsequent 06/23/2013  . Essential hypertension, benign 03/22/2013  . Osteoarthritis 03/22/2013  . Numbness and tingling in left hand 03/22/2013  . GERD (gastroesophageal reflux disease) 03/22/2013  . BPH (benign prostatic hyperplasia) 03/22/2013    Cameron Sprang, PT, MPT [facility] [address] [place], [place], [address] Phone: [phone]   Fax:  [phone] 12/15/17, 8:17 PM  Name: DHRUV CHRISTINA MRN: [id number] Date of Birth: [birth year]

## 2017-12-15 NOTE — Therapy (Signed)
[facility] [address] [place], [place], [address] Phone: [phone]   Fax:  [phone]  Occupational Therapy Treatment  Patient Details  Name: Anthony Allen MRN: [id number] Date of Birth: [birth year] Referring Provider: Venancio Poisson   Encounter Date: 12/15/2017  OT End of Session - 12/15/17 1531    Visit Number  15    Number of Visits  24    Date for OT Re-Evaluation  01/22/18    Authorization Type  [employer] Medicare.  $20 copay each OT/PT    Authorization - Visit Number  15    Authorization - Number of Visits  20    OT Start Time  5643    OT Stop Time  1525    OT Time Calculation (min)  40 min    Activity Tolerance  Patient tolerated treatment well    Behavior During Therapy  [facility] for tasks assessed/performed       Past Medical History:  Diagnosis Date  . Arthritis    "mild in my hands" (06/04/2017)  . Complication of anesthesia    "was told never to use Propofol because of parent's adverse reaction" (06/04/2017)  . CVA (cerebral vascular accident) ([place])    hospitalized from 12/22-12/24 due to left-sided weakness, slurred speech and difficulty swallowing. He was diagnosed as left pontine stroke./notes 06/03/2017  . Family history of adverse reaction to anesthesia    "mother and dad had allergic reaction Propofol" (06/04/2017)  . Glaucoma, both eyes   . Gout   . Hypertension   . Parotid mass   . Type 2 diabetes mellitus with hyperglycemia [facility])     Past Surgical History:  Procedure Laterality Date  . TONSILLECTOMY  1943    There were no vitals filed for this visit.  Subjective Assessment - 12/15/17 1450    Subjective   I walked into my man cave with my cane!    Patient is accompained by:  Family member    Pertinent History  Pt with CVA in December of 2018.  Rehab at SNF then [facility].      Currently in Pain?  No/denies    Pain Score  0-No pain         [facility] OT Assessment - 12/15/17 0001       ROM / Strength   AROM / PROM / Strength  PROM      PROM   Overall PROM   Deficits    Overall PROM Comments  shoulder flexion 80*               OT Treatments/Exercises (OP) - 12/15/17 0001      Neurological Re-education Exercises   Other Exercises 1  Working to improve scapular humeral rhythm.  Patient with restrictions between scapula and humerus, needs manual assistance to move glenohumeral joint separately.  Worked on passive to active shoulder depression and horizontal adducton.  Shoulder flexion prior to stretch - passively was 8- degrees, after shoulder depression / adduction - shoulder flexion improved to 92 degrees active assited.               OT Education - 12/15/17 1530    Education provided  Yes    Education Details  shoulder motion before and after stretching    Person(s) Educated  Patient    Methods  Explanation;Demonstration;Tactile cues;Verbal cues    Comprehension  Need further instruction       OT Short Term Goals - 11/20/17 1702  OT SHORT TERM GOAL #1   Title  Patient will tshirt with set up assistance due 11/23/17    Time  4    Period  Weeks    Status  Achieved      OT SHORT TERM GOAL #2   Title  Patient will don socks with min assist    Time  4    Period  Weeks    Status  Achieved      OT SHORT TERM GOAL #3   Title  Patient will don pants with min assistance    Time  4    Period  Weeks    Status  Achieved      OT SHORT TERM GOAL #4   Title  Patient will complete HEP designed to improve shoulder range of motion with min assist and cueing    Time  4    Period  Weeks    Status  Achieved        OT Long Term Goals - 11/28/17 1636      OT LONG TERM GOAL #1   Title  Patient will dress himself with only occasional min assistance    Status  Achieved      OT LONG TERM GOAL #2   Title  Patient will transfer into and out of shower with supervision    Status  Achieved      OT LONG TERM GOAL #3   Title  Patient will feed  himself, and cut up his own food with set up assistance    Status  On-going      OT LONG TERM GOAL #4   Title  Patient will demonstrate at keast 5 degrees additional PIP flexion in digits 2-5 of right hand to aide in grasp and pinch use.      Status  Achieved      OT LONG TERM GOAL #5   Title  Patient will demonstrate active supination at least 10 degrees beyond neutral to aide in orienting hand to object needed for functional task    Status  On-going      OT LONG TERM GOAL #6   Title  Patient will demonstrate active low reach in right arm to obtain lightweight (1 lb or less)  item from countertop or tabletop if standing    Status  On-going      OT LONG TERM GOAL #7   Title  Patient will demonstrate at leaast 90 degrees of passive shoulder flexion or abduction with pain no greater than 2/10 to aide with hygiene and dressing tasks    Status  On-going            Plan - 12/15/17 1531    Clinical Impression Statement  Patient is making slow gains with shoulder rnage of motion due to tissue and capsular restrictions.      Occupational Profile and client history currently impacting functional performance  Husband, father - son with significant psychiatric dx- currently stable on medication, retired Quarry manager, Financial trader, Geophysicist/field seismologist, and Insurance claims handler performance deficits (Please refer to evaluation for details):  ADL's;IADL's;Rest and Sleep;Leisure;Social Participation    Rehab Potential  Fair    Current Impairments/barriers affecting progress:  concern for RSD right UE     OT Frequency  2x / week    OT Duration  12 weeks    OT Treatment/Interventions  Self-care/ADL training;Electrical Stimulation;Therapeutic exercise;Visual/perceptual remediation/compensation;Patient/family education;Splinting;Neuromuscular education;Paraffin;Moist Heat;Aquatic Therapy;Traction;Fluidtherapy;Functional Mobility Training;Scar mobilization;Therapeutic activities;Balance  training;Energy conservation;Cognitive remediation/compensation;Passive range of  motion;Manual Therapy;DME and/or AE instruction;Contrast Bath;Ultrasound;Cryotherapy    Plan  shoulder manual therapy - body on arm - NMR RUE/Trunk    Clinical Decision Making  Multiple treatment options, significant modification of task necessary    Consulted and Agree with Plan of Care  Patient;Family member/caregiver    Family Member Consulted  wife - [name]       Patient will benefit from skilled therapeutic intervention in order to improve the following deficits and impairments:  Decreased cognition, Decreased knowledge of use of DME, Decreased skin integrity, Increased edema, Impaired flexibility, Impaired vision/preception, Pain, Abnormal gait, Cardiopulmonary status limiting activity, Decreased coordination, Decreased mobility, Increased fascial restrictions, Impaired sensation, Improper body mechanics, Decreased activity tolerance, Decreased endurance, Decreased range of motion, Decreased strength, Impaired tone, Improper spinal/pelvic alignment, Decreased balance, Decreased knowledge of precautions, Decreased safety awareness, Difficulty walking, Impaired perceived functional ability, Impaired UE functional use  Visit Diagnosis: Hemiplegia and hemiparesis following cerebral infarction affecting right dominant side (HCC)  Stiffness of right wrist, not elsewhere classified  Stiffness of right hand, not elsewhere classified  Stiffness of right shoulder, not elsewhere classified  Visuospatial deficit  Cognitive social or emotional deficit following cerebral infarction  Abnormal posture  Muscle weakness (generalized)  Unsteadiness on feet    Problem List Patient Active Problem List   Diagnosis Date Noted  . Weight loss, unintentional 11/02/2017  . Candidiasis of scrotum 11/02/2017  . Diastolic dysfunction   . Dysphagia, post-stroke   . CKD (chronic kidney disease), stage III ([place]) 06/03/2017  .  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 Stroke (cerebrum) ([place]) 06/03/2017  . CVA (cerebral vascular accident) ([place]) 06/01/2017  . Fatigue 05/08/2017  . Aortic atherosclerosis ([place]) 05/08/2017  . Glaucoma 05/06/2017  . Lipoma of forehead 11/02/2016  . Type II diabetes mellitus with renal manifestations ([place]) 11/02/2016  . Hyperlipidemia LDL goal <100 11/02/2016  . Constipation in male 11/02/2016  . Insomnia due to anxiety and fear 11/02/2016  . Colon cancer screening 05/04/2015  . Vitamin D deficiency 05/02/2015  . Medicare annual wellness visit, subsequent 06/23/2013  . Essential hypertension, benign 03/22/2013  . Osteoarthritis 03/22/2013  . Numbness and tingling in left hand 03/22/2013  . GERD (gastroesophageal reflux disease) 03/22/2013  . BPH (benign prostatic hyperplasia) 03/22/2013    Mariah Milling, OTR/L 12/15/2017, 3:33 PM  [facility] [address] [place], [place], [address] Phone: [phone]   Fax:  [phone]  Name: MEHMET SCALLY MRN: [id number] Date of Birth: 1935-11-06

## 2017-12-18 ENCOUNTER — Ambulatory Visit: Payer: Medicare Other | Admitting: Rehabilitation

## 2017-12-18 ENCOUNTER — Encounter: Payer: Self-pay | Admitting: Rehabilitation

## 2017-12-18 ENCOUNTER — Ambulatory Visit: Payer: Medicare Other | Admitting: Occupational Therapy

## 2017-12-18 ENCOUNTER — Encounter: Payer: Self-pay | Admitting: Occupational Therapy

## 2017-12-18 DIAGNOSIS — R293 Abnormal posture: Secondary | ICD-10-CM

## 2017-12-18 DIAGNOSIS — R2681 Unsteadiness on feet: Secondary | ICD-10-CM

## 2017-12-18 DIAGNOSIS — I69351 Hemiplegia and hemiparesis following cerebral infarction affecting right dominant side: Secondary | ICD-10-CM | POA: Diagnosis not present

## 2017-12-18 DIAGNOSIS — I69315 Cognitive social or emotional deficit following cerebral infarction: Secondary | ICD-10-CM

## 2017-12-18 DIAGNOSIS — M25611 Stiffness of right shoulder, not elsewhere classified: Secondary | ICD-10-CM

## 2017-12-18 DIAGNOSIS — M25641 Stiffness of right hand, not elsewhere classified: Secondary | ICD-10-CM

## 2017-12-18 DIAGNOSIS — R2689 Other abnormalities of gait and mobility: Secondary | ICD-10-CM

## 2017-12-18 DIAGNOSIS — R41842 Visuospatial deficit: Secondary | ICD-10-CM

## 2017-12-18 DIAGNOSIS — M6281 Muscle weakness (generalized): Secondary | ICD-10-CM

## 2017-12-18 DIAGNOSIS — M25631 Stiffness of right wrist, not elsewhere classified: Secondary | ICD-10-CM

## 2017-12-18 NOTE — Therapy (Signed)
[facility] [address] [place], [place], [address] Phone: [phone]   Fax:  [phone]  Occupational Therapy Treatment  Patient Details  Name: Anthony Allen MRN: [id number] Date of Birth: March 21, 1936 Referring Provider: Venancio Poisson   Encounter Date: 12/18/2017  OT End of Session - 12/18/17 1653    Activity Tolerance  Patient tolerated treatment well    Behavior During Therapy  [facility] for tasks assessed/performed       Past Medical History:  Diagnosis Date  . Arthritis    "mild in my hands" (06/04/2017)  . Complication of anesthesia    "was told never to use Propofol because of parent's adverse reaction" (06/04/2017)  . CVA (cerebral vascular accident) ([place])    hospitalized from 12/22-12/24 due to left-sided weakness, slurred speech and difficulty swallowing. He was diagnosed as left pontine stroke./notes 06/03/2017  . Family history of adverse reaction to anesthesia    "mother and dad had allergic reaction Propofol" (06/04/2017)  . Glaucoma, both eyes   . Gout   . Hypertension   . Parotid mass   . Type 2 diabetes mellitus with hyperglycemia [facility])     Past Surgical History:  Procedure Laterality Date  . TONSILLECTOMY  1943    There were no vitals filed for this visit.  Subjective Assessment - 12/18/17 1450    Subjective   I can use my right hand to cut my food sometimes    Patient is accompained by:  Family member    Pertinent History  Pt with CVA in December of 2018.  Rehab at SNF then [facility].      Currently in Pain?  No/denies    Pain Score  0-No pain                   OT Treatments/Exercises (OP) - 12/18/17 0001      ADLs   Functional Mobility  Worked on functional ambulation with cane in left hand - using right hand functionally to open drawers, doors, refrigerator, carry lightweight objects of varying shape/ size.  Patient able to pick up and set down objects - tissue box  weight.  Patient with improved ability to approach object for task - eg, for loading dishwasher.        Neurological Re-education Exercises   Other Exercises 1  Neuromuscular reeducation to work in closed chain condition to increase functional range in right shoulder, and to improve interlimb coordination, e.g. wrist, forearm, elbow, and shoulder in 4 pt, and seated.      Other Exercises 2  UBE x 2 min forward and 2 min backward level 1             OT Education - 12/18/17 1650    Education provided  Yes    Education Details  discussed with patient and wife that he could assist with loading the dishwasher at home now.      Person(s) Educated  Patient    Methods  Explanation;Demonstration;Tactile cues;Verbal cues    Comprehension  Verbalized understanding;Returned demonstration       OT Short Term Goals - 11/20/17 1702      OT SHORT TERM GOAL #1   Title  Patient will tshirt with set up assistance due 11/23/17    Time  4    Period  Weeks    Status  Achieved      OT SHORT TERM GOAL #2   Title  Patient will don socks with min assist  Time  4    Period  Weeks    Status  Achieved      OT SHORT TERM GOAL #3   Title  Patient will don pants with min assistance    Time  4    Period  Weeks    Status  Achieved      OT SHORT TERM GOAL #4   Title  Patient will complete HEP designed to improve shoulder range of motion with min assist and cueing    Time  4    Period  Weeks    Status  Achieved        OT Long Term Goals - 11/28/17 1636      OT LONG TERM GOAL #1   Title  Patient will dress himself with only occasional min assistance    Status  Achieved      OT LONG TERM GOAL #2   Title  Patient will transfer into and out of shower with supervision    Status  Achieved      OT LONG TERM GOAL #3   Title  Patient will feed himself, and cut up his own food with set up assistance    Status  On-going      OT LONG TERM GOAL #4   Title  Patient will demonstrate at keast 5  degrees additional PIP flexion in digits 2-5 of right hand to aide in grasp and pinch use.      Status  Achieved      OT LONG TERM GOAL #5   Title  Patient will demonstrate active supination at least 10 degrees beyond neutral to aide in orienting hand to object needed for functional task    Status  On-going      OT LONG TERM GOAL #6   Title  Patient will demonstrate active low reach in right arm to obtain lightweight (1 lb or less)  item from countertop or tabletop if standing    Status  On-going      OT LONG TERM GOAL #7   Title  Patient will demonstrate at leaast 90 degrees of passive shoulder flexion or abduction with pain no greater than 2/10 to aide with hygiene and dressing tasks    Status  On-going            Plan - 12/18/17 1653    Clinical Impression Statement  Patient is making slower gains in range of shoulder and digits, however, functional abilities continue to show significant improvement due to improved functional mobility, activity tolerance, and balance.      Occupational Profile and client history currently impacting functional performance  Husband, father - son with significant psychiatric dx- currently stable on medication, retired Quarry manager, Financial trader, Geophysicist/field seismologist, and Insurance claims handler performance deficits (Please refer to evaluation for details):  ADL's;IADL's;Rest and Sleep;Leisure;Social Participation    Rehab Potential  Fair    Current Impairments/barriers affecting progress:  concern for RSD right UE     OT Frequency  2x / week    OT Duration  12 weeks    OT Treatment/Interventions  Self-care/ADL training;Electrical Stimulation;Therapeutic exercise;Visual/perceptual remediation/compensation;Patient/family education;Splinting;Neuromuscular education;Paraffin;Moist Heat;Aquatic Therapy;Traction;Fluidtherapy;Functional Mobility Training;Scar mobilization;Therapeutic activities;Balance training;Energy conservation;Cognitive  remediation/compensation;Passive range of motion;Manual Therapy;DME and/or AE instruction;Contrast Bath;Ultrasound;Cryotherapy    Plan  shoulder manual therapy - body on arm - NMR RUE/Trunk, functional use of right UE while ambulating with cane    Clinical Decision Making  Multiple treatment options, significant modification of task necessary  OT Home Exercise Plan  Need to determine what has already been established    Consulted and Agree with Plan of Care  Patient;Family member/caregiver    Family Member Consulted  wife - [name]       Patient will benefit from skilled therapeutic intervention in order to improve the following deficits and impairments:  Decreased cognition, Decreased knowledge of use of DME, Decreased skin integrity, Increased edema, Impaired flexibility, Impaired vision/preception, Pain, Abnormal gait, Cardiopulmonary status limiting activity, Decreased coordination, Decreased mobility, Increased fascial restrictions, Impaired sensation, Improper body mechanics, Decreased activity tolerance, Decreased endurance, Decreased range of motion, Decreased strength, Impaired tone, Improper spinal/pelvic alignment, Decreased balance, Decreased knowledge of precautions, Decreased safety awareness, Difficulty walking, Impaired perceived functional ability, Impaired UE functional use  Visit Diagnosis: Hemiplegia and hemiparesis following cerebral infarction affecting right dominant side (HCC)  Stiffness of right wrist, not elsewhere classified  Stiffness of right hand, not elsewhere classified  Stiffness of right shoulder, not elsewhere classified  Visuospatial deficit  Cognitive social or emotional deficit following cerebral infarction  Abnormal posture  Muscle weakness (generalized)  Unsteadiness on feet    Problem List Patient Active Problem List   Diagnosis Date Noted  . Weight loss, unintentional 11/02/2017  . Candidiasis of scrotum 11/02/2017  . Diastolic  dysfunction   . Dysphagia, post-stroke   . CKD (chronic kidney disease), stage III ([place]) 06/03/2017  . 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 Stroke (cerebrum) ([place]) 06/03/2017  . CVA (cerebral vascular accident) ([place]) 06/01/2017  . Fatigue 05/08/2017  . Aortic atherosclerosis ([place]) 05/08/2017  . Glaucoma 05/06/2017  . Lipoma of forehead 11/02/2016  . Type II diabetes mellitus with renal manifestations ([place]) 11/02/2016  . Hyperlipidemia LDL goal <100 11/02/2016  . Constipation in male 11/02/2016  . Insomnia due to anxiety and fear 11/02/2016  . Colon cancer screening 05/04/2015  . Vitamin D deficiency 05/02/2015  . Medicare annual wellness visit, subsequent 06/23/2013  . Essential hypertension, benign 03/22/2013  . Osteoarthritis 03/22/2013  . Numbness and tingling in left hand 03/22/2013  . GERD (gastroesophageal reflux disease) 03/22/2013  . BPH (benign prostatic hyperplasia) 03/22/2013    Mariah Milling , OTR/L 12/18/2017, 4:56 PM  [facility] [address] [place], [place], [address] Phone: [phone]   Fax:  [phone]  Name: ERIQUE KASER MRN: [id number] Date of Birth: 04-25-1936

## 2017-12-18 NOTE — Therapy (Signed)
[facility] [address] [place], [place], [address] Phone: [phone]   Fax:  [phone]  Physical Therapy Treatment  Patient Details  Name: Anthony Allen MRN: [id number] Date of Birth: [birth year] Referring Provider: Venancio Poisson   Encounter Date: 12/18/2017  PT End of Session - 12/18/17 2027    Visit Number  16    Number of Visits  17    Date for PT Re-Evaluation  12/23/17    Authorization Type  [employer] Medicare - 10th visit PN     PT Start Time  1405    PT Stop Time  1445    PT Time Calculation (min)  40 min    Equipment Utilized During Treatment  Gait belt    Activity Tolerance  Patient tolerated treatment well    Behavior During Therapy  [facility] for tasks assessed/performed       Past Medical History:  Diagnosis Date  . Arthritis    "mild in my hands" (06/04/2017)  . Complication of anesthesia    "was told never to use Propofol because of parent's adverse reaction" (06/04/2017)  . CVA (cerebral vascular accident) ([place])    hospitalized from 12/22-12/24 due to left-sided weakness, slurred speech and difficulty swallowing. He was diagnosed as left pontine stroke./notes 06/03/2017  . Family history of adverse reaction to anesthesia    "mother and dad had allergic reaction Propofol" (06/04/2017)  . Glaucoma, both eyes   . Gout   . Hypertension   . Parotid mass   . Type 2 diabetes mellitus with hyperglycemia [facility])     Past Surgical History:  Procedure Laterality Date  . TONSILLECTOMY  1943    There were no vitals filed for this visit.  Subjective Assessment - 12/18/17 1449    Subjective  "I think this would give me more confidence when I'm walking."  (In regards to use of Bioness and possible home unit use).     Patient is accompained by:  Family member    Pertinent History  glaucoma    Patient Stated Goals  "walk better."     Currently in Pain?  No/denies                        [facility] Adult PT Treatment/Exercise - 12/18/17 1405      Ambulation/Gait   Ambulation/Gait  Yes    Ambulation/Gait Assistance  4: Min guard;4: Min assist    Ambulation/Gait Assistance Details  Utilized Bioness during session with Bioness representative.  Used R lower leg cuff for improved DF assist for foot clearance and also on R thigh for improved stance activation.  Used both quad tip cane and no AD during session.  Initially only increased lower leg stim to sensory level due to R inattention, however note during gait, esp with fatigue and increased distration, he needed muscle activation stim, therefore increased settings.  Also had pt ambulate at times with lower cuff only vs lower cuff with thigh sensory component.  Pt did well in both manners with only intermittent steps having R knee flexed.  Feel that he could utilize this in clinic and possibly at home and may even be able to get rid of thigh unit eventually.  Pt interested in pursuing home unit, therefore PT to assist with filling out info.      Ambulation Distance (Feet)  230 Feet x 3 reps    Assistive device  Straight cane;None quad tip cane  Gait Pattern  Step-through pattern;Decreased arm swing - right;Decreased arm swing - left;Decreased stride length;Decreased dorsiflexion - right;Decreased weight shift to right;Right foot flat;Trunk flexed;Narrow base of support    Ambulation Surface  Level;Indoor    Stairs  Yes    Stairs Assistance  4: Min guard;4: Min assist    Stairs Assistance Details (indicate cue type and reason)  Utilized Bioness during stair training for NMR purposes to address improved R quad activation when both ascending and descending stairs.  Note marked improvement with both muscle activation stim and sensory stim (plus vibration) only during session.  Cues for decreased UE support.  Also had pt perform 8" steps with LUE support forwards/backwards to increase length of activation and  control needed in RLE.      Stair Management Technique  One rail Left;Two rails;Alternating pattern;Forwards    Number of Stairs  4 x 4 reps then 8" steps x 2 reps    Height of Stairs  6 and 8      Self-Care   Self-Care  Other Self-Care Comments    Other Self-Care Comments   Discussed use of Bioness during session and also pursuing at home.  Pt feels that he would be more likely to increase community ambulation if he had Bioness unit.  Discussed having pt work on [employer] unit during our sessions for increased carryover and also how pt would adjust settings (remote or smart phone) if approved for home unit.  also discussed getting both thigh and lower cuff unit with hopes of decreasing use of thigh unit and doing intermittent POC with PT to assess need/intensity of unit.  Pt verbalized understanding.              PT Education - 12/18/17 2027    Education provided  Yes    Education Details  see self care    Person(s) Educated  Patient    Methods  Explanation    Comprehension  Verbalized understanding       PT Short Term Goals - 12/01/17 2019      PT SHORT TERM GOAL #1   Title  Pt/wife will initiate HEP in order to indicate improved functional mobility (Target date: 11/23/17)    Time  4    Period  Weeks    Status  New      PT SHORT TERM GOAL #2   Title  Pt will improve TUG to </=33 secs w/ LRAD at S level in order to indicate decreased fall risk.      Baseline  21.94 secs with RW 11/20/17    Time  4    Period  Weeks    Status  Achieved      PT SHORT TERM GOAL #3   Title  Will assess BERG and improve score by 3 points from baseline in order to indicate decreased fall risk.     Baseline  41 at baseline, 39 on 11/20/17    Time  4    Period  Weeks    Status  Not Met      PT SHORT TERM GOAL #4   Title  Pt will improve 5TSS to </= 21 secs with single UE support only in order to indicate decreased fall risk and improved functional strength.      Baseline  11.81  secs with single UE support 11/20/17    Time  4    Period  Weeks    Status  Achieved      PT  SHORT TERM GOAL #5   Title  Pt will improve gait speed to 1.94 ft/sec w/ LRAD at S level in order to indicate decreased fall risk.      Baseline  1.62 ft/sec when cued "walk your normal pace" and 2.77 ft/sec when cued to speed up slightly.  Feel that he is able to ambulate safely at a speed somewhere  between these two speeds.  11/20/17    Time  4    Period  Weeks    Status  Partially Met      PT SHORT TERM GOAL #6   Title  Pt will ambulate short household distances (up to 50') w/ quad cane at S level in order to indicate improved independence at home.     Baseline  requires intermittent min/guard (assessed with both SBQC and quad tip cane and recommend he continue with quad tip cane. )    Time  4    Period  Weeks    Status  Partially Met        PT Long Term Goals - 11/20/17 2048      PT LONG TERM GOAL #1   Title  Pt/wife will be independent with final HEP in order to indicate improved functional mobility and decreased fall risk. (Target Date: 12/23/17)    Time  8    Period  Weeks    Status  New      PT LONG TERM GOAL #2   Title  Pt will improve BERG balance score by 6 points from baseline in order to indicate decreased fall risk.     Time  8    Period  Weeks    Status  New      PT LONG TERM GOAL #3   Title  Pt will improve TUG time to </=19 secs w/ LRAD at mod I level in order to indicate decreased fall risk.      Time  8    Period  Weeks    Status  Revised      PT LONG TERM GOAL #4   Title  Pt will improve 5TSS to </=15 secs without UE support in order to indicate improved functional strength and decreased fall risk.      Time  8    Period  Weeks    Status  Revised      PT LONG TERM GOAL #5   Title  Pt will ambulate x 500' w/ LRAD over indoor surfaces at mod I level in order to indicate improved functional mobility.      Time  8    Period  Weeks    Status  New      PT LONG  TERM GOAL #6   Title  Pt will ambulate x 300' over unlevel paved outdoor surfaces w/ LRAD at S level in order to indicate improved community negotiation.      Time  8    Period  Weeks    Status  New            Plan - 12/18/17 2028    Clinical Impression Statement  Skilled session utilized Bioness (lower leg and thigh unit) with Bioness representative present to better address pts deficits.  Pt tolerated very well and would be great candidate for home unit.  PT to pursue this and assist with paperwork.     Rehab Potential  Good    Clinical Impairments Affecting Rehab Potential  Pt very motivated     PT  Frequency  2x / week    PT Duration  8 weeks    PT Treatment/Interventions  ADLs/Self Care Home Management;Electrical Stimulation;DME Instruction;Gait training;Stair training;Functional mobility training;Therapeutic activities;Therapeutic exercise;Balance training;Neuromuscular re-education;Patient/family education;Orthotic Fit/Training;Passive range of motion;Vestibular    PT Next Visit Plan  (recert for 8 more weeks-may only see 4 weeks) Improved trunk/core activation to carryover to gait, Bioness each session (assist with paperwork for home unit)(R quad and R lower leg), R LE NMR (hip and knee extension, R DF), go over HEP intermittently, gait with quad tip cane, stairs with cane and no rails (to get into man cave)    Consulted and Agree with Plan of Care  Patient;Family member/caregiver    Family Member Consulted  wife Baldwin [place] (goes by [employer])       Patient will benefit from skilled therapeutic intervention in order to improve the following deficits and impairments:  Abnormal gait, Decreased activity tolerance, Decreased balance, Decreased coordination, Decreased endurance, Decreased knowledge of use of DME, Decreased mobility, Decreased range of motion, Decreased strength, Impaired perceived functional ability, Impaired flexibility, Improper body mechanics, Postural dysfunction, Impaired UE  functional use  Visit Diagnosis: Hemiplegia and hemiparesis following cerebral infarction affecting right dominant side (HCC)  Abnormal posture  Muscle weakness (generalized)  Unsteadiness on feet  Other abnormalities of gait and mobility     Problem List Patient Active Problem List   Diagnosis Date Noted  . Weight loss, unintentional 11/02/2017  . Candidiasis of scrotum 11/02/2017  . Diastolic dysfunction   . Dysphagia, post-stroke   . CKD (chronic kidney disease), stage III ([place]) 06/03/2017  . 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 Stroke (cerebrum) ([place]) 06/03/2017  . CVA (cerebral vascular accident) ([place]) 06/01/2017  . Fatigue 05/08/2017  . Aortic atherosclerosis ([place]) 05/08/2017  . Glaucoma 05/06/2017  . Lipoma of forehead 11/02/2016  . Type II diabetes mellitus with renal manifestations ([place]) 11/02/2016  . Hyperlipidemia LDL goal <100 11/02/2016  . Constipation in male 11/02/2016  . Insomnia due to anxiety and fear 11/02/2016  . Colon cancer screening 05/04/2015  . Vitamin D deficiency 05/02/2015  . Medicare annual wellness visit, subsequent 06/23/2013  . Essential hypertension, benign 03/22/2013  . Osteoarthritis 03/22/2013  . Numbness and tingling in left hand 03/22/2013  . GERD (gastroesophageal reflux disease) 03/22/2013  . BPH (benign prostatic hyperplasia) 03/22/2013    Cameron Sprang, PT, MPT [facility] [address] [place], [place], [address] Phone: [phone]   Fax:  [phone] 12/18/17, 8:33 PM  Name: Anthony Allen MRN: [id number] Date of Birth: 1935/08/30

## 2017-12-23 ENCOUNTER — Encounter: Payer: Self-pay | Admitting: Occupational Therapy

## 2017-12-23 ENCOUNTER — Ambulatory Visit: Payer: Medicare Other | Admitting: Occupational Therapy

## 2017-12-23 DIAGNOSIS — M25641 Stiffness of right hand, not elsewhere classified: Secondary | ICD-10-CM

## 2017-12-23 DIAGNOSIS — I69351 Hemiplegia and hemiparesis following cerebral infarction affecting right dominant side: Secondary | ICD-10-CM

## 2017-12-23 DIAGNOSIS — M25631 Stiffness of right wrist, not elsewhere classified: Secondary | ICD-10-CM

## 2017-12-23 DIAGNOSIS — R293 Abnormal posture: Secondary | ICD-10-CM

## 2017-12-23 DIAGNOSIS — R41842 Visuospatial deficit: Secondary | ICD-10-CM

## 2017-12-23 DIAGNOSIS — I69315 Cognitive social or emotional deficit following cerebral infarction: Secondary | ICD-10-CM

## 2017-12-23 DIAGNOSIS — M6281 Muscle weakness (generalized): Secondary | ICD-10-CM

## 2017-12-23 DIAGNOSIS — R2681 Unsteadiness on feet: Secondary | ICD-10-CM

## 2017-12-23 DIAGNOSIS — M25611 Stiffness of right shoulder, not elsewhere classified: Secondary | ICD-10-CM

## 2017-12-23 NOTE — Therapy (Signed)
[facility] [address] [place], [place], [address] Phone: [phone]   Fax:  [phone]  Occupational Therapy Treatment  Patient Details  Name: Anthony Allen MRN: [id number] Date of Birth: 15-Oct-1935 Referring Provider: Venancio Poisson   Encounter Date: 12/23/2017  OT End of Session - 12/23/17 1709    Visit Number  17    Number of Visits  24    Date for OT Re-Evaluation  01/22/18    Authorization Type  [employer]HC Medicare.  $20 copay each OT/PT    Authorization - Visit Number  17    Authorization - Number of Visits  20    OT Start Time  1402    OT Stop Time  1445    OT Time Calculation (min)  43 min    Activity Tolerance  Patient tolerated treatment well    Behavior During Therapy  [facility] for tasks assessed/performed       Past Medical History:  Diagnosis Date  . Arthritis    "mild in my hands" (06/04/2017)  . Complication of anesthesia    "was told never to use Propofol because of parent's adverse reaction" (06/04/2017)  . CVA (cerebral vascular accident) ([place])    hospitalized from 12/22-12/24 due to left-sided weakness, slurred speech and difficulty swallowing. He was diagnosed as left pontine stroke./notes 06/03/2017  . Family history of adverse reaction to anesthesia    "mother and dad had allergic reaction Propofol" (06/04/2017)  . Glaucoma, both eyes   . Gout   . Hypertension   . Parotid mass   . Type 2 diabetes mellitus with hyperglycemia [facility])     Past Surgical History:  Procedure Laterality Date  . TONSILLECTOMY  1943    There were no vitals filed for this visit.  Subjective Assessment - 12/23/17 1416    Subjective   Patient fell trying  to pick up remote control.      Patient is accompained by:  Family member    Pertinent History  Pt with CVA in December of 2018.  Rehab at SNF then [facility].      Currently in Pain?  No/denies    Pain Score  0-No pain         [facility] OT Assessment - 12/23/17  0001      Hand Function   Right Hand Gross Grasp  Impaired    Right Hand Grip (lbs)  15    Right Hand Lateral Pinch  7 lbs               OT Treatments/Exercises (OP) - 12/23/17 0001      ADLs   Eating  Worked to improve patient's ability to cut food using bilateral upper extremities.  Patient had difficulty exerting consistent sufficient pressure to cut with right hand in simulated setting, but able to stabilize with fork.  Patient to try at home.      Functional Mobility  Worked to improve functional mobility - walking with a cane while manipulating objects with right hand.  Patient now able to change hand, wrist, arm position while walking in busy environemtn with cane.  Patient requires facilitation to encourage better hip alignment during walking.        RUE Fluidotherapy   Number Minutes Fluidotherapy  10 Minutes    RUE Fluidotherapy Location  Hand;Wrist    Comments  Wrapped digits into flexion             OT Education - 12/23/17 1708  Education provided  Yes    Education Details  reviewed objective progress - grip and pinch strength    Person(s) Educated  Patient;Spouse    Methods  Explanation    Comprehension  Verbalized understanding       OT Short Term Goals - 11/20/17 1702      OT SHORT TERM GOAL #1   Title  Patient will tshirt with set up assistance due 11/23/17    Time  4    Period  Weeks    Status  Achieved      OT SHORT TERM GOAL #2   Title  Patient will don socks with min assist    Time  4    Period  Weeks    Status  Achieved      OT SHORT TERM GOAL #3   Title  Patient will don pants with min assistance    Time  4    Period  Weeks    Status  Achieved      OT SHORT TERM GOAL #4   Title  Patient will complete HEP designed to improve shoulder range of motion with min assist and cueing    Time  4    Period  Weeks    Status  Achieved        OT Long Term Goals - 11/28/17 1636      OT LONG TERM GOAL #1   Title  Patient will dress  himself with only occasional min assistance    Status  Achieved      OT LONG TERM GOAL #2   Title  Patient will transfer into and out of shower with supervision    Status  Achieved      OT LONG TERM GOAL #3   Title  Patient will feed himself, and cut up his own food with set up assistance    Status  On-going      OT LONG TERM GOAL #4   Title  Patient will demonstrate at keast 5 degrees additional PIP flexion in digits 2-5 of right hand to aide in grasp and pinch use.      Status  Achieved      OT LONG TERM GOAL #5   Title  Patient will demonstrate active supination at least 10 degrees beyond neutral to aide in orienting hand to object needed for functional task    Status  On-going      OT LONG TERM GOAL #6   Title  Patient will demonstrate active low reach in right arm to obtain lightweight (1 lb or less)  item from countertop or tabletop if standing    Status  On-going      OT LONG TERM GOAL #7   Title  Patient will demonstrate at leaast 90 degrees of passive shoulder flexion or abduction with pain no greater than 2/10 to aide with hygiene and dressing tasks    Status  On-going            Plan - 12/23/17 1709    Clinical Impression Statement  Patient has shown significant improvement with hand strength, and is showing improved functional grasp, release skills with RUE    Occupational Profile and client history currently impacting functional performance  Husband, father - son with significant psychiatric dx- currently stable on medication, retired Quarry manager, Financial trader, Geophysicist/field seismologist, and Insurance claims handler performance deficits (Please refer to evaluation for details):  ADL's;IADL's;Rest and Sleep;Leisure;Social Participation    Rehab Potential  Fair  Current Impairments/barriers affecting progress:  concern for RSD right UE     OT Frequency  2x / week    OT Duration  12 weeks    OT Treatment/Interventions  Self-care/ADL training;Electrical  Stimulation;Therapeutic exercise;Visual/perceptual remediation/compensation;Patient/family education;Splinting;Neuromuscular education;Paraffin;Moist Heat;Aquatic Therapy;Traction;Fluidtherapy;Functional Mobility Training;Scar mobilization;Therapeutic activities;Balance training;Energy conservation;Cognitive remediation/compensation;Passive range of motion;Manual Therapy;DME and/or AE instruction;Contrast Bath;Ultrasound;Cryotherapy    Plan  shoulder manual therapy - body on arm - NMR RUE/Trunk, functional use of right UE while ambulating with cane, Any additional ideas relating to finger flexion are welcomed!    Clinical Decision Making  Multiple treatment options, significant modification of task necessary    Consulted and Agree with Plan of Care  Patient;Family member/caregiver    Family Member Consulted  wife - [name]       Patient will benefit from skilled therapeutic intervention in order to improve the following deficits and impairments:  Decreased cognition, Decreased knowledge of use of DME, Decreased skin integrity, Increased edema, Impaired flexibility, Impaired vision/preception, Pain, Abnormal gait, Cardiopulmonary status limiting activity, Decreased coordination, Decreased mobility, Increased fascial restrictions, Impaired sensation, Improper body mechanics, Decreased activity tolerance, Decreased endurance, Decreased range of motion, Decreased strength, Impaired tone, Improper spinal/pelvic alignment, Decreased balance, Decreased knowledge of precautions, Decreased safety awareness, Difficulty walking, Impaired perceived functional ability, Impaired UE functional use  Visit Diagnosis: Hemiplegia and hemiparesis following cerebral infarction affecting right dominant side (HCC)  Stiffness of right wrist, not elsewhere classified  Stiffness of right hand, not elsewhere classified  Stiffness of right shoulder, not elsewhere classified  Visuospatial deficit  Cognitive social or  emotional deficit following cerebral infarction  Muscle weakness (generalized)  Abnormal posture  Unsteadiness on feet    Problem List Patient Active Problem List   Diagnosis Date Noted  . Weight loss, unintentional 11/02/2017  . Candidiasis of scrotum 11/02/2017  . Diastolic dysfunction   . Dysphagia, post-stroke   . CKD (chronic kidney disease), stage III ([place]) 06/03/2017  . 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 Stroke (cerebrum) ([place]) 06/03/2017  . CVA (cerebral vascular accident) ([place]) 06/01/2017  . Fatigue 05/08/2017  . Aortic atherosclerosis ([place]) 05/08/2017  . Glaucoma 05/06/2017  . Lipoma of forehead 11/02/2016  . Type II diabetes mellitus with renal manifestations ([place]) 11/02/2016  . Hyperlipidemia LDL goal <100 11/02/2016  . Constipation in male 11/02/2016  . Insomnia due to anxiety and fear 11/02/2016  . Colon cancer screening 05/04/2015  . Vitamin D deficiency 05/02/2015  . Medicare annual wellness visit, subsequent 06/23/2013  . Essential hypertension, benign 03/22/2013  . Osteoarthritis 03/22/2013  . Numbness and tingling in left hand 03/22/2013  . GERD (gastroesophageal reflux disease) 03/22/2013  . BPH (benign prostatic hyperplasia) 03/22/2013    Mariah Milling, OTR/L 12/23/2017, 5:12 PM  [facility] [address] [place], [place], [address] Phone: [phone]   Fax:  [phone]  Name: Anthony Allen MRN: [id number] Date of Birth: [birth year]

## 2017-12-25 ENCOUNTER — Ambulatory Visit: Payer: Medicare Other | Admitting: Occupational Therapy

## 2017-12-25 DIAGNOSIS — M6281 Muscle weakness (generalized): Secondary | ICD-10-CM

## 2017-12-25 DIAGNOSIS — M25631 Stiffness of right wrist, not elsewhere classified: Secondary | ICD-10-CM

## 2017-12-25 DIAGNOSIS — I69351 Hemiplegia and hemiparesis following cerebral infarction affecting right dominant side: Secondary | ICD-10-CM | POA: Diagnosis not present

## 2017-12-25 DIAGNOSIS — M25641 Stiffness of right hand, not elsewhere classified: Secondary | ICD-10-CM

## 2017-12-25 DIAGNOSIS — M25611 Stiffness of right shoulder, not elsewhere classified: Secondary | ICD-10-CM

## 2017-12-25 NOTE — Therapy (Signed)
[facility] [address] [place], [place], [address] Phone: [phone]   Fax:  [phone]  Occupational Therapy Treatment  Patient Details  Name: Anthony Allen MRN: [id number] Date of Birth: December 11, 1935 Referring Provider: Venancio Poisson   Encounter Date: 12/25/2017  OT End of Session - 12/25/17 1603    Visit Number  18    Number of Visits  24    Date for OT Re-Evaluation  01/22/18    Authorization Type  [employer] Medicare.  $20 copay each OT/PT    Authorization - Visit Number  18    Authorization - Number of Visits  20    OT Start Time  9702    OT Stop Time  1445    OT Time Calculation (min)  40 min    Activity Tolerance  Patient tolerated treatment well    Behavior During Therapy  [facility] for tasks assessed/performed       Past Medical History:  Diagnosis Date  . Arthritis    "mild in my hands" (06/04/2017)  . Complication of anesthesia    "was told never to use Propofol because of parent's adverse reaction" (06/04/2017)  . CVA (cerebral vascular accident) ([place])    hospitalized from 12/22-12/24 due to left-sided weakness, slurred speech and difficulty swallowing. He was diagnosed as left pontine stroke./notes 06/03/2017  . Family history of adverse reaction to anesthesia    "mother and dad had allergic reaction Propofol" (06/04/2017)  . Glaucoma, both eyes   . Gout   . Hypertension   . Parotid mass   . Type 2 diabetes mellitus with hyperglycemia [facility])     Past Surgical History:  Procedure Laterality Date  . TONSILLECTOMY  1943    There were no vitals filed for this visit.  Subjective Assessment - 12/25/17 1406    Subjective   mild right shoulder pain    Pertinent History  Pt with CVA in December of 2018.  Rehab at SNF then [facility].      Currently in Pain?  Yes    Pain Score  3     Pain Location  Shoulder    Pain Orientation  Right    Pain Descriptors / Indicators  Aching    Pain Type  Chronic pain     Pain Onset  More than a month ago    Pain Frequency  Intermittent    Aggravating Factors   malpositioning    Pain Relieving Factors  tylenol          Treatment: supine gentle joint mobs and scapular mobilization for RUE followed by AA/ROM closed chain shoulder flexion in supine with PVC pipe frame, therapist facilitating scapular position. A/ROM MP flexion then hook fist with therapist blocking MP joints in extension, min v.c/ facillitation Seated body on arm movements with lateral weight shifts while weightbearing through RUE, mod facilitation. Low range shoulder flexion weightbearing through tilted stool, min v.c                   OT Short Term Goals - 11/20/17 1702      OT SHORT TERM GOAL #1   Title  Patient will tshirt with set up assistance due 11/23/17    Time  4    Period  Weeks    Status  Achieved      OT SHORT TERM GOAL #2   Title  Patient will don socks with min assist    Time  4    Period  Weeks    Status  Achieved      OT SHORT TERM GOAL #3   Title  Patient will don pants with min assistance    Time  4    Period  Weeks    Status  Achieved      OT SHORT TERM GOAL #4   Title  Patient will complete HEP designed to improve shoulder range of motion with min assist and cueing    Time  4    Period  Weeks    Status  Achieved        OT Long Term Goals - 11/28/17 1636      OT LONG TERM GOAL #1   Title  Patient will dress himself with only occasional min assistance    Status  Achieved      OT LONG TERM GOAL #2   Title  Patient will transfer into and out of shower with supervision    Status  Achieved      OT LONG TERM GOAL #3   Title  Patient will feed himself, and cut up his own food with set up assistance    Status  On-going      OT LONG TERM GOAL #4   Title  Patient will demonstrate at keast 5 degrees additional PIP flexion in digits 2-5 of right hand to aide in grasp and pinch use.      Status  Achieved      OT LONG TERM GOAL #5    Title  Patient will demonstrate active supination at least 10 degrees beyond neutral to aide in orienting hand to object needed for functional task    Status  On-going      OT LONG TERM GOAL #6   Title  Patient will demonstrate active low reach in right arm to obtain lightweight (1 lb or less)  item from countertop or tabletop if standing    Status  On-going      OT LONG TERM GOAL #7   Title  Patient will demonstrate at leaast 90 degrees of passive shoulder flexion or abduction with pain no greater than 2/10 to aide with hygiene and dressing tasks    Status  On-going            Plan - 12/25/17 1603    Clinical Impression Statement  Pt is progressing towards goals. He demonstrates improved low range reach at end of session with RUE.    Occupational Profile and client history currently impacting functional performance  Husband, father - son with significant psychiatric dx- currently stable on medication, retired Quarry manager, Financial trader, Geophysicist/field seismologist, and Insurance claims handler performance deficits (Please refer to evaluation for details):  ADL's;IADL's;Rest and Sleep;Leisure;Social Participation    Rehab Potential  Fair    Current Impairments/barriers affecting progress:  concern for RSD right UE     OT Frequency  2x / week    OT Duration  12 weeks    OT Treatment/Interventions  Self-care/ADL training;Electrical Stimulation;Therapeutic exercise;Visual/perceptual remediation/compensation;Patient/family education;Splinting;Neuromuscular education;Paraffin;Moist Heat;Aquatic Therapy;Traction;Fluidtherapy;Functional Mobility Training;Scar mobilization;Therapeutic activities;Balance training;Energy conservation;Cognitive remediation/compensation;Passive range of motion;Manual Therapy;DME and/or AE instruction;Contrast Bath;Ultrasound;Cryotherapy    Plan  shoulder manual therapy - body on arm - NMR RUE/Trunk, functional use of right UE while ambulating with cane,     Consulted  and Agree with Plan of Care  Patient;Family member/caregiver    Family Member Consulted  wife [name]       Patient will benefit from skilled therapeutic intervention in order  to improve the following deficits and impairments:  Decreased cognition, Decreased knowledge of use of DME, Decreased skin integrity, Increased edema, Impaired flexibility, Impaired vision/preception, Pain, Abnormal gait, Cardiopulmonary status limiting activity, Decreased coordination, Decreased mobility, Increased fascial restrictions, Impaired sensation, Improper body mechanics, Decreased activity tolerance, Decreased endurance, Decreased range of motion, Decreased strength, Impaired tone, Improper spinal/pelvic alignment, Decreased balance, Decreased knowledge of precautions, Decreased safety awareness, Difficulty walking, Impaired perceived functional ability, Impaired UE functional use  Visit Diagnosis: Hemiplegia and hemiparesis following cerebral infarction affecting right dominant side (HCC)  Stiffness of right wrist, not elsewhere classified  Stiffness of right hand, not elsewhere classified  Stiffness of right shoulder, not elsewhere classified  Muscle weakness (generalized)    Problem List Patient Active Problem List   Diagnosis Date Noted  . Weight loss, unintentional 11/02/2017  . Candidiasis of scrotum 11/02/2017  . Diastolic dysfunction   . Dysphagia, post-stroke   . CKD (chronic kidney disease), stage III ([place]) 06/03/2017  . 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 Stroke (cerebrum) ([place]) 06/03/2017  . CVA (cerebral vascular accident) ([place]) 06/01/2017  . Fatigue 05/08/2017  . Aortic atherosclerosis ([place]) 05/08/2017  . Glaucoma 05/06/2017  . Lipoma of forehead 11/02/2016  . Type II diabetes mellitus with renal manifestations ([place]) 11/02/2016  . Hyperlipidemia LDL goal <100 11/02/2016  . Constipation in male 11/02/2016  . Insomnia due to anxiety and fear 11/02/2016  . Colon cancer screening  05/04/2015  . Vitamin D deficiency 05/02/2015  . Medicare annual wellness visit, subsequent 06/23/2013  . Essential hypertension, benign 03/22/2013  . Osteoarthritis 03/22/2013  . Numbness and tingling in left hand 03/22/2013  . GERD (gastroesophageal reflux disease) 03/22/2013  . BPH (benign prostatic hyperplasia) 03/22/2013    Leondro Coryell 12/25/2017, 4:05 PM Theone Murdoch, OTR/L [phone] Phone: [phone] 4:09 PM 12/25/17 [facility] [address] [place], [place], [address] Phone: [phone]   Fax:  [phone]  Name: Anthony Allen MRN: [id number] Date of Birth: [birth year]

## 2017-12-29 ENCOUNTER — Encounter: Payer: Self-pay | Admitting: Physical Therapy

## 2017-12-29 ENCOUNTER — Encounter: Payer: Self-pay | Admitting: Occupational Therapy

## 2017-12-29 ENCOUNTER — Ambulatory Visit: Payer: Medicare Other | Admitting: Physical Therapy

## 2017-12-29 ENCOUNTER — Ambulatory Visit: Payer: Medicare Other | Admitting: Occupational Therapy

## 2017-12-29 DIAGNOSIS — M6281 Muscle weakness (generalized): Secondary | ICD-10-CM

## 2017-12-29 DIAGNOSIS — M25611 Stiffness of right shoulder, not elsewhere classified: Secondary | ICD-10-CM

## 2017-12-29 DIAGNOSIS — R293 Abnormal posture: Secondary | ICD-10-CM

## 2017-12-29 DIAGNOSIS — M25631 Stiffness of right wrist, not elsewhere classified: Secondary | ICD-10-CM

## 2017-12-29 DIAGNOSIS — M25641 Stiffness of right hand, not elsewhere classified: Secondary | ICD-10-CM

## 2017-12-29 DIAGNOSIS — R2681 Unsteadiness on feet: Secondary | ICD-10-CM

## 2017-12-29 DIAGNOSIS — I69351 Hemiplegia and hemiparesis following cerebral infarction affecting right dominant side: Secondary | ICD-10-CM | POA: Diagnosis not present

## 2017-12-29 DIAGNOSIS — R41842 Visuospatial deficit: Secondary | ICD-10-CM

## 2017-12-29 DIAGNOSIS — I69315 Cognitive social or emotional deficit following cerebral infarction: Secondary | ICD-10-CM

## 2017-12-29 DIAGNOSIS — R2689 Other abnormalities of gait and mobility: Secondary | ICD-10-CM

## 2017-12-29 NOTE — Therapy (Signed)
[facility] [address] [place], [place], [address] Phone: [phone]   Fax:  [phone]  Occupational Therapy Treatment  Patient Details  Name: Anthony Allen MRN: [id number] Date of Birth: 11/19/1935 Referring Provider: Venancio Poisson   Encounter Date: 12/29/2017  OT End of Session - 12/29/17 1444    Visit Number  19    Number of Visits  24    Date for OT Re-Evaluation  01/22/18    Authorization Type  [employer] Medicare.  $20 copay each OT/PT    Authorization - Visit Number  19    Authorization - Number of Visits  20    OT Start Time  1400    OT Stop Time  1445    OT Time Calculation (min)  45 min    Activity Tolerance  Patient tolerated treatment well    Behavior During Therapy  [facility] for tasks assessed/performed       Past Medical History:  Diagnosis Date  . Arthritis    "mild in my hands" (06/04/2017)  . Complication of anesthesia    "was told never to use Propofol because of parent's adverse reaction" (06/04/2017)  . CVA (cerebral vascular accident) ([place])    hospitalized from 12/22-12/24 due to left-sided weakness, slurred speech and difficulty swallowing. He was diagnosed as left pontine stroke./notes 06/03/2017  . Family history of adverse reaction to anesthesia    "mother and dad had allergic reaction Propofol" (06/04/2017)  . Glaucoma, both eyes   . Gout   . Hypertension   . Parotid mass   . Type 2 diabetes mellitus with hyperglycemia [facility])     Past Surgical History:  Procedure Laterality Date  . TONSILLECTOMY  1943    There were no vitals filed for this visit.  Subjective Assessment - 12/29/17 1407    Subjective   I am being more careful.      Patient is accompained by:  Family member    Pertinent History  Pt with CVA in December of 2018.  Rehab at SNF then [facility].      Currently in Pain?  No/denies    Pain Score  0-No pain                   OT Treatments/Exercises (OP) -  12/29/17 0001      Neurological Re-education Exercises   Other Exercises 1  Neuromuscular reeducation to address forearm, wrist, and hand coordination in functional activities.  Handwriting, writing exercises with emphasis on coordination between wrist, forearm and hand versus shoulder and trunk substitutions.  Picking up coins, grooved pegboard with emphasis on manipulation with digits, and elbow motion.      Other Exercises 2  Locating pegs from putty with right hand only to address more sustained activity in right UE (Forced use)      Hand Gripper with Large Beads  Able to move 10 (1inch) blocks with gripper on lowest resistance.               OT Education - 12/29/17 1443    Education provided  Yes    Education Details  functional fine motor coordination for home activity program    Person(s) Educated  Patient    Methods  Explanation    Comprehension  Verbalized understanding       OT Short Term Goals - 11/20/17 1702      OT SHORT TERM GOAL #1   Title  Patient will tshirt with set up assistance  due 11/23/17    Time  4    Period  Weeks    Status  Achieved      OT SHORT TERM GOAL #2   Title  Patient will don socks with min assist    Time  4    Period  Weeks    Status  Achieved      OT SHORT TERM GOAL #3   Title  Patient will don pants with min assistance    Time  4    Period  Weeks    Status  Achieved      OT SHORT TERM GOAL #4   Title  Patient will complete HEP designed to improve shoulder range of motion with min assist and cueing    Time  4    Period  Weeks    Status  Achieved        OT Long Term Goals - 11/28/17 1636      OT LONG TERM GOAL #1   Title  Patient will dress himself with only occasional min assistance    Status  Achieved      OT LONG TERM GOAL #2   Title  Patient will transfer into and out of shower with supervision    Status  Achieved      OT LONG TERM GOAL #3   Title  Patient will feed himself, and cut up his own food with set up  assistance    Status  On-going      OT LONG TERM GOAL #4   Title  Patient will demonstrate at keast 5 degrees additional PIP flexion in digits 2-5 of right hand to aide in grasp and pinch use.      Status  Achieved      OT LONG TERM GOAL #5   Title  Patient will demonstrate active supination at least 10 degrees beyond neutral to aide in orienting hand to object needed for functional task    Status  On-going      OT LONG TERM GOAL #6   Title  Patient will demonstrate active low reach in right arm to obtain lightweight (1 lb or less)  item from countertop or tabletop if standing    Status  On-going      OT LONG TERM GOAL #7   Title  Patient will demonstrate at leaast 90 degrees of passive shoulder flexion or abduction with pain no greater than 2/10 to aide with hygiene and dressing tasks    Status  On-going            Plan - 12/29/17 1444    Clinical Impression Statement  Patient shows steady improvement toward OT goals    Occupational performance deficits (Please refer to evaluation for details):  ADL's;IADL's;Rest and Sleep;Leisure;Social Participation    Rehab Potential  Fair    Current Impairments/barriers affecting progress:  concern for RSD right UE     OT Frequency  2x / week    OT Duration  12 weeks    OT Treatment/Interventions  Self-care/ADL training;Electrical Stimulation;Therapeutic exercise;Visual/perceptual remediation/compensation;Patient/family education;Splinting;Neuromuscular education;Paraffin;Moist Heat;Aquatic Therapy;Traction;Fluidtherapy;Functional Mobility Training;Scar mobilization;Therapeutic activities;Balance training;Energy conservation;Cognitive remediation/compensation;Passive range of motion;Manual Therapy;DME and/or AE instruction;Contrast Bath;Ultrasound;Cryotherapy    Plan  shoulder manual therapy - body on arm - NMR RUE/Trunk, functional use of right UE while ambulating with cane,     Clinical Decision Making  Multiple treatment options,  significant modification of task necessary    OT Home Exercise Plan  Need to determine what has already  been established    Consulted and Agree with Plan of Care  Patient;Family member/caregiver    Family Member Consulted  wife - Anthony Allen       Patient will benefit from skilled therapeutic intervention in order to improve the following deficits and impairments:  Decreased cognition, Decreased knowledge of use of DME, Decreased skin integrity, Increased edema, Impaired flexibility, Impaired vision/preception, Pain, Abnormal gait, Cardiopulmonary status limiting activity, Decreased coordination, Decreased mobility, Increased fascial restrictions, Impaired sensation, Improper body mechanics, Decreased activity tolerance, Decreased endurance, Decreased range of motion, Decreased strength, Impaired tone, Improper spinal/pelvic alignment, Decreased balance, Decreased knowledge of precautions, Decreased safety awareness, Difficulty walking, Impaired perceived functional ability, Impaired UE functional use  Visit Diagnosis: Hemiplegia and hemiparesis following cerebral infarction affecting right dominant side (HCC)  Stiffness of right wrist, not elsewhere classified  Stiffness of right hand, not elsewhere classified  Stiffness of right shoulder, not elsewhere classified  Muscle weakness (generalized)  Visuospatial deficit  Cognitive social or emotional deficit following cerebral infarction  Abnormal posture  Unsteadiness on feet    Problem List Patient Active Problem List   Diagnosis Date Noted  . Weight loss, unintentional 11/02/2017  . Candidiasis of scrotum 11/02/2017  . Diastolic dysfunction   . Dysphagia, post-stroke   . CKD (chronic kidney disease), stage III ([place]) 06/03/2017  . 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 Stroke (cerebrum) ([place]) 06/03/2017  . CVA (cerebral vascular accident) ([place]) 06/01/2017  . Fatigue 05/08/2017  . Aortic atherosclerosis ([place]) 05/08/2017  .  Glaucoma 05/06/2017  . Lipoma of forehead 11/02/2016  . Type II diabetes mellitus with renal manifestations ([place]) 11/02/2016  . Hyperlipidemia LDL goal <100 11/02/2016  . Constipation in male 11/02/2016  . Insomnia due to anxiety and fear 11/02/2016  . Colon cancer screening 05/04/2015  . Vitamin D deficiency 05/02/2015  . Medicare annual wellness visit, subsequent 06/23/2013  . Essential hypertension, benign 03/22/2013  . Osteoarthritis 03/22/2013  . Numbness and tingling in left hand 03/22/2013  . GERD (gastroesophageal reflux disease) 03/22/2013  . BPH (benign prostatic hyperplasia) 03/22/2013    Mariah Milling , OTR/L 12/29/2017, 2:49 PM  [facility] [address] [place], [place], [address] Phone: [phone]   Fax:  [phone]  Name: Anthony Allen MRN: [id number] Date of Birth: Apr 24, 1936

## 2017-12-29 NOTE — Addendum Note (Signed)
Addended by: Cameron Sprang A on: 12/29/2017 09:06 PM   Modules accepted: Orders

## 2017-12-29 NOTE — Therapy (Addendum)
[facility] [address] [place], [place], [address] Phone: [phone]   Fax:  [phone]  Physical Therapy Treatment  Patient Details  Name: Anthony Allen MRN: [id number] Date of Birth: 31-Aug-1935 Referring Provider: Venancio Poisson   Encounter Date: 12/29/2017  PT End of Session - 12/29/17 1409    Visit Number  17    Number of Visits  17    Date for PT Re-Evaluation  12/23/17    Authorization Type  [employer] Medicare - 10th visit PN     PT Start Time  1315    PT Stop Time  1400    PT Time Calculation (min)  45 min    Equipment Utilized During Treatment  Gait belt    Activity Tolerance  Patient tolerated treatment well    Behavior During Therapy  [facility] for tasks assessed/performed       Past Medical History:  Diagnosis Date  . Arthritis    "mild in my hands" (06/04/2017)  . Complication of anesthesia    "was told never to use Propofol because of parent's adverse reaction" (06/04/2017)  . CVA (cerebral vascular accident) ([place])    hospitalized from 12/22-12/24 due to left-sided weakness, slurred speech and difficulty swallowing. He was diagnosed as left pontine stroke./notes 06/03/2017  . Family history of adverse reaction to anesthesia    "mother and dad had allergic reaction Propofol" (06/04/2017)  . Glaucoma, both eyes   . Gout   . Hypertension   . Parotid mass   . Type 2 diabetes mellitus with hyperglycemia [facility])     Past Surgical History:  Procedure Laterality Date  . TONSILLECTOMY  1943    There were no vitals filed for this visit.  Subjective Assessment - 12/29/17 1325    Subjective  Walking with RW and back legs of RW noted to be dragging on floor due to friction.  No issues or falls to report.    Patient is accompained by:  Family member    Pertinent History  glaucoma    Patient Stated Goals  "walk better."     Currently in Pain?  No/denies         [facility] PT Assessment - 12/29/17 1327       Assessment   Medical Diagnosis  L pontine CVA    Onset Date/Surgical Date  05/31/17    Hand Dominance  Right    Prior Therapy  acute, SNF, HHPT/OT/ST      Precautions   Precautions  Fall      Prior Function   Level of Independence  Independent with basic ADLs;Independent with gait    Vocation  Retired      Observation/Other Assessments   Focus on Therapeutic Outcomes (FOTO)   incomplete      Ambulation/Gait   Ambulation/Gait  Yes    Ambulation/Gait Assistance  4: Min guard;4: Min assist    Ambulation/Gait Assistance Details  intermittent assistance for balance due to catching R toe on ground; intermittent min facilitation at trunk and pelvis for upright posture and weight shifting    Ambulation Distance (Feet)  460 Feet    Assistive device  Straight cane    Gait Pattern  Step-through pattern;Decreased arm swing - right;Decreased dorsiflexion - right;Decreased weight shift to right;Right foot flat;Trunk flexed;Narrow base of support;Lateral trunk lean to left;Poor foot clearance - right    Ambulation Surface  Level;Indoor      Standardized Balance Assessment   Standardized Balance Assessment  Berg Balance  Test;Timed Up and Go Test;Five Times Sit to Stand    Five times sit to stand comments   13.79 no UE support      Berg Balance Test   Sit to Stand  Able to stand without using hands and stabilize independently    Standing Unsupported  Able to stand safely 2 minutes    Sitting with Back Unsupported but Feet Supported on Floor or Stool  Able to sit safely and securely 2 minutes    Stand to Sit  Sits safely with minimal use of hands    Transfers  Able to transfer safely, minor use of hands    Standing Unsupported with Eyes Closed  Able to stand 10 seconds safely    Standing Ubsupported with Feet Together  Able to place feet together independently and stand for 1 minute with supervision    From Standing, Reach Forward with Outstretched Arm  Can reach forward >12 cm safely (5")     From Standing Position, Pick up Object from Floor  Able to pick up shoe, needs supervision    From Standing Position, Turn to Look Behind Over each Shoulder  Looks behind from both sides and weight shifts well    Turn 360 Degrees  Needs close supervision or verbal cueing    Standing Unsupported, Alternately Place Feet on Step/Stool  Able to complete 4 steps without aid or supervision    Standing Unsupported, One Foot in Front  Able to plae foot ahead of the other independently and hold 30 seconds    Standing on One Leg  Able to lift leg independently and hold equal to or more than 3 seconds    Total Score  45    Berg comment:  45/56 significant risk for falls      Timed Up and Go Test   TUG  Normal TUG    Normal TUG (seconds)  17.81 with RW; with cane:    TUG Comments  21.28 with quad tip cane, supervision                           PT Education - 12/29/17 1403    Education provided  Yes    Education Details  progress towards goals, areas to continue to address with renewed visits, tennis balls placed on RW to decrease friction    Person(s) Educated  Patient    Methods  Explanation    Comprehension  Verbalized understanding       PT Short Term Goals - 12/01/17 2019      PT SHORT TERM GOAL #1   Title  Pt/wife will initiate HEP in order to indicate improved functional mobility (Target date: 11/23/17)    Time  4    Period  Weeks    Status  New      PT SHORT TERM GOAL #2   Title  Pt will improve TUG to </=33 secs w/ LRAD at S level in order to indicate decreased fall risk.      Baseline  21.94 secs with RW 11/20/17    Time  4    Period  Weeks    Status  Achieved      PT SHORT TERM GOAL #3   Title  Will assess BERG and improve score by 3 points from baseline in order to indicate decreased fall risk.     Baseline  41 at baseline, 39 on 11/20/17    Time  4  Period  Weeks    Status  Not Met      PT SHORT TERM GOAL #4   Title  Pt will improve 5TSS to </= 21  secs with single UE support only in order to indicate decreased fall risk and improved functional strength.      Baseline  11.81 secs with single UE support 11/20/17    Time  4    Period  Weeks    Status  Achieved      PT SHORT TERM GOAL #5   Title  Pt will improve gait speed to 1.94 ft/sec w/ LRAD at S level in order to indicate decreased fall risk.      Baseline  1.62 ft/sec when cued "walk your normal pace" and 2.77 ft/sec when cued to speed up slightly.  Feel that he is able to ambulate safely at a speed somewhere  between these two speeds.  11/20/17    Time  4    Period  Weeks    Status  Partially Met      PT SHORT TERM GOAL #6   Title  Pt will ambulate short household distances (up to 50') w/ quad cane at S level in order to indicate improved independence at home.     Baseline  requires intermittent min/guard (assessed with both SBQC and quad tip cane and recommend he continue with quad tip cane. )    Time  4    Period  Weeks    Status  Partially Met        PT Long Term Goals - 12/29/17 1326      PT LONG TERM GOAL #1   Title  Pt/wife will be independent with final HEP in order to indicate improved functional mobility and decreased fall risk. (Target Date: 12/23/17)    Time  8    Period  Weeks    Status  On-going      PT LONG TERM GOAL #2   Title  Pt will improve BERG balance score by 6 points from baseline in order to indicate decreased fall risk.     Baseline  41 > 45/56 improved by 4 points    Time  8    Period  Weeks    Status  Partially Met      PT LONG TERM GOAL #3   Title  Pt will improve TUG time to </=19 secs w/ LRAD at mod I level in order to indicate decreased fall risk.      Baseline  17 with RW, 21 with cane    Time  8    Period  Weeks    Status  Partially Met      PT LONG TERM GOAL #4   Title  Pt will improve 5TSS to </=15 secs without UE support in order to indicate improved functional strength and decreased fall risk.      Baseline  13.79 seconds  without UE support    Time  8    Period  Weeks    Status  Achieved      PT LONG TERM GOAL #5   Title  Pt will ambulate x 500' w/ LRAD over indoor surfaces at mod I level in order to indicate improved functional mobility.      Baseline  460' with supervision-min A over indoor surfaces    Time  8    Period  Weeks    Status  Partially Met      PT LONG TERM  GOAL #6   Title  Pt will ambulate x 300' over unlevel paved outdoor surfaces w/ LRAD at S level in order to indicate improved community negotiation.      Time  8    Period  Weeks    Status  On-going       New LTGs:  PT Long Term Goals - 12/29/17 1326      PT LONG TERM GOAL #1   Title  Pt/wife will be independent with final HEP in order to indicate improved functional mobility and decreased fall risk. (Target Date: 01/28/18)    Time  4    Period  Weeks    Status  On-going    Target Date  01/28/18      PT LONG TERM GOAL #2   Title  Pt will improve BERG balance score to 49/56 in order to indicate decreased fall risk.     Baseline  41 > 45/56 improved by 4 points    Time  4    Period  Weeks    Status  Revised      PT LONG TERM GOAL #3   Title  Pt will improve TUG time to </=18 secs w/ quad tip cane at mod I level in order to indicate decreased fall risk.      Baseline  17 with RW, 21 with cane    Time  8    Period  Weeks    Status  Revised      PT LONG TERM GOAL #4   Title  Pt will improve 5TSS to </=15 secs without UE support in order to indicate improved functional strength and decreased fall risk.      Baseline  13.79 seconds without UE support    Time  8    Period  Weeks    Status  Achieved      PT LONG TERM GOAL #5   Title  Pt will ambulate x 300' w/ LRAD over indoor surfaces at mod I level in order to indicate improved functional mobility.      Baseline  460' with supervision-min A over indoor surfaces    Time  4    Period  Weeks    Status  Revised      PT LONG TERM GOAL #6   Title  Pt will ambulate x 300'  over unlevel paved outdoor surfaces w/ LRAD at S level in order to indicate improved community negotiation.      Time  4    Period  Weeks    Status  On-going           Plan - 12/29/17 1415    Clinical Impression Statement  Treatment session focused on patient's progress towards LTG.  Pt is making steady progress and has met 1/6 LTG demonstrating improvement in LE functional strength to perform five time sit to stand without UE support.  Pt partially met 3 goals demonstrating improvements in static and dynamic standing balance, safety during gait with RW and cane and improved endurance for gait with cane over indoor surfaces for longer distances.  Pt continues to demonstrate decreased gait speed, decreased balance with pivoting/turning, more narrow BOS or during SLS with decreased UE support and increased R foot drag when ambulating with cane.  Pt will benefit from ongoing skilled PT services to continue to address impairments in attention to R side, R hemiparesis, impaired motor planning, impaired gait, impaired balance and training with Bioness functional electrical stimulation to maximize functional  mobility independence and decrease risk for falls.    Rehab Potential  Good    Clinical Impairments Affecting Rehab Potential  Pt very motivated     PT Frequency  2x / week    PT Duration  4 weeks but will recertify for 60 more days    PT Treatment/Interventions  ADLs/Self Care Home Management;Electrical Stimulation;DME Instruction;Gait training;Stair training;Functional mobility training;Therapeutic activities;Therapeutic exercise;Balance training;Neuromuscular re-education;Patient/family education;Orthotic Fit/Training;Passive range of motion;Vestibular    PT Next Visit Plan  Improved trunk/core activation to carryover to gait, Bioness each session (assist with paperwork for home unit)(R quad and R lower leg), R LE NMR (hip and knee extension, R DF), go over HEP intermittently, gait with quad tip  cane, stairs with cane and no rails (to get into man cave)    Consulted and Agree with Plan of Care  Patient;Family member/caregiver    Family Member Consulted  wife Baldwin [place] (goes by [employer])       Patient will benefit from skilled therapeutic intervention in order to improve the following deficits and impairments:  Abnormal gait, Decreased activity tolerance, Decreased balance, Decreased coordination, Decreased endurance, Decreased knowledge of use of DME, Decreased mobility, Decreased range of motion, Decreased strength, Impaired perceived functional ability, Impaired flexibility, Improper body mechanics, Postural dysfunction, Impaired UE functional use, Difficulty walking  Visit Diagnosis: Other abnormalities of gait and mobility  Unsteadiness on feet  Abnormal posture  Muscle weakness (generalized)  Hemiplegia and hemiparesis following cerebral infarction affecting right dominant side [facility])     Problem List Patient Active Problem List   Diagnosis Date Noted  . Weight loss, unintentional 11/02/2017  . Candidiasis of scrotum 11/02/2017  . Diastolic dysfunction   . Dysphagia, post-stroke   . CKD (chronic kidney disease), stage III ([place]) 06/03/2017  . 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 Stroke (cerebrum) ([place]) 06/03/2017  . CVA (cerebral vascular accident) ([place]) 06/01/2017  . Fatigue 05/08/2017  . Aortic atherosclerosis ([place]) 05/08/2017  . Glaucoma 05/06/2017  . Lipoma of forehead 11/02/2016  . Type II diabetes mellitus with renal manifestations ([place]) 11/02/2016  . Hyperlipidemia LDL goal <100 11/02/2016  . Constipation in male 11/02/2016  . Insomnia due to anxiety and fear 11/02/2016  . Colon cancer screening 05/04/2015  . Vitamin D deficiency 05/02/2015  . Medicare annual wellness visit, subsequent 06/23/2013  . Essential hypertension, benign 03/22/2013  . Osteoarthritis 03/22/2013  . Numbness and tingling in left hand 03/22/2013  . GERD (gastroesophageal reflux  disease) 03/22/2013  . BPH (benign prostatic hyperplasia) 03/22/2013    Rico Junker, PT, DPT 12/29/17    2:22 PM    [facility] [address] [place], [place], [address] Phone: [phone]   Fax:  [phone]  Name: Anthony Allen MRN: [id number] Date of Birth: [birth year]

## 2018-01-01 ENCOUNTER — Ambulatory Visit: Payer: Medicare Other | Admitting: Rehabilitation

## 2018-01-01 ENCOUNTER — Ambulatory Visit: Payer: Medicare Other | Admitting: Occupational Therapy

## 2018-01-01 ENCOUNTER — Encounter: Payer: Self-pay | Admitting: Occupational Therapy

## 2018-01-01 ENCOUNTER — Encounter: Payer: Self-pay | Admitting: Rehabilitation

## 2018-01-01 DIAGNOSIS — R41842 Visuospatial deficit: Secondary | ICD-10-CM

## 2018-01-01 DIAGNOSIS — R2689 Other abnormalities of gait and mobility: Secondary | ICD-10-CM

## 2018-01-01 DIAGNOSIS — M25641 Stiffness of right hand, not elsewhere classified: Secondary | ICD-10-CM

## 2018-01-01 DIAGNOSIS — M6281 Muscle weakness (generalized): Secondary | ICD-10-CM

## 2018-01-01 DIAGNOSIS — R2681 Unsteadiness on feet: Secondary | ICD-10-CM

## 2018-01-01 DIAGNOSIS — I69351 Hemiplegia and hemiparesis following cerebral infarction affecting right dominant side: Secondary | ICD-10-CM | POA: Diagnosis not present

## 2018-01-01 DIAGNOSIS — I69315 Cognitive social or emotional deficit following cerebral infarction: Secondary | ICD-10-CM

## 2018-01-01 DIAGNOSIS — M25611 Stiffness of right shoulder, not elsewhere classified: Secondary | ICD-10-CM

## 2018-01-01 DIAGNOSIS — R293 Abnormal posture: Secondary | ICD-10-CM

## 2018-01-01 DIAGNOSIS — M25631 Stiffness of right wrist, not elsewhere classified: Secondary | ICD-10-CM

## 2018-01-01 NOTE — Therapy (Signed)
[facility] [address] [place], [place], [address] Phone: [phone]   Fax:  [phone]  Occupational Therapy Treatment  Patient Details  Name: Anthony Allen MRN: [id number] Date of Birth: 17-Feb-1936 Referring Provider: Venancio Poisson   Encounter Date: 01/01/2018  OT End of Session - 01/01/18 1555    Visit Number  20    Number of Visits  24    Date for OT Re-Evaluation  01/22/18    Authorization Type  [employer] Medicare.  $20 copay each OT/PT    Authorization - Visit Number  20    Authorization - Number of Visits  20    OT Start Time  1401    OT Stop Time  1445    OT Time Calculation (min)  44 min    Activity Tolerance  Patient tolerated treatment well    Behavior During Therapy  [facility] for tasks assessed/performed       Past Medical History:  Diagnosis Date  . Arthritis    "mild in my hands" (06/04/2017)  . Complication of anesthesia    "was told never to use Propofol because of parent's adverse reaction" (06/04/2017)  . CVA (cerebral vascular accident) ([place])    hospitalized from 12/22-12/24 due to left-sided weakness, slurred speech and difficulty swallowing. He was diagnosed as left pontine stroke./notes 06/03/2017  . Family history of adverse reaction to anesthesia    "mother and dad had allergic reaction Propofol" (06/04/2017)  . Glaucoma, both eyes   . Gout   . Hypertension   . Parotid mass   . Type 2 diabetes mellitus with hyperglycemia [facility])     Past Surgical History:  Procedure Laterality Date  . TONSILLECTOMY  1943    There were no vitals filed for this visit.  Subjective Assessment - 01/01/18 1407    Subjective   No pain    Patient is accompained by:  Family member    Pertinent History  Pt with CVA in December of 2018.  Rehab at SNF then [facility].      Currently in Pain?  No/denies    Pain Score  0-No pain                   OT Treatments/Exercises (OP) - 01/01/18 0001      Manual Therapy   Manual Therapy  Scapular mobilization;Joint mobilization;Soft tissue mobilization    Manual therapy comments  Working to improve scapula humeral rhythm.  Patient with limited separation between humerus and scaula - limiting glenohumeral joint motion.      Joint Mobilization  Responded well to consistent, gentle traction to create joint space for [facility] joint motion    Soft tissue mobilization  Working to increase length of axillary/scap tissue to improve shoulder range of motion right               OT Short Term Goals - 01/01/18 1557      OT SHORT TERM GOAL #1   Title  Patient will tshirt with set up assistance due 11/23/17    Status  Achieved      OT SHORT TERM GOAL #2   Title  Patient will don socks with min assist    Status  Achieved      OT SHORT TERM GOAL #3   Title  Patient will don pants with min assistance    Status  Achieved      OT SHORT TERM GOAL #4   Title  Patient will complete  HEP designed to improve shoulder range of motion with min assist and cueing    Status  Achieved        OT Long Term Goals - 01/01/18 1557      OT LONG TERM GOAL #1   Title  Patient will dress himself with only occasional min assistance    Status  Achieved      OT LONG TERM GOAL #2   Title  Patient will transfer into and out of shower with supervision    Status  Achieved      OT LONG TERM GOAL #3   Title  Patient will feed himself, and cut up his own food with set up assistance    Status  On-going      OT LONG TERM GOAL #4   Title  Patient will demonstrate at keast 5 degrees additional PIP flexion in digits 2-5 of right hand to aide in grasp and pinch use.      Status  Achieved      OT LONG TERM GOAL #5   Title  Patient will demonstrate active supination at least 10 degrees beyond neutral to aide in orienting hand to object needed for functional task    Status  On-going      OT LONG TERM GOAL #6   Title  Patient will demonstrate active low reach in right arm  to obtain lightweight (1 lb or less)  item from countertop or tabletop if standing    Status  On-going      OT LONG TERM GOAL #7   Title  Patient will demonstrate at leaast 90 degrees of passive shoulder flexion or abduction with pain no greater than 2/10 to aide with hygiene and dressing tasks    Status  Achieved            Plan - 01/01/18 1556    Clinical Impression Statement  Patient is showing steady improvement in all aspects of ADL due to improved functional mobility, improved activity tolerance, balance, and functional use of right UE.      Occupational Profile and client history currently impacting functional performance  Husband, father - son with significant psychiatric dx- currently stable on medication, retired Quarry manager, Financial trader, Geophysicist/field seismologist, and Insurance claims handler performance deficits (Please refer to evaluation for details):  ADL's;IADL's;Rest and Sleep;Leisure;Social Participation    Rehab Potential  Fair    Current Impairments/barriers affecting progress:  concern for RSD right UE     OT Frequency  2x / week    OT Duration  12 weeks    OT Treatment/Interventions  Self-care/ADL training;Electrical Stimulation;Therapeutic exercise;Visual/perceptual remediation/compensation;Patient/family education;Splinting;Neuromuscular education;Paraffin;Moist Heat;Aquatic Therapy;Traction;Fluidtherapy;Functional Mobility Training;Scar mobilization;Therapeutic activities;Balance training;Energy conservation;Cognitive remediation/compensation;Passive range of motion;Manual Therapy;DME and/or AE instruction;Contrast Bath;Ultrasound;Cryotherapy    Plan  shoulder manual therapy - body on arm - NMR RUE/Trunk, functional use of right UE while ambulating with cane,     Clinical Decision Making  Multiple treatment options, significant modification of task necessary    Consulted and Agree with Plan of Care  Patient;Family member/caregiver    Family Member Consulted  wife -  [name]       Patient will benefit from skilled therapeutic intervention in order to improve the following deficits and impairments:  Decreased cognition, Decreased knowledge of use of DME, Decreased skin integrity, Increased edema, Impaired flexibility, Impaired vision/preception, Pain, Abnormal gait, Cardiopulmonary status limiting activity, Decreased coordination, Decreased mobility, Increased fascial restrictions, Impaired sensation, Improper body mechanics, Decreased activity tolerance, Decreased endurance, Decreased  range of motion, Decreased strength, Impaired tone, Improper spinal/pelvic alignment, Decreased balance, Decreased knowledge of precautions, Decreased safety awareness, Difficulty walking, Impaired perceived functional ability, Impaired UE functional use  Visit Diagnosis: Unsteadiness on feet  Abnormal posture  Muscle weakness (generalized)  Hemiplegia and hemiparesis following cerebral infarction affecting right dominant side (HCC)  Stiffness of right wrist, not elsewhere classified  Stiffness of right hand, not elsewhere classified  Stiffness of right shoulder, not elsewhere classified  Visuospatial deficit  Cognitive social or emotional deficit following cerebral infarction    Problem List Patient Active Problem List   Diagnosis Date Noted  . Weight loss, unintentional 11/02/2017  . Candidiasis of scrotum 11/02/2017  . Diastolic dysfunction   . Dysphagia, post-stroke   . CKD (chronic kidney disease), stage III ([place]) 06/03/2017  . 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 Stroke (cerebrum) ([place]) 06/03/2017  . CVA (cerebral vascular accident) ([place]) 06/01/2017  . Fatigue 05/08/2017  . Aortic atherosclerosis ([place]) 05/08/2017  . Glaucoma 05/06/2017  . Lipoma of forehead 11/02/2016  . Type II diabetes mellitus with renal manifestations ([place]) 11/02/2016  . Hyperlipidemia LDL goal <100 11/02/2016  . Constipation in male 11/02/2016  . Insomnia due to anxiety and  fear 11/02/2016  . Colon cancer screening 05/04/2015  . Vitamin D deficiency 05/02/2015  . Medicare annual wellness visit, subsequent 06/23/2013  . Essential hypertension, benign 03/22/2013  . Osteoarthritis 03/22/2013  . Numbness and tingling in left hand 03/22/2013  . GERD (gastroesophageal reflux disease) 03/22/2013  . BPH (benign prostatic hyperplasia) 03/22/2013   Occupational Therapy Progress Note  Dates of Reporting Period: 12/01/2017 to 01/01/2018   Mariah Milling, OTR/L 01/01/2018, 4:00 PM  [facility] [address] [place], [place], [address] Phone: [phone]   Fax:  [phone]  Name: Anthony Allen MRN: [id number] Date of Birth: 19-Sep-1935

## 2018-01-01 NOTE — Therapy (Signed)
[facility] [address] [place], [place], [address] Phone: [phone]   Fax:  [phone]  Physical Therapy Treatment  Patient Details  Name: Anthony Allen MRN: [id number] Date of Birth: [birth year] Referring Provider: Venancio Poisson   Encounter Date: 01/01/2018  PT End of Session - 01/01/18 1725    Visit Number  18    Number of Visits  25    Date for PT Re-Evaluation  78/58/85 recertified for 60 days but anticipate D/C at 4 wks    Authorization Type  [employer] Medicare - 10th visit PN     PT Start Time  1540    PT Stop Time  1625    PT Time Calculation (min)  45 min    Activity Tolerance  Patient tolerated treatment well    Behavior During Therapy  [facility] for tasks assessed/performed       Past Medical History:  Diagnosis Date  . Arthritis    "mild in my hands" (06/04/2017)  . Complication of anesthesia    "was told never to use Propofol because of parent's adverse reaction" (06/04/2017)  . CVA (cerebral vascular accident) ([place])    hospitalized from 12/22-12/24 due to left-sided weakness, slurred speech and difficulty swallowing. He was diagnosed as left pontine stroke./notes 06/03/2017  . Family history of adverse reaction to anesthesia    "mother and dad had allergic reaction Propofol" (06/04/2017)  . Glaucoma, both eyes   . Gout   . Hypertension   . Parotid mass   . Type 2 diabetes mellitus with hyperglycemia [facility])     Past Surgical History:  Procedure Laterality Date  . TONSILLECTOMY  1943    There were no vitals filed for this visit.  Subjective Assessment - 01/01/18 1716    Subjective  Pt reports wanting to pursue Bioness for home.     Patient is accompained by:  Family member    Pertinent History  glaucoma    Limitations  House hold activities;Walking;Standing    Patient Stated Goals  "walk better."     Currently in Pain?  No/denies                       [facility] Adult PT  Treatment/Exercise - 01/01/18 1545      Ambulation/Gait   Ambulation/Gait  Yes    Ambulation/Gait Assistance  4: Min guard;4: Min assist    Ambulation/Gait Assistance Details  Utilized Bioness on R lower leg for DF assist and R thigh for improved knee extension in stance phase of gait.  Performed all gait without AD in order to continue to address postural control, improved R hip extension and improved forward lateral weight shift over RLE.   Note that pt had decreased clearance of RLE during first bout of gait, therefore increased stim.  Also note continued R knee flex during stance and increased stim to thigh as well.  Pt tolerated well.      Ambulation Distance (Feet)  345 Feet    Assistive device  None    Gait Pattern  Step-through pattern;Decreased arm swing - right;Decreased dorsiflexion - right;Decreased weight shift to right;Right foot flat;Trunk flexed;Narrow base of support;Lateral trunk lean to left;Poor foot clearance - right    Ambulation Surface  Level;Indoor    Stairs  Yes    Stairs Assistance  5: Supervision;4: Min guard    Stairs Assistance Details (indicate cue type and reason)  Utilized Bioness during stair training for improved R knee  extension when leading with RLE and improved control in RLE when descending stairs.  Pt able to utililize BUE support on rail, but needs cues for increased R knee extension.  Performed stairs x 3 reps.     Stair Management Technique  Two rails;Alternating pattern;Forwards    Number of Stairs  12    Height of Stairs  6    Ramp  4: Min assist    Ramp Details (indicate cue type and reason)  Cues for posture and technique    Curb  4: Min assist    Curb Details (indicate cue type and reason)  Performed curb step x 3 reps without AD but with Bioness in order to continue to address R LE stance control and ability to increase DF when clearing step.  Pt initially keeping R knee flexed with difficulty ascending with RLE, however with verbal and tactile cues  was able to correct.       Neuro Re-ed    Neuro Re-ed Details   NMR with Bioness for improved postural control, improved R proximal LE activation in stance and forced use of RLE.  Performed tall kneeling squats x 10 reps without UEsupport but with PT providing visual cue for keeping his R hip on PTs hip for improved R lateral weight shift during transitions, transitions from tall kneel to R half kneel with light UE support from PT with cues for increased R hip extension.  Performed side stepping in tall kneeling (without stim) x 2 laps on red mat and min A, forwards/backwards gait in tall kneeling (without stim) and min A from PT and cues for improved B hip extension and forward gaze with relaxed shoulders.  Ended session with step ups/downs forwards and backwards on aerobic step x 10 reps with use of Bioness.  Cues for improved R knee extension with good carryover with repetition.       Modalities   Modalities  Teacher, English as a foreign language Location  R lower leg and R thigh    Electrical Stimulation Action  R ankle DF and R knee extension    Electrical Stimulation Parameters  See tablet 1, quick fit electrode    Electrical Stimulation Goals  Neuromuscular facilitation             PT Education - 01/01/18 1724    Education provided  Yes    Education Details  Progress with PT and whether or not we would renew following 4 week POC.     Person(s) Educated  Patient;Spouse    Methods  Explanation    Comprehension  Verbalized understanding       PT Short Term Goals - 12/01/17 2019      PT SHORT TERM GOAL #1   Title  Pt/wife will initiate HEP in order to indicate improved functional mobility (Target date: 11/23/17)    Time  4    Period  Weeks    Status  New      PT SHORT TERM GOAL #2   Title  Pt will improve TUG to </=33 secs w/ LRAD at S level in order to indicate decreased fall risk.      Baseline  21.94 secs with RW 11/20/17    Time  4     Period  Weeks    Status  Achieved      PT SHORT TERM GOAL #3   Title  Will assess BERG and improve score by 3 points from  baseline in order to indicate decreased fall risk.     Baseline  41 at baseline, 39 on 11/20/17    Time  4    Period  Weeks    Status  Not Met      PT SHORT TERM GOAL #4   Title  Pt will improve 5TSS to </= 21 secs with single UE support only in order to indicate decreased fall risk and improved functional strength.      Baseline  11.81 secs with single UE support 11/20/17    Time  4    Period  Weeks    Status  Achieved      PT SHORT TERM GOAL #5   Title  Pt will improve gait speed to 1.94 ft/sec w/ LRAD at S level in order to indicate decreased fall risk.      Baseline  1.62 ft/sec when cued "walk your normal pace" and 2.77 ft/sec when cued to speed up slightly.  Feel that he is able to ambulate safely at a speed somewhere  between these two speeds.  11/20/17    Time  4    Period  Weeks    Status  Partially Met      PT SHORT TERM GOAL #6   Title  Pt will ambulate short household distances (up to 50') w/ quad cane at S level in order to indicate improved independence at home.     Baseline  requires intermittent min/guard (assessed with both SBQC and quad tip cane and recommend he continue with quad tip cane. )    Time  4    Period  Weeks    Status  Partially Met        PT Long Term Goals - 12/29/17 1326      PT LONG TERM GOAL #1   Title  Pt/wife will be independent with final HEP in order to indicate improved functional mobility and decreased fall risk. (Target Date: 01/28/18)    Time  4    Period  Weeks    Status  On-going    Target Date  01/28/18      PT LONG TERM GOAL #2   Title  Pt will improve BERG balance score to 49/56 in order to indicate decreased fall risk.     Baseline  41 > 45/56 improved by 4 points    Time  4    Period  Weeks    Status  Revised      PT LONG TERM GOAL #3   Title  Pt will improve TUG time to </=18 secs w/ quad tip cane  at mod I level in order to indicate decreased fall risk.      Baseline  17 with RW, 21 with cane    Time  8    Period  Weeks    Status  Revised      PT LONG TERM GOAL #4   Title  Pt will improve 5TSS to </=15 secs without UE support in order to indicate improved functional strength and decreased fall risk.      Baseline  13.79 seconds without UE support    Time  8    Period  Weeks    Status  Achieved      PT LONG TERM GOAL #5   Title  Pt will ambulate x 300' w/ LRAD over indoor surfaces at mod I level in order to indicate improved functional mobility.      Baseline  460' with  supervision-min A over indoor surfaces    Time  4    Period  Weeks    Status  Revised      PT LONG TERM GOAL #6   Title  Pt will ambulate x 300' over unlevel paved outdoor surfaces w/ LRAD at S level in order to indicate improved community negotiation.      Time  4    Period  Weeks    Status  On-going            Plan - 01/01/18 1725    Clinical Impression Statement  Skilled session continued to utilize Bioness for RLE (for DF assist and R knee extension) during gait and NMR tasks. Pt making good progress with use of Bioness, however when stimulation removed continue to note R toe drag when leaving clinic.  Will continue to pursue home unit.     Rehab Potential  Good    Clinical Impairments Affecting Rehab Potential  Pt very motivated     PT Frequency  2x / week    PT Duration  4 weeks but will recertify for 60 more days    PT Treatment/Interventions  ADLs/Self Care Home Management;Electrical Stimulation;DME Instruction;Gait training;Stair training;Functional mobility training;Therapeutic activities;Therapeutic exercise;Balance training;Neuromuscular re-education;Patient/family education;Orthotic Fit/Training;Passive range of motion;Vestibular    PT Next Visit Plan  Improved trunk/core activation to carryover to gait, Bioness each session (assist with paperwork for home unit)(R quad and R lower leg), R LE  NMR (hip and knee extension, R DF), go over HEP intermittently, gait with quad tip cane, stairs with cane and no rails (to get into man cave)    Consulted and Agree with Plan of Care  Patient;Family member/caregiver    Family Member Consulted  wife Baldwin [place] (goes by [employer])       Patient will benefit from skilled therapeutic intervention in order to improve the following deficits and impairments:  Abnormal gait, Decreased activity tolerance, Decreased balance, Decreased coordination, Decreased endurance, Decreased knowledge of use of DME, Decreased mobility, Decreased range of motion, Decreased strength, Impaired perceived functional ability, Impaired flexibility, Improper body mechanics, Postural dysfunction, Impaired UE functional use, Difficulty walking  Visit Diagnosis: Unsteadiness on feet  Abnormal posture  Muscle weakness (generalized)  Hemiplegia and hemiparesis following cerebral infarction affecting right dominant side (HCC)  Other abnormalities of gait and mobility     Problem List Patient Active Problem List   Diagnosis Date Noted  . Weight loss, unintentional 11/02/2017  . Candidiasis of scrotum 11/02/2017  . Diastolic dysfunction   . Dysphagia, post-stroke   . CKD (chronic kidney disease), stage III ([place]) 06/03/2017  . 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 Stroke (cerebrum) ([place]) 06/03/2017  . CVA (cerebral vascular accident) ([place]) 06/01/2017  . Fatigue 05/08/2017  . Aortic atherosclerosis ([place]) 05/08/2017  . Glaucoma 05/06/2017  . Lipoma of forehead 11/02/2016  . Type II diabetes mellitus with renal manifestations ([place]) 11/02/2016  . Hyperlipidemia LDL goal <100 11/02/2016  . Constipation in male 11/02/2016  . Insomnia due to anxiety and fear 11/02/2016  . Colon cancer screening 05/04/2015  . Vitamin D deficiency 05/02/2015  . Medicare annual wellness visit, subsequent 06/23/2013  . Essential hypertension, benign 03/22/2013  . Osteoarthritis 03/22/2013  .  Numbness and tingling in left hand 03/22/2013  . GERD (gastroesophageal reflux disease) 03/22/2013  . BPH (benign prostatic hyperplasia) 03/22/2013    Cameron Sprang, PT, MPT [facility] [address] [place], [place], [address] Phone: [phone]   Fax:  [phone] 01/01/18, 5:27 PM  Name: Anthony Allen MRN: [id number] Date of Birth: 1935/07/31

## 2018-01-05 ENCOUNTER — Ambulatory Visit: Payer: Medicare Other | Admitting: Occupational Therapy

## 2018-01-05 ENCOUNTER — Ambulatory Visit: Payer: Medicare Other | Admitting: Physical Therapy

## 2018-01-05 ENCOUNTER — Encounter: Payer: Self-pay | Admitting: Physical Therapy

## 2018-01-05 ENCOUNTER — Encounter: Payer: Self-pay | Admitting: Occupational Therapy

## 2018-01-05 DIAGNOSIS — M25631 Stiffness of right wrist, not elsewhere classified: Secondary | ICD-10-CM

## 2018-01-05 DIAGNOSIS — M25611 Stiffness of right shoulder, not elsewhere classified: Secondary | ICD-10-CM

## 2018-01-05 DIAGNOSIS — R2681 Unsteadiness on feet: Secondary | ICD-10-CM

## 2018-01-05 DIAGNOSIS — R293 Abnormal posture: Secondary | ICD-10-CM

## 2018-01-05 DIAGNOSIS — M6281 Muscle weakness (generalized): Secondary | ICD-10-CM

## 2018-01-05 DIAGNOSIS — I69315 Cognitive social or emotional deficit following cerebral infarction: Secondary | ICD-10-CM

## 2018-01-05 DIAGNOSIS — I69351 Hemiplegia and hemiparesis following cerebral infarction affecting right dominant side: Secondary | ICD-10-CM | POA: Diagnosis not present

## 2018-01-05 DIAGNOSIS — M25641 Stiffness of right hand, not elsewhere classified: Secondary | ICD-10-CM

## 2018-01-05 DIAGNOSIS — R41842 Visuospatial deficit: Secondary | ICD-10-CM

## 2018-01-05 DIAGNOSIS — R2689 Other abnormalities of gait and mobility: Secondary | ICD-10-CM

## 2018-01-05 NOTE — Therapy (Signed)
[facility] [address] [place], [place], [address] Phone: [phone]   Fax:  [phone]  Physical Therapy Treatment  Patient Details  Name: Anthony Allen MRN: [id number] Date of Birth: May 09, 1936 Referring Provider: Venancio Poisson   Encounter Date: 01/05/2018  PT End of Session - 01/05/18 1902    Visit Number  19    Number of Visits  25    Date for PT Re-Evaluation  20/25/42 recertified for 60 days but anticipate D/C at 4 wks    Authorization Type  [employer] Medicare - 10th visit PN     PT Start Time  1322 late arrival    PT Stop Time  1400    PT Time Calculation (min)  38 min    Activity Tolerance  Patient tolerated treatment well    Behavior During Therapy  [facility] for tasks assessed/performed       Past Medical History:  Diagnosis Date  . Arthritis    "mild in my hands" (06/04/2017)  . Complication of anesthesia    "was told never to use Propofol because of parent's adverse reaction" (06/04/2017)  . CVA (cerebral vascular accident) ([place])    hospitalized from 12/22-12/24 due to left-sided weakness, slurred speech and difficulty swallowing. He was diagnosed as left pontine stroke./notes 06/03/2017  . Family history of adverse reaction to anesthesia    "mother and dad had allergic reaction Propofol" (06/04/2017)  . Glaucoma, both eyes   . Gout   . Hypertension   . Parotid mass   . Type 2 diabetes mellitus with hyperglycemia [facility])     Past Surgical History:  Procedure Laterality Date  . TONSILLECTOMY  1943    There were no vitals filed for this visit.  Subjective Assessment - 01/05/18 1324    Subjective  Patient repports working with cane at home if someone is holding onto him.     Patient is accompained by:  Family member    Pertinent History  glaucoma    Limitations  House hold activities;Walking;Standing    Patient Stated Goals  "walk better."     Currently in Pain?  No/denies                        [facility] Adult PT Treatment/Exercise - 01/05/18 1351      Ambulation/Gait   Ambulation/Gait Assistance  4: Min guard    Ambulation/Gait Assistance Details  for safety with cane    Ambulation Distance (Feet)  120 Feet x2; 100    Assistive device  Straight cane    Gait Pattern  Step-through pattern;Decreased arm swing - right;Decreased dorsiflexion - right;Decreased weight shift to right;Trunk flexed;Narrow base of support;Lateral trunk lean to left;Poor foot clearance - right;Right foot flat    Ambulation Surface  Indoor    Stairs  Yes    Stairs Assistance  5: Supervision;4: Min guard    Stairs Assistance Details (indicate cue type and reason)  no bioness due to late start    Stair Management Technique  No rails;One rail Left;Two rails;Alternating pattern;Step to pattern;Forwards;With cane quad rubber tip    Number of Stairs  16    Height of Stairs  6      Knee/Hip Exercises: Seated   Sit to Sand  1 set;10 reps;with UE support lt foot on 2" block; hands on knees; ball between knees      Knee/Hip Exercises: Supine   Bridges  Strengthening;Right;Both;2 sets;10 reps single leg no hold;  bil LEs 5 sec hold    Bridges with [employer]  Strengthening;Both;2 sets;10 reps;20 reps 2nd set extend 1 leg once hips elevated      Knee/Hip Exercises: Sidelying   Hip ABduction  Strengthening;Both;1 set;10 reps manually resisted    Clams  bil LEs; manually resisted and support to keep hip forward               PT Short Term Goals - 12/01/17 2019      PT SHORT TERM GOAL #1   Title  Pt/wife will initiate HEP in order to indicate improved functional mobility (Target date: 11/23/17)    Time  4    Period  Weeks    Status  New      PT SHORT TERM GOAL #2   Title  Pt will improve TUG to </=33 secs w/ LRAD at S level in order to indicate decreased fall risk.      Baseline  21.94 secs with RW 11/20/17    Time  4    Period  Weeks    Status  Achieved      PT  SHORT TERM GOAL #3   Title  Will assess BERG and improve score by 3 points from baseline in order to indicate decreased fall risk.     Baseline  41 at baseline, 39 on 11/20/17    Time  4    Period  Weeks    Status  Not Met      PT SHORT TERM GOAL #4   Title  Pt will improve 5TSS to </= 21 secs with single UE support only in order to indicate decreased fall risk and improved functional strength.      Baseline  11.81 secs with single UE support 11/20/17    Time  4    Period  Weeks    Status  Achieved      PT SHORT TERM GOAL #5   Title  Pt will improve gait speed to 1.94 ft/sec w/ LRAD at S level in order to indicate decreased fall risk.      Baseline  1.62 ft/sec when cued "walk your normal pace" and 2.77 ft/sec when cued to speed up slightly.  Feel that he is able to ambulate safely at a speed somewhere  between these two speeds.  11/20/17    Time  4    Period  Weeks    Status  Partially Met      PT SHORT TERM GOAL #6   Title  Pt will ambulate short household distances (up to 50') w/ quad cane at S level in order to indicate improved independence at home.     Baseline  requires intermittent min/guard (assessed with both SBQC and quad tip cane and recommend he continue with quad tip cane. )    Time  4    Period  Weeks    Status  Partially Met        PT Long Term Goals - 12/29/17 1326      PT LONG TERM GOAL #1   Title  Pt/wife will be independent with final HEP in order to indicate improved functional mobility and decreased fall risk. (Target Date: 01/28/18)    Time  4    Period  Weeks    Status  On-going    Target Date  01/28/18      PT LONG TERM GOAL #2   Title  Pt will improve BERG balance score to 49/56 in order to  indicate decreased fall risk.     Baseline  41 > 45/56 improved by 4 points    Time  4    Period  Weeks    Status  Revised      PT LONG TERM GOAL #3   Title  Pt will improve TUG time to </=18 secs w/ quad tip cane at mod I level in order to indicate decreased  fall risk.      Baseline  17 with RW, 21 with cane    Time  8    Period  Weeks    Status  Revised      PT LONG TERM GOAL #4   Title  Pt will improve 5TSS to </=15 secs without UE support in order to indicate improved functional strength and decreased fall risk.      Baseline  13.79 seconds without UE support    Time  8    Period  Weeks    Status  Achieved      PT LONG TERM GOAL #5   Title  Pt will ambulate x 300' w/ LRAD over indoor surfaces at mod I level in order to indicate improved functional mobility.      Baseline  460' with supervision-min A over indoor surfaces    Time  4    Period  Weeks    Status  Revised      PT LONG TERM GOAL #6   Title  Pt will ambulate x 300' over unlevel paved outdoor surfaces w/ LRAD at S level in order to indicate improved community negotiation.      Time  4    Period  Weeks    Status  On-going            Plan - 01/05/18 1909    Clinical Impression Statement  Patient arrived late, therefore Bioness use deferred. Focus on gait training with SPC with quad rubber tip, including stair training, and bil LE strengthening for improved transfers and gait. Patient motivated and able to follow instructions to improve his technique to improve his gait efficiency.     Rehab Potential  Good    Clinical Impairments Affecting Rehab Potential  Pt very motivated     PT Frequency  2x / week    PT Duration  4 weeks but will recertify for 60 more days    PT Treatment/Interventions  ADLs/Self Care Home Management;Electrical Stimulation;DME Instruction;Gait training;Stair training;Functional mobility training;Therapeutic activities;Therapeutic exercise;Balance training;Neuromuscular re-education;Patient/family education;Orthotic Fit/Training;Passive range of motion;Vestibular    PT Next Visit Plan  Improved trunk/core activation to carryover to gait, Bioness each session (assist with paperwork for home unit)(R quad and R lower leg), R LE NMR (hip and knee  extension, R DF), go over HEP intermittently, gait with quad tip cane, stairs with cane and no rails (to get into man cave)    Consulted and Agree with Plan of Care  Patient;Family member/caregiver    Family Member Consulted  wife Baldwin [place] (goes by [employer])       Patient will benefit from skilled therapeutic intervention in order to improve the following deficits and impairments:  Abnormal gait, Decreased activity tolerance, Decreased balance, Decreased coordination, Decreased endurance, Decreased knowledge of use of DME, Decreased mobility, Decreased range of motion, Decreased strength, Impaired perceived functional ability, Impaired flexibility, Improper body mechanics, Postural dysfunction, Impaired UE functional use, Difficulty walking  Visit Diagnosis: Hemiplegia and hemiparesis following cerebral infarction affecting right dominant side (HCC)  Muscle weakness (generalized)  Unsteadiness on feet  Other abnormalities of gait and mobility     Problem List Patient Active Problem List   Diagnosis Date Noted  . Weight loss, unintentional 11/02/2017  . Candidiasis of scrotum 11/02/2017  . Diastolic dysfunction   . Dysphagia, post-stroke   . CKD (chronic kidney disease), stage III ([place]) 06/03/2017  . 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 Stroke (cerebrum) ([place]) 06/03/2017  . CVA (cerebral vascular accident) ([place]) 06/01/2017  . Fatigue 05/08/2017  . Aortic atherosclerosis ([place]) 05/08/2017  . Glaucoma 05/06/2017  . Lipoma of forehead 11/02/2016  . Type II diabetes mellitus with renal manifestations ([place]) 11/02/2016  . Hyperlipidemia LDL goal <100 11/02/2016  . Constipation in male 11/02/2016  . Insomnia due to anxiety and fear 11/02/2016  . Colon cancer screening 05/04/2015  . Vitamin D deficiency 05/02/2015  . Medicare annual wellness visit, subsequent 06/23/2013  . Essential hypertension, benign 03/22/2013  . Osteoarthritis 03/22/2013  . Numbness and tingling in left hand  03/22/2013  . GERD (gastroesophageal reflux disease) 03/22/2013  . BPH (benign prostatic hyperplasia) 03/22/2013    Rexanne Mano, PT 01/05/2018, 7:13 PM  [facility] [address] [place], [place], [address] Phone: [phone]   Fax:  [phone]  Name: Anthony Allen MRN: [id number] Date of Birth: 08-19-1935

## 2018-01-05 NOTE — Patient Instructions (Signed)
Exercises for your right arm. Complete 1x/day  Lying on your back in bed.  Holding a 30 inch stick in your hands. 1)  Chest press: Keep your elbows close to your sides Press stick up toward ceiling.  Keep shoulder blades resting on bed.   Try to have right hand move just like left hand. Work to straighten your right elbow as far as possible.   Don't left your left arm drag your right arm.   Only move in a pain free range.   5-10 repetitions - but do them perfectly.  Rest repeat.  2)  Rainbow arc:   Starting in the same position as #!.  Press stick up from chest. Move the stick in an arc toward forehead, and then down toward waist.   Only move in a pain free range!  Only you will know where that is.   Move slowly and carefully. 5-10 repetitions, rest repeat.    3)  Barrel rolls Starting in same position as in #1 and #2 Move stick in a circle as if over a barrel. 10 repetitions, rest and repeat.

## 2018-01-05 NOTE — Therapy (Signed)
[facility] [address] [place], [place], [address] Phone: [phone]   Fax:  [phone]  Occupational Therapy Treatment  Patient Details  Name: Anthony Allen MRN: [id number] Date of Birth: 1935/08/19 Referring Provider: Venancio Poisson   Encounter Date: 01/05/2018  OT End of Session - 01/05/18 1652    Visit Number  21    Number of Visits  24    Date for OT Re-Evaluation  01/22/18    Authorization Type  [employer] Medicare.  $20 copay each OT/PT    Authorization - Visit Number  21    Authorization - Number of Visits  30    OT Start Time  1402    OT Stop Time  1445    OT Time Calculation (min)  43 min    Activity Tolerance  Patient tolerated treatment well    Behavior During Therapy  [facility] for tasks assessed/performed       Past Medical History:  Diagnosis Date  . Arthritis    "mild in my hands" (06/04/2017)  . Complication of anesthesia    "was told never to use Propofol because of parent's adverse reaction" (06/04/2017)  . CVA (cerebral vascular accident) ([place])    hospitalized from 12/22-12/24 due to left-sided weakness, slurred speech and difficulty swallowing. He was diagnosed as left pontine stroke./notes 06/03/2017  . Family history of adverse reaction to anesthesia    "mother and dad had allergic reaction Propofol" (06/04/2017)  . Glaucoma, both eyes   . Gout   . Hypertension   . Parotid mass   . Type 2 diabetes mellitus with hyperglycemia [facility])     Past Surgical History:  Procedure Laterality Date  . TONSILLECTOMY  1943    There were no vitals filed for this visit.  Subjective Assessment - 01/05/18 1404    Subjective   She made me work my hips    Patient is accompained by:  Family member    Pertinent History  Pt with CVA in December of 2018.  Rehab at SNF then [facility]      Currently in Pain?  No/denies    Pain Score  0-No pain                   OT Treatments/Exercises (OP) -  01/05/18 0001      Neurological Re-education Exercises   Other Exercises 1  Neuromuscular reeducation to determine what exercises patient can now safely complete at home to help build range of motionand strength in right shoulder and elbow.      Other Exercises 2  Patient needed max verbal cueing initially, but with repetition, able to complete three simple shoulder exercises in supine.  Patient able to return demonstrate today - will need to follow up as patient has tendency to hold breath, and hurry through exercises - moving into ranges that cause pain.  Patient not yet able to do exercises requiring horizontal add/abduction - unable to complete without report of pain, and isolated elbow flex/ext form position of shoulder flexion.               OT Education - 01/05/18 1652    Education provided  Yes    Education Details  HEP shoulder exercises supine    Person(s) Educated  Patient    Methods  Explanation;Demonstration;Tactile cues;Verbal cues    Comprehension  Returned demonstration;Verbal cues required;Need further instruction       OT Short Term Goals - 01/01/18 1557  OT SHORT TERM GOAL #1   Title  Patient will tshirt with set up assistance due 11/23/17    Status  Achieved      OT SHORT TERM GOAL #2   Title  Patient will don socks with min assist    Status  Achieved      OT SHORT TERM GOAL #3   Title  Patient will don pants with min assistance    Status  Achieved      OT SHORT TERM GOAL #4   Title  Patient will complete HEP designed to improve shoulder range of motion with min assist and cueing    Status  Achieved        OT Long Term Goals - 01/05/18 1404      OT LONG TERM GOAL #3   Title  Patient will feed himself, and cut up his own food with set up assistance  (Pended)     Status  Achieved  (Pended)       OT LONG TERM GOAL #5   Title  Patient will demonstrate active supination at least 10 degrees beyond neutral to aide in orienting hand to object needed  for functional task  (Pended)             Plan - 01/05/18 1652    Clinical Impression Statement  Patient is nearing achievement of long term goals.      Occupational Profile and client history currently impacting functional performance  Husband, father - son with significant psychiatric dx- currently stable on medication, retired Quarry manager, Financial trader, Geophysicist/field seismologist, and Insurance claims handler performance deficits (Please refer to evaluation for details):  ADL's;IADL's;Rest and Sleep;Leisure;Social Participation    Rehab Potential  Fair    Current Impairments/barriers affecting progress:  concern for RSD right UE     OT Frequency  2x / week    OT Duration  12 weeks    OT Treatment/Interventions  Self-care/ADL training;Electrical Stimulation;Therapeutic exercise;Visual/perceptual remediation/compensation;Patient/family education;Splinting;Neuromuscular education;Paraffin;Moist Heat;Aquatic Therapy;Traction;Fluidtherapy;Functional Mobility Training;Scar mobilization;Therapeutic activities;Balance training;Energy conservation;Cognitive remediation/compensation;Passive range of motion;Manual Therapy;DME and/or AE instruction;Contrast Bath;Ultrasound;Cryotherapy    Plan  shoulder manual therapy - body on arm - NMR RUE/Trunk, functional use of right UE while ambulating with cane,     Clinical Decision Making  Multiple treatment options, significant modification of task necessary    OT Home Exercise Plan  Need to determine what has already been established    Consulted and Agree with Plan of Care  Patient;Family member/caregiver    Family Member Consulted  wife - [name]       Patient will benefit from skilled therapeutic intervention in order to improve the following deficits and impairments:  Decreased cognition, Decreased knowledge of use of DME, Decreased skin integrity, Increased edema, Impaired flexibility, Impaired vision/preception, Pain, Abnormal gait, Cardiopulmonary  status limiting activity, Decreased coordination, Decreased mobility, Increased fascial restrictions, Impaired sensation, Improper body mechanics, Decreased activity tolerance, Decreased endurance, Decreased range of motion, Decreased strength, Impaired tone, Improper spinal/pelvic alignment, Decreased balance, Decreased knowledge of precautions, Decreased safety awareness, Difficulty walking, Impaired perceived functional ability, Impaired UE functional use  Visit Diagnosis: Hemiplegia and hemiparesis following cerebral infarction affecting right dominant side (HCC)  Stiffness of right wrist, not elsewhere classified  Stiffness of right hand, not elsewhere classified  Stiffness of right shoulder, not elsewhere classified  Abnormal posture  Muscle weakness (generalized)  Unsteadiness on feet  Visuospatial deficit  Cognitive social or emotional deficit following cerebral infarction    Problem List Patient Active  Problem List   Diagnosis Date Noted  . Weight loss, unintentional 11/02/2017  . Candidiasis of scrotum 11/02/2017  . Diastolic dysfunction   . Dysphagia, post-stroke   . CKD (chronic kidney disease), stage III ([place]) 06/03/2017  . 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 Stroke (cerebrum) ([place]) 06/03/2017  . CVA (cerebral vascular accident) ([place]) 06/01/2017  . Fatigue 05/08/2017  . Aortic atherosclerosis ([place]) 05/08/2017  . Glaucoma 05/06/2017  . Lipoma of forehead 11/02/2016  . Type II diabetes mellitus with renal manifestations ([place]) 11/02/2016  . Hyperlipidemia LDL goal <100 11/02/2016  . Constipation in male 11/02/2016  . Insomnia due to anxiety and fear 11/02/2016  . Colon cancer screening 05/04/2015  . Vitamin D deficiency 05/02/2015  . Medicare annual wellness visit, subsequent 06/23/2013  . Essential hypertension, benign 03/22/2013  . Osteoarthritis 03/22/2013  . Numbness and tingling in left hand 03/22/2013  . GERD (gastroesophageal reflux disease)  03/22/2013  . BPH (benign prostatic hyperplasia) 03/22/2013    Mariah Milling, OTR/L 01/05/2018, 4:54 PM  [facility] [address] [place], [place], [address] Phone: [phone]   Fax:  [phone]  Name: JABBAR PALMERO MRN: [id number] Date of Birth: 1935-06-14

## 2018-01-08 ENCOUNTER — Encounter: Payer: Self-pay | Admitting: Occupational Therapy

## 2018-01-08 ENCOUNTER — Ambulatory Visit: Payer: Medicare Other | Admitting: Physical Therapy

## 2018-01-08 ENCOUNTER — Encounter: Payer: Self-pay | Admitting: Physical Therapy

## 2018-01-08 ENCOUNTER — Ambulatory Visit: Payer: Medicare Other | Attending: Adult Health | Admitting: Occupational Therapy

## 2018-01-08 DIAGNOSIS — R2689 Other abnormalities of gait and mobility: Secondary | ICD-10-CM | POA: Diagnosis present

## 2018-01-08 DIAGNOSIS — M6281 Muscle weakness (generalized): Secondary | ICD-10-CM | POA: Insufficient documentation

## 2018-01-08 DIAGNOSIS — I69351 Hemiplegia and hemiparesis following cerebral infarction affecting right dominant side: Secondary | ICD-10-CM

## 2018-01-08 DIAGNOSIS — R293 Abnormal posture: Secondary | ICD-10-CM | POA: Diagnosis present

## 2018-01-08 DIAGNOSIS — R2681 Unsteadiness on feet: Secondary | ICD-10-CM | POA: Diagnosis present

## 2018-01-08 DIAGNOSIS — M25641 Stiffness of right hand, not elsewhere classified: Secondary | ICD-10-CM | POA: Diagnosis present

## 2018-01-08 DIAGNOSIS — M25631 Stiffness of right wrist, not elsewhere classified: Secondary | ICD-10-CM | POA: Diagnosis present

## 2018-01-08 DIAGNOSIS — M25611 Stiffness of right shoulder, not elsewhere classified: Secondary | ICD-10-CM | POA: Diagnosis present

## 2018-01-08 DIAGNOSIS — R41842 Visuospatial deficit: Secondary | ICD-10-CM | POA: Insufficient documentation

## 2018-01-08 DIAGNOSIS — I69315 Cognitive social or emotional deficit following cerebral infarction: Secondary | ICD-10-CM | POA: Insufficient documentation

## 2018-01-08 NOTE — Therapy (Signed)
[facility] [address] [place], [place], [address] Phone: [phone]   Fax:  [phone]  Physical Therapy Treatment  Patient Details  Name: Anthony Allen MRN: [id number] Date of Birth: 21-May-1936 Referring Provider: Venancio Poisson   Encounter Date: 01/08/2018   This progress note covers the period from 11/28/17 (10th visit) to 01/08/18 (20th visit)   PT End of Session - 01/08/18 1711    Visit Number  20    Number of Visits  25    Date for PT Re-Evaluation  34/74/25 recertified for 60 days but anticipate D/C at 4 wks    Authorization Type  [employer] Medicare - 10th visit PN     PT Start Time  1316    PT Stop Time  1357    PT Time Calculation (min)  41 min    Equipment Utilized During Treatment  Gait belt    Activity Tolerance  Patient tolerated treatment well    Behavior During Therapy  [facility] for tasks assessed/performed       Past Medical History:  Diagnosis Date  . Arthritis    "mild in my hands" (06/04/2017)  . Complication of anesthesia    "was told never to use Propofol because of parent's adverse reaction" (06/04/2017)  . CVA (cerebral vascular accident) ([place])    hospitalized from 12/22-12/24 due to left-sided weakness, slurred speech and difficulty swallowing. He was diagnosed as left pontine stroke./notes 06/03/2017  . Family history of adverse reaction to anesthesia    "mother and dad had allergic reaction Propofol" (06/04/2017)  . Glaucoma, both eyes   . Gout   . Hypertension   . Parotid mass   . Type 2 diabetes mellitus with hyperglycemia [facility])     Past Surgical History:  Procedure Laterality Date  . TONSILLECTOMY  1943    There were no vitals filed for this visit.  Subjective Assessment - 01/08/18 1317    Subjective  No changes. No falls. When wearing Bioness on his RLE, it feels stronger than his LLE    Patient is accompained by:  Family member    Pertinent History  glaucoma    Limitations  House hold activities;Walking;Standing    Patient Stated Goals  "walk better."     Currently in Pain?  No/denies                       [facility] Adult PT Treatment/Exercise - 01/08/18 1702      Transfers   Sit to Stand  5: Supervision;Without upper extremity assist;From bed    Sit to Stand Details (indicate cue type and reason)  with bioness in training mode; 5 sec cycles x 10 reps      Ambulation/Gait   Ambulation/Gait Assistance  4: Min guard;4: Min assist    Ambulation/Gait Assistance Details  with RW vc for proximity to RW and upright posture; min assist for balance with no device (HHA) and minguard with SPC with rubber quad tip; assist for lateral wt-shift over RLE without trendelenberg on the left to allow improved LLE step length    Ambulation Distance (Feet)  80 Feet 150, 150, 120,     Assistive device  Rolling walker;Straight cane;1 person hand held assist    Gait Pattern  Step-through pattern;Decreased arm swing - right;Decreased dorsiflexion - right;Decreased weight shift to right;Trunk flexed;Narrow base of support;Poor foot clearance - right;Right foot flat;Decreased step length - left;Right flexed knee in stance    Ambulation Surface  Indoor    Stairs  Yes    Stairs Assistance  4: Min guard    Stairs Assistance Details (indicate cue type and reason)  with bioness and without (due to ?timing of device on stairs);     Stair Management Technique  One rail Right;Two rails;Alternating pattern;Forwards;With cane 2 rails, then one rail and cane     Number of Stairs  16    Height of Stairs  6    Gait Comments  with bioness for Rt quads and rt dorsiflexors for most gait and sit to stand                PT Short Term Goals - 12/01/17 2019      PT SHORT TERM GOAL #1   Title  Pt/wife will initiate HEP in order to indicate improved functional mobility (Target date: 11/23/17)    Time  4    Period  Weeks    Status  New      PT SHORT TERM GOAL #2    Title  Pt will improve TUG to </=33 secs w/ LRAD at S level in order to indicate decreased fall risk.      Baseline  21.94 secs with RW 11/20/17    Time  4    Period  Weeks    Status  Achieved      PT SHORT TERM GOAL #3   Title  Will assess BERG and improve score by 3 points from baseline in order to indicate decreased fall risk.     Baseline  41 at baseline, 39 on 11/20/17    Time  4    Period  Weeks    Status  Not Met      PT SHORT TERM GOAL #4   Title  Pt will improve 5TSS to </= 21 secs with single UE support only in order to indicate decreased fall risk and improved functional strength.      Baseline  11.81 secs with single UE support 11/20/17    Time  4    Period  Weeks    Status  Achieved      PT SHORT TERM GOAL #5   Title  Pt will improve gait speed to 1.94 ft/sec w/ LRAD at S level in order to indicate decreased fall risk.      Baseline  1.62 ft/sec when cued "walk your normal pace" and 2.77 ft/sec when cued to speed up slightly.  Feel that he is able to ambulate safely at a speed somewhere  between these two speeds.  11/20/17    Time  4    Period  Weeks    Status  Partially Met      PT SHORT TERM GOAL #6   Title  Pt will ambulate short household distances (up to 50') w/ quad cane at S level in order to indicate improved independence at home.     Baseline  requires intermittent min/guard (assessed with both SBQC and quad tip cane and recommend he continue with quad tip cane. )    Time  4    Period  Weeks    Status  Partially Met        PT Long Term Goals - 12/29/17 1326      PT LONG TERM GOAL #1   Title  Pt/wife will be independent with final HEP in order to indicate improved functional mobility and decreased fall risk. (Target Date: 01/28/18)    Time  4  Period  Weeks    Status  On-going    Target Date  01/28/18      PT LONG TERM GOAL #2   Title  Pt will improve BERG balance score to 49/56 in order to indicate decreased fall risk.     Baseline  41 > 45/56  improved by 4 points    Time  4    Period  Weeks    Status  Revised      PT LONG TERM GOAL #3   Title  Pt will improve TUG time to </=18 secs w/ quad tip cane at mod I level in order to indicate decreased fall risk.      Baseline  17 with RW, 21 with cane    Time  8    Period  Weeks    Status  Revised      PT LONG TERM GOAL #4   Title  Pt will improve 5TSS to </=15 secs without UE support in order to indicate improved functional strength and decreased fall risk.      Baseline  13.79 seconds without UE support    Time  8    Period  Weeks    Status  Achieved      PT LONG TERM GOAL #5   Title  Pt will ambulate x 300' w/ LRAD over indoor surfaces at mod I level in order to indicate improved functional mobility.      Baseline  460' with supervision-min A over indoor surfaces    Time  4    Period  Weeks    Status  Revised      PT LONG TERM GOAL #6   Title  Pt will ambulate x 300' over unlevel paved outdoor surfaces w/ LRAD at S level in order to indicate improved community negotiation.      Time  4    Period  Weeks    Status  On-going            Plan - 01/08/18 1712    Clinical Impression Statement  Bioness applied to rt quads and dorsiflexors to improve muscular contraction, muscular endurance and gait timing. Utilized bioness for strengthening during sit to stand activities and stair training. Patient continues to progress towards LTGs.     Rehab Potential  Good    Clinical Impairments Affecting Rehab Potential  Pt very motivated     PT Frequency  2x / week    PT Duration  4 weeks but will recertify for 60 more days    PT Treatment/Interventions  ADLs/Self Care Home Management;Electrical Stimulation;DME Instruction;Gait training;Stair training;Functional mobility training;Therapeutic activities;Therapeutic exercise;Balance training;Neuromuscular re-education;Patient/family education;Orthotic Fit/Training;Passive range of motion;Vestibular    PT Next Visit Plan  Improved  trunk/core activation to carryover to gait, Bioness each session (assist with paperwork for home unit--Rahul [place]in[place] did not do--pt is [facility] medicare and wasn't sure it's an option??)(R quad and R lower leg), R LE NMR (hip and knee extension, R DF), go over HEP intermittently, gait with quad tip cane, stairs with cane and no rails (to get into man cave)    Consulted and Agree with Plan of Care  Patient;Family member/caregiver    Family Member Consulted  wife Baldwin [place] (goes by [employer])       Patient will benefit from skilled therapeutic intervention in order to improve the following deficits and impairments:  Abnormal gait, Decreased activity tolerance, Decreased balance, Decreased coordination, Decreased endurance, Decreased knowledge of use of DME, Decreased mobility, Decreased range of  motion, Decreased strength, Impaired perceived functional ability, Impaired flexibility, Improper body mechanics, Postural dysfunction, Impaired UE functional use, Difficulty walking  Visit Diagnosis: Hemiplegia and hemiparesis following cerebral infarction affecting right dominant side (HCC)  Muscle weakness (generalized)  Unsteadiness on feet  Other abnormalities of gait and mobility     Problem List Patient Active Problem List   Diagnosis Date Noted  . Weight loss, unintentional 11/02/2017  . Candidiasis of scrotum 11/02/2017  . Diastolic dysfunction   . Dysphagia, post-stroke   . CKD (chronic kidney disease), stage III ([place]) 06/03/2017  . 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 Stroke (cerebrum) ([place]) 06/03/2017  . CVA (cerebral vascular accident) ([place]) 06/01/2017  . Fatigue 05/08/2017  . Aortic atherosclerosis ([place]) 05/08/2017  . Glaucoma 05/06/2017  . Lipoma of forehead 11/02/2016  . Type II diabetes mellitus with renal manifestations ([place]) 11/02/2016  . Hyperlipidemia LDL goal <100 11/02/2016  . Constipation in male 11/02/2016  . Insomnia due to anxiety and fear 11/02/2016  . Colon cancer screening  05/04/2015  . Vitamin D deficiency 05/02/2015  . Medicare annual wellness visit, subsequent 06/23/2013  . Essential hypertension, benign 03/22/2013  . Osteoarthritis 03/22/2013  . Numbness and tingling in left hand 03/22/2013  . GERD (gastroesophageal reflux disease) 03/22/2013  . BPH (benign prostatic hyperplasia) 03/22/2013    Rexanne Mano, PT 01/08/2018, 5:21 PM  [facility] [address] [place], [place], [address] Phone: [phone]   Fax:  [phone]  Name: Anthony Allen MRN: [id number] Date of Birth: 08-31-1935

## 2018-01-08 NOTE — Therapy (Signed)
[facility] [address] [place], [place], [address] Phone: [phone]   Fax:  [phone]  Occupational Therapy Treatment  Patient Details  Name: Anthony Allen MRN: [id number] Date of Birth: 1936/05/18 Referring Provider: Venancio Poisson   Encounter Date: 01/08/2018  OT End of Session - 01/08/18 2043    Visit Number  22    Number of Visits  24    Date for OT Re-Evaluation  01/22/18    Authorization Type  [employer] Medicare.  $20 copay each OT/PT    Authorization - Visit Number  22    OT Start Time  1400    OT Stop Time  1443    OT Time Calculation (min)  43 min    Activity Tolerance  Patient tolerated treatment well    Behavior During Therapy  [facility] for tasks assessed/performed       Past Medical History:  Diagnosis Date  . Arthritis    "mild in my hands" (06/04/2017)  . Complication of anesthesia    "was told never to use Propofol because of parent's adverse reaction" (06/04/2017)  . CVA (cerebral vascular accident) ([place])    hospitalized from 12/22-12/24 due to left-sided weakness, slurred speech and difficulty swallowing. He was diagnosed as left pontine stroke./notes 06/03/2017  . Family history of adverse reaction to anesthesia    "mother and dad had allergic reaction Propofol" (06/04/2017)  . Glaucoma, both eyes   . Gout   . Hypertension   . Parotid mass   . Type 2 diabetes mellitus with hyperglycemia [facility])     Past Surgical History:  Procedure Laterality Date  . TONSILLECTOMY  1943    There were no vitals filed for this visit.  Subjective Assessment - 01/08/18 1407    Subjective   I did something good this morning, I replaced the batteries in a clock and reset the clock.    Patient is accompained by:  Family member    Pertinent History  Pt with CVA in December of 2018.  Rehab at SNF then [facility].      Currently in Pain?  No/denies    Pain Score  0-No pain                   OT  Treatments/Exercises (OP) - 01/08/18 2038      Neurological Re-education Exercises   Other Exercises 1  Neuromuscular reeducation to address low reach to mid reach with right UE.  Mod facilitation to address weight shift onto right LEfor lateral reach.  Worked to incorporate functional reach to obtain lightweight items from countertop, low cabinets.  Patient able to open chest height cabinet with right UE,although using compesatory strategies to do so.        RUE Fluidotherapy   Number Minutes Fluidotherapy  5 Minutes    RUE Fluidotherapy Location  Hand;Wrist;Forearm    Comments  digits wrapped in nearly full flexion as tolerated.               OT Education - 01/08/18 2043    Education provided  Yes    Education Details  reviewed plan of care, and plan to discharge as planned, and encouraged 2 month break from OT    Person(s) Educated  Patient;Spouse    Methods  Explanation    Comprehension  Verbalized understanding       OT Short Term Goals - 01/01/18 1557      OT SHORT TERM GOAL #1   Title  Patient will tshirt with set up assistance due 11/23/17    Status  Achieved      OT SHORT TERM GOAL #2   Title  Patient will don socks with min assist    Status  Achieved      OT SHORT TERM GOAL #3   Title  Patient will don pants with min assistance    Status  Achieved      OT SHORT TERM GOAL #4   Title  Patient will complete HEP designed to improve shoulder range of motion with min assist and cueing    Status  Achieved        OT Long Term Goals - 01/05/18 1404      OT LONG TERM GOAL #3   Title  Patient will feed himself, and cut up his own food with set up assistance  (Pended)     Status  Achieved  (Pended)       OT LONG TERM GOAL #5   Title  Patient will demonstrate active supination at least 10 degrees beyond neutral to aide in orienting hand to object needed for functional task  (Pended)             Plan - 01/08/18 2044    Clinical Impression Statement  Patient  is nearing achievement of long term goals.      Occupational Profile and client history currently impacting functional performance  Husband, father - son with significant psychiatric dx- currently stable on medication, retired Quarry manager, Financial trader, Geophysicist/field seismologist, and Insurance claims handler performance deficits (Please refer to evaluation for details):  ADL's;IADL's;Rest and Sleep;Leisure;Social Participation    Rehab Potential  Fair    Current Impairments/barriers affecting progress:  concern for RSD right UE     OT Frequency  2x / week    OT Duration  12 weeks    OT Treatment/Interventions  Self-care/ADL training;Electrical Stimulation;Therapeutic exercise;Visual/perceptual remediation/compensation;Patient/family education;Splinting;Neuromuscular education;Paraffin;Moist Heat;Aquatic Therapy;Traction;Fluidtherapy;Functional Mobility Training;Scar mobilization;Therapeutic activities;Balance training;Energy conservation;Cognitive remediation/compensation;Passive range of motion;Manual Therapy;DME and/or AE instruction;Contrast Bath;Ultrasound;Cryotherapy    Plan  shoulder manual therapy - body on arm - NMR RUE/Trunk, functional use of right UE while ambulating with cane,     Clinical Decision Making  Multiple treatment options, significant modification of task necessary    Consulted and Agree with Plan of Care  Patient;Family member/caregiver    Family Member Consulted  wife - [name]       Patient will benefit from skilled therapeutic intervention in order to improve the following deficits and impairments:  Decreased cognition, Decreased knowledge of use of DME, Decreased skin integrity, Increased edema, Impaired flexibility, Impaired vision/preception, Pain, Abnormal gait, Cardiopulmonary status limiting activity, Decreased coordination, Decreased mobility, Increased fascial restrictions, Impaired sensation, Improper body mechanics, Decreased activity tolerance, Decreased endurance,  Decreased range of motion, Decreased strength, Impaired tone, Improper spinal/pelvic alignment, Decreased balance, Decreased knowledge of precautions, Decreased safety awareness, Difficulty walking, Impaired perceived functional ability, Impaired UE functional use  Visit Diagnosis: Hemiplegia 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 Weight loss, unintentional 11/02/2017  . Candidiasis of scrotum 11/02/2017  . Diastolic dysfunction   .  Dysphagia, post-stroke   . CKD (chronic kidney disease), stage III ([place]) 06/03/2017  . 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 Stroke (cerebrum) ([place]) 06/03/2017  . CVA (cerebral vascular accident) ([place]) 06/01/2017  . Fatigue 05/08/2017  . Aortic atherosclerosis ([place]) 05/08/2017  . Glaucoma 05/06/2017  . Lipoma of forehead 11/02/2016  . Type II diabetes mellitus with renal manifestations ([place]) 11/02/2016  . Hyperlipidemia LDL goal <100 11/02/2016  . Constipation in male 11/02/2016  . Insomnia due to anxiety and fear 11/02/2016  . Colon cancer screening 05/04/2015  . Vitamin D deficiency 05/02/2015  . Medicare annual wellness visit, subsequent 06/23/2013  . Essential hypertension, benign 03/22/2013  . Osteoarthritis 03/22/2013  . Numbness and tingling in left hand 03/22/2013  . GERD (gastroesophageal reflux disease) 03/22/2013  . BPH (benign prostatic hyperplasia) 03/22/2013    Mariah Milling, OTR/L 01/08/2018, 8:46 PM  [facility] [address] [place], [place], [address] Phone: [phone]   Fax:  [phone]  Name: MITUL HALLOWELL MRN: [id number] Date of Birth: 04-13-1936

## 2018-01-12 ENCOUNTER — Encounter: Payer: Self-pay | Admitting: Rehabilitation

## 2018-01-12 ENCOUNTER — Ambulatory Visit: Payer: Medicare Other | Admitting: Rehabilitation

## 2018-01-12 ENCOUNTER — Ambulatory Visit: Payer: Medicare Other | Admitting: Occupational Therapy

## 2018-01-12 ENCOUNTER — Encounter: Payer: Self-pay | Admitting: Occupational Therapy

## 2018-01-12 DIAGNOSIS — I69351 Hemiplegia and hemiparesis following cerebral infarction affecting right dominant side: Secondary | ICD-10-CM

## 2018-01-12 DIAGNOSIS — R41842 Visuospatial deficit: Secondary | ICD-10-CM

## 2018-01-12 DIAGNOSIS — R293 Abnormal posture: Secondary | ICD-10-CM

## 2018-01-12 DIAGNOSIS — M25641 Stiffness of right hand, not elsewhere classified: Secondary | ICD-10-CM

## 2018-01-12 DIAGNOSIS — I69315 Cognitive social or emotional deficit following cerebral infarction: Secondary | ICD-10-CM

## 2018-01-12 DIAGNOSIS — R2681 Unsteadiness on feet: Secondary | ICD-10-CM

## 2018-01-12 DIAGNOSIS — M6281 Muscle weakness (generalized): Secondary | ICD-10-CM

## 2018-01-12 DIAGNOSIS — M25611 Stiffness of right shoulder, not elsewhere classified: Secondary | ICD-10-CM

## 2018-01-12 DIAGNOSIS — M25631 Stiffness of right wrist, not elsewhere classified: Secondary | ICD-10-CM

## 2018-01-12 NOTE — Therapy (Signed)
[facility] [address] [place], [place], [address] Phone: [phone]   Fax:  [phone]  Occupational Therapy Treatment  Patient Details  Name: Anthony Allen MRN: [id number] Date of Birth: 06/04/1936 Referring Provider: Venancio Poisson   Encounter Date: 01/12/2018  OT End of Session - 01/12/18 1542    Visit Number  23    Number of Visits  24    Date for OT Re-Evaluation  01/22/18    Authorization Type  [employer] Medicare.  $20 copay each OT/PT    Authorization - Visit Number  23    Authorization - Number of Visits  30    OT Start Time  0071    OT Stop Time  1529    OT Time Calculation (min)  42 min    Activity Tolerance  Patient tolerated treatment well    Behavior During Therapy  [facility] for tasks assessed/performed       Past Medical History:  Diagnosis Date  . Arthritis    "mild in my hands" (06/04/2017)  . Complication of anesthesia    "was told never to use Propofol because of parent's adverse reaction" (06/04/2017)  . CVA (cerebral vascular accident) ([place])    hospitalized from 12/22-12/24 due to left-sided weakness, slurred speech and difficulty swallowing. He was diagnosed as left pontine stroke./notes 06/03/2017  . Family history of adverse reaction to anesthesia    "mother and dad had allergic reaction Propofol" (06/04/2017)  . Glaucoma, both eyes   . Gout   . Hypertension   . Parotid mass   . Type 2 diabetes mellitus with hyperglycemia [facility]     Past Surgical History:  Procedure Laterality Date  . TONSILLECTOMY  1943    There were no vitals filed for this visit.  Subjective Assessment - 01/12/18 1454    Subjective   It often doesn't hurt at all (right shoulder)  .  I use a pillow to support it in my big chair.    Patient is accompained by:  Family member    Pertinent History  Pt with CVA in December of 2018.  Rehab at SNF then [facility].      Currently in Pain?  No/denies    Pain Score  0-No  pain                   OT Treatments/Exercises (OP) - 01/12/18 0001      ADLs   Eating  Patient able to cut food and feed self     ADL Comments  Reviewed all goals - patie t has met all goals and discussed discharging one visit earlier than planned.        Neurological Re-education Exercises   Other Exercises 1  Patient is completing shoulder exercises with dowel (cane) in supine.  Patient reports significnat reduction in shoulder pain.      Other Exercises 2  Patient reordered a flexion glove to replace his current glove.               OT Education - 01/12/18 1542    Education provided  Yes    Education Details  encouraged 8 week break from OT.      Person(s) Educated  Patient;Spouse    Methods  Explanation    Comprehension  Verbalized understanding       OT Short Term Goals - 01/01/18 1557      OT SHORT TERM GOAL #1   Title  Patient will tshirt with  set up assistance due 11/23/17    Status  Achieved      OT SHORT TERM GOAL #2   Title  Patient will don socks with min assist    Status  Achieved      OT SHORT TERM GOAL #3   Title  Patient will don pants with min assistance    Status  Achieved      OT SHORT TERM GOAL #4   Title  Patient will complete HEP designed to improve shoulder range of motion with min assist and cueing    Status  Achieved        OT Long Term Goals - 01/12/18 1522      OT LONG TERM GOAL #3   Title  Patient will feed himself, and cut up his own food with set up assistance    Status  Achieved      OT LONG TERM GOAL #4   Title  Patient will demonstrate at keast 5 degrees additional PIP flexion in digits 2-5 of right hand to aide in grasp and pinch use.      Status  Achieved      OT LONG TERM GOAL #5   Title  Patient will demonstrate active supination at least 10 degrees beyond neutral to aide in orienting hand to object needed for functional task    Status  Achieved      OT LONG TERM GOAL #6   Title  Patient will  demonstrate active low reach in right arm to obtain lightweight (1 lb or less)  item from countertop or tabletop if standing    Status  Achieved      OT LONG TERM GOAL #7   Title  Patient will demonstrate at leaast 90 degrees of passive shoulder flexion or abduction with pain no greater than 2/10 to aide with hygiene and dressing tasks    Status  Achieved            Plan - 01/12/18 1543    Clinical Impression Statement  Patient has met all goals and is agreeable to OT discharge    Occupational Profile and client history currently impacting functional performance  Husband, father - son with significant psychiatric dx- currently stable on medication, retired Quarry manager, Financial trader, Geophysicist/field seismologist, and Insurance claims handler performance deficits (Please refer to evaluation for details):  ADL's;IADL's;Rest and Sleep;Leisure;Social Participation    Rehab Potential  Fair    Current Impairments/barriers affecting progress:  concern for RSD right UE     OT Frequency  2x / week    OT Duration  12 weeks    OT Treatment/Interventions  Self-care/ADL training;Electrical Stimulation;Therapeutic exercise;Visual/perceptual remediation/compensation;Patient/family education;Splinting;Neuromuscular education;Paraffin;Moist Heat;Aquatic Therapy;Traction;Fluidtherapy;Functional Mobility Training;Scar mobilization;Therapeutic activities;Balance training;Energy conservation;Cognitive remediation/compensation;Passive range of motion;Manual Therapy;DME and/or AE instruction;Contrast Bath;Ultrasound;Cryotherapy    Plan  discharge from OT    Clinical Decision Making  Multiple treatment options, significant modification of task necessary    Consulted and Agree with Plan of Care  Patient;Family member/caregiver    Family Member Consulted  wife - Anthony Allen       Patient will benefit from skilled therapeutic intervention in order to improve the following deficits and impairments:  Decreased cognition,  Decreased knowledge of use of DME, Decreased skin integrity, Increased edema, Impaired flexibility, Impaired vision/preception, Pain, Abnormal gait, Cardiopulmonary status limiting activity, Decreased coordination, Decreased mobility, Increased fascial restrictions, Impaired sensation, Improper body mechanics, Decreased activity tolerance, Decreased endurance, Decreased range of motion, Decreased strength, Impaired tone, Improper spinal/pelvic alignment,  Decreased balance, Decreased knowledge of precautions, Decreased safety awareness, Difficulty walking, Impaired perceived functional ability, Impaired UE functional use  Visit Diagnosis: Hemiplegia 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 Weight loss, unintentional 11/02/2017  . Candidiasis of scrotum 11/02/2017  . Diastolic dysfunction   . Dysphagia, post-stroke   . CKD (chronic kidney disease), stage III ([place]) 06/03/2017  . 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 Stroke (cerebrum) ([place]) 06/03/2017  . CVA (cerebral vascular accident) ([place]) 06/01/2017  . Fatigue 05/08/2017  . Aortic atherosclerosis ([place]) 05/08/2017  . Glaucoma 05/06/2017  . Lipoma of forehead 11/02/2016  . Type II diabetes mellitus with renal manifestations ([place]) 11/02/2016  . Hyperlipidemia LDL goal <100 11/02/2016  . Constipation in male 11/02/2016  . Insomnia due to anxiety and fear 11/02/2016  . Colon cancer screening 05/04/2015  . Vitamin D deficiency 05/02/2015  . Medicare annual wellness visit, subsequent 06/23/2013  .  Essential hypertension, benign 03/22/2013  . Osteoarthritis 03/22/2013  . Numbness and tingling in left hand 03/22/2013  . GERD (gastroesophageal reflux disease) 03/22/2013  . BPH (benign prostatic hyperplasia) 03/22/2013   OCCUPATIONAL THERAPY DISCHARGE SUMMARY  Visits from Start of Care: 23  Current functional level related to goals / functional outcomes:    Remaining deficits: Right hemiplegia, right inattention, shoulder/elbow/forearm/wrist/digit range of motion   Education / Equipment: HEP and home activities program  Plan: Patient agrees to discharge.  Patient goals were partially met. Patient is being discharged due to meeting the stated rehab goals.  ?????      Mariah Milling, OTR/L 01/12/2018, 3:44 PM  [facility] [address] [place], [place], [address] Phone: [phone]   Fax:  [phone]  Name: Anthony Allen MRN: [id number] Date of Birth: Oct 14, 1935

## 2018-01-12 NOTE — Therapy (Signed)
[facility] [address] [place], [place], [address] Phone: [phone]   Fax:  [phone]  Physical Therapy Treatment  Patient Details  Name: Anthony Allen MRN: [id number] Date of Birth: [birth year] Referring Provider: Venancio Poisson   Encounter Date: 01/12/2018  PT End of Session - 01/12/18 1605    Visit Number  21    Number of Visits  25    Date for PT Re-Evaluation  59/74/16 recertified for 60 days but anticipate D/C at 4 wks    Authorization Type  [employer] Medicare - 10th visit PN     PT Start Time  1403    PT Stop Time  1445    PT Time Calculation (min)  42 min    Equipment Utilized During Treatment  Gait belt    Activity Tolerance  Patient tolerated treatment well    Behavior During Therapy  [facility] for tasks assessed/performed       Past Medical History:  Diagnosis Date  . Arthritis    "mild in my hands" (06/04/2017)  . Complication of anesthesia    "was told never to use Propofol because of parent's adverse reaction" (06/04/2017)  . CVA (cerebral vascular accident) ([place])    hospitalized from 12/22-12/24 due to left-sided weakness, slurred speech and difficulty swallowing. He was diagnosed as left pontine stroke./notes 06/03/2017  . Family history of adverse reaction to anesthesia    "mother and dad had allergic reaction Propofol" (06/04/2017)  . Glaucoma, both eyes   . Gout   . Hypertension   . Parotid mass   . Type 2 diabetes mellitus with hyperglycemia [facility])     Past Surgical History:  Procedure Laterality Date  . TONSILLECTOMY  1943    There were no vitals filed for this visit.  Subjective Assessment - 01/12/18 1413    Subjective  Pt reports no changes and no falls since last visit.     Patient is accompained by:  Family member    Pertinent History  glaucoma    Limitations  House hold activities;Walking;Standing    Patient Stated Goals  "walk better."     Currently in Pain?  No/denies                        [facility] Adult PT Treatment/Exercise - 01/12/18 1405      Ambulation/Gait   Ambulation/Gait  Yes    Ambulation/Gait Assistance  5: Supervision;4: Min guard    Ambulation/Gait Assistance Details  Trialed two AFOs during session as PT is filing paperwork with [employer], however would like to have a backup plan in case B[employer] in denied.  Pt verbalized understanding.  First trialed use of Ottobock walk on PLS on RLE.  This was uncomfortable for pt and also note that due to strut running medially, tended to internally rotate his RLE during gait.  Therefore then trialed use of Spry step which is more stout PLS but runs slightly laterally which assisted with improved alignment.  Note that this definitely assisted with foot clearance, however pt continues to demonstrate decrease hip and knee flex, still causing foot to drag intermittently.  Pt is able to ambulate with quad tip cane with no more than min/guard A during session.  Cues for posture, and increased R hip and knee flexion during swing phase of gait.      Ambulation Distance (Feet)  115 Feet with ottobock, 300' w/ spry step    Assistive device  -- quad  tip cane    Gait Pattern  Step-through pattern;Decreased arm swing - right;Decreased dorsiflexion - right;Decreased weight shift to right;Trunk flexed;Narrow base of support;Poor foot clearance - right;Right foot flat;Decreased step length - left;Right flexed knee in stance    Ambulation Surface  Level;Indoor    Stairs  Yes    Stairs Assistance  4: Min guard    Stairs Assistance Details (indicate cue type and reason)  Had pt negotiate up/down stairs with cane only with spry step AFO at min/guard level.  Single instance of R toe catching on step with cues for increased hip/knee flexion.      Stair Management Technique  No rails;Step to pattern;Forwards;With cane    Number of Stairs  8    Height of Stairs  6      Neuro Re-ed    Neuro Re-ed Details   NMR in // bars with  spry step AFO donned; stepping forward over two thick balance beams both forwards/backwards with BUE support>single UE support with cues for increased step length and increased R hip and knee flex to carryover to gait.  Also had pt stand with R hip in slight extension with R toes on ground performing active R hamstring curl x 10 reps with cues for posture and tactile cues at R hip to decrease R hip flex.  Then had pt perform in prone to decrease hip flex and isolate hamstring activation.  Pt able to do x 10 reps with cues for decrease ROM to avoid trunk compensation.               PT Education - 01/12/18 1604    Education provided  Yes    Education Details  Discussed rationale for assessment of braces in case [employer] claim denied.      Person(s) Educated  Patient;Spouse    Methods  Explanation    Comprehension  Verbalized understanding       PT Short Term Goals - 12/01/17 2019      PT SHORT TERM GOAL #1   Title  Pt/wife will initiate HEP in order to indicate improved functional mobility (Target date: 11/23/17)    Time  4    Period  Weeks    Status  New      PT SHORT TERM GOAL #2   Title  Pt will improve TUG to </=33 secs w/ LRAD at S level in order to indicate decreased fall risk.      Baseline  21.94 secs with RW 11/20/17    Time  4    Period  Weeks    Status  Achieved      PT SHORT TERM GOAL #3   Title  Will assess BERG and improve score by 3 points from baseline in order to indicate decreased fall risk.     Baseline  41 at baseline, 39 on 11/20/17    Time  4    Period  Weeks    Status  Not Met      PT SHORT TERM GOAL #4   Title  Pt will improve 5TSS to </= 21 secs with single UE support only in order to indicate decreased fall risk and improved functional strength.      Baseline  11.81 secs with single UE support 11/20/17    Time  4    Period  Weeks    Status  Achieved      PT SHORT TERM GOAL #5   Title  Pt will improve gait speed to  1.94 ft/sec w/ LRAD at S level in  order to indicate decreased fall risk.      Baseline  1.62 ft/sec when cued "walk your normal pace" and 2.77 ft/sec when cued to speed up slightly.  Feel that he is able to ambulate safely at a speed somewhere  between these two speeds.  11/20/17    Time  4    Period  Weeks    Status  Partially Met      PT SHORT TERM GOAL #6   Title  Pt will ambulate short household distances (up to 50') w/ quad cane at S level in order to indicate improved independence at home.     Baseline  requires intermittent min/guard (assessed with both SBQC and quad tip cane and recommend he continue with quad tip cane. )    Time  4    Period  Weeks    Status  Partially Met        PT Long Term Goals - 12/29/17 1326      PT LONG TERM GOAL #1   Title  Pt/wife will be independent with final HEP in order to indicate improved functional mobility and decreased fall risk. (Target Date: 01/28/18)    Time  4    Period  Weeks    Status  On-going    Target Date  01/28/18      PT LONG TERM GOAL #2   Title  Pt will improve BERG balance score to 49/56 in order to indicate decreased fall risk.     Baseline  41 > 45/56 improved by 4 points    Time  4    Period  Weeks    Status  Revised      PT LONG TERM GOAL #3   Title  Pt will improve TUG time to </=18 secs w/ quad tip cane at mod I level in order to indicate decreased fall risk.      Baseline  17 with RW, 21 with cane    Time  8    Period  Weeks    Status  Revised      PT LONG TERM GOAL #4   Title  Pt will improve 5TSS to </=15 secs without UE support in order to indicate improved functional strength and decreased fall risk.      Baseline  13.79 seconds without UE support    Time  8    Period  Weeks    Status  Achieved      PT LONG TERM GOAL #5   Title  Pt will ambulate x 300' w/ LRAD over indoor surfaces at mod I level in order to indicate improved functional mobility.      Baseline  460' with supervision-min A over indoor surfaces    Time  4    Period   Weeks    Status  Revised      PT LONG TERM GOAL #6   Title  Pt will ambulate x 300' over unlevel paved outdoor surfaces w/ LRAD at S level in order to indicate improved community negotiation.      Time  4    Period  Weeks    Status  On-going            Plan - 01/12/18 1605    Clinical Impression Statement  Skilled session focused on initial assessment of bracing for RLE to improve R foot clearance.  Trialed use of Ottobock PLS AFO and Spry Step PLS AFO  with spry step providing better support.  Would like to try a few more options to have back up in case of [employer] being denied.      Rehab Potential  Good    Clinical Impairments Affecting Rehab Potential  Pt very motivated     PT Frequency  2x / week    PT Duration  4 weeks but will recertify for 60 more days    PT Treatment/Interventions  ADLs/Self Care Home Management;Electrical Stimulation;DME Instruction;Gait training;Stair training;Functional mobility training;Therapeutic activities;Therapeutic exercise;Balance training;Neuromuscular re-education;Patient/family education;Orthotic Fit/Training;Passive range of motion;Vestibular    PT Next Visit Plan  Improved trunk/core activation to carryover to gait, [employer] each session-Zayla Agar had them fill out and sent off-I tried other braces to see what may work if Bioness denied.  (R quad and R lower leg), R LE NMR (hip and knee extension, R DF), go over HEP intermittently, gait with quad tip cane, stairs with cane and no rails (to get into man cave)    Consulted and Agree with Plan of Care  Patient;Family member/caregiver    Family Member Consulted  wife Baldwin [place] (goes by [employer])       Patient will benefit from skilled therapeutic intervention in order to improve the following deficits and impairments:  Abnormal gait, Decreased activity tolerance, Decreased balance, Decreased coordination, Decreased endurance, Decreased knowledge of use of DME, Decreased mobility, Decreased range of motion,  Decreased strength, Impaired perceived functional ability, Impaired flexibility, Improper body mechanics, Postural dysfunction, Impaired UE functional use, Difficulty walking  Visit Diagnosis: Hemiplegia and hemiparesis following cerebral infarction affecting right dominant side (HCC)  Muscle weakness (generalized)  Unsteadiness on feet  Abnormal posture     Problem List Patient Active Problem List   Diagnosis Date Noted  . Weight loss, unintentional 11/02/2017  . Candidiasis of scrotum 11/02/2017  . Diastolic dysfunction   . Dysphagia, post-stroke   . CKD (chronic kidney disease), stage III ([place]) 06/03/2017  . 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 Stroke (cerebrum) ([place]) 06/03/2017  . CVA (cerebral vascular accident) ([place]) 06/01/2017  . Fatigue 05/08/2017  . Aortic atherosclerosis ([place]) 05/08/2017  . Glaucoma 05/06/2017  . Lipoma of forehead 11/02/2016  . Type II diabetes mellitus with renal manifestations ([place]) 11/02/2016  . Hyperlipidemia LDL goal <100 11/02/2016  . Constipation in male 11/02/2016  . Insomnia due to anxiety and fear 11/02/2016  . Colon cancer screening 05/04/2015  . Vitamin D deficiency 05/02/2015  . Medicare annual wellness visit, subsequent 06/23/2013  . Essential hypertension, benign 03/22/2013  . Osteoarthritis 03/22/2013  . Numbness and tingling in left hand 03/22/2013  . GERD (gastroesophageal reflux disease) 03/22/2013  . BPH (benign prostatic hyperplasia) 03/22/2013    Cameron Sprang, PT, MPT [facility] [address] [place], [place], [address] Phone: [phone]   Fax:  [phone] 01/12/18, 4:08 PM  Name: RAYMIE GIAMMARCO MRN: [id number] Date of Birth: May 23, 1936

## 2018-01-13 ENCOUNTER — Telehealth: Payer: Self-pay | Admitting: *Deleted

## 2018-01-13 NOTE — Telephone Encounter (Signed)
Faxed signed orders to [employer] re: [employer]ioness functional neuromuscular stimulator physician statement of medical necessity/prescription form. Dx code: I48.50. Fax: [phone]. Received fax confirmation.

## 2018-01-15 ENCOUNTER — Encounter: Payer: Self-pay | Admitting: Rehabilitation

## 2018-01-15 ENCOUNTER — Other Ambulatory Visit: Payer: Self-pay | Admitting: Adult Health

## 2018-01-15 ENCOUNTER — Encounter: Payer: Medicare Other | Admitting: Occupational Therapy

## 2018-01-15 ENCOUNTER — Telehealth: Payer: Self-pay | Admitting: Rehabilitation

## 2018-01-15 ENCOUNTER — Ambulatory Visit: Payer: Medicare Other | Admitting: Rehabilitation

## 2018-01-15 DIAGNOSIS — I639 Cerebral infarction, unspecified: Secondary | ICD-10-CM

## 2018-01-15 DIAGNOSIS — R2681 Unsteadiness on feet: Secondary | ICD-10-CM

## 2018-01-15 DIAGNOSIS — I69351 Hemiplegia and hemiparesis following cerebral infarction affecting right dominant side: Secondary | ICD-10-CM

## 2018-01-15 DIAGNOSIS — M6281 Muscle weakness (generalized): Secondary | ICD-10-CM

## 2018-01-15 DIAGNOSIS — R2689 Other abnormalities of gait and mobility: Secondary | ICD-10-CM

## 2018-01-15 DIAGNOSIS — R293 Abnormal posture: Secondary | ICD-10-CM

## 2018-01-15 DIAGNOSIS — I6350 Cerebral infarction due to unspecified occlusion or stenosis of unspecified cerebral artery: Secondary | ICD-10-CM

## 2018-01-15 NOTE — Telephone Encounter (Signed)
Hi, Anthony Allen Please see order [name] has placed order R LE AFO brace . Order is under referral's . Thanks Hinton Dyer.

## 2018-01-15 NOTE — Telephone Encounter (Signed)
Anthony Allen,   I am seeing Anthony Allen at [facility] neuro for PT.  As you know, we are trying to get him a home [employer] unit for functional estim on RLE.  Unfortunately, these are usually denied, therefore I began trialing AFOs with him this week.  He did great with a R blue rocker AFO and he is able to ambulate at S level with quad tip cane. I would like to pursue this in addition to [employer].  Please write order for R LE AFO in Epic work que and I can set up appt with [employer] to get ordered.    Thanks,  Cameron Sprang, PT, MPT [facility] [address] [place], [place], [address] Phone: [phone]   Fax:  [phone] 01/15/18, 3:29 PM

## 2018-01-15 NOTE — Therapy (Signed)
[facility] [address] [place], [place], [address] Phone: [phone]   Fax:  [phone]  Physical Therapy Treatment  Patient Details  Name: Anthony Allen MRN: [id number] Date of Birth: 12/04/1935 Referring Provider: Venancio Poisson   Encounter Date: 01/15/2018  PT End of Session - 01/15/18 1541    Visit Number  22    Number of Visits  25    Date for PT Re-Evaluation  70/17/79   recertified for 60 days but anticipate D/C at 4 wks   Authorization Type  [employer] Medicare - 10th visit PN     PT Start Time  1403    PT Stop Time  1448    PT Time Calculation (min)  45 min    Equipment Utilized During Treatment  Gait belt    Activity Tolerance  Patient tolerated treatment well    Behavior During Therapy  [facility] for tasks assessed/performed       Past Medical History:  Diagnosis Date  . Arthritis    "mild in my hands" (06/04/2017)  . Complication of anesthesia    "was told never to use Propofol because of parent's adverse reaction" (06/04/2017)  . CVA (cerebral vascular accident) ([place])    hospitalized from 12/22-12/24 due to left-sided weakness, slurred speech and difficulty swallowing. He was diagnosed as left pontine stroke./notes 06/03/2017  . Family history of adverse reaction to anesthesia    "mother and dad had allergic reaction Propofol" (06/04/2017)  . Glaucoma, both eyes   . Gout   . Hypertension   . Parotid mass   . Type 2 diabetes mellitus with hyperglycemia [facility])     Past Surgical History:  Procedure Laterality Date  . TONSILLECTOMY  1943    There were no vitals filed for this visit.  Subjective Assessment - 01/15/18 1406    Subjective  No changes since last visit, had visitors last night and reports increased fatigue today.     Patient is accompained by:  Family member    Pertinent History  glaucoma    Limitations  House hold activities;Walking;Standing    Patient Stated Goals  "walk better."      Currently in Pain?  No/denies                       [facility] Adult PT Treatment/Exercise - 01/15/18 0001      Ambulation/Gait   Ambulation/Gait  Yes    Ambulation/Gait Assistance  5: Supervision;4: Min guard    Ambulation/Gait Assistance Details  Continue to assess appropriateness of AFOs during our session.  Began with R ottobock reaction AFO.  Again, note that due to brace strut running medially, this caused increased pain due to pts foot maintaining a pronated position.  Then trialed use of R blue rocker AFO and had pt ambulate x 350' with quad tiip cane and she is able to ambulate at S level during entire session with only single slight R foot catch, however he did not lose balance.  See self care for discussion with pt and wife regarding brace but PT to request order for AFO from MD and pursue this brace in addition to Bioness request.      Ambulation Distance (Feet)  115 Feet   350', 150'   Assistive device  Straight cane   with quad tip   Gait Pattern  Step-through pattern;Decreased arm swing - right;Decreased dorsiflexion - right;Decreased weight shift to right;Trunk flexed;Narrow base of support;Poor foot clearance -  right;Right foot flat;Decreased step length - left;Right flexed knee in stance    Ambulation Surface  Level;Indoor      Self-Care   Self-Care  Other Self-Care Comments    Other Self-Care Comments   Had long discussion with pt and wife regarding Bioness and brace and how this impacts POC.  Note that PT plans to keep next week's visits and pursue AFO.  Eduated that once brace is ordered it will take two weeks for brace to get in.  PT plans to place pt on hold for this period and then have him return once he has brace and ensure proper fitting and proper ability to don/doff brace/shoe.  Also educated that the same would apply if approved for Bioness.  PT would keep on hold until they get home unit and then have pt return for set up and education on use of home  unit.  Discussed that we would not likely do another 4 weeks, but rather 1-2 weeks to ensure proper use of brace (may need longer if approved for Bioness).  Pt and wife verbalized understanding.       Neuro Re-ed    Neuro Re-ed Details   Continue NMR tasks with single UE support to challenge balance and also work to improve R LE hamstring activation.  Had pt ambulate forward in tandem stance x 4 reps of 10' and backwards gait x 4 reps of 10'.  Cues for posture and decreased UE support. Ended with pt standing on rocker board vertically biased maintaining balance x 2 sets of 20 secs with good control but decreased RLE weight bearing, therefore biased board horizontally and note increased L lateral weight shift and R knee flexion, therefore had pt remove UE support and provided cues for increased R knee extension, right hip extension along with upright posture and pt did very well maintaiing midline posture.               PT Education - 01/15/18 1540    Education provided  Yes    Education Details  see self care    Person(s) Educated  Patient;Spouse    Methods  Explanation    Comprehension  Verbalized understanding       PT Short Term Goals - 12/01/17 2019      PT SHORT TERM GOAL #1   Title  Pt/wife will initiate HEP in order to indicate improved functional mobility (Target date: 11/23/17)    Time  4    Period  Weeks    Status  New      PT SHORT TERM GOAL #2   Title  Pt will improve TUG to </=33 secs w/ LRAD at S level in order to indicate decreased fall risk.      Baseline  21.94 secs with RW 11/20/17    Time  4    Period  Weeks    Status  Achieved      PT SHORT TERM GOAL #3   Title  Will assess BERG and improve score by 3 points from baseline in order to indicate decreased fall risk.     Baseline  41 at baseline, 39 on 11/20/17    Time  4    Period  Weeks    Status  Not Met      PT SHORT TERM GOAL #4   Title  Pt will improve 5TSS to </= 21 secs with single UE support only in  order to indicate decreased fall risk and improved functional strength.  Baseline  11.81 secs with single UE support 11/20/17    Time  4    Period  Weeks    Status  Achieved      PT SHORT TERM GOAL #5   Title  Pt will improve gait speed to 1.94 ft/sec w/ LRAD at S level in order to indicate decreased fall risk.      Baseline  1.62 ft/sec when cued "walk your normal pace" and 2.77 ft/sec when cued to speed up slightly.  Feel that he is able to ambulate safely at a speed somewhere  between these two speeds.  11/20/17    Time  4    Period  Weeks    Status  Partially Met      PT SHORT TERM GOAL #6   Title  Pt will ambulate short household distances (up to 50') w/ quad cane at S level in order to indicate improved independence at home.     Baseline  requires intermittent min/guard (assessed with both SBQC and quad tip cane and recommend he continue with quad tip cane. )    Time  4    Period  Weeks    Status  Partially Met        PT Long Term Goals - 12/29/17 1326      PT LONG TERM GOAL #1   Title  Pt/wife will be independent with final HEP in order to indicate improved functional mobility and decreased fall risk. (Target Date: 01/28/18)    Time  4    Period  Weeks    Status  On-going    Target Date  01/28/18      PT LONG TERM GOAL #2   Title  Pt will improve BERG balance score to 49/56 in order to indicate decreased fall risk.     Baseline  41 > 45/56 improved by 4 points    Time  4    Period  Weeks    Status  Revised      PT LONG TERM GOAL #3   Title  Pt will improve TUG time to </=18 secs w/ quad tip cane at mod I level in order to indicate decreased fall risk.      Baseline  17 with RW, 21 with cane    Time  8    Period  Weeks    Status  Revised      PT LONG TERM GOAL #4   Title  Pt will improve 5TSS to </=15 secs without UE support in order to indicate improved functional strength and decreased fall risk.      Baseline  13.79 seconds without UE support    Time  8     Period  Weeks    Status  Achieved      PT LONG TERM GOAL #5   Title  Pt will ambulate x 300' w/ LRAD over indoor surfaces at mod I level in order to indicate improved functional mobility.      Baseline  460' with supervision-min A over indoor surfaces    Time  4    Period  Weeks    Status  Revised      PT LONG TERM GOAL #6   Title  Pt will ambulate x 300' over unlevel paved outdoor surfaces w/ LRAD at S level in order to indicate improved community negotiation.      Time  4    Period  Weeks    Status  On-going  Plan - 01/15/18 1541    Clinical Impression Statement  Skilled session continued to focus on assessment of appropriate bracing for RLE.  Note that R blue rocker AFO provides most support and allowed pt to ambulate with quad tip cane at S level.  Will plan to pursue this brace and place on hold after next week until brace can come in and/or until Bioness unit comes in.      Rehab Potential  Good    Clinical Impairments Affecting Rehab Potential  Pt very motivated     PT Frequency  2x / week    PT Duration  4 weeks   but will recertify for 60 more days   PT Treatment/Interventions  ADLs/Self Care Home Management;Electrical Stimulation;DME Instruction;Gait training;Stair training;Functional mobility training;Therapeutic activities;Therapeutic exercise;Balance training;Neuromuscular re-education;Patient/family education;Orthotic Fit/Training;Passive range of motion;Vestibular    PT Next Visit Plan  Continue gait, stairs, outdoors with use of R blue rocker AFO (if you start to check his goals, I would check them with use of blue rocker and if time maybe with RW and with quad tip cane,  (R quad and R lower leg), R LE NMR (hip and knee extension, R DF), go over HEP intermittently, gait with quad tip cane, stairs with cane and no rails (to get into man cave)    Consulted and Agree with Plan of Care  Patient;Family member/caregiver    Family Member Consulted  wife Baldwin [place]  (goes by [employer])       Patient will benefit from skilled therapeutic intervention in order to improve the following deficits and impairments:  Abnormal gait, Decreased activity tolerance, Decreased balance, Decreased coordination, Decreased endurance, Decreased knowledge of use of DME, Decreased mobility, Decreased range of motion, Decreased strength, Impaired perceived functional ability, Impaired flexibility, Improper body mechanics, Postural dysfunction, Impaired UE functional use, Difficulty walking  Visit Diagnosis: Hemiplegia and hemiparesis following cerebral infarction affecting right dominant side (HCC)  Muscle weakness (generalized)  Unsteadiness on feet  Abnormal posture  Other abnormalities of gait and mobility     Problem List Patient Active Problem List   Diagnosis Date Noted  . Weight loss, unintentional 11/02/2017  . Candidiasis of scrotum 11/02/2017  . Diastolic dysfunction   . Dysphagia, post-stroke   . CKD (chronic kidney disease), stage III ([place]) 06/03/2017  . 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 Stroke (cerebrum) ([place]) 06/03/2017  . CVA (cerebral vascular accident) ([place]) 06/01/2017  . Fatigue 05/08/2017  . Aortic atherosclerosis ([place]) 05/08/2017  . Glaucoma 05/06/2017  . Lipoma of forehead 11/02/2016  . Type II diabetes mellitus with renal manifestations ([place]) 11/02/2016  . Hyperlipidemia LDL goal <100 11/02/2016  . Constipation in male 11/02/2016  . Insomnia due to anxiety and fear 11/02/2016  . Colon cancer screening 05/04/2015  . Vitamin D deficiency 05/02/2015  . Medicare annual wellness visit, subsequent 06/23/2013  . Essential hypertension, benign 03/22/2013  . Osteoarthritis 03/22/2013  . Numbness and tingling in left hand 03/22/2013  . GERD (gastroesophageal reflux disease) 03/22/2013  . BPH (benign prostatic hyperplasia) 03/22/2013    Cameron Sprang, PT, MPT [facility] [address] [place], [place], [address] Phone: [phone]   Fax:  [phone] 01/15/18, 3:45 PM  Name: Anthony Allen MRN: [id number] Date of Birth: November 28, 1935

## 2018-01-19 ENCOUNTER — Encounter: Payer: Self-pay | Admitting: Occupational Therapy

## 2018-01-19 ENCOUNTER — Encounter: Payer: Medicare Other | Admitting: Occupational Therapy

## 2018-01-19 ENCOUNTER — Ambulatory Visit: Payer: Self-pay | Admitting: Rehabilitation

## 2018-01-20 ENCOUNTER — Ambulatory Visit: Payer: Medicare Other | Admitting: Physical Therapy

## 2018-01-20 ENCOUNTER — Encounter: Payer: Self-pay | Admitting: Physical Therapy

## 2018-01-20 ENCOUNTER — Encounter: Payer: Self-pay | Admitting: Occupational Therapy

## 2018-01-20 DIAGNOSIS — R2689 Other abnormalities of gait and mobility: Secondary | ICD-10-CM

## 2018-01-20 DIAGNOSIS — I69351 Hemiplegia and hemiparesis following cerebral infarction affecting right dominant side: Secondary | ICD-10-CM

## 2018-01-20 DIAGNOSIS — M6281 Muscle weakness (generalized): Secondary | ICD-10-CM

## 2018-01-21 NOTE — Therapy (Signed)
[facility] [address] [place], [place], [address] Phone: [phone]   Fax:  [phone]  Physical Therapy Treatment  Patient Details  Name: Anthony Allen MRN: [id number] Date of Birth: 06-05-1936 Referring Provider: Venancio Poisson   Encounter Date: 01/20/2018  PT End of Session - 01/20/18 1700    Visit Number  23    Number of Visits  25    Date for PT Re-Evaluation  94/80/16   recertified for 60 days but anticipate D/C at 4 wks   Authorization Type  [employer] Medicare - 10th visit PN     PT Start Time  1451   pt late arrival   PT Stop Time  1530    PT Time Calculation (min)  39 min    Equipment Utilized During Treatment  Gait belt    Activity Tolerance  Patient tolerated treatment well    Behavior During Therapy  [facility] for tasks assessed/performed       Past Medical History:  Diagnosis Date  . Arthritis    "mild in my hands" (06/04/2017)  . Complication of anesthesia    "was told never to use Propofol because of parent's adverse reaction" (06/04/2017)  . CVA (cerebral vascular accident) ([place])    hospitalized from 12/22-12/24 due to left-sided weakness, slurred speech and difficulty swallowing. He was diagnosed as left pontine stroke./notes 06/03/2017  . Family history of adverse reaction to anesthesia    "mother and dad had allergic reaction Propofol" (06/04/2017)  . Glaucoma, both eyes   . Gout   . Hypertension   . Parotid mass   . Type 2 diabetes mellitus with hyperglycemia [facility])     Past Surgical History:  Procedure Laterality Date  . TONSILLECTOMY  1943    There were no vitals filed for this visit.                    [facility] Adult PT Treatment/Exercise - 01/20/18 1700      Transfers   Five time sit to stand comments   --    Comments  sit to stand x 5 x2; with and without UE support      Ambulation/Gait   Ambulation/Gait Assistance  4: Min assist    Ambulation/Gait  Assistance Details  with blue rocker AFO (however in slip-on shoes, so not as supportive); imbalance to his right x 2    Ambulation Distance (Feet)  115 Feet   60 x 2   Assistive device  Straight cane   quad tip; rt AFO   Gait Pattern  Step-through pattern;Decreased arm swing - right;Decreased weight shift to right;Trunk flexed;Narrow base of support;Right foot flat;Decreased step length - left;Right flexed knee in stance;Trendelenburg   with Rt AFO   Ambulation Surface  Indoor    Stairs Assistance  4: Min guard    Stair Management Technique  One rail Left;With cane    Number of Stairs  4    Height of Stairs  6          Balance Exercises - 01/21/18 0651      Balance Exercises: Standing   SLS  Eyes open;Upper extremity support 1;3 reps;10 secs   same side UE support; focus on rt hip abdct in stance   Step Ups  Lateral;6 inch;UE support 2    Other Standing Exercises  backwards step ups with RLE; 4" x 10; 8" x 10; // bars and bil UE support  PT Education - 01/21/18 [phone]    Education Details  bring shoes with laces to next visit as would like to assess while wearing blue rocker AFO    Person(s) Educated  Patient;Spouse    Methods  Explanation    Comprehension  Verbalized understanding       PT Short Term Goals - 12/01/17 2019      PT SHORT TERM GOAL #1   Title  Pt/wife will initiate HEP in order to indicate improved functional mobility (Target date: 11/23/17)    Time  4    Period  Weeks    Status  New      PT SHORT TERM GOAL #2   Title  Pt will improve TUG to </=33 secs w/ LRAD at S level in order to indicate decreased fall risk.      Baseline  21.94 secs with RW 11/20/17    Time  4    Period  Weeks    Status  Achieved      PT SHORT TERM GOAL #3   Title  Will assess BERG and improve score by 3 points from baseline in order to indicate decreased fall risk.     Baseline  41 at baseline, 39 on 11/20/17    Time  4    Period  Weeks    Status  Not Met      PT SHORT  TERM GOAL #4   Title  Pt will improve 5TSS to </= 21 secs with single UE support only in order to indicate decreased fall risk and improved functional strength.      Baseline  11.81 secs with single UE support 11/20/17    Time  4    Period  Weeks    Status  Achieved      PT SHORT TERM GOAL #5   Title  Pt will improve gait speed to 1.94 ft/sec w/ LRAD at S level in order to indicate decreased fall risk.      Baseline  1.62 ft/sec when cued "walk your normal pace" and 2.77 ft/sec when cued to speed up slightly.  Feel that he is able to ambulate safely at a speed somewhere  between these two speeds.  11/20/17    Time  4    Period  Weeks    Status  Partially Met      PT SHORT TERM GOAL #6   Title  Pt will ambulate short household distances (up to 50') w/ quad cane at S level in order to indicate improved independence at home.     Baseline  requires intermittent min/guard (assessed with both SBQC and quad tip cane and recommend he continue with quad tip cane. )    Time  4    Period  Weeks    Status  Partially Met        PT Long Term Goals - 01/20/18 1700      PT LONG TERM GOAL #1   Title  Pt/wife will be independent with final HEP in order to indicate improved functional mobility and decreased fall risk. (Target Date: 01/28/18)    Time  4    Period  Weeks    Status  On-going      PT LONG TERM GOAL #2   Title  Pt will improve BERG balance score to 49/56 in order to indicate decreased fall risk.     Baseline  41 > 45/56 improved by 4 points    Time  4  Period  Weeks    Status  Revised      PT LONG TERM GOAL #3   Title  Pt will improve TUG time to </=18 secs w/ quad tip cane at mod I level in order to indicate decreased fall risk.      Baseline  17 with RW, 21 with cane    Time  8    Period  Weeks    Status  Revised      PT LONG TERM GOAL #4   Title  Pt will improve 5TSS to </=15 secs without UE support in order to indicate improved functional strength and decreased fall risk.       Baseline  13.79 seconds without UE support    Time  8    Period  Weeks    Status  Achieved      PT LONG TERM GOAL #5   Title  Pt will ambulate x 300' w/ LRAD over indoor surfaces at mod I level in order to indicate improved functional mobility.      Baseline  460' with supervision-min A over indoor surfaces    Time  4    Period  Weeks    Status  Revised      PT LONG TERM GOAL #6   Title  Pt will ambulate x 300' over unlevel paved outdoor surfaces w/ LRAD at S level in order to indicate improved community negotiation.      Time  4    Period  Weeks    Status  On-going            Plan - 01/21/18 0640    Clinical Impression Statement  Session began to address STGs however pt did not have appropriate shoes to wear blue rocker AFO (slip ons, no laces) therefore only assessed 5x sit to stand (which pt met). Gait training with [place] with quad tip with continued significant rt trendelenberg. Ther-ex to address hip weakness and balance. Patient/wife aware to wear lace up shoes to next session for assessment of LTGs.     Rehab Potential  Good    Clinical Impairments Affecting Rehab Potential  Pt very motivated     PT Frequency  2x / week    PT Duration  4 weeks   but will recertify for 60 more days   PT Treatment/Interventions  ADLs/Self Care Home Management;Electrical Stimulation;DME Instruction;Gait training;Stair training;Functional mobility training;Therapeutic activities;Therapeutic exercise;Balance training;Neuromuscular re-education;Patient/family education;Orthotic Fit/Training;Passive range of motion;Vestibular    PT Next Visit Plan  check LTGs; Continue gait, stairs, outdoors with use of R blue rocker AFO (if you start to check his goals, I would check them with use of blue rocker and if time maybe with RW and with quad tip cane,  (R quad and R lower leg), R LE NMR (hip and knee extension, R DF), go over HEP intermittently, gait with quad tip cane, stairs with cane and no rails (to  get into man cave)    Consulted and Agree with Plan of Care  Patient;Family member/caregiver    Family Member Consulted  wife Baldwin [place] (goes by [employer])       Patient will benefit from skilled therapeutic intervention in order to improve the following deficits and impairments:  Abnormal gait, Decreased activity tolerance, Decreased balance, Decreased coordination, Decreased endurance, Decreased knowledge of use of DME, Decreased mobility, Decreased range of motion, Decreased strength, Impaired perceived functional ability, Impaired flexibility, Improper body mechanics, Postural dysfunction, Impaired UE functional use, Difficulty walking  Visit  Diagnosis: Hemiplegia and hemiparesis following cerebral infarction affecting right dominant side (HCC)  Muscle weakness (generalized)  Other abnormalities of gait and mobility     Problem List Patient Active Problem List   Diagnosis Date Noted  . Weight loss, unintentional 11/02/2017  . Candidiasis of scrotum 11/02/2017  . Diastolic dysfunction   . Dysphagia, post-stroke   . CKD (chronic kidney disease), stage III ([place]) 06/03/2017  . 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 Stroke (cerebrum) ([place]) 06/03/2017  . CVA (cerebral vascular accident) ([place]) 06/01/2017  . Fatigue 05/08/2017  . Aortic atherosclerosis ([place]) 05/08/2017  . Glaucoma 05/06/2017  . Lipoma of forehead 11/02/2016  . Type II diabetes mellitus with renal manifestations ([place]) 11/02/2016  . Hyperlipidemia LDL goal <100 11/02/2016  . Constipation in male 11/02/2016  . Insomnia due to anxiety and fear 11/02/2016  . Colon cancer screening 05/04/2015  . Vitamin D deficiency 05/02/2015  . Medicare annual wellness visit, subsequent 06/23/2013  . Essential hypertension, benign 03/22/2013  . Osteoarthritis 03/22/2013  . Numbness and tingling in left hand 03/22/2013  . GERD (gastroesophageal reflux disease) 03/22/2013  . BPH (benign prostatic hyperplasia) 03/22/2013    Rexanne Mano, PT 01/21/2018, 6:55 AM  [facility] [address] [place], [place], [address] Phone: [phone]   Fax:  [phone]  Name: HAKIEM MALIZIA MRN: [id number] Date of Birth: Nov 05, 1935

## 2018-01-22 ENCOUNTER — Ambulatory Visit: Payer: Medicare Other | Admitting: Rehabilitation

## 2018-01-22 ENCOUNTER — Telehealth: Payer: Self-pay

## 2018-01-22 ENCOUNTER — Encounter: Payer: Medicare Other | Admitting: Occupational Therapy

## 2018-01-22 ENCOUNTER — Encounter: Payer: Self-pay | Admitting: Rehabilitation

## 2018-01-22 DIAGNOSIS — R2681 Unsteadiness on feet: Secondary | ICD-10-CM

## 2018-01-22 DIAGNOSIS — R2689 Other abnormalities of gait and mobility: Secondary | ICD-10-CM

## 2018-01-22 DIAGNOSIS — I69351 Hemiplegia and hemiparesis following cerebral infarction affecting right dominant side: Secondary | ICD-10-CM | POA: Diagnosis not present

## 2018-01-22 DIAGNOSIS — R293 Abnormal posture: Secondary | ICD-10-CM

## 2018-01-22 DIAGNOSIS — M6281 Muscle weakness (generalized): Secondary | ICD-10-CM

## 2018-01-22 NOTE — Therapy (Signed)
[facility] [address] [place], [place], [address] Phone: [phone]   Fax:  [phone]  Physical Therapy Treatment and D/C Summary   Patient Details  Name: Anthony Allen MRN: [id number] Date of Birth: 01-Dec-1935 Referring Provider: Venancio Poisson   Encounter Date: 01/22/2018  PT End of Session - 01/22/18 1523    Visit Number  24    Number of Visits  25    Date for PT Re-Evaluation  71/69/67   recertified for 60 days but anticipate D/C at 4 wks   Authorization Type  [employer] Medicare - 10th visit PN     PT Start Time  1320    PT Stop Time  1403    PT Time Calculation (min)  43 min    Equipment Utilized During Treatment  Gait belt    Activity Tolerance  Patient tolerated treatment well    Behavior During Therapy  [facility] for tasks assessed/performed       Past Medical History:  Diagnosis Date  . Arthritis    "mild in my hands" (06/04/2017)  . Complication of anesthesia    "was told never to use Propofol because of parent's adverse reaction" (06/04/2017)  . CVA (cerebral vascular accident) ([place])    hospitalized from 12/22-12/24 due to left-sided weakness, slurred speech and difficulty swallowing. He was diagnosed as left pontine stroke./notes 06/03/2017  . Family history of adverse reaction to anesthesia    "mother and dad had allergic reaction Propofol" (06/04/2017)  . Glaucoma, both eyes   . Gout   . Hypertension   . Parotid mass   . Type 2 diabetes mellitus with hyperglycemia [facility])     Past Surgical History:  Procedure Laterality Date  . TONSILLECTOMY  1943    There were no vitals filed for this visit.  Subjective Assessment - 01/22/18 1517    Subjective  Pt reports no changes since last visit.     Patient is accompained by:  Family member    Pertinent History  glaucoma    Limitations  House hold activities;Walking;Standing    Patient Stated Goals  "walk better."     Currently in Pain?   No/denies                       [facility] Adult PT Treatment/Exercise - 01/22/18 1356      Transfers   Transfers  Sit to Stand;Stand to Sit    Sit to Stand  6: Modified independent (Device/Increase time)    Five time sit to stand comments   18 secs today, had some difficulty due to brace    Stand to Sit  6: Modified independent (Device/Increase time)      Ambulation/Gait   Ambulation/Gait  Yes    Ambulation/Gait Assistance  6: Modified independent (Device/Increase time);5: Supervision    Ambulation/Gait Assistance Details  Pt able to perform gait indoors x 300' with quad tip cane and blue rocker AFO on RLE at distant S to mod I level.  Pt is mod I with use of RW.  Pt needed min cues for negotiation through tight spaces to turn sideways for safer negotiation.  We did not have time to address outdoor gait today, however feel that pt is safe to traverse unlevel paved surfaces with RW at mod I level however did encourage him to begin using quad tip cane with wife is going to areas that are paved, level and not too crowded with assist from [place] (  wife as needed) once he has his own brace.      Ambulation Distance (Feet)  300 Feet    Assistive device  Straight cane   with quad tip cane   Gait Pattern  Step-through pattern;Decreased arm swing - right;Decreased weight shift to right;Trunk flexed;Narrow base of support;Right foot flat;Decreased step length - left;Right flexed knee in stance;Trendelenburg    Ambulation Surface  Level;Indoor      Standardized Balance Assessment   Standardized Balance Assessment  Berg Balance Test;Timed Up and Go Test      Berg Balance Test   Sit to Stand  Able to stand without using hands and stabilize independently    Standing Unsupported  Able to stand safely 2 minutes    Sitting with Back Unsupported but Feet Supported on Floor or Stool  Able to sit safely and securely 2 minutes    Stand to Sit  Sits safely with minimal use of hands    Transfers   Able to transfer safely, minor use of hands    Standing Unsupported with Eyes Closed  Able to stand 10 seconds safely    Standing Ubsupported with Feet Together  Able to place feet together independently and stand 1 minute safely    From Standing, Reach Forward with Outstretched Arm  Can reach forward >12 cm safely (5")    From Standing Position, Pick up Object from Floor  Able to pick up shoe, needs supervision    From Standing Position, Turn to Look Behind Over each Shoulder  Looks behind from both sides and weight shifts well    Turn 360 Degrees  Needs close supervision or verbal cueing    Standing Unsupported, Alternately Place Feet on Step/Stool  Able to complete >2 steps/needs minimal assist    Standing Unsupported, One Foot in Front  Able to plae foot ahead of the other independently and hold 30 seconds    Standing on One Leg  Tries to lift leg/unable to hold 3 seconds but remains standing independently    Total Score  44      Timed Up and Go Test   TUG  Normal TUG    Normal TUG (seconds)  14.09      Self-Care   Self-Care  Other Self-Care Comments    Other Self-Care Comments   Discussed with pt that PT followed up on brace order this morning.  [facility] reports that they had not gotten original order, therefore PT faxed again early this morning and felt like they would likely call pt this afternoon in order to follow up.  Discussed that they may have pt come in for initial consult and then order, but that once brace is ordered it takes approx 2 weeks to get.  Educated on brace wear schedule that he should eventually build up to wearing brace all of his awake hours and that once he has brace, using quad tip cane in the house, instead of RW.  Pt and wife verbalized understanding.  Discussed that if he had any further questions regarding brace or needing to return to therapy in the future, to contact us.               PT Education - 01/22/18 1523    Education provided  Yes    Education  Details  see self care    Person(s) Educated  Patient;Spouse    Methods  Explanation    Comprehension  Verbalized understanding       PT Short Term  Goals - 12/01/17 2019      PT SHORT TERM GOAL #1   Title  Pt/wife will initiate HEP in order to indicate improved functional mobility (Target date: 11/23/17)    Time  4    Period  Weeks    Status  New      PT SHORT TERM GOAL #2   Title  Pt will improve TUG to </=33 secs w/ LRAD at S level in order to indicate decreased fall risk.      Baseline  21.94 secs with RW 11/20/17    Time  4    Period  Weeks    Status  Achieved      PT SHORT TERM GOAL #3   Title  Will assess BERG and improve score by 3 points from baseline in order to indicate decreased fall risk.     Baseline  41 at baseline, 39 on 11/20/17    Time  4    Period  Weeks    Status  Not Met      PT SHORT TERM GOAL #4   Title  Pt will improve 5TSS to </= 21 secs with single UE support only in order to indicate decreased fall risk and improved functional strength.      Baseline  11.81 secs with single UE support 11/20/17    Time  4    Period  Weeks    Status  Achieved      PT SHORT TERM GOAL #5   Title  Pt will improve gait speed to 1.94 ft/sec w/ LRAD at S level in order to indicate decreased fall risk.      Baseline  1.62 ft/sec when cued "walk your normal pace" and 2.77 ft/sec when cued to speed up slightly.  Feel that he is able to ambulate safely at a speed somewhere  between these two speeds.  11/20/17    Time  4    Period  Weeks    Status  Partially Met      PT SHORT TERM GOAL #6   Title  Pt will ambulate short household distances (up to 50') w/ quad cane at S level in order to indicate improved independence at home.     Baseline  requires intermittent min/guard (assessed with both SBQC and quad tip cane and recommend he continue with quad tip cane. )    Time  4    Period  Weeks    Status  Partially Met        PT Long Term Goals - 01/22/18 1337      PT LONG TERM  GOAL #1   Title  Pt/wife will be independent with final HEP in order to indicate improved functional mobility and decreased fall risk. (Target Date: 01/28/18)    Baseline  met per report 01/22/18    Time  4    Period  Weeks    Status  Achieved      PT LONG TERM GOAL #2   Title  Pt will improve BERG balance score to 49/56 in order to indicate decreased fall risk.     Baseline  41 > 45/56 improved by 4 points, 44/56 on 01/22/18    Time  4    Period  Weeks    Status  Not Met      PT LONG TERM GOAL #3   Title  Pt will improve TUG time to </=18 secs w/ quad tip cane at mod I level in order to indicate  decreased fall risk.      Baseline  17 with RW, 21 with cane, 14.09 secs with quad tip cane on 01/22/18    Time  8    Period  Weeks    Status  Achieved      PT LONG TERM GOAL #4   Title  Pt will improve 5TSS to </=15 secs without UE support in order to indicate improved functional strength and decreased fall risk.      Baseline  13.79 seconds without UE support    Time  8    Period  Weeks    Status  Achieved      PT LONG TERM GOAL #5   Title  Pt will ambulate x 300' w/ LRAD over indoor surfaces at mod I level in order to indicate improved functional mobility.      Baseline  460' with supervision-min A over indoor surfaces, met with RW 01/22/18, S to mod I with quad tip cane     Time  4    Period  Weeks    Status  Achieved      PT LONG TERM GOAL #6   Title  Pt will ambulate x 300' over unlevel paved outdoor surfaces w/ LRAD at S level in order to indicate improved community negotiation.     with RW   Time  4    Period  Weeks    Status  Achieved            Plan - 01/22/18 1523    Clinical Impression Statement  Skilled session focused on assessment of LTGs.  Pt has met 5/6 LTGs, not meeting BERG goal with a 1 point decline from last assessment.  Feel that this may be due to wearing blue rocker AFO and pt needing to have time to adjust balance while wearing as brace is stiff and  allows less free movement.  Educated on brace order process and that [employer] should be contacting them., but that they can contact us at any time with questions.  Pt ready for D/c.     Rehab Potential  Good    Clinical Impairments Affecting Rehab Potential  Pt very motivated     PT Frequency  2x / week    PT Duration  4 weeks   but will recertify for 60 more days   PT Treatment/Interventions  ADLs/Self Care Home Management;Electrical Stimulation;DME Instruction;Gait training;Stair training;Functional mobility training;Therapeutic activities;Therapeutic exercise;Balance training;Neuromuscular re-education;Patient/family education;Orthotic Fit/Training;Passive range of motion;Vestibular    Consulted and Agree with Plan of Care  Patient;Family member/caregiver    Family Member Consulted  wife [place] (goes by [employer])       Patient will benefit from skilled therapeutic intervention in order to improve the following deficits and impairments:  Abnormal gait, Decreased activity tolerance, Decreased balance, Decreased coordination, Decreased endurance, Decreased knowledge of use of DME, Decreased mobility, Decreased range of motion, Decreased strength, Impaired perceived functional ability, Impaired flexibility, Improper body mechanics, Postural dysfunction, Impaired UE functional use, Difficulty walking  Visit Diagnosis: Hemiplegia and hemiparesis following cerebral infarction affecting right dominant side (HCC)  Muscle weakness (generalized)  Other abnormalities of gait and mobility  Unsteadiness on feet  Abnormal posture  PHYSICAL THERAPY DISCHARGE SUMMARY  Visits from Start of Care: 24  Current functional level related to goals / functional outcomes: See LTGs above   Remaining deficits: Pt continues to have R hemiplegia and R inattention leading to balance deficits.  Has HEP to address and will be getting R  blue rocker AFO in order to improve R foot clearance and decrease fall risk.     Education / Equipment: HEP, blue rocker AFO   Plan: Patient agrees to discharge.  Patient goals were partially met. Patient is being discharged due to meeting the stated rehab goals.  ?????        Problem List Patient Active Problem List   Diagnosis Date Noted  . Weight loss, unintentional 11/02/2017  . Candidiasis of scrotum 11/02/2017  . Diastolic dysfunction   . Dysphagia, post-stroke   . CKD (chronic kidney disease), stage III ([place]) 06/03/2017  . 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 Stroke (cerebrum) ([place]) 06/03/2017  . CVA (cerebral vascular accident) ([place]) 06/01/2017  . Fatigue 05/08/2017  . Aortic atherosclerosis ([place]) 05/08/2017  . Glaucoma 05/06/2017  . Lipoma of forehead 11/02/2016  . Type II diabetes mellitus with renal manifestations ([place]) 11/02/2016  . Hyperlipidemia LDL goal <100 11/02/2016  . Constipation in male 11/02/2016  . Insomnia due to anxiety and fear 11/02/2016  . Colon cancer screening 05/04/2015  . Vitamin D deficiency 05/02/2015  . Medicare annual wellness visit, subsequent 06/23/2013  . Essential hypertension, benign 03/22/2013  . Osteoarthritis 03/22/2013  . Numbness and tingling in left hand 03/22/2013  . GERD (gastroesophageal reflux disease) 03/22/2013  . BPH (benign prostatic hyperplasia) 03/22/2013    Cameron Sprang, PT, MPT [facility] [address] [place], [place], [address] Phone: [phone]   Fax:  [phone] 01/22/18, 3:27 PM  Name: BRADIE LACOCK MRN: [id number] Date of Birth: 10/15/1935

## 2018-01-22 NOTE — Telephone Encounter (Signed)
[employer] functional neuromuscular stimulator physician statement of medical necessity form filled out by Philis Pique and Raquel Sarna physical therapist at neuro rehab. Form fax to  [phone]. Form fax twice and receive. Contact number is [phone] extension is [phone] for contact person [place].

## 2018-01-26 ENCOUNTER — Other Ambulatory Visit: Payer: Self-pay | Admitting: Internal Medicine

## 2018-02-20 NOTE — Telephone Encounter (Signed)
RN and [name] NP receive denial letter for neuromuscular electrical stimulation from [employer]. This was recommend by patients therapist Raquel Sarna. The letter states patient does not meet this criteria:  1. You must have weakness cause by a spinal cord injury.  2. This device has not been proven to be helpful for your condition. The request cannot be approve at this time by Medicare or your health plan. This decision is base on national coverage determination for neuromuscular electrical stimulation.

## 2018-02-27 ENCOUNTER — Telehealth: Payer: Self-pay | Admitting: Internal Medicine

## 2018-02-27 NOTE — Telephone Encounter (Signed)
Copied from [place] [phone] [id number]. Topic: Inquiry >> Feb 27, 2018  3:23 PM Margot Ables wrote: Reason for CRM: Pt received a jury summons and Mrs. Davonna Belling is requesting a letter to have him made exempt from jury duty due to the stroke that he had before Christmas 2018. She states pt is impaired and not capable of sitting on a jury. Will Dr. Derrel Nip write a letter. Please call to advise.

## 2018-02-27 NOTE — Telephone Encounter (Signed)
Have tried calling patient twice no answer, busy signal, patient has no mobile number listed. Will continue to call.

## 2018-02-27 NOTE — Telephone Encounter (Signed)
Yes, I will write a letter for Mr Anthony Allen.  Please send this back to me

## 2018-03-01 NOTE — Telephone Encounter (Signed)
Letter in support of waiving jury duty written;  If not on printer. Please print

## 2018-03-02 ENCOUNTER — Telehealth: Payer: Self-pay

## 2018-03-02 ENCOUNTER — Other Ambulatory Visit: Payer: Self-pay | Admitting: Internal Medicine

## 2018-03-02 NOTE — Telephone Encounter (Signed)
Pt's wife called and stated that pt has been summons for jury duty and is wanting to get a letter to exempt him from it. Pt has had a stroke and impaired and not capable of sitting through court per pt's wife.

## 2018-03-02 NOTE — Telephone Encounter (Signed)
LMTCB. Need to let pt's wife know that letter has been completed and placed up front for them to come by and pick up. PEC may speak with pt's wife.

## 2018-03-02 NOTE — Telephone Encounter (Signed)
Copied from [place] [id number]. Topic: Inquiry >> Feb 27, 2018  3:23 PM Margot Ables wrote: Reason for CRM: Pt received a jury summons and Mrs. Davonna Belling is requesting a letter to have him made exempt from jury duty due to the stroke that he had before Christmas 2018. She states pt is impaired and not capable of sitting on a jury. Will Dr. Derrel Nip write a letter. Please call to advise. >> Feb 27, 2018  3:33 PM Carolyn Stare wrote:    Wife cal lto say cancel this request he does need the letter after all

## 2018-03-03 NOTE — Telephone Encounter (Signed)
Spoke with pt's wife to let her know that the letter has been completed and placed up front for pick up. Wife gave a verbal understanding.

## 2018-03-09 ENCOUNTER — Other Ambulatory Visit: Payer: Self-pay

## 2018-03-09 ENCOUNTER — Other Ambulatory Visit (INDEPENDENT_AMBULATORY_CARE_PROVIDER_SITE_OTHER): Payer: Medicare Other

## 2018-03-09 DIAGNOSIS — E785 Hyperlipidemia, unspecified: Secondary | ICD-10-CM

## 2018-03-09 DIAGNOSIS — E1129 Type 2 diabetes mellitus with other diabetic kidney complication: Secondary | ICD-10-CM

## 2018-03-09 LAB — COMPREHENSIVE METABOLIC PANEL
ALBUMIN: 3.9 g/dL (ref 3.5–5.2)
ALT: 41 U/L (ref 0–53)
AST: 28 U/L (ref 0–37)
Alkaline Phosphatase: 78 U/L (ref 39–117)
BUN: 29 mg/dL — ABNORMAL HIGH (ref 6–23)
CALCIUM: 9.7 mg/dL (ref 8.4–10.5)
CHLORIDE: 104 meq/L (ref 96–112)
CO2: 28 mEq/L (ref 19–32)
CREATININE: 1.38 mg/dL (ref 0.40–1.50)
GFR: 52.69 mL/min — ABNORMAL LOW (ref >60.00)
Glucose, Bld: 136 mg/dL — ABNORMAL HIGH (ref 70–99)
Potassium: 4.1 mEq/L (ref 3.5–5.1)
Sodium: 139 mEq/L (ref 135–145)
Total Bilirubin: 1 mg/dL (ref 0.2–1.2)
Total Protein: 6.7 g/dL (ref 6.0–8.3)

## 2018-03-09 LAB — LIPID PANEL
CHOLESTEROL: 89 mg/dL (ref 0–200)
HDL: 34 mg/dL — ABNORMAL LOW (ref >39.00)
LDL Cholesterol: 24 mg/dL (ref 0–99)
NonHDL: 55.37
TRIGLYCERIDES: 155 mg/dL — AB (ref 0.0–149.0)
Total CHOL/HDL Ratio: 3
VLDL: 31 mg/dL (ref 0.0–40.0)

## 2018-03-09 LAB — HEMOGLOBIN A1C: HEMOGLOBIN A1C: 6.2 % (ref 4.6–6.5)

## 2018-03-11 ENCOUNTER — Encounter: Payer: Self-pay | Admitting: Internal Medicine

## 2018-03-11 ENCOUNTER — Ambulatory Visit: Payer: Medicare Other | Admitting: Internal Medicine

## 2018-03-11 VITALS — BP 100/64 | HR 82 | Temp 97.9°F | Resp 16 | Ht 69.0 in | Wt 147.0 lb

## 2018-03-11 DIAGNOSIS — I6930 Unspecified sequelae of cerebral infarction: Secondary | ICD-10-CM

## 2018-03-11 DIAGNOSIS — N183 Chronic kidney disease, stage 3 unspecified: Secondary | ICD-10-CM

## 2018-03-11 DIAGNOSIS — E1129 Type 2 diabetes mellitus with other diabetic kidney complication: Secondary | ICD-10-CM

## 2018-03-11 DIAGNOSIS — Z1211 Encounter for screening for malignant neoplasm of colon: Secondary | ICD-10-CM

## 2018-03-11 NOTE — Patient Instructions (Signed)
Your diabetes remains under excellent control currently!  Continue the Jardiance as long as you are tolerating it.  your cholesterol and other labs are also normal, but I would like you to drink at least 16 ounces of PURE WATER Daily FOR YOUR KIDNEYS  LET ME KNOW WHEN  YOU WANT TO RESUME ANY  HOME PHYSICAL THERAPY .  WE MIGHT HAVE TO SCHEDULE A BRIEF VISIT TO GET MEDICARE TO PAY FOR IT AT HOME    I'LL SEE YOU in 6 months for follow up on diabetes and make sure you are seeing your eye doctor at least once a year for a dilated retina exam to monitor for diabetic retinopathy,. changes that can lead to blindness .

## 2018-03-11 NOTE — Progress Notes (Signed)
Subjective:  Patient ID: Anthony Allen, male    DOB: 14-Nov-1935  Age: 82 y.o. MRN: [id number]  CC: The primary encounter diagnosis was Type 2 diabetes mellitus with other diabetic kidney complication, without long-term current use of insulin ([employer]). Diagnoses of Late effects of cerebral ischemic stroke and CKD (chronic kidney disease), stage III ([place]) were also pertinent to this visit.  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  He is accompanied by his wife Kermit Balo.  He is using a rolling walker for ambulation and Is seated in a wheelchair today . He continues to have mood lability  As a late effect of the stroke.  Right hand at risk for developing contractures but he is exercising it.  Finally gaining the weight back that he lost after the stroke due to dysphagia and depression.  Feeling more optimistic     Patient does not check blood sugars more than once a month,  Last one was 125 a month ago in a fasting state.  Dos not recall any above 200 lr less than 80.  No complaints today.  Taking his medications as directed,  Not exercising on a regular basis or trying to lose weight.  Patient voices awareness  of the foods he/she needs to avoid,  And follows a low GI diet about 50% of the time.  Has not had an annual diabetic eye exam.  He has no numbness and tingling in his feet .  Denies hypoglycemic symptoms.    Outpatient Medications Prior to Visit  Medication Sig Dispense Refill  . acetaminophen (TYLENOL) 500 MG tablet Take 500 mg by mouth every 6 (six) hours as needed.    . ALPRAZolam (XANAX) 0.5 MG tablet 30TK 1 T PO Q NIGHT SHIFT FOR ANXIETY / INSOMNIA 30 tablet 5  . Cholecalciferol (VITAMIN D-3) 1000 units CAPS Take 1,000 Units by mouth daily.    . clopidogrel (PLAVIX) 75 MG tablet TAKE 1 TABLET(75 MG) BY MOUTH DAILY  90 tablet 1  . fluticasone (FLONASE) 50 MCG/ACT nasal spray Place 2 sprays into both nostrils daily as needed for allergies or rhinitis.     Marland Kitchen gabapentin (NEURONTIN) 100 MG capsule     . HYDROCORTISONE, TOPICAL, 2 % LOTN Apply 1 application topically as needed (itching).    . JARDIANCE 10 MG TABS tablet TAKE 1 TABLET BY MOUTH EVERY DAY 90 tablet 1  . latanoprost (XALATAN) 0.005 % ophthalmic solution Instill 1 drop into both eyes in the evening  0  . losartan (COZAAR) 50 MG tablet Take 1 tablet (50 mg total) by mouth daily. 90 tablet 3  . Menthol, Topical Analgesic, (BLUE-EMU MAXIMUM STRENGTH EX) Apply topically.    . mirtazapine (REMERON) 7.5 MG tablet   0  . nystatin (NYSTATIN) powder Apply topically 2 (two) times daily. As needed for rash 60 g 2  . rosuvastatin (CRESTOR) 10 MG tablet TAKE 1 TABLET BY MOUTH EVERY DAY 90 tablet 1  . timolol (TIMOPTIC) 0.5 % ophthalmic solution Instill 1 drop into the right eye in the morning  0   No facility-administered medications prior to visit.     Review of Systems;  Patient denies headache, fevers, malaise, unintentional weight loss, skin rash, eye pain, sinus congestion and sinus pain, sore throat, dysphagia,  hemoptysis , cough, dyspnea, wheezing, chest pain, palpitations, orthopnea,  edema, abdominal pain, nausea, melena, diarrhea, constipation, flank pain, dysuria, hematuria, urinary  Frequency, nocturia, numbness, tingling, seizures,  Focal weakness, Loss of consciousness,  Tremor, insomnia, depression, anxiety, and suicidal ideation.      Objective:  BP 100/64 (BP Location: Left Arm, Patient Position: Sitting, Cuff Size: Normal)   Pulse 82   Temp 97.9 F (36.6 C) (Oral)   Resp 16   Ht 5\' 9"  (1.753 m)   Wt 147 lb (66.7 kg)   SpO2 94%   BMI 21.71 kg/m   BP Readings from Last 3 Encounters:  03/11/18 100/64  12/03/17 106/68  11/04/17 104/61    Wt Readings from Last 3 Encounters:  03/11/18 147 lb (66.7 kg)  12/03/17 138 lb (62.6 kg)    11/04/17 137 lb (62.1 kg)    General appearance: alert, cooperative and appears stated age Ears: normal TM's and external ear canals both ears Throat: lips, mucosa, and tongue normal; teeth and gums normal Neck: no adenopathy, no carotid bruit, supple, symmetrical, trachea midline and thyroid not enlarged, symmetric, no tenderness/mass/nodules Back: symmetric, no curvature. ROM normal. No CVA tenderness. Lungs: clear to auscultation bilaterally Heart: regular rate and rhythm, S1, S2 normal, no murmur, click, rub or gallop Abdomen: soft, non-tender; bowel sounds normal; no masses,  no organomegaly Pulses: 2+ and symmetric Skin: Skin color, texture, turgor normal. No rashes or lesions Lymph nodes: Cervical, supraclavicular, and axillary nodes normal.  Lab Results  Component Value Date   HGBA1C 6.2 03/09/2018   HGBA1C 5.9 12/03/2017   HGBA1C 5.7 09/01/2017    Lab Results  Component Value Date   CREATININE 1.38 03/09/2018   CREATININE 1.30 (H) 10/28/2017   CREATININE 1.26 09/01/2017    Lab Results  Component Value Date   WBC 8.9 06/03/2017   HGB 18.0 (H) 06/03/2017   HCT 53.0 (H) 06/03/2017   PLT 197 06/03/2017   GLUCOSE 136 (H) 03/09/2018   CHOL 89 03/09/2018   TRIG 155.0 (H) 03/09/2018   HDL 34.00 (L) 03/09/2018   LDLDIRECT 117.0 05/06/2017   LDLCALC 24 03/09/2018   ALT 41 03/09/2018   AST 28 03/09/2018   NA 139 03/09/2018   K 4.1 03/09/2018   CL 104 03/09/2018   CREATININE 1.38 03/09/2018   BUN 29 (H) 03/09/2018   CO2 28 03/09/2018   TSH 1.561 06/01/2017   PSA 1.52 06/23/2013   INR 1.05 06/03/2017   HGBA1C 6.2 03/09/2018   MICROALBUR <0.7 03/12/2018    Mr Face/trigeminal Wo/w Cm  Result Date: 10/28/2017 CLINICAL DATA:  Left parapharyngeal mass. EXAM: MRI FACE TRIGEMINAL WITHOUT AND WITH CONTRAST TECHNIQUE: Multiplanar, multiecho pulse sequences of the face and surrounding structures, including thin slice imaging of the course of the Trigeminal Nerves, were  obtained both before and after administration of intravenous contrast. CONTRAST:  84mL MULTIHANCE GADOBENATE DIMEGLUMINE 529 MG/ML IV SOLN COMPARISON:  Brain MRI 06/01/2017 FINDINGS: There are old infarcts of the right corona radiata and left pons. The visualized intracranial contents are otherwise unremarkable. 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 There is no extension into the skull base. The lesion measures approximately 4.0 x 0.7 x 2.3 cm. There is no other skull base abnormality. Dedicated trigeminal nerve imaging was also performed. There is no focal abnormality along the trigeminal nerves. Meckel's cave is patent bilaterally. No abnormality at the foramina rotundum or ovale. IMPRESSION: 1. 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 Histologic sampling should be considered. 2. No extension through the skull base. Normal appearance of the visible cranial nerves. 3. 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 Electronically Signed   By: Ulyses Jarred M.D.   On: 10/28/2017 15:44    Assessment & Plan:   Problem List Items Addressed This Visit    CKD (chronic kidney disease), stage III ([place])    Stable, Secondary to diabetes mellitus and hypertension.  Continue losartan. Rosuvastatin.  And avoidance of NSAIDs   Lab Results  Component Value Date   CREATININE 1.38 03/09/2018         Late effects of cerebral ischemic stroke    His right sided hemiparesis is improving with PT/OT but he remains dependent on a rolling walker and cannot feed himself with his right hand because he is unable to make a fist.  He also continues to  struggle  With mood lability,  And cannot laugh with crying.  I have supported his request to waive jury duty permanently       Type II diabetes mellitus with renal manifestations ([place]) - Primary    well-controlled on Jardiance.  hemoglobin A1c has been consistently at or  less than 7.0 . Patient is up-to-date on eye exams and foot exam is normal today except for mild loss of sensation .  Patient is due for urine microalbumin to creatinine ratio at next visit. Patient is tolerating statin therapy for CAD risk reduction and on ACE/ARB for reduction in proteinuria.  Current labs are pending   Lab Results  Component Value Date   HGBA1C 6.2 03/09/2018   Lab Results  Component Value Date   MICROALBUR <0.7 03/12/2018   No results found for: LIPASE Lab Results  Component Value Date   CHOL 89 03/09/2018   HDL 34.00 (L) 03/09/2018   LDLCALC 24 03/09/2018   LDLDIRECT 117.0 05/06/2017   TRIG 155.0 (H) 03/09/2018   CHOLHDL 3 03/09/2018        Relevant Orders   Hemoglobin A1c   Lipid panel   Comprehensive metabolic panel   Microalbumin / creatinine urine ratio (Completed)     A total of 25 minutes of face to face time was spent with patient more than half of which was spent in counselling about the above mentioned conditions  and coordination of care  I am having Anthony Allen maintain his HYDROCORTISONE (TOPICAL), latanoprost, timolol, fluticasone, losartan, Vitamin D-3, ALPRAZolam, nystatin, JARDIANCE, clopidogrel, (Menthol, Topical Analgesic, (BLUE-EMU MAXIMUM STRENGTH EX)), acetaminophen, rosuvastatin, gabapentin, and mirtazapine.  No orders of the defined types were placed in this encounter.   There are no discontinued medications.  Follow-up: Return in about 6 months (around 09/10/2018) for follow up diabetes.   Crecencio Mc, MD

## 2018-03-12 ENCOUNTER — Other Ambulatory Visit: Payer: Medicare Other

## 2018-03-12 DIAGNOSIS — E1129 Type 2 diabetes mellitus with other diabetic kidney complication: Secondary | ICD-10-CM | POA: Diagnosis not present

## 2018-03-13 LAB — MICROALBUMIN / CREATININE URINE RATIO
CREATININE, U: 60.1 mg/dL
MICROALB/CREAT RATIO: 1.2 mg/g (ref 0.0–30.0)
Microalb, Ur: <0.7 mg/dL (ref 0.0–1.9)

## 2018-03-14 ENCOUNTER — Encounter: Payer: Self-pay | Admitting: Internal Medicine

## 2018-03-14 NOTE — Assessment & Plan Note (Signed)
well-controlled on Jardiance.  hemoglobin A1c has been consistently at or  less than 7.0 . Patient is up-to-date on eye exams and foot exam is normal today except for mild loss of sensation .  Patient is due for urine microalbumin to creatinine ratio at next visit. Patient is tolerating statin therapy for CAD risk reduction and on ACE/ARB for reduction in proteinuria.  Current labs are pending   Lab Results  Component Value Date   HGBA1C 6.2 03/09/2018   Lab Results  Component Value Date   MICROALBUR <0.7 03/12/2018   No results found for: LIPASE Lab Results  Component Value Date   CHOL 89 03/09/2018   HDL 34.00 (L) 03/09/2018   LDLCALC 24 03/09/2018   LDLDIRECT 117.0 05/06/2017   TRIG 155.0 (H) 03/09/2018   CHOLHDL 3 03/09/2018

## 2018-03-14 NOTE — Assessment & Plan Note (Signed)
His right sided hemiparesis is improving with PT/OT but he remains dependent on a rolling walker and cannot feed himself with his right hand because he is unable to make a fist.  He also continues to struggle  With mood lability,  And cannot laugh with crying.  I have supported his request to waive jury duty permanently

## 2018-03-14 NOTE — Assessment & Plan Note (Signed)
Stable, Secondary to diabetes mellitus and hypertension.  Continue losartan. Rosuvastatin.  And avoidance of NSAIDs   Lab Results  Component Value Date   CREATININE 1.38 03/09/2018

## 2018-03-20 ENCOUNTER — Telehealth: Payer: Self-pay | Admitting: Internal Medicine

## 2018-03-20 MED ORDER — ALPRAZOLAM 0.5 MG PO TABS: 0.5000 mg | ORAL_TABLET | Freq: Every evening | ORAL | 5 refills | Status: DC | PRN

## 2018-03-20 NOTE — Telephone Encounter (Signed)
Ok tto fill

## 2018-03-20 NOTE — Telephone Encounter (Signed)
Copied from [place] [phone]. Topic: Quick Communication - Rx Refill/Question >> Mar 20, 2018 12:08 PM Margot Ables wrote: Medication: ALPRAZolam Duanne Moron) 0.5 MG tablet - pt has 3 left - pt cannot sleep without it per [place], wife It helps with sleep and doesn't make him too sedated - past hx of stroke and cannot take certain medications  Last OV Dr. Derrel Nip 12/03/17 Last refill 08/19/17 #30 with 5 refills  Has the patient contacted their pharmacy? Yes - no refills Preferred Pharmacy (with phone number or street name): [id number] - [address], [place] [phone] (Phone) [phone] (Fax)

## 2018-03-20 NOTE — Telephone Encounter (Signed)
Refilled

## 2018-03-26 ENCOUNTER — Other Ambulatory Visit: Payer: Self-pay | Admitting: Internal Medicine

## 2018-04-09 ENCOUNTER — Telehealth: Payer: Self-pay | Admitting: Internal Medicine

## 2018-04-09 NOTE — Telephone Encounter (Signed)
His insurance will not cover Cologuard at this time because we haverequested the test 3 months early!  Last screening was Dec 2016.

## 2018-04-26 ENCOUNTER — Other Ambulatory Visit: Payer: Self-pay | Admitting: Internal Medicine

## 2018-04-29 NOTE — Progress Notes (Signed)
[facility] [address] [place]. Anthony Allen [phone]       OFFICE FOLLOW UP NOTE  [name] Date of Birth:  Jul 28, 1935 Medical Record Number:  [id number]   Reason for Referral:  Hospital stroke follow up  CHIEF COMPLAINT:  Chief Complaint  Patient presents with  . 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  He was doing well there and discharged in stable condition.  He was started on Plavix due to posterior circulation disease but had not filled the Plavix at discharge.  He comes into with waxing/waning right-sided weakness and significant worsening since discharge from [facility].  Prior to being discharged from [facility], he was able to ambulate with minimal assistance, but needed 3 people to get him in the car to his house after discharge.  The morning he came in to [facility], he was unable to lift his right arm at all.  TPA was not administered due to being outside of window.  CT scan reviewed and was negative for hemorrhage.  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 2D echo showed EF of 65-70% and negative for PFO.  Bilateral carotid ultrasound showed mild amount of calcified plaque at the right carotid bulb and proximal right ICA, minimal plaque at the level of the left carotid bulb and left ICA origin, and estimated bilateral ICA stenosis less than 50%.  It was recommended at discharge the patient continue aspirin 81 mg along with Plavix 75 mg daily along with Crestor 20 mg daily.  Patient was discharged to a skilled nursing facility at  [facility].  08/05/17 visit PS: Since discharge, patient has been improving as far as speech and right-sided weakness and return home approximately 2 weeks ago.  Patient is accompanied today by wife and friend.  Patient states that he was not given aspirin while he was in the nursing home,  He only received Plavix in which he denied increased bleeding or bruising. Patient is continuing to utilize home PT, OT, and ST 2-3 times per week.  Patient's blood pressure has been controlled and is satisfactory today at 112/63.  Patient continues to take Crestor and denies side effects of myalgias.  Wife continues to stay home with patient but does higher CNA to assist with some ADLs such as bathing and dressing.  Patient does say he continues to have some right-sided weakness but this has improved and he is getting great benefit out of therapies.  Patient also states that he is able to ambulate short distance with his rolling walker.  Patient denies new or worsening TIA/stroke symptoms.  11/04/17 visit: Patient returns today for follow-up visit and is accompanied by his wife.  He continues PT/OT at our neuro rehab center 2 times per week where he continues to make progress.  His PCP recently started him on a trial of Neurontin and Remeron were patient was able to take 1 dose apiece Friday evening but this made him too lethargic.  Wife planning on calling PCP today to discuss this.  Patient continues to take Plavix without  side effects of bleeding or bruising.  Continues to take Crestor without side effects myalgias.  PCP recently decreased Crestor dose from 20 mg to 10 mg.  Blood pressure today satisfactory 104/61.  He is currently using wheelchair for long distance but is able to use rolling walker for short distance ambulation.  Denies new or worsening stroke/TIA symptoms.  Interval history 04/30/2018: Patient is being seen today for 32-month follow-up visit.  Patient continues to take Plavix without side effects of  bleeding or bruising.  Continues to take Crestor without side effects myalgias. He continues to do well with residual deficit of right hemiparesis with mild improvement. He has since completed therapy but wife questioning whether they would be able to return. He plans on having dental work done soon and would like to wait until after his dental procedures to restart. He does continue to ambulate with rolling walker but using wheelchair for long distance. Denies any recent falls. Blood pressure today satisfactory at 112/68. Recent A1c 6.2 on 03/09/18. Lipid panel showed LDL 24.  No further concerns at this time.  Denies new or worsening stroke/TIA symptoms.     ROS:   14 system review of systems performed and negative with exception of weakness and walking difficulty  PMH:  Past Medical History:  Diagnosis Date  . Arthritis    "mild in my hands" (06/04/2017)  . Complication of anesthesia    "was told never to use Propofol because of parent's adverse reaction" (06/04/2017)  . CVA (cerebral vascular accident) ([place])    hospitalized from 12/22-12/24 due to left-sided weakness, slurred speech and difficulty swallowing. He was diagnosed as left pontine stroke./notes 06/03/2017  . Family history of adverse reaction to anesthesia    "mother and dad had allergic reaction Propofol" (06/04/2017)  . Glaucoma, both eyes   . Gout   . Hypertension   . Parotid mass   . Stroke (cerebrum) ([place]) 06/03/2017  . Type 2 diabetes mellitus with hyperglycemia (HCC)     PSH:  Past Surgical History:  Procedure Laterality Date  . TONSILLECTOMY  1943    Social History:  Social History   Socioeconomic History  . Marital status: Married    Spouse name: Not on file  . Number of children: Not on file  . Years of education: Not on file  . Highest education level: Not on file  Occupational History  . Not on file  Social Needs  . Financial resource strain: Not on file  . Food insecurity:    Worry: Not on  file    Inability: Not on file  . Transportation needs:    Medical: Not on file    Non-medical: Not on file  Tobacco Use  . Smoking status: Never Smoker  . Smokeless tobacco: Never Used  Substance and Sexual Activity  . Alcohol use: Yes    Comment: 06/04/2017 "might have 1 drink q 2 wk; if that"  . Drug use: No  . Sexual activity: Not Currently  Lifestyle  . Physical activity:    Days per week: Not on file    Minutes per session: Not on file  . Stress: Not on file  Relationships  . Social connections:    Talks on phone: Not on file    Gets together: Not on file    Attends religious service: Not on file    Active member of club or organization: Not on file    Attends meetings of clubs or organizations: Not on file  Relationship status: Not on file  . Intimate partner violence:    Fear of current or ex partner: Not on file    Emotionally abused: Not on file    Physically abused: Not on file    Forced sexual activity: Not on file  Other Topics Concern  . Not on file  Social History Narrative  . Not on file    Family History:  Family History  Problem Relation Age of Onset  . Arthritis Mother   . Stroke Father   . Hypertension Father   . Heart disease Father 58       AMI  . Kidney disease Father        bladder ca congenital loss of kidney  . Arthritis Maternal Grandmother   . Early death Daughter 16       aspiration  . Cancer Brother        Scalp    Medications:   Current Outpatient Medications on File Prior to Visit  Medication Sig Dispense Refill  . acetaminophen (TYLENOL) 500 MG tablet Take 500 mg by mouth every 6 (six) hours as needed.    . ALPRAZolam (XANAX) 0.5 MG tablet Take 1 tablet (0.5 mg total) by mouth at bedtime as needed for anxiety. 30 tablet 5  . Cholecalciferol (VITAMIN D-3) 1000 units CAPS Take 1,000 Units by mouth daily.    . clopidogrel (PLAVIX) 75 MG tablet TAKE 1 TABLET(75 MG) BY MOUTH DAILY 90 tablet 1  . fluticasone (FLONASE) 50  MCG/ACT nasal spray Place 2 sprays into both nostrils daily as needed for allergies or rhinitis.     Marland Kitchen gabapentin (NEURONTIN) 100 MG capsule     . HYDROCORTISONE, TOPICAL, 2 % LOTN Apply 1 application topically as needed (itching).    . JARDIANCE 10 MG TABS tablet TAKE 1 TABLET BY MOUTH EVERY DAY 90 tablet 1  . latanoprost (XALATAN) 0.005 % ophthalmic solution Instill 1 drop into both eyes in the evening  0  . losartan (COZAAR) 50 MG tablet Take 1 tablet (50 mg total) by mouth daily. 90 tablet 3  . Menthol, Topical Analgesic, (BLUE-EMU MAXIMUM STRENGTH EX) Apply topically.    . mirtazapine (REMERON) 7.5 MG tablet   0  . mirtazapine (REMERON) 7.5 MG tablet TAKE 1 TABLET(7.5 MG) BY MOUTH AT BEDTIME 90 tablet 0  . nystatin (NYSTATIN) powder Apply topically 2 (two) times daily. As needed for rash 60 g 2  . rosuvastatin (CRESTOR) 10 MG tablet TAKE 1 TABLET BY MOUTH EVERY DAY 90 tablet 1  . timolol (TIMOPTIC) 0.5 % ophthalmic solution Instill 1 drop into the right eye in the morning  0   No current facility-administered medications on file prior to visit.     Allergies:  No Known Allergies  Physical Exam  Vitals:   04/30/18 1123  BP: 112/68  Pulse: 84  Weight: 151 lb (68.5 kg)   Body mass index is 22.3 kg/m. No exam data present  General: well developed, pleasant elderly Caucasian male, well nourished, seated, in no evident distress Head: Frontal nodule present along with right mandible nodule Neck: supple with no carotid or supracla we did we went there got it and brought back yet we left her leg 1145 vicular bruits Cardiovascular: regular rate and rhythm, no murmurs Musculoskeletal: no deformity Skin:  no rash/petichiae Vascular:  Normal pulses all extremities  Neurologic Exam Mental Status: Awake and fully alert. Oriented to place and time. Recent and remote memory intact. Attention span, concentration and fund  of knowledge appropriate. Mood and affect appropriate.  Cranial  Nerves: Fundoscopic exam reveals sharp disc margins. Pupils equal, briskly reactive to light. Extraocular movements full with dysmetria present. Visual fields full to confrontation. Hearing intact. Facial sensation intact. Face, tongue, palate moves normally and symmetrically.  Motor: RUE: 4/5 with weak grip strength; RLE: 4/5; equal strength left upper and lower extremity Sensory.: intact to touch , pinprick , position and vibratory sensation.  Coordination: Decreased rapid alternating movements in right upper extremity.  Finger-to-nose and heel-to-shin performed accurately in left upper and lower extremity.   Gait and Station: Patient currently sitting in wheelchair and as he did not bring rolling walker to appointment, unable to test gait.   Reflexes: 2+ and symmetric. Toes downgoing.    Diagnostic Data (Labs, Imaging, Testing)  Ct Head Wo Contrast Result Date: 06/03/2017 IMPRESSION: Mild chronic ischemic white matter disease. Possible low density seen in left pons which may correspond to infarction seen on prior MRI. No other abnormality is seen.  Ct Angio Head and Neck W Or Wo Contrast Result Date: 06/03/2017 IMPRESSION: 1. No acute intracranial finding when compared to brain MRI and MRA 2 days prior. 2. A known acute left pontine infarct is subtle by CT. 3. 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 Notable irregular plaque at the proximal left subclavian. 4. 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 5. Known left parapharyngeal and deep parotid mass/neoplasm. ENT referral has been recommended previously. 6. Alveolar ridge and body erosion of the left mandible, recommend mucosal exam to exclude underlying destructive lesion.  Echocardiogram: 06/01/2017 Study Conclusions  Left ventricle: The cavity size was normal. Wall thickness was normal. Systolic function was vigorous. The estimated ejection fraction was in the range of 65% to 70%. Wall motion was  normal; there were no regional wall motion abnormalities. Doppler parameters are consistent with abnormal left ventricular relaxation (grade 1 diastolic dysfunction). - Mitral valve: Valve area by pressure half-time: 2.27 cm^2. Impressions: - Normal Overall LVF Normal Wall Motion EF=65% Normal Right side. Normal study.  B/L Carotid U/S:   IMPRESSION: Mild amount of calcified plaque at the level of the right carotid bulb and proximal right ICA. Minimal plaque at the level of the left carotid bulb and left ICA origin. Estimated bilateral ICA stenoses are less than 50%.     ASSESSMENT: 82 y.o. year old male here with left pontine stroke at outside hospital on 05/31/2017 and presented to [facility] with increased right-sided weakness but negative for new stroke on 06/01/2017 likely recurrence from recent left pontine lacunar stroke due to small vessel disease but he also has concurrent intracranial atherosclerosis.  Vascular risk factors include diabetes mellitus, hyperlipidemia , intracranial atherosclerosis and hypertension.  Patient returns today for follow-up appointment and overall is doing well from a stroke standpoint and continues to improve with right-sided hemiparesis.   PLAN: -Continue clopidogrel 75 mg daily  and Crestor for secondary stroke prevention -F/u with PCP regarding your HLD and HTN management -continue to monitor BP at home -Continue to stay active as tolerated and maintain a healthy diet -My plans on calling office after dental procedures for possible restarting of PT/OT for continued deficits -Maintain strict control of hypertension with blood pressure goal below 130/90, diabetes with hemoglobin A1c goal below 6.5% and cholesterol with LDL cholesterol (bad cholesterol) goal below 70 mg/dL. I also advised the patient to eat a healthy diet with plenty of whole grains, cereals, fruits and vegetables, exercise regularly and maintain ideal body  weight.  Follow up in 6 months or call earlier if needed  Greater than 50% of time during this 25 minute visit was spent on counseling,explanation of diagnosis of left pontine stroke, reviewing risk factor management of DM, HLD, and HTN, planning of further management, discussion with patient and family and coordination of care  Venancio Poisson, [facility]  [facility] [address] [place], [place] [address]  Phone [phone] Fax [phone]

## 2018-04-30 ENCOUNTER — Encounter: Payer: Self-pay | Admitting: Adult Health

## 2018-04-30 ENCOUNTER — Ambulatory Visit: Payer: Medicare Other | Admitting: Adult Health

## 2018-04-30 VITALS — BP 112/68 | HR 84 | Wt 151.0 lb

## 2018-04-30 DIAGNOSIS — I10 Essential (primary) hypertension: Secondary | ICD-10-CM | POA: Diagnosis not present

## 2018-04-30 DIAGNOSIS — I69351 Hemiplegia and hemiparesis following cerebral infarction affecting right dominant side: Secondary | ICD-10-CM

## 2018-04-30 DIAGNOSIS — I6350 Cerebral infarction due to unspecified occlusion or stenosis of unspecified cerebral artery: Secondary | ICD-10-CM | POA: Diagnosis not present

## 2018-04-30 DIAGNOSIS — E1129 Type 2 diabetes mellitus with other diabetic kidney complication: Secondary | ICD-10-CM | POA: Diagnosis not present

## 2018-04-30 DIAGNOSIS — I639 Cerebral infarction, unspecified: Secondary | ICD-10-CM

## 2018-04-30 DIAGNOSIS — E785 Hyperlipidemia, unspecified: Secondary | ICD-10-CM | POA: Diagnosis not present

## 2018-04-30 NOTE — Progress Notes (Signed)
I agree with the above plan 

## 2018-04-30 NOTE — Patient Instructions (Signed)
Continue clopidogrel 75 mg daily  and Crestor  for secondary stroke prevention  Continue to follow up with PCP regarding cholesterol, blood pressure and diabetes management   Continue to stay active and maintain a healthy diet - you have been making great improvements...keep up the good work!!!  Continue to monitor blood pressure at home  Maintain strict control of hypertension with blood pressure goal below 130/90, diabetes with hemoglobin A1c goal below 6.5% and cholesterol with LDL cholesterol (bad cholesterol) goal below 70 mg/dL. I also advised the patient to eat a healthy diet with plenty of whole grains, cereals, fruits and vegetables, exercise regularly and maintain ideal body weight.  Followup in the future with me in 6 months or call earlier if needed       Thank you for coming to see [place] at [facility]. I hope we have been able to provide you high quality care today.  You may receive a patient satisfaction survey over the next few weeks. We would appreciate your feedback and comments so that we may continue to improve ourselves and the health of our patients.

## 2018-05-12 IMAGING — CT CT HEAD W/O CM
4 series · 17 of 47 positions shown, 19 images · non-contrast
Comparison: CT scan of May 31, 2017. MRI June 01, 2017.

CLINICAL DATA: Right-sided weakness, slurred speech.

EXAM:
CT HEAD WITHOUT CONTRAST
TECHNIQUE: Contiguous axial images were obtained from the base of the skull
through the vertex without intravenous contrast.

[Series 3: head without · axial · non-contrast · 0.44mm/px · z∈[-112,+13]mm · 7 of 35 slices shown, 9 images]
[im 5/35  brain]
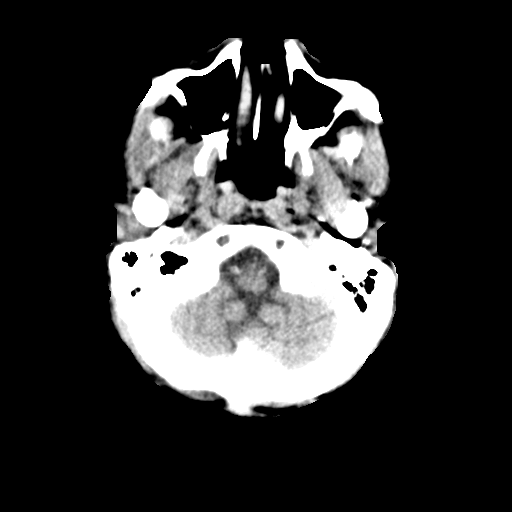
[im 5/35  bone]
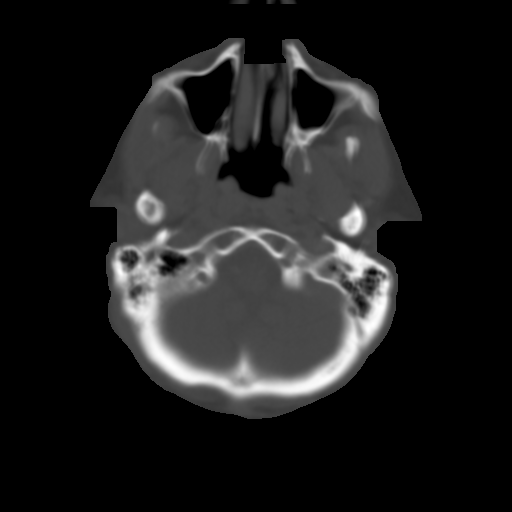
[im 9/35  brain]
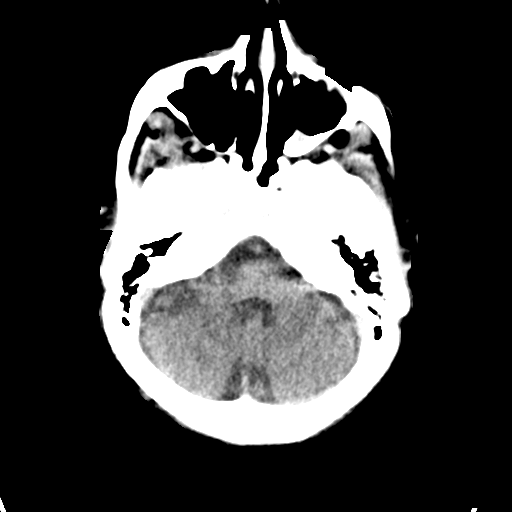
[im 13/35  brain]
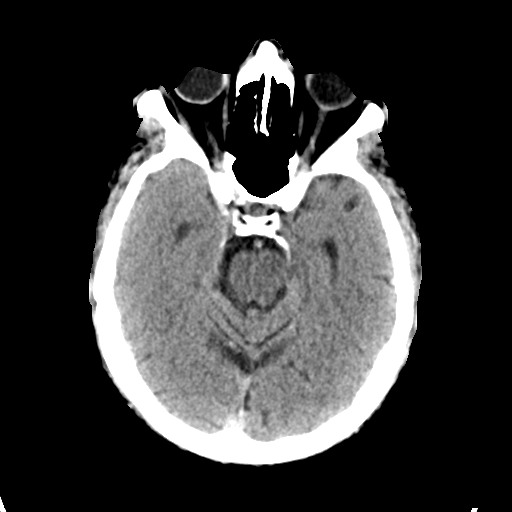
[im 18/35  brain]
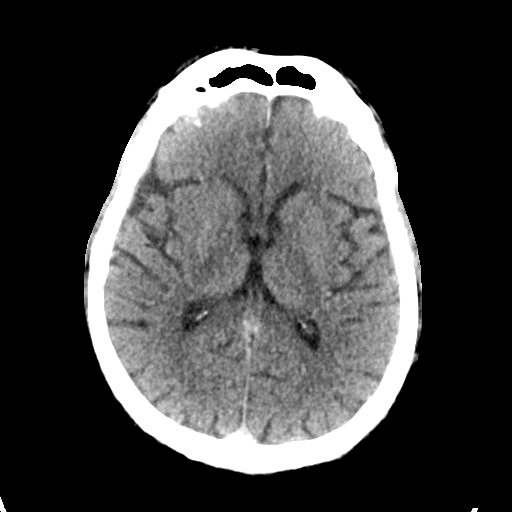
[im 22/35  brain]
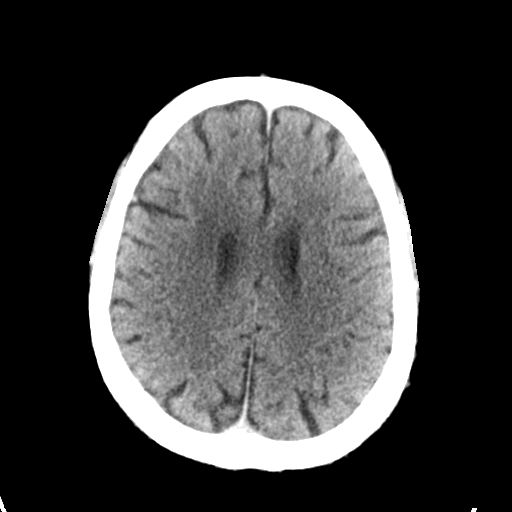
[im 22/35  bone]
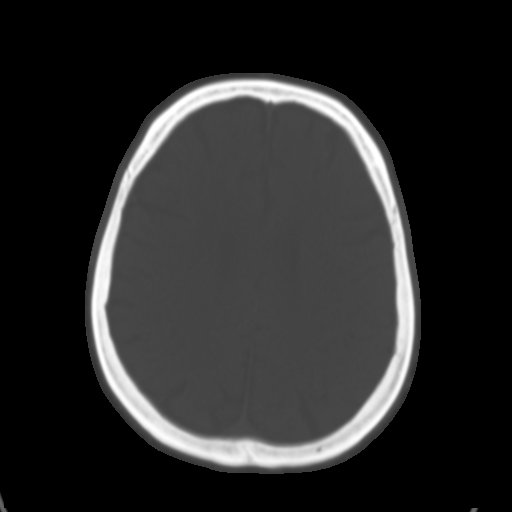
[im 26/35  brain]
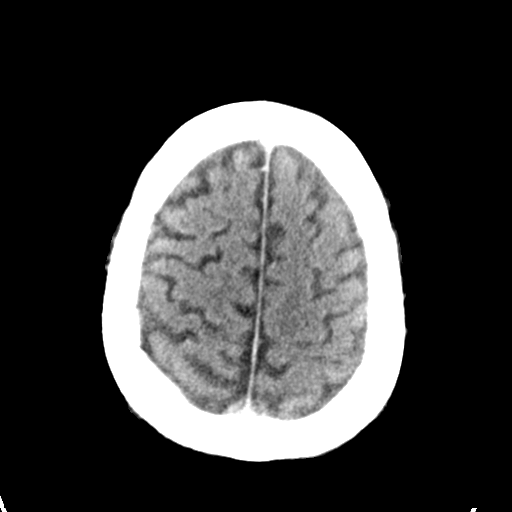
[im 30/35  brain]
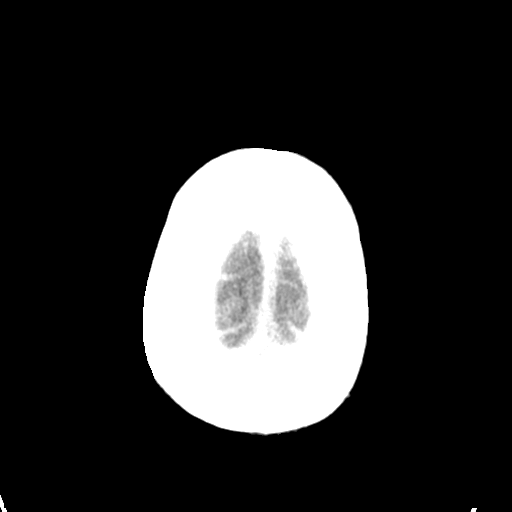

[Series 4: head bone · axial · 0.44mm/px · z∈[-116,-56]mm · 4 of 87 slices shown]
[im 9/87  bone]
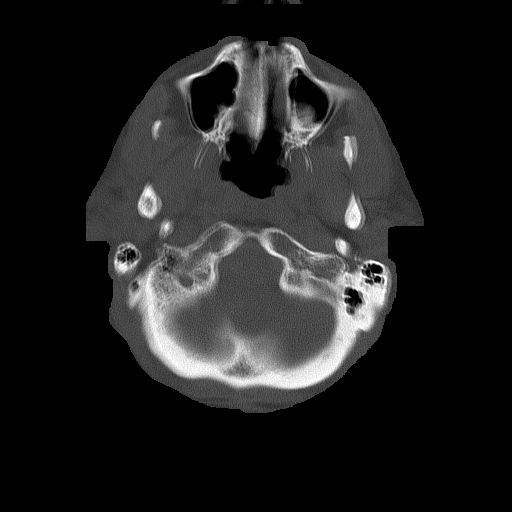
[im 18/87  bone]
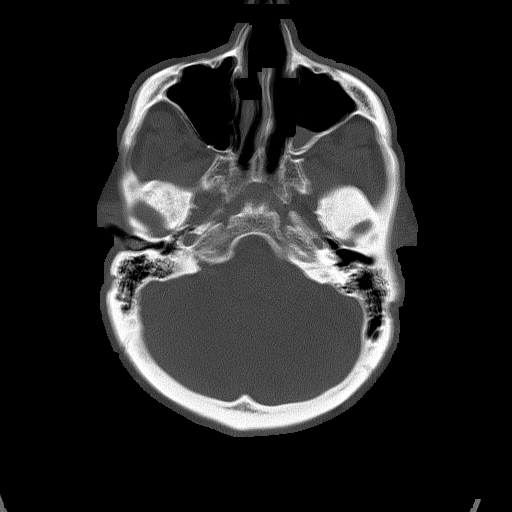
[im 26/87  bone]
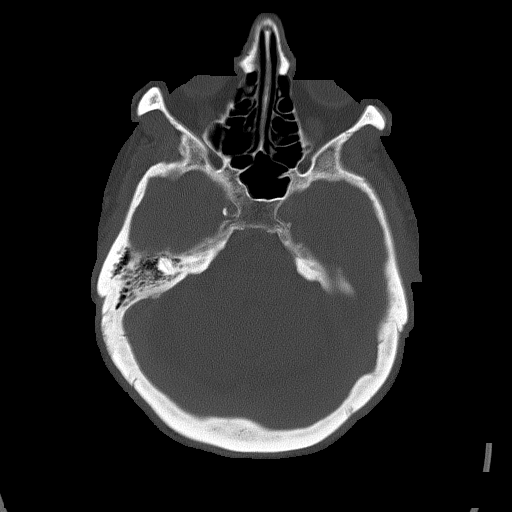
[im 39/87  bone]
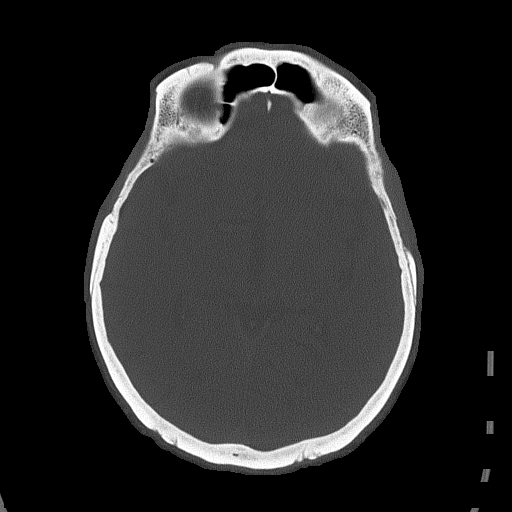

[Series 5: head without cor · coronal · non-contrast · 0.34mm/px · 3 of 69 slices shown]
[im 23/69  brain]
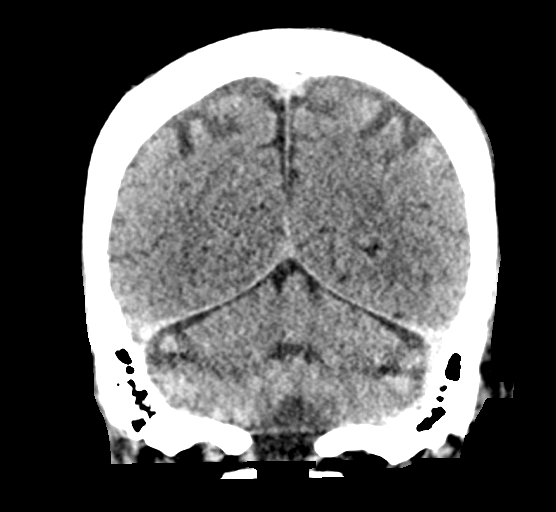
[im 31/69  brain]
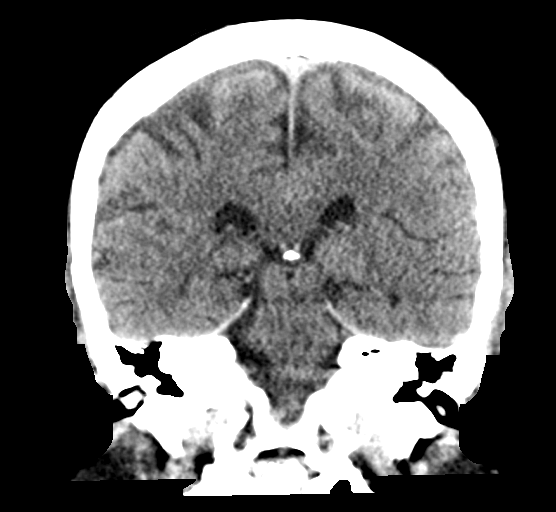
[im 38/69  brain]
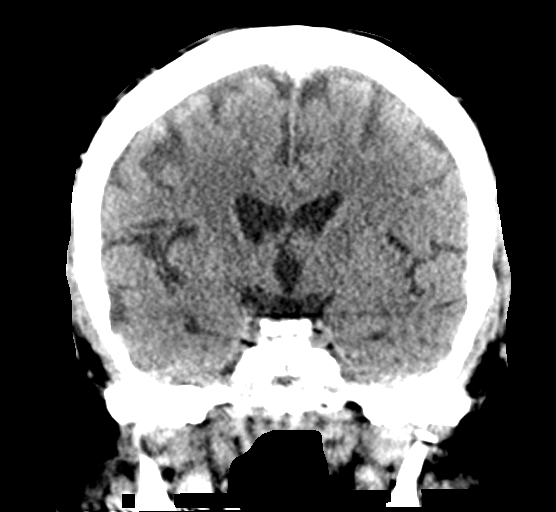

[Series 6: head without sag · sagittal · non-contrast · 0.35mm/px · 3 of 63 slices shown]
[im 21/63  brain]
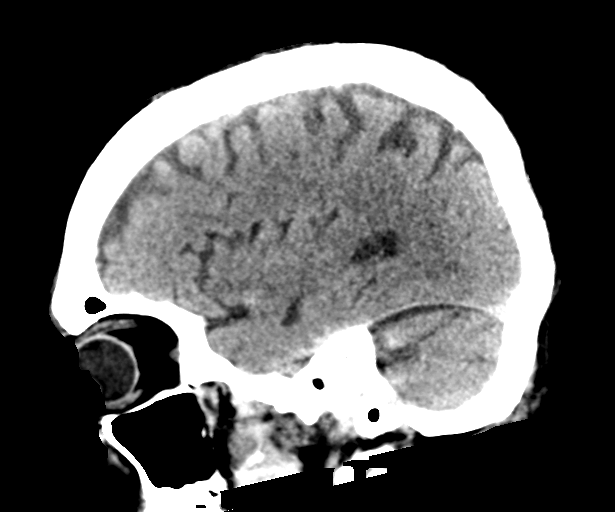
[im 32/63  brain]
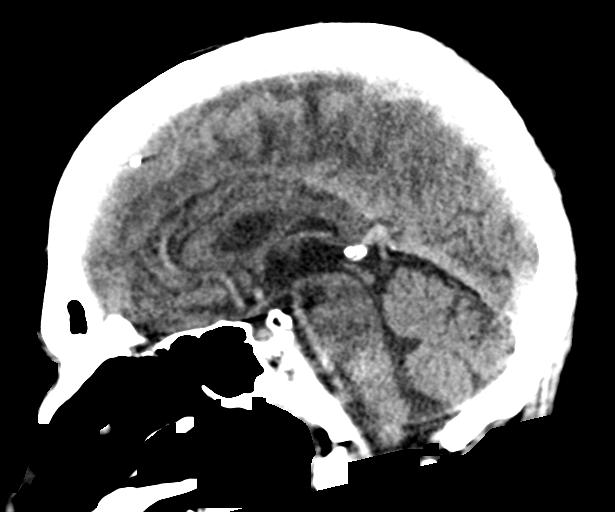
[im 42/63  brain]
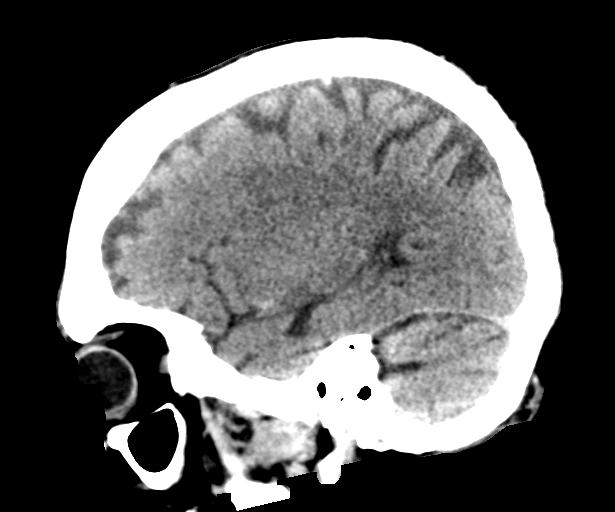

[17 of 47 positions shown; findings below may reference images not displayed]

FINDINGS: Brain: Mild chronic ischemic white matter disease is noted. Faint
low density is noted in left pons which may correspond to infarction
identified on prior MRI. No mass effect or midline shift is noted.
Ventricular size is within normal limits. No hemorrhage or mass
lesion is noted.

Vascular: No hyperdense vessel or unexpected calcification.

Skull: Normal. Negative for fracture or focal lesion.

Sinuses/Orbits: No acute finding.

Other: None.
IMPRESSION: Mild chronic ischemic white matter disease. Possible low density
seen in left pons which may correspond to infarction seen on prior
MRI. No other abnormality is seen.

## 2018-05-12 IMAGING — CT CT ANGIO HEAD
1 of 8 series · 6 of 33 positions shown · IV contrast (OMNI 350)
Comparison: Head CT and brain MRI from 2 days ago

CLINICAL DATA: Recent stroke. Continued right-sided weakness
slurred speech.

EXAM:
CT ANGIOGRAPHY HEAD AND NECK
TECHNIQUE: Multidetector CT imaging of the head and neck was performed using
the standard protocol during bolus administration of intravenous
contrast. Multiplanar CT image reconstructions and MIPs were
obtained to evaluate the vascular anatomy. Carotid stenosis
measurements (when applicable) are obtained utilizing NASCET
criteria, using the distal internal carotid diameter as the
denominator.
CONTRAST:  50mL 9BQEYE-OQ8 IOPAMIDOL (9BQEYE-OQ8) INJECTION 76%

[Series 7: cta neck axial · axial · 0.39mm/px · z∈[-288,-40]mm · 6 of 347 slices shown]
[im 50/347  soft-tissue]
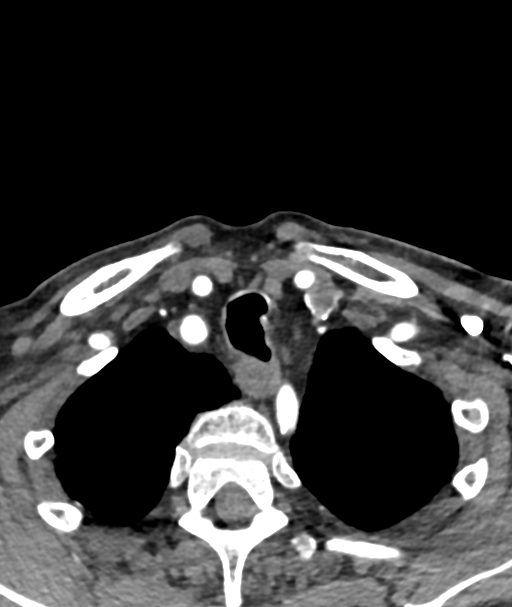
[im 99/347  bone]
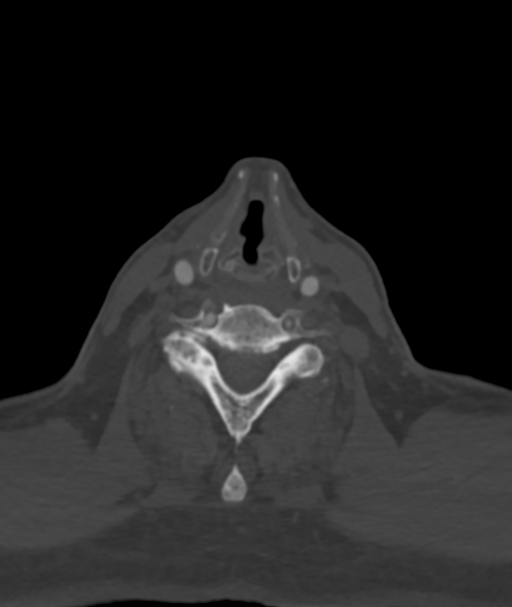
[im 149/347  soft-tissue]
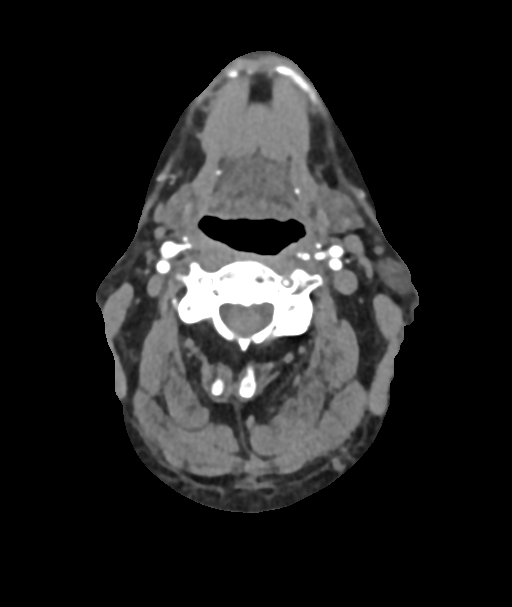
[im 198/347  bone]
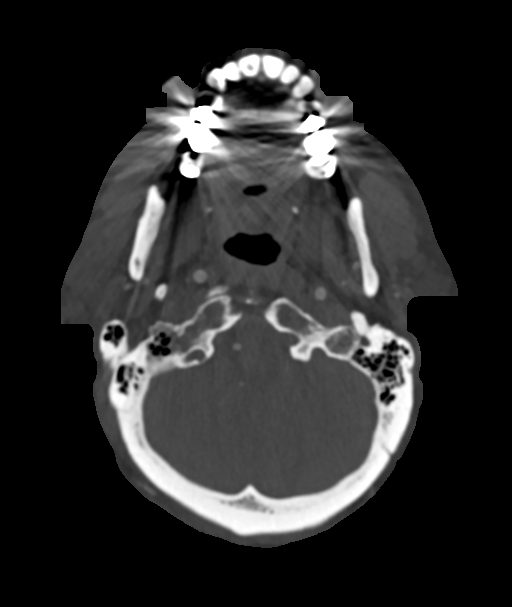
[im 248/347  soft-tissue]
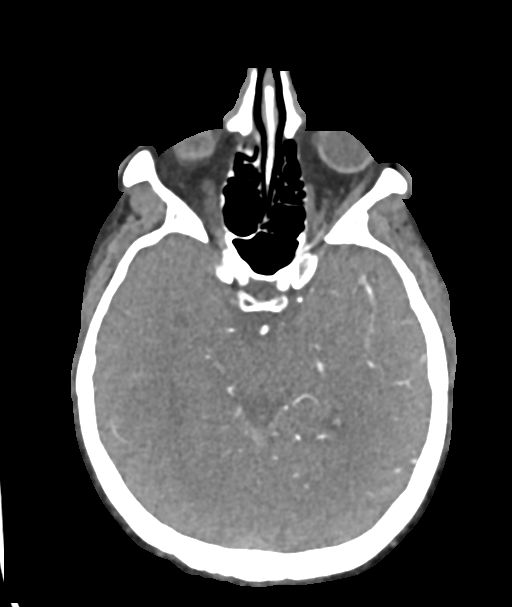
[im 297/347  bone]
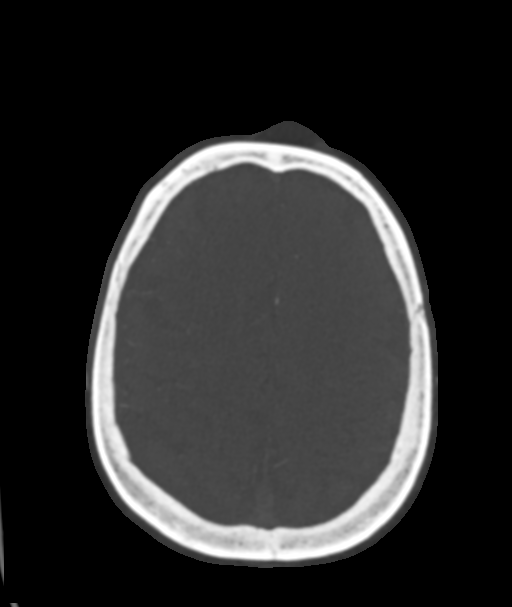

[6 of 33 positions shown; findings below may reference images not displayed]

FINDINGS: CTA NECK FINDINGS

Aortic arch: Atherosclerosis. Three vessel branching. No acute
finding.

Right carotid system: Moderate primarily calcified plaque at the ICA
bulb without flow limiting stenosis or ulceration.

Left carotid system: Moderate calcified and noncalcified plaque of
the proximal ICA without flow limiting stenosis or ulceration.

Vertebral arteries: Prominent irregular and ulcerated plaque in the
proximal left subclavian artery. Left vertebral occlusion at the
dura, there is a left vertebral artery based on T2 weighted imaging
from 2 days prior. The dominant right vertebral artery shows no flow
limiting stenosis or ulceration.

Skeleton: Excavation of the left mandible body contiguous with
mucosa at the alveolar ridge.

Other neck: There is a known left deep parotid and parapharyngeal
mass has better seen by prior MRI and enhanced neck CT,
approximately 4 x 1 cm. Subgaleal lipoma of the forehead.

Upper chest: No acute finding

Review of the MIP images confirms the above findings

CTA HEAD FINDINGS

Anterior circulation: Atherosclerotic plaque on the carotid siphons.
No major branch occlusion. Chronic azygos appearance of the left A2
and aplastic left A1 segment. A small truncated right A2 segment is
visible by CTA. Negative for aneurysm.

Posterior circulation: Left vertebral occlusion of the dura.
Atherosclerosis of the right V4 segment. Moderate narrowing of the
proximal basilar. Fetal type left PCA. Atherosclerotic irregularity
of bilateral PCA with bilateral distal P2 with severe stenosis, with
flow gap on the left. Right PICA stenosis that was better seen on
previous MRA.

Venous sinuses: Patent

Anatomic variants: As above

Delayed phase: No abnormal intracranial enhancement. A left pontine
acute infarct by MRI is subtle by CT. Remote right basal ganglia
infarct.

Review of the MIP images confirms the above findings
IMPRESSION: 1. No acute intracranial finding when compared to brain MRI and MRA
2 days prior.
2. A known acute left pontine infarct is subtle by CT.
3. Left vertebral occlusion at the dura. Notable irregular plaque at
the proximal left subclavian.
4. Moderate proximal basilar stenosis. Severe bilateral P2 stenosis.
5. Known left parapharyngeal and deep parotid mass/neoplasm. ENT
referral has been recommended previously.
6. Alveolar ridge and body erosion of the left mandible, recommend
mucosal exam to exclude underlying destructive lesion.

## 2018-05-12 NOTE — Addendum Note (Signed)
Addended by: Adair Laundry on: 05/12/2018 10:28 AM   Modules accepted: Orders

## 2018-05-12 NOTE — Progress Notes (Signed)
cologuard has been ordered and faxed.

## 2018-05-13 ENCOUNTER — Other Ambulatory Visit: Payer: Self-pay | Admitting: Internal Medicine

## 2018-05-16 ENCOUNTER — Other Ambulatory Visit: Payer: Self-pay | Admitting: Internal Medicine

## 2018-05-28 ENCOUNTER — Other Ambulatory Visit: Payer: Self-pay | Admitting: Internal Medicine

## 2018-07-01 ENCOUNTER — Telehealth: Payer: Self-pay

## 2018-07-01 NOTE — Telephone Encounter (Signed)
Please provide letter to [facility] regarding medical clearance request to undergo placement of dental implant.  Patients stroke occurred on 05/2017 and has been stable since this time.  At this time, patient is cleared to undergo procedure with holding Plavix 3 to 5 days preoperatively and recommend to restart immediately after once hemodynamically stable.  Patient is at a small but acceptable risk of recurrent stroke/TIA off Plavix therapy.  No restrictions neurologically regarding anesthesia during procedure.  Please fax letter to [phone]

## 2018-07-01 NOTE — Telephone Encounter (Signed)
I fax clearance letter to [facility] to [phone]. Clearance letter fax twice and confirmed.

## 2018-07-01 NOTE — Telephone Encounter (Signed)
Clearance form from [facility] given to [facility] NP. Place on her desk.

## 2018-07-27 ENCOUNTER — Other Ambulatory Visit: Payer: Self-pay | Admitting: Internal Medicine

## 2018-08-10 ENCOUNTER — Other Ambulatory Visit: Payer: Self-pay | Admitting: Internal Medicine

## 2018-08-13 ENCOUNTER — Other Ambulatory Visit: Payer: Self-pay | Admitting: Internal Medicine

## 2018-08-27 ENCOUNTER — Other Ambulatory Visit: Payer: Self-pay

## 2018-08-27 MED ORDER — MIRTAZAPINE 7.5 MG PO TABS: ORAL_TABLET | ORAL | 1 refills | Status: DC

## 2018-09-10 ENCOUNTER — Telehealth: Payer: Self-pay

## 2018-09-10 NOTE — Telephone Encounter (Signed)
Pt's wife called stating she was returning a call back from the office concerning a virtual visit.  Pt does not have Wi-Fi but is ok to do a phone visit for scheduled appt on 09/15/18.  Pt prefers not to come in for lab appt that is scheduled for tomorrow 09/11/18.  Not sure if lab appt can be postponed.  Please advise about lab appt and cancel if Dr. Derrel Nip is ok with it.  Please call home phone number for phone visit.  [phone]

## 2018-09-11 ENCOUNTER — Other Ambulatory Visit: Payer: Self-pay

## 2018-09-11 NOTE — Telephone Encounter (Signed)
Pt's visit has been changed to a telephone visit and per pt request his lab appt for today has been canceled.

## 2018-09-15 ENCOUNTER — Ambulatory Visit (INDEPENDENT_AMBULATORY_CARE_PROVIDER_SITE_OTHER): Payer: Medicare Other | Admitting: Internal Medicine

## 2018-09-15 VITALS — BP 96/61 | HR 84 | Temp 96.7°F

## 2018-09-15 DIAGNOSIS — N401 Enlarged prostate with lower urinary tract symptoms: Secondary | ICD-10-CM

## 2018-09-15 DIAGNOSIS — Z8673 Personal history of transient ischemic attack (TIA), and cerebral infarction without residual deficits: Secondary | ICD-10-CM

## 2018-09-15 DIAGNOSIS — I6930 Unspecified sequelae of cerebral infarction: Secondary | ICD-10-CM

## 2018-09-15 DIAGNOSIS — I672 Cerebral atherosclerosis: Secondary | ICD-10-CM

## 2018-09-15 DIAGNOSIS — E785 Hyperlipidemia, unspecified: Secondary | ICD-10-CM

## 2018-09-15 DIAGNOSIS — E559 Vitamin D deficiency, unspecified: Secondary | ICD-10-CM | POA: Diagnosis not present

## 2018-09-15 DIAGNOSIS — N183 Chronic kidney disease, stage 3 unspecified: Secondary | ICD-10-CM

## 2018-09-15 DIAGNOSIS — E1122 Type 2 diabetes mellitus with diabetic chronic kidney disease: Secondary | ICD-10-CM | POA: Diagnosis not present

## 2018-09-15 DIAGNOSIS — I10 Essential (primary) hypertension: Secondary | ICD-10-CM

## 2018-09-15 DIAGNOSIS — Z79899 Other long term (current) drug therapy: Secondary | ICD-10-CM

## 2018-09-15 DIAGNOSIS — R634 Abnormal weight loss: Secondary | ICD-10-CM

## 2018-09-15 DIAGNOSIS — R350 Frequency of micturition: Secondary | ICD-10-CM

## 2018-09-15 NOTE — Patient Instructions (Signed)
Change losartan dosing to nighttime and lower dose  To 25 mg daily   Start by taking 25 mg starting tonight   Goal is 120/70  For  blood pressure   You and Lyndia can have your labs done together next week THE DAY BEFORE her appointment

## 2018-09-15 NOTE — Progress Notes (Signed)
Virtual Visit via Telephone Note  I connected with Anthony Allen on 09/16/18 at  2:00 PM EDT by telephone and verified that I am speaking with the correct person using two identifiers.   I discussed the limitations, risks, security and privacy concerns of performing an evaluation and management service by telephone and the availability of in person appointments. I also discussed with the patient that there may be a patient responsible charge related to this service. The patient expressed understanding and agreed to proceed.   History of Present Illness:  6 month follow up on Type 2 diabetes with CKD , hypertension, hyperlipidemia,  And persistent right sided deficits following an ischemic stroke .  Patient has no complaints today.  Patient is following a low glycemic index diet and taking Jardiance  And rosuvastatin regularly without side effects.  Fasting sugars have been under less than 120 most of the time and post prandials have not been checked. Patient is walking with his walker daily at home  And not  intentionally trying to lose weight .  Patient has had an eye exam in the last 12 months and his wife  checks feet regularly for signs of infection.  Patient does not walk barefoot outside,  And denies an numbness tingling or burning in feet. Patient is up to date on all recommended vaccinations  Since his last visit he has had oral surgery to remove several teeth,  One of which had abscessed,   and several implantsw done.  In spite of the prolonged soft diet  His eight has been stable  Hypertension: patient checks blood pressure twice weekly at home.  Readings have been for the most part <100/60 . Patient is following a reduce salt diet most days and is taking 50 g losartan in the morning rescribed   Post CVA:  Right hand deficits limiting  use, (can't use utensils),  Using therapy glove  To stretch tendons in hand.  Still weak in right leg .  Using a walker,  Now able to get out of twin bed  using a bar to pull up .  Has nocturia x 3  Needs help taking a bath .  Depression/anxiety: his wife reports that his mood stable and sleeping well on mirtazapine and prn alprazolam    Observations/Objective:   General impression: alert, cooperative and articulate.  No signs of being in distress  Lungs: not short of breath ,  No cough, speaking in full sentences  Psych: affect normal, speech is articulate and non pressured .     Assessment and Plan:  Essential hypertension, benign BP on the low side and risk of orthostasis induced falls high given  History of right sided weakness,  Reducing losartan to 25 mg qhs   BPH (benign prostatic hyperplasia) Causing nocturia x 3 at night,  Likely aggravated by jardiance. Will consider adding flomax once his BP is less soft.   Type II diabetes mellitus with renal manifestations (HCC) Historically well-controlled on Jardiance.  hemoglobin A1c is due .  paent is up-to-date on eye exams and foot exam is deferred.  Patient is due for urine microalbumin to creatinine ratio . Patient is tolerating statin therapy for CAD risk reduction and on ACE/ARB for reduction in proteinuria.  Current labs are ordered   Lab Results  Component Value Date   HGBA1C 6.2 03/09/2018   Lab Results  Component Value Date   MICROALBUR <0.7 03/12/2018   No results found for: LIPASE Lab Results  Component  Value Date   CHOL 89 03/09/2018   HDL 34.00 (L) 03/09/2018   LDLCALC 24 03/09/2018   LDLDIRECT 117.0 05/06/2017   TRIG 155.0 (H) 03/09/2018   CHOLHDL 3 03/09/2018    Weight loss, unintentional Resolved with appetite and mood improvement  Continue Remeron  CKD (chronic kidney disease), stage III ([place])  Secondary to diabetes mellitus and hypertension.  Continue losartan. Rosuvastatin.  And avoidance of NSAIDs. 6 month assessment of Cr due    Lab Results  Component Value Date   CREATININE 1.38 03/09/2018     Late effects of cerebral ischemic stroke He  continues to have right hand weakness and right leg weakness,  He continues to require assistance with ADLs and use of a walker.    Cerebrovascular disease, arteriosclerotic, post-stroke He suspended Plavix for 4 days to complete his oral surgery but has resume it along with aspirin. Continue statin and ARB as well    Updated Medication List Outpatient Encounter Medications as of 09/15/2018  Medication Sig  . acetaminophen (TYLENOL) 500 MG tablet Take 500 mg by mouth every 6 (six) hours as needed.  . ALPRAZolam (XANAX) 0.5 MG tablet Take 1 tablet (0.5 mg total) by mouth at bedtime as needed for anxiety.  . Cholecalciferol (VITAMIN D-3) 1000 units CAPS Take 1,000 Units by mouth daily.  . clopidogrel (PLAVIX) 75 MG tablet TAKE 1 TABLET(75 MG) BY MOUTH DAILY  . fluticasone (FLONASE) 50 MCG/ACT nasal spray Place 2 sprays into both nostrils daily as needed for allergies or rhinitis.   Marland Kitchen JARDIANCE 10 MG TABS tablet TAKE 1 TABLET BY MOUTH EVERY DAY  . latanoprost (XALATAN) 0.005 % ophthalmic solution Instill 1 drop into both eyes in the evening  . losartan (COZAAR) 50 MG tablet TAKE 1 TABLET BY MOUTH EVERY DAY  . Menthol, Topical Analgesic, (BLUE-EMU MAXIMUM STRENGTH EX) Apply topically.  . mirtazapine (REMERON) 7.5 MG tablet TAKE 1 TABLET(7.5 MG) BY MOUTH AT BEDTIME  . nystatin (NYSTATIN) powder Apply topically 2 (two) times daily. As needed for rash  . rosuvastatin (CRESTOR) 10 MG tablet TAKE 1 TABLET BY MOUTH EVERY DAY  . timolol (TIMOPTIC) 0.5 % ophthalmic solution Instill 1 drop into the right eye in the morning  . gabapentin (NEURONTIN) 100 MG capsule   . HYDROCORTISONE, TOPICAL, 2 % LOTN Apply 1 application topically as needed (itching).   No facility-administered encounter medications on file as of 09/15/2018.     Follow Up Instructions:    I discussed the assessment and treatment plan with the patient. The patient was provided an opportunity to ask questions and all were answered. The  patient agreed with the plan and demonstrated an understanding of the instructions.   The patient was advised to call back or seek an in-person evaluation if the symptoms worsen or if the condition fails to improve as anticipated.  I provided 30 minutes of non-face-to-face time during this encounter.   Crecencio Mc, MD

## 2018-09-16 DIAGNOSIS — I672 Cerebral atherosclerosis: Secondary | ICD-10-CM | POA: Insufficient documentation

## 2018-09-16 DIAGNOSIS — Z8673 Personal history of transient ischemic attack (TIA), and cerebral infarction without residual deficits: Secondary | ICD-10-CM

## 2018-09-16 MED ORDER — LOSARTAN POTASSIUM 25 MG PO TABS: 25.0000 mg | ORAL_TABLET | Freq: Every day | ORAL | 1 refills | Status: DC

## 2018-09-16 NOTE — Assessment & Plan Note (Addendum)
Secondary to diabetes mellitus and hypertension.  Continue losartan. Rosuvastatin.  And avoidance of NSAIDs. 6 month assessment of Cr due    Lab Results  Component Value Date   CREATININE 1.38 03/09/2018

## 2018-09-16 NOTE — Assessment & Plan Note (Signed)
He suspended Plavix for 4 days to complete his oral surgery but has resume it along with aspirin. Continue statin and ARB as well

## 2018-09-16 NOTE — Assessment & Plan Note (Signed)
Resolved with appetite and mood improvement  Continue Remeron

## 2018-09-16 NOTE — Assessment & Plan Note (Signed)
Causing nocturia x 3 at night,  Likely aggravated by jardiance. Will consider adding flomax once his BP is less soft.

## 2018-09-16 NOTE — Assessment & Plan Note (Signed)
BP on the low side and risk of orthostasis induced falls high given  History of right sided weakness,  Reducing losartan to 25 mg qhs

## 2018-09-16 NOTE — Assessment & Plan Note (Signed)
Historically well-controlled on Jardiance.  hemoglobin A1c is due .  paent is up-to-date on eye exams and foot exam is deferred.  Patient is due for urine microalbumin to creatinine ratio . Patient is tolerating statin therapy for CAD risk reduction and on ACE/ARB for reduction in proteinuria.  Current labs are ordered   Lab Results  Component Value Date   HGBA1C 6.2 03/09/2018   Lab Results  Component Value Date   MICROALBUR <0.7 03/12/2018   No results found for: LIPASE Lab Results  Component Value Date   CHOL 89 03/09/2018   HDL 34.00 (L) 03/09/2018   LDLCALC 24 03/09/2018   LDLDIRECT 117.0 05/06/2017   TRIG 155.0 (H) 03/09/2018   CHOLHDL 3 03/09/2018

## 2018-09-16 NOTE — Assessment & Plan Note (Signed)
He continues to have right hand weakness and right leg weakness,  He continues to require assistance with ADLs and use of a walker.

## 2018-09-17 ENCOUNTER — Other Ambulatory Visit (INDEPENDENT_AMBULATORY_CARE_PROVIDER_SITE_OTHER): Payer: Medicare Other

## 2018-09-17 ENCOUNTER — Telehealth: Payer: Self-pay | Admitting: Internal Medicine

## 2018-09-17 ENCOUNTER — Other Ambulatory Visit: Payer: Self-pay

## 2018-09-17 DIAGNOSIS — E785 Hyperlipidemia, unspecified: Secondary | ICD-10-CM

## 2018-09-17 DIAGNOSIS — D751 Secondary polycythemia: Secondary | ICD-10-CM

## 2018-09-17 DIAGNOSIS — E559 Vitamin D deficiency, unspecified: Secondary | ICD-10-CM

## 2018-09-17 DIAGNOSIS — Z79899 Other long term (current) drug therapy: Secondary | ICD-10-CM

## 2018-09-17 DIAGNOSIS — E1122 Type 2 diabetes mellitus with diabetic chronic kidney disease: Secondary | ICD-10-CM | POA: Diagnosis not present

## 2018-09-17 DIAGNOSIS — N183 Chronic kidney disease, stage 3 unspecified: Secondary | ICD-10-CM

## 2018-09-17 LAB — COMPREHENSIVE METABOLIC PANEL
ALT: 30 U/L (ref 0–53)
AST: 23 U/L (ref 0–37)
Albumin: 4 g/dL (ref 3.5–5.2)
Alkaline Phosphatase: 77 U/L (ref 39–117)
BUN: 27 mg/dL — ABNORMAL HIGH (ref 6–23)
CO2: 26 mEq/L (ref 19–32)
Calcium: 9.5 mg/dL (ref 8.4–10.5)
Chloride: 104 mEq/L (ref 96–112)
Creatinine, Ser: 1.41 mg/dL (ref 0.40–1.50)
GFR: 48.3 mL/min — ABNORMAL LOW (ref >60.00)
Glucose, Bld: 114 mg/dL — ABNORMAL HIGH (ref 70–99)
Potassium: 3.9 mEq/L (ref 3.5–5.1)
Sodium: 141 mEq/L (ref 135–145)
Total Bilirubin: 1.1 mg/dL (ref 0.2–1.2)
Total Protein: 6.5 g/dL (ref 6.0–8.3)

## 2018-09-17 LAB — CBC WITH DIFFERENTIAL/PLATELET
Basophils Absolute: 0.1 10*3/uL (ref 0.0–0.1)
Basophils Relative: 1.1 % (ref 0.0–3.0)
Eosinophils Absolute: 0.4 10*3/uL (ref 0.0–0.7)
Eosinophils Relative: 6.4 % — ABNORMAL HIGH (ref 0.0–5.0)
HCT: 53.4 % — ABNORMAL HIGH (ref 39.0–52.0)
Hemoglobin: 18.4 g/dL (ref 13.0–17.0)
Lymphocytes Relative: 20.6 % (ref 12.0–46.0)
Lymphs Abs: 1.1 10*3/uL (ref 0.7–4.0)
MCHC: 34.4 g/dL (ref 30.0–36.0)
MCV: 95.8 fl (ref 78.0–100.0)
Monocytes Absolute: 0.4 10*3/uL (ref 0.1–1.0)
Monocytes Relative: 6.9 % (ref 3.0–12.0)
Neutro Abs: 3.6 10*3/uL (ref 1.4–7.7)
Neutrophils Relative %: 65 % (ref 43.0–77.0)
Platelets: 150 10*3/uL (ref 150.0–400.0)
RBC: 5.57 Mil/uL (ref 4.22–5.81)
RDW: 14 % (ref 11.5–15.5)
WBC: 5.5 10*3/uL (ref 4.0–10.5)

## 2018-09-17 LAB — LIPID PANEL
Cholesterol: 92 mg/dL (ref 0–200)
HDL: 33.5 mg/dL — ABNORMAL LOW (ref >39.00)
LDL Cholesterol: 27 mg/dL (ref 0–99)
NonHDL: 58.57
Total CHOL/HDL Ratio: 3
Triglycerides: 158 mg/dL — ABNORMAL HIGH (ref 0.0–149.0)
VLDL: 31.6 mg/dL (ref 0.0–40.0)

## 2018-09-17 LAB — HEMOGLOBIN A1C: Hgb A1c MFr Bld: 5.9 % (ref 4.6–6.5)

## 2018-09-17 LAB — VITAMIN D 25 HYDROXY (VIT D DEFICIENCY, FRACTURES): VITD: 64.22 ng/mL (ref 30.00–100.00)

## 2018-09-17 NOTE — Assessment & Plan Note (Signed)
Etiology unclear.  Hgb was 18.8 in Dec 2018 during admission for pontine CVA. Jak 2 mutation screen was negative.  Repeat Hgb today is 18.4.  Patient was fasting at time of lab draw.  Patient notified to increase hydration today, and will be referred urgently to Dr Janese Banks  For evaluation and possible phlebotomy on April 10.  Patient's wife Anthony Allen has been notified and has a cell phone that should be able to used for a [email or web address] visit [phone].

## 2018-09-17 NOTE — Addendum Note (Signed)
Addended by: Leeanne Rio on: 09/17/2018 10:56 AM   Modules accepted: Orders

## 2018-09-17 NOTE — Telephone Encounter (Signed)
Hemoglobin: 18.4 per [facility]. Dr. Derrel Nip made aware verbally.

## 2018-09-17 NOTE — Telephone Encounter (Signed)
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    JAk 2 mutations negative) seen virtually for follow up on multiple issues.  CBC checked and hgb critically high at 18.4  At 4 pm on the day before a holiday.  Patient called.  Lab Results  Component Value Date   WBC 8.9 06/03/2017   HGB 18.0 (H) 06/03/2017   HCT 53.0 (H) 06/03/2017   MCV 93.0 06/03/2017   PLT 197 06/03/2017

## 2018-09-18 ENCOUNTER — Inpatient Hospital Stay: Payer: Medicare Other | Attending: Oncology | Admitting: Oncology

## 2018-09-18 DIAGNOSIS — Z8673 Personal history of transient ischemic attack (TIA), and cerebral infarction without residual deficits: Secondary | ICD-10-CM | POA: Diagnosis not present

## 2018-09-18 DIAGNOSIS — Z79899 Other long term (current) drug therapy: Secondary | ICD-10-CM | POA: Insufficient documentation

## 2018-09-18 DIAGNOSIS — E119 Type 2 diabetes mellitus without complications: Secondary | ICD-10-CM | POA: Insufficient documentation

## 2018-09-18 DIAGNOSIS — I10 Essential (primary) hypertension: Secondary | ICD-10-CM | POA: Insufficient documentation

## 2018-09-18 DIAGNOSIS — D751 Secondary polycythemia: Secondary | ICD-10-CM | POA: Insufficient documentation

## 2018-09-18 NOTE — Progress Notes (Signed)
Patient verified using two identifiers for virtual visit via telephone today.  Verified the cell phone number [phone]) for MD to contact to initiate a WebEx evaluation.

## 2018-09-21 ENCOUNTER — Encounter: Payer: Self-pay | Admitting: Oncology

## 2018-09-21 ENCOUNTER — Other Ambulatory Visit: Payer: Self-pay | Admitting: Internal Medicine

## 2018-09-21 NOTE — Progress Notes (Signed)
I connected with Veatrice Bourbon on 09/21/18 at 10:45 AM EDT by video enabled telemedicine visit and verified that I am speaking with the correct person using two identifiers.   I discussed the limitations, risks, security and privacy concerns of performing an evaluation and management service by telemedicine and the availability of in-person appointments. I also discussed with the patient that there may be a patient responsible charge related to this service. The patient expressed understanding and agreed to proceed.  Other persons participating in the visit and their role in the encounter:  Patient and his wife  Patient's location:  home Provider's location:  [facility]  Reason for referral: Polycythemia Referring provider Dr. Derrel Nip  History of present illness: -patient is a 83 year old male with a prior history of pontine stroke in 2018.  He has been referred to [place] for evaluation of polycythemia.  Of note patient has been evaluated for polycythemia in the past. Review of his prior CBCs reveals that he has had a high hemoglobin of around 18 even back in 2014.  He did have Jak 2 exon 12 mutation checked in 2018 which was negative.  Most recent CBC from 09/17/2018 showed a white count of 5.5, H&H of 18.4/53.4 and a platelet count of 150.  Patient has been a lifelong non-smoker.  He lives with his wife and does have some difficulty and needs assistance with his ADLs as well due to this stroke.  He is not on any testosterone replacement therapy.  Does not have any known chronic lung disease.  Overall his weight has been stable and he denies any blood in his urine.    Review of Systems  Constitutional: Positive for malaise/fatigue. Negative for chills, fever and weight loss.  HENT: Negative for congestion, ear discharge and nosebleeds.   Eyes: Negative for blurred vision.  Respiratory: Negative for cough, hemoptysis, sputum production, shortness of breath and wheezing.   Cardiovascular:  Negative for chest pain, palpitations, orthopnea and claudication.  Gastrointestinal: Negative for abdominal pain, blood in stool, constipation, diarrhea, heartburn, melena, nausea and vomiting.  Genitourinary: Negative for dysuria, flank pain, frequency, hematuria and urgency.  Musculoskeletal: Negative for back pain, joint pain and myalgias.  Skin: Negative for rash.  Neurological: Positive for focal weakness (Due to prior stroke). Negative for dizziness, tingling, seizures, weakness and headaches.  Endo/Heme/Allergies: Does not bruise/bleed easily.  Psychiatric/Behavioral: Negative for depression and suicidal ideas. The patient does not have insomnia.     No Known Allergies  Past Medical History:  Diagnosis Date  . Arthritis    "mild in my hands" (06/04/2017)  . Complication of anesthesia    "was told never to use Propofol because of parent's adverse reaction" (06/04/2017)  . CVA (cerebral vascular accident) ([place])    hospitalized from 12/22-12/24 due to left-sided weakness, slurred speech and difficulty swallowing. He was diagnosed as left pontine stroke./notes 06/03/2017  . Family history of adverse reaction to anesthesia    "mother and dad had allergic reaction Propofol" (06/04/2017)  . Glaucoma, both eyes   . Gout   . Hypertension   . Parotid mass   . Stroke (cerebrum) ([place]) 06/03/2017  . Type 2 diabetes mellitus with hyperglycemia [facility])     Past Surgical History:  Procedure Laterality Date  . TONSILLECTOMY  1943    Social History   Socioeconomic History  . Marital status: Married    Spouse name: Not on file  . Number of children: Not on file  . Years of education: Not  on file  . Highest education level: Not on file  Occupational History  . Not on file  Social Needs  . Financial resource strain: Not on file  . Food insecurity:    Worry: Not on file    Inability: Not on file  . Transportation needs:    Medical: Not on file    Non-medical: Not on file  Tobacco  Use  . Smoking status: Never Smoker  . Smokeless tobacco: Never Used  Substance and Sexual Activity  . Alcohol use: Yes    Comment: 06/04/2017 "might have 1 drink q 2 wk; if that"  . Drug use: No  . Sexual activity: Not Currently  Lifestyle  . Physical activity:    Days per week: Not on file    Minutes per session: Not on file  . Stress: Not on file  Relationships  . Social connections:    Talks on phone: Not on file    Gets together: Not on file    Attends religious service: Not on file    Active member of club or organization: Not on file    Attends meetings of clubs or organizations: Not on file    Relationship status: Not on file  . Intimate partner violence:    Fear of current or ex partner: Not on file    Emotionally abused: Not on file    Physically abused: Not on file    Forced sexual activity: Not on file  Other Topics Concern  . Not on file  Social History Narrative  . Not on file    Family History  Problem Relation Age of Onset  . Arthritis Mother   . Stroke Father   . Hypertension Father   . Heart disease Father 64       AMI  . Kidney disease Father        bladder ca congenital loss of kidney  . Arthritis Maternal Grandmother   . Early death Daughter 16       aspiration  . Cancer Brother        Scalp     Current Outpatient Medications:  .  acetaminophen (TYLENOL) 500 MG tablet, Take 500 mg by mouth every 6 (six) hours as needed., Disp: , Rfl:  .  Cholecalciferol (VITAMIN D-3) 1000 units CAPS, Take 1,000 Units by mouth daily., Disp: , Rfl:  .  clopidogrel (PLAVIX) 75 MG tablet, TAKE 1 TABLET(75 MG) BY MOUTH DAILY, Disp: 90 tablet, Rfl: 0 .  fluticasone (FLONASE) 50 MCG/ACT nasal spray, Place 2 sprays into both nostrils daily as needed for allergies or rhinitis. , Disp: , Rfl:  .  HYDROCORTISONE, TOPICAL, 2 % LOTN, Apply 1 application topically as needed (itching)., Disp: , Rfl:  .  JARDIANCE 10 MG TABS tablet, TAKE 1 TABLET BY MOUTH EVERY DAY, Disp:  90 tablet, Rfl: 0 .  latanoprost (XALATAN) 0.005 % ophthalmic solution, Instill 1 drop into both eyes in the evening, Disp: , Rfl: 0 .  losartan (COZAAR) 25 MG tablet, Take 1 tablet (25 mg total) by mouth daily., Disp: 90 tablet, Rfl: 1 .  Menthol, Topical Analgesic, (BLUE-EMU MAXIMUM STRENGTH EX), Apply topically., Disp: , Rfl:  .  mirtazapine (REMERON) 7.5 MG tablet, TAKE 1 TABLET(7.5 MG) BY MOUTH AT BEDTIME, Disp: 90 tablet, Rfl: 1 .  nystatin (NYSTATIN) powder, Apply topically 2 (two) times daily. As needed for rash, Disp: 60 g, Rfl: 2 .  rosuvastatin (CRESTOR) 10 MG tablet, TAKE 1 TABLET BY MOUTH EVERY DAY,  Disp: 90 tablet, Rfl: 1 .  timolol (TIMOPTIC) 0.5 % ophthalmic solution, Instill 1 drop into the right eye in the morning, Disp: , Rfl: 0 .  ALPRAZolam (XANAX) 0.5 MG tablet, TAKE 1 TABLET(0.5 MG) BY MOUTH AT BEDTIME AS NEEDED FOR ANXIETY, Disp: 30 tablet, Rfl: 5 .  gabapentin (NEURONTIN) 100 MG capsule, , Disp: , Rfl:   No results found.  No images are attached to the encounter.   CMP Latest Ref Rng & Units 09/17/2018  Glucose 70 - 99 mg/dL 114(H)  BUN 6 - 23 mg/dL 27(H)  Creatinine 0.40 - 1.50 mg/dL 1.41  Sodium 135 - 145 mEq/L 141  Potassium 3.5 - 5.1 mEq/L 3.9  Chloride 96 - 112 mEq/L 104  CO2 19 - 32 mEq/L 26  Calcium 8.4 - 10.5 mg/dL 9.5  Total Protein 6.0 - 8.3 g/dL 6.5  Total Bilirubin 0.2 - 1.2 mg/dL 1.1  Alkaline Phos 39 - 117 U/L 77  AST 0 - 37 U/L 23  ALT 0 - 53 U/L 30   CBC Latest Ref Rng & Units 09/17/2018  WBC 4.0 - 10.5 K/uL 5.5  Hemoglobin 13.0 - 17.0 g/dL 18.4 Repeated and verified X2.(HH)  Hematocrit 39.0 - 52.0 % 53.4(H)  Platelets 150.0 - 400.0 K/uL 150.0     Observation/objective: Patient appears comfortable over his video visit at this time.  Breathing is nonlabored  Assessment and plan: Patient is a 83 year old male was been referred to [place] for polycythemia likely secondary  Patient has had longstanding polycythemia with hemoglobin around 18  dating back to 2014.  He has had Jak 2 mutation testing and exon 12 done in 2018 which was negative.  Patient does not have any known chronic lung disease, he is a lifelong non-smoker.  Cause of his polycythemia is currently unclear.  It would be difficult to perform test such as high affinity hemoglobin.  Also given his limited performance status due to his prior stroke a repeat bone marrow biopsy at this time would not be warranted.  We could consider doing a phlebotomy to keep his hematocrit less than 50 given that he had pontine stroke in the past although it is unclear if it was his polycythemia vera contributed in any way because it was secondary not primary and secondary polycythemia is typically not associated with increased risk of thromboembolic events.  Given patient's limited mobility patient does not wish to come for a phlebotomy and lab work at this time.  He would like to try to improve his oral hydration and get a repeat lab draw in 2 weeks.  Follow-up instructions: Patient will come for repeat blood work in 2 weeks including CBC with differential, CALR, MPL mutation and EPO level as well as UA.  If his hematocrit is more than 50 he will proceed with phlebotomy at that time  I discussed the assessment and treatment plan with the patient. The patient was provided an opportunity to ask questions and all were answered. The patient agreed with the plan and demonstrated an understanding of the instructions.   The patient was advised to call back or seek an in-person evaluation if the symptoms worsen or if the condition fails to improve as anticipated.  I provided 30 minutes of face-to-face video visit time during this encounter, and > 50% was spent counseling as documented under my assessment & plan.  Visit Diagnosis: 1. Polycythemia     Dr. Randa Evens, MD, MPH [place] [facility] [phone] 09/21/2018  4:23 PM

## 2018-09-21 NOTE — Telephone Encounter (Signed)
Refilled: 03/20/2018 Last OV: 09/15/2018 Next OV: not scheduled

## 2018-09-24 ENCOUNTER — Other Ambulatory Visit: Payer: Self-pay | Admitting: Internal Medicine

## 2018-09-25 ENCOUNTER — Other Ambulatory Visit: Payer: Self-pay

## 2018-09-25 ENCOUNTER — Other Ambulatory Visit: Payer: Medicare Other

## 2018-09-25 DIAGNOSIS — E1122 Type 2 diabetes mellitus with diabetic chronic kidney disease: Secondary | ICD-10-CM

## 2018-09-25 NOTE — Addendum Note (Signed)
Addended by: Leeanne Rio on: 09/25/2018 02:55 PM   Modules accepted: Orders

## 2018-09-26 LAB — MICROALBUMIN / CREATININE URINE RATIO
Creatinine, Urine: 113 mg/dL (ref 20–320)
Microalb Creat Ratio: 6 mcg/mg creat (ref <30)
Microalb, Ur: 0.7 mg/dL

## 2018-09-28 ENCOUNTER — Other Ambulatory Visit: Payer: Self-pay | Admitting: *Deleted

## 2018-09-28 ENCOUNTER — Other Ambulatory Visit: Payer: Self-pay | Admitting: Oncology

## 2018-09-28 ENCOUNTER — Other Ambulatory Visit: Payer: Self-pay

## 2018-09-28 ENCOUNTER — Inpatient Hospital Stay: Payer: Medicare Other

## 2018-09-28 VITALS — BP 147/88 | HR 80 | Temp 97.0°F | Resp 20

## 2018-09-28 DIAGNOSIS — I10 Essential (primary) hypertension: Secondary | ICD-10-CM | POA: Diagnosis not present

## 2018-09-28 DIAGNOSIS — D751 Secondary polycythemia: Secondary | ICD-10-CM

## 2018-09-28 DIAGNOSIS — Z79899 Other long term (current) drug therapy: Secondary | ICD-10-CM | POA: Diagnosis not present

## 2018-09-28 DIAGNOSIS — E119 Type 2 diabetes mellitus without complications: Secondary | ICD-10-CM | POA: Diagnosis not present

## 2018-09-28 DIAGNOSIS — Z8673 Personal history of transient ischemic attack (TIA), and cerebral infarction without residual deficits: Secondary | ICD-10-CM | POA: Diagnosis not present

## 2018-09-28 LAB — CBC
HCT: 52.5 % — ABNORMAL HIGH (ref 39.0–52.0)
Hemoglobin: 18.1 g/dL — ABNORMAL HIGH (ref 13.0–17.0)
MCH: 32.3 pg (ref 26.0–34.0)
MCHC: 34.5 g/dL (ref 30.0–36.0)
MCV: 93.6 fL (ref 80.0–100.0)
Platelets: 154 10*3/uL (ref 150–400)
RBC: 5.61 MIL/uL (ref 4.22–5.81)
RDW: 13 % (ref 11.5–15.5)
WBC: 7.8 10*3/uL (ref 4.0–10.5)
nRBC: 0 % (ref 0.0–0.2)

## 2018-09-29 LAB — ERYTHROPOIETIN: Erythropoietin: 16.8 m[IU]/mL (ref 2.6–18.5)

## 2018-10-02 ENCOUNTER — Other Ambulatory Visit: Payer: Self-pay

## 2018-10-02 DIAGNOSIS — D751 Secondary polycythemia: Secondary | ICD-10-CM | POA: Diagnosis not present

## 2018-10-02 LAB — URINALYSIS, COMPLETE (UACMP) WITH MICROSCOPIC
Bacteria, UA: NONE SEEN
Bilirubin Urine: NEGATIVE
Glucose, UA: >=500 mg/dL — AB
Hgb urine dipstick: NEGATIVE
Ketones, ur: NEGATIVE mg/dL
Leukocytes,Ua: NEGATIVE
Nitrite: NEGATIVE
Protein, ur: NEGATIVE mg/dL
Specific Gravity, Urine: 1.018 (ref 1.005–1.030)
Squamous Epithelial / HPF: NONE SEEN (ref 0–5)
pH: 6 (ref 5.0–8.0)

## 2018-10-02 LAB — MPL MUTATION ANALYSIS

## 2018-10-06 LAB — CALRETICULIN (CALR) MUTATION ANALYSIS

## 2018-10-12 ENCOUNTER — Inpatient Hospital Stay: Payer: Medicare Other

## 2018-10-13 ENCOUNTER — Inpatient Hospital Stay: Payer: Medicare Other | Attending: Oncology

## 2018-10-13 ENCOUNTER — Other Ambulatory Visit: Payer: Self-pay

## 2018-10-13 ENCOUNTER — Other Ambulatory Visit: Payer: Self-pay | Admitting: Internal Medicine

## 2018-10-13 ENCOUNTER — Telehealth: Payer: Self-pay | Admitting: *Deleted

## 2018-10-13 ENCOUNTER — Inpatient Hospital Stay: Payer: Medicare Other

## 2018-10-13 DIAGNOSIS — D751 Secondary polycythemia: Secondary | ICD-10-CM

## 2018-10-13 LAB — CBC
HCT: 50.9 % (ref 39.0–52.0)
Hemoglobin: 17.7 g/dL — ABNORMAL HIGH (ref 13.0–17.0)
MCH: 32.7 pg (ref 26.0–34.0)
MCHC: 34.8 g/dL (ref 30.0–36.0)
MCV: 94.1 fL (ref 80.0–100.0)
Platelets: 155 10*3/uL (ref 150–400)
RBC: 5.41 MIL/uL (ref 4.22–5.81)
RDW: 13.1 % (ref 11.5–15.5)
WBC: 5.8 10*3/uL (ref 4.0–10.5)
nRBC: 0 % (ref 0.0–0.2)

## 2018-10-13 NOTE — Telephone Encounter (Signed)
Called and spoke to wife about the hct 50.8 and per Dr/ Janese Banks he does not need phlebotomy today. Also we will cancel his appt 2 weeks from now on 5/18. His next appt already scheduled will be 6/1. She will let her husband know. That was great news she said

## 2018-10-26 ENCOUNTER — Inpatient Hospital Stay: Payer: Medicare Other

## 2018-10-28 ENCOUNTER — Telehealth: Payer: Self-pay | Admitting: Adult Health

## 2018-10-28 NOTE — Telephone Encounter (Signed)
5-20 Pt wife has called and she gave verbal consent to file insurance for [email or web address] vv  phone # to text confirmed [phone] [employer]  Pt understands that although there may be some limitations with this type of visit, we will take all precautions to reduce any security or privacy concerns.  Pt understands that this will be treated like an in office visit and we will file with pt's insurance, and there may be a patient responsible charge related to this service.

## 2018-10-29 ENCOUNTER — Other Ambulatory Visit: Payer: Self-pay

## 2018-10-29 ENCOUNTER — Ambulatory Visit (INDEPENDENT_AMBULATORY_CARE_PROVIDER_SITE_OTHER): Payer: Medicare Other | Admitting: Adult Health

## 2018-10-29 VITALS — BP 93/61 | HR 77 | Temp 97.6°F | Wt 147.0 lb

## 2018-10-29 DIAGNOSIS — I6350 Cerebral infarction due to unspecified occlusion or stenosis of unspecified cerebral artery: Secondary | ICD-10-CM | POA: Diagnosis not present

## 2018-10-29 DIAGNOSIS — I10 Essential (primary) hypertension: Secondary | ICD-10-CM

## 2018-10-29 DIAGNOSIS — G8191 Hemiplegia, unspecified affecting right dominant side: Secondary | ICD-10-CM

## 2018-10-29 DIAGNOSIS — E785 Hyperlipidemia, unspecified: Secondary | ICD-10-CM

## 2018-10-29 DIAGNOSIS — E1129 Type 2 diabetes mellitus with other diabetic kidney complication: Secondary | ICD-10-CM

## 2018-10-29 DIAGNOSIS — I639 Cerebral infarction, unspecified: Secondary | ICD-10-CM

## 2018-10-29 NOTE — Progress Notes (Signed)
[facility] [address] [place]. [place] [address] [phone]       FOLLOW UP NOTE  [name] Date of Birth:  12/11/1935 Medical Record Number:  [id number]   Reason for visit: Stroke follow up  Virtual Visit via Video Note  I connected with Anthony Allen on 11/03/18 at  3:45 PM EDT by a video enabled telemedicine application located remotely in my own home and verified that I am speaking with the correct person using two identifiers who was located at their own home and accompanied by his wife.   Visit facilitated by Lemont Fillers, RN who discussed the limitations of evaluation and management by telemedicine and the availability of in person appointments. The patient expressed understanding and agreed to proceed.    HPI:   Anthony Allen continues to be followed in our office regarding left pontine stroke in 05/2017.  He was initially scheduled today for office visit follow-up but due to COVID-19 safety precautions, visit transitioned to telemedicine via [email or web address] with patient's consent.  Anthony Allen is a 83 year old male with PMH of DM 2 with CKD, HTN and HLD who was admitted to [facility] on 05/31/2017 with diagnosis of left pontine infarct who initially improved represented to [facility] health ED on 06/03/2017 due to neurological symptom worsening. Repeat imaging did not show new infarct and likely recrudescence from recent pontine stroke.  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 2D echo unremarkable.  Bilateral carotid ultrasound showed mild amount of calcified plaque at the right carotid bulb and proximal right ICA, minimal plaque at the level of the left carotid bulb and left ICA origin, and  estimated bilateral ICA stenosis less than 50%.  Initiated DAPT for 3 weeks then Plavix alone.  Discharge to SNF for ongoing therapy.  Ongoing residual deficits of right hemiparesis with improvement initially but at today's visit, subjectively denies ongoing improvement.  Wife endorses mild improvement of his gait along with improvement of his confidence of ambulating with rolling walker.  Continues to ambulate with rolling walker without any recent falls.  Continues to live at home with his wife who assist with daily activities.  At prior visit, wife questioning potential additional therapy due to ongoing deficits but due to dental procedures, this was placed on hold.  He has since completed dental work without complication and continues on Plavix without side effects of bleeding or bruising.  Continues on Crestor 10 mg without side effects myalgias.  Recent lipid panel showed LDL 27.  A1c 5.9.  Blood pressure continuously monitored at home with today's home reading 93/61.  He continues to be followed by PCP with recent adjustments to antihypertensives due to episodes of hypotension.  He does endorse improvement as typically ranging 90-1 10 where previously SBP 80s.  No further concerns at this time.  Denies new or worsening stroke/TIA symptoms     ROS:   14 system review of systems performed and negative with exception of weakness and walking difficulty  PMH:  Past Medical History:  Diagnosis Date   Arthritis    "mild in my hands" (00/37/0488)   Complication of anesthesia    "was told never to use Propofol because of parent's adverse reaction" (06/04/2017)   CVA (cerebral vascular accident) ([place])  hospitalized from 12/22-12/24 due to left-sided weakness, slurred speech and difficulty swallowing. He was diagnosed as left pontine stroke./notes 06/03/2017   Family history of adverse reaction to anesthesia    "mother and dad had allergic reaction Propofol" (06/04/2017)   Glaucoma, both eyes     Gout    Hypertension    Parotid mass    Stroke (cerebrum) ([place]) 06/03/2017   Type 2 diabetes mellitus with hyperglycemia (HCC)     PSH:  Past Surgical History:  Procedure Laterality Date   TONSILLECTOMY  1943    Social History:  Social History   Socioeconomic History   Marital status: Married    Spouse name: Not on file   Number of children: Not on file   Years of education: Not on file   Highest education level: Not on file  Occupational History   Not on file  Social Needs   Financial resource strain: Not on file   Food insecurity:    Worry: Not on file    Inability: Not on file   Transportation needs:    Medical: Not on file    Non-medical: Not on file  Tobacco Use   Smoking status: Never Smoker   Smokeless tobacco: Never Used  Substance and Sexual Activity   Alcohol use: Yes    Comment: 06/04/2017 "might have 1 drink q 2 wk; if that"   Drug use: No   Sexual activity: Not Currently  Lifestyle   Physical activity:    Days per week: Not on file    Minutes per session: Not on file   Stress: Not on file  Relationships   Social connections:    Talks on phone: Not on file    Gets together: Not on file    Attends religious service: Not on file    Active member of club or organization: Not on file    Attends meetings of clubs or organizations: Not on file    Relationship status: Not on file   Intimate partner violence:    Fear of current or ex partner: Not on file    Emotionally abused: Not on file    Physically abused: Not on file    Forced sexual activity: Not on file  Other Topics Concern   Not on file  Social History Narrative   Not on file    Family History:  Family History  Problem Relation Age of Onset   Arthritis Mother    Stroke Father    Hypertension Father    Heart disease Father 72       AMI   Kidney disease Father        bladder ca congenital loss of kidney   Arthritis Maternal Grandmother    Early  death Daughter 57       aspiration   Cancer Brother        Scalp    Medications:   Current Outpatient Medications on File Prior to Visit  Medication Sig Dispense Refill   acetaminophen (TYLENOL) 500 MG tablet Take 500 mg by mouth every 6 (six) hours as needed.     ALPRAZolam (XANAX) 0.5 MG tablet TAKE 1 TABLET(0.5 MG) BY MOUTH AT BEDTIME AS NEEDED FOR ANXIETY 30 tablet 5   Cholecalciferol (VITAMIN D-3) 1000 units CAPS Take 1,000 Units by mouth daily.     clopidogrel (PLAVIX) 75 MG tablet TAKE 1 TABLET(75 MG) BY MOUTH DAILY 90 tablet 0   fluticasone (FLONASE) 50 MCG/ACT nasal spray Place 2 sprays into both  nostrils daily as needed for allergies or rhinitis.      gabapentin (NEURONTIN) 100 MG capsule      HYDROCORTISONE, TOPICAL, 2 % LOTN Apply 1 application topically as needed (itching).     JARDIANCE 10 MG TABS tablet TAKE 1 TABLET BY MOUTH EVERY DAY 90 tablet 0   latanoprost (XALATAN) 0.005 % ophthalmic solution Instill 1 drop into both eyes in the evening  0   losartan (COZAAR) 25 MG tablet Take 1 tablet (25 mg total) by mouth daily. 90 tablet 1   Menthol, Topical Analgesic, (BLUE-EMU MAXIMUM STRENGTH EX) Apply topically.     mirtazapine (REMERON) 7.5 MG tablet TAKE 1 TABLET(7.5 MG) BY MOUTH AT BEDTIME 90 tablet 1   NYSTATIN powder APPLY TOPICALLY TWICE A DAY AS NEEDED FOR RASH 60 g 2   rosuvastatin (CRESTOR) 10 MG tablet TAKE 1 TABLET BY MOUTH EVERY DAY 90 tablet 1   timolol (TIMOPTIC) 0.5 % ophthalmic solution Instill 1 drop into the right eye in the morning  0   No current facility-administered medications on file prior to visit.     Allergies:  No Known Allergies  Physical Exam  Vitals:   10/29/18 1548  BP: 93/61  Pulse: 77  Temp: 97.6 F (36.4 C)  Weight: 147 lb (66.7 kg)   Body mass index is 21.71 kg/m. No exam data present  General: well developed, pleasant elderly Caucasian male, well nourished, seated, in no evident distress Head: Frontal nodule  present along with right mandible nodule (chronic)   Neurologic Exam Mental Status: Awake and fully alert. Oriented to place and time. Recent and remote memory intact. Attention span, concentration and fund of knowledge appropriate. Mood and affect appropriate.  Cranial Nerves: Extraocular movements full without evidence of nystagmus. Hearing intact to voice. Face, tongue, palate moves normally and symmetrically.  Motor: Mild RUE and RLE weakness with mild drifting present.  Decreased right hand finger dexterity and right foot toe tapping.  Unable to stand from seated position with arms crossed.  No evidence of weakness in LUE or LLE Sensory.: intact to light touch Coordination: Decreased right hand dexterity.  Orbits left arm over right arm.  Finger-to-nose and heel-to-shin performed accurately in left upper and lower extremity.   Gait and Station: Ambulates with rolling walker with normal-sized steps Reflexes: UTA   Diagnostic Data (Labs, Imaging, Testing)  Ct Head Wo Contrast Result Date: 06/03/2017 IMPRESSION: Mild chronic ischemic white matter disease. Possible low density seen in left pons which may correspond to infarction seen on prior MRI. No other abnormality is seen.  Ct Angio Head and Neck W Or Wo Contrast Result Date: 06/03/2017 IMPRESSION: 1. No acute intracranial finding when compared to brain MRI and MRA 2 days prior. 2. A known acute left pontine infarct is subtle by CT. 3. 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 Notable irregular plaque at the proximal left subclavian. 4. 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 5. Known left parapharyngeal and deep parotid mass/neoplasm. ENT referral has been recommended previously. 6. Alveolar ridge and body erosion of the left mandible, recommend mucosal exam to exclude underlying destructive lesion.  Echocardiogram: 06/01/2017 Study Conclusions  Left ventricle: The cavity size was normal. Wall thickness  was normal. Systolic function was vigorous. The estimated ejection fraction was in the range of 65% to 70%. Wall motion was normal; there were no regional wall motion abnormalities. Doppler parameters are consistent with abnormal left ventricular relaxation (grade 1 diastolic dysfunction). - Mitral valve: Valve area  by pressure half-time: 2.27 cm^2. Impressions: - Normal Overall LVF Normal Wall Motion EF=65% Normal Right side. Normal study.  B/L Carotid U/S:   IMPRESSION: Mild amount of calcified plaque at the level of the right carotid bulb and proximal right ICA. Minimal plaque at the level of the left carotid bulb and left ICA origin. Estimated bilateral ICA stenoses are less than 50%.     ASSESSMENT: 83 y.o. year old male here with left pontine stroke at outside hospital on 05/31/2017 and presented to [facility] with increased right-sided weakness but negative for new stroke on 06/01/2017 likely recurrence from recent left pontine lacunar stroke due to small vessel disease but he also has concurrent intracranial atherosclerosis.  Vascular risk factors include diabetes mellitus, hyperlipidemia , intracranial atherosclerosis and hypertension.  Residual deficits of right hemiparesis.   PLAN: -Continue clopidogrel 75 mg daily  and Crestor for secondary stroke prevention -F/u with PCP regarding your HLD, DM and HTN management -continue to monitor BP at home -Continue to stay active as tolerated and maintain a healthy diet along with continuing to do therapy exercises at home -Maintain strict control of hypertension with blood pressure goal below 130/90, diabetes with hemoglobin A1c goal below 6.5% and cholesterol with LDL cholesterol (bad cholesterol) goal below 70 mg/dL. I also advised the patient to eat a healthy diet with plenty of whole grains, cereals, fruits and vegetables, exercise regularly and maintain ideal body weight.  Stable from stroke standpoint and  recommend follow-up as needed   Greater than 50% of time during this 25 minute non-face-to-face visit was spent on counseling, explanation of diagnosis of left pontine stroke, reviewing risk factor management of DM, HLD, and HTN, planning of further management, discussion with patient and family and coordination of care  Venancio Poisson, [facility]  [facility] [address] [place], [place] [address]  Phone [phone] Fax [phone]

## 2018-11-03 ENCOUNTER — Encounter: Payer: Self-pay | Admitting: Adult Health

## 2018-11-06 ENCOUNTER — Other Ambulatory Visit: Payer: Self-pay

## 2018-11-09 ENCOUNTER — Other Ambulatory Visit: Payer: Self-pay

## 2018-11-09 ENCOUNTER — Inpatient Hospital Stay: Payer: Medicare Other

## 2018-11-09 ENCOUNTER — Inpatient Hospital Stay: Payer: Medicare Other | Attending: Oncology

## 2018-11-09 VITALS — BP 164/77 | HR 81 | Temp 97.7°F | Resp 18

## 2018-11-09 DIAGNOSIS — D751 Secondary polycythemia: Secondary | ICD-10-CM

## 2018-11-09 LAB — CBC
HCT: 51.5 % (ref 39.0–52.0)
Hemoglobin: 17.7 g/dL — ABNORMAL HIGH (ref 13.0–17.0)
MCH: 31.8 pg (ref 26.0–34.0)
MCHC: 34.4 g/dL (ref 30.0–36.0)
MCV: 92.6 fL (ref 80.0–100.0)
Platelets: 149 10*3/uL — ABNORMAL LOW (ref 150–400)
RBC: 5.56 MIL/uL (ref 4.22–5.81)
RDW: 12.7 % (ref 11.5–15.5)
WBC: 6.2 10*3/uL (ref 4.0–10.5)
nRBC: 0 % (ref 0.0–0.2)

## 2018-11-09 LAB — GLUCOSE, CAPILLARY: Glucose-Capillary: 156 mg/dL — ABNORMAL HIGH (ref 70–99)

## 2018-11-09 MED ORDER — SODIUM CHLORIDE 0.9 % IV SOLN
Freq: Once | INTRAVENOUS | Status: AC
  Administered 2018-11-09: 14:00:00 via INTRAVENOUS
  Filled 2018-11-09: qty 250

## 2018-11-09 NOTE — Progress Notes (Signed)
1350: Per MD office note Phlebotomy to be performed if Hematocrit is over 50. Today's Hematocrit is 51.5. Therapeutic phlebotomy performed per MD order removing 250 cc using 20 gauge PIV in left AC. Pt tolerated procedure well. Snack and drink offered and declined by pt.   At Approximately 1410: Pt B/P 63/33, pt appears pale in color and skin is clammy to touch. Pt responsive and reports feeling "cold and clammy". Pt reclined back and PIV started and NS started, Beckey Rutter NP aware and at chairside. 1425: Orange Juice provided.   1428: CBG performed per Beckey Rutter. CBG - 156. Pt stable and VS stable at this time.  1455: Per Beckey Rutter okay to stop IVFs, Pt and B/P stable. Pt denies any s/s of distress, pt declines dizziness when transferring from clinic chair to wheelchair.  1500: Pt okay to discharge per Beckey Rutter NP, Pt and VS stable. Pt denies any complaints at this time.

## 2018-11-09 NOTE — Progress Notes (Signed)
I agree with the above plan 

## 2018-11-11 ENCOUNTER — Other Ambulatory Visit: Payer: Self-pay

## 2018-11-11 MED ORDER — CLOPIDOGREL BISULFATE 75 MG PO TABS: ORAL_TABLET | ORAL | 1 refills | Status: DC

## 2018-11-11 MED ORDER — EMPAGLIFLOZIN 10 MG PO TABS: 10.0000 mg | ORAL_TABLET | Freq: Every day | ORAL | 1 refills | Status: DC

## 2018-11-23 ENCOUNTER — Inpatient Hospital Stay: Payer: Medicare Other

## 2018-11-23 ENCOUNTER — Ambulatory Visit: Payer: Self-pay | Admitting: Oncology

## 2018-11-30 ENCOUNTER — Encounter: Payer: Self-pay | Admitting: Oncology

## 2018-11-30 ENCOUNTER — Inpatient Hospital Stay (HOSPITAL_BASED_OUTPATIENT_CLINIC_OR_DEPARTMENT_OTHER): Payer: Medicare Other | Admitting: Oncology

## 2018-11-30 ENCOUNTER — Inpatient Hospital Stay: Payer: Medicare Other

## 2018-11-30 ENCOUNTER — Other Ambulatory Visit: Payer: Self-pay

## 2018-11-30 VITALS — BP 150/82 | HR 67 | Temp 98.1°F | Wt 149.0 lb

## 2018-11-30 DIAGNOSIS — D751 Secondary polycythemia: Secondary | ICD-10-CM

## 2018-11-30 LAB — CBC
HCT: 49.3 % (ref 39.0–52.0)
Hemoglobin: 17 g/dL (ref 13.0–17.0)
MCH: 32.1 pg (ref 26.0–34.0)
MCHC: 34.5 g/dL (ref 30.0–36.0)
MCV: 93 fL (ref 80.0–100.0)
Platelets: 187 10*3/uL (ref 150–400)
RBC: 5.3 MIL/uL (ref 4.22–5.81)
RDW: 13.1 % (ref 11.5–15.5)
WBC: 7.6 10*3/uL (ref 4.0–10.5)
nRBC: 0 % (ref 0.0–0.2)

## 2018-12-01 NOTE — Progress Notes (Signed)
Hematology/Oncology Consult note [facility]  Telephone:([phone] [phone]  Patient Care Team: Crecencio Mc, MD as PCP - General (Internal Medicine)   Name of the patient: Anthony Allen  [id number]  [birth year]   Date of visit: 12/01/18  Diagnosis-secondary polycythemia of unclear etiology  Chief complaint/ Reason for visit-routine follow-up of polycythemia  Heme/Onc history: patient is a 83 year old male with a prior history of pontine stroke in 2018.  He has been referred to [place] for evaluation of polycythemia.  Of note patient has been evaluated for polycythemia in the past. Review of his prior CBCs reveals that he has had a high hemoglobin of around 18 even back in 2014.  He did have Jak 2 exon 12 mutation checked in 2018 which was negative.  Most recent CBC from 09/17/2018 showed a white count of 5.5, H&H of 18.4/53.4 and a platelet count of 150.  Patient has been a lifelong non-smoker.  He lives with his wife and does have some difficulty and needs assistance with his ADLs as well due to this stroke.  He is not on any testosterone replacement therapy.  Does not have any known chronic lung disease.  Overall his weight has been stable and he denies any blood in his urine.    Interval history-felt cold and clammy after he received his last phlebotomy.  Otherwise he feels at his baseline.  He reports chronic fatigue.  ECOG PS- 2 Pain scale- 0  Review of systems- Review of Systems  Constitutional: Positive for malaise/fatigue. Negative for chills, fever and weight loss.  HENT: Negative for congestion, ear discharge and nosebleeds.   Eyes: Negative for blurred vision.  Respiratory: Negative for cough, hemoptysis, sputum production, shortness of breath and wheezing.   Cardiovascular: Negative for chest pain, palpitations, orthopnea and claudication.  Gastrointestinal: Negative for abdominal pain, blood in stool, constipation, diarrhea,  heartburn, melena, nausea and vomiting.  Genitourinary: Negative for dysuria, flank pain, frequency, hematuria and urgency.  Musculoskeletal: Negative for back pain, joint pain and myalgias.  Skin: Negative for rash.  Neurological: Negative for dizziness, tingling, focal weakness, seizures, weakness and headaches.  Endo/Heme/Allergies: Does not bruise/bleed easily.  Psychiatric/Behavioral: Negative for depression and suicidal ideas. The patient does not have insomnia.       No Known Allergies   Past Medical History:  Diagnosis Date  . Arthritis    "mild in my hands" (06/04/2017)  . Complication of anesthesia    "was told never to use Propofol because of parent's adverse reaction" (06/04/2017)  . CVA (cerebral vascular accident) ([place])    hospitalized from 12/22-12/24 due to left-sided weakness, slurred speech and difficulty swallowing. He was diagnosed as left pontine stroke./notes 06/03/2017  . Family history of adverse reaction to anesthesia    "mother and dad had allergic reaction Propofol" (06/04/2017)  . Glaucoma, both eyes   . Gout   . Hypertension   . Parotid mass   . Stroke (cerebrum) ([place]) 06/03/2017  . Type 2 diabetes mellitus with hyperglycemia [facility])      Past Surgical History:  Procedure Laterality Date  . TONSILLECTOMY  1943    Social History   Socioeconomic History  . Marital status: Married    Spouse name: Not on file  . Number of children: Not on file  . Years of education: Not on file  . Highest education level: Not on file  Occupational History  . Not on file  Social Needs  . Financial resource strain: Not  on file  . Food insecurity    Worry: Not on file    Inability: Not on file  . Transportation needs    Medical: Not on file    Non-medical: Not on file  Tobacco Use  . Smoking status: Never Smoker  . Smokeless tobacco: Never Used  Substance and Sexual Activity  . Alcohol use: Yes    Comment: 06/04/2017 "might have 1 drink q 2 wk; if that"   . Drug use: No  . Sexual activity: Not Currently  Lifestyle  . Physical activity    Days per week: Not on file    Minutes per session: Not on file  . Stress: Not on file  Relationships  . Social Herbalist on phone: Not on file    Gets together: Not on file    Attends religious service: Not on file    Active member of club or organization: Not on file    Attends meetings of clubs or organizations: Not on file    Relationship status: Not on file  . Intimate partner violence    Fear of current or ex partner: Not on file    Emotionally abused: Not on file    Physically abused: Not on file    Forced sexual activity: Not on file  Other Topics Concern  . Not on file  Social History Narrative  . Not on file    Family History  Problem Relation Age of Onset  . Arthritis Mother   . Stroke Father   . Hypertension Father   . Heart disease Father 49       AMI  . Kidney disease Father        bladder ca congenital loss of kidney  . Arthritis Maternal Grandmother   . Early death Daughter 16       aspiration  . Cancer Brother        Scalp     Current Outpatient Medications:  .  acetaminophen (TYLENOL) 500 MG tablet, Take 500 mg by mouth every 6 (six) hours as needed., Disp: , Rfl:  .  ALPRAZolam (XANAX) 0.5 MG tablet, TAKE 1 TABLET(0.5 MG) BY MOUTH AT BEDTIME AS NEEDED FOR ANXIETY, Disp: 30 tablet, Rfl: 5 .  Cholecalciferol (VITAMIN D-3) 1000 units CAPS, Take 1,000 Units by mouth daily., Disp: , Rfl:  .  clopidogrel (PLAVIX) 75 MG tablet, TAKE 1 TABLET(75 MG) BY MOUTH DAILY, Disp: 90 tablet, Rfl: 1 .  empagliflozin (JARDIANCE) 10 MG TABS tablet, Take 10 mg by mouth daily., Disp: 90 tablet, Rfl: 1 .  fluticasone (FLONASE) 50 MCG/ACT nasal spray, Place 2 sprays into both nostrils daily as needed for allergies or rhinitis. , Disp: , Rfl:  .  latanoprost (XALATAN) 0.005 % ophthalmic solution, Instill 1 drop into both eyes in the evening, Disp: , Rfl: 0 .  losartan (COZAAR) 25  MG tablet, Take 1 tablet (25 mg total) by mouth daily., Disp: 90 tablet, Rfl: 1 .  Menthol, Topical Analgesic, (BLUE-EMU MAXIMUM STRENGTH EX), Apply topically., Disp: , Rfl:  .  mirtazapine (REMERON) 7.5 MG tablet, TAKE 1 TABLET(7.5 MG) BY MOUTH AT BEDTIME, Disp: 90 tablet, Rfl: 1 .  NYSTATIN powder, APPLY TOPICALLY TWICE A DAY AS NEEDED FOR RASH, Disp: 60 g, Rfl: 2 .  rosuvastatin (CRESTOR) 10 MG tablet, TAKE 1 TABLET BY MOUTH EVERY DAY, Disp: 90 tablet, Rfl: 1 .  timolol (TIMOPTIC) 0.5 % ophthalmic solution, Instill 1 drop into the right eye in the morning,  Disp: , Rfl: 0 .  gabapentin (NEURONTIN) 100 MG capsule, , Disp: , Rfl:  .  HYDROCORTISONE, TOPICAL, 2 % LOTN, Apply 1 application topically as needed (itching)., Disp: , Rfl:   Physical exam:  Vitals:   11/30/18 1415  BP: (!) 150/82  Pulse: 67  Temp: 98.1 F (36.7 C)  TempSrc: Tympanic  Weight: 149 lb (67.6 kg)   Physical Exam Constitutional:      Comments: Frail elderly gentleman sitting in a wheelchair.  Appears fatigued  HENT:     Head: Normocephalic and atraumatic.  Eyes:     Pupils: Pupils are equal, round, and reactive to light.  Neck:     Musculoskeletal: Normal range of motion.  Cardiovascular:     Rate and Rhythm: Normal rate and regular rhythm.     Heart sounds: Normal heart sounds.  Pulmonary:     Effort: Pulmonary effort is normal.     Breath sounds: Normal breath sounds.  Abdominal:     General: Bowel sounds are normal.     Palpations: Abdomen is soft.  Skin:    General: Skin is warm and dry.  Neurological:     Mental Status: He is alert and oriented to person, place, and time.      CMP Latest Ref Rng & Units 09/17/2018  Glucose 70 - 99 mg/dL 114(H)  BUN 6 - 23 mg/dL 27(H)  Creatinine 0.40 - 1.50 mg/dL 1.41  Sodium 135 - 145 mEq/L 141  Potassium 3.5 - 5.1 mEq/L 3.9  Chloride 96 - 112 mEq/L 104  CO2 19 - 32 mEq/L 26  Calcium 8.4 - 10.5 mg/dL 9.5  Total Protein 6.0 - 8.3 g/dL 6.5  Total Bilirubin  0.2 - 1.2 mg/dL 1.1  Alkaline Phos 39 - 117 U/L 77  AST 0 - 37 U/L 23  ALT 0 - 53 U/L 30   CBC Latest Ref Rng & Units 11/30/2018  WBC 4.0 - 10.5 K/uL 7.6  Hemoglobin 13.0 - 17.0 g/dL 17.0  Hematocrit 39.0 - 52.0 % 49.3  Platelets 150 - 400 K/uL 187     Assessment and plan- Patient is a 83 y.o. male with secondary polycythemia of unclear etiology  Presently his hematocrit is less than 50.  Last time when he had his phlebotomy patient felt cold and clammy and had to receive IV fluids.  I will hold off on phlebotomy at this time.  Repeat CBC in 1 month and 2 months and I will see him back in 2 months.  Consider phlebotomy if hematocrit more than 50-55   Visit Diagnosis 1. Polycythemia, secondary      Dr. Randa Evens, MD, MPH [facility] at [facility] [phone] 12/01/2018 11:51 AM

## 2018-12-24 LAB — HM DIABETES EYE EXAM

## 2018-12-28 ENCOUNTER — Other Ambulatory Visit: Payer: Self-pay

## 2018-12-28 ENCOUNTER — Inpatient Hospital Stay: Payer: Medicare Other | Attending: Oncology

## 2018-12-28 ENCOUNTER — Inpatient Hospital Stay: Payer: Medicare Other

## 2018-12-28 DIAGNOSIS — D751 Secondary polycythemia: Secondary | ICD-10-CM | POA: Diagnosis not present

## 2018-12-28 LAB — CBC
HCT: 52.5 % — ABNORMAL HIGH (ref 39.0–52.0)
Hemoglobin: 17.6 g/dL — ABNORMAL HIGH (ref 13.0–17.0)
MCH: 31.4 pg (ref 26.0–34.0)
MCHC: 33.5 g/dL (ref 30.0–36.0)
MCV: 93.6 fL (ref 80.0–100.0)
Platelets: 169 10*3/uL (ref 150–400)
RBC: 5.61 MIL/uL (ref 4.22–5.81)
RDW: 13.2 % (ref 11.5–15.5)
WBC: 6.4 10*3/uL (ref 4.0–10.5)
nRBC: 0 % (ref 0.0–0.2)

## 2019-02-01 ENCOUNTER — Other Ambulatory Visit: Payer: Self-pay

## 2019-02-01 ENCOUNTER — Inpatient Hospital Stay: Payer: Medicare Other

## 2019-02-01 ENCOUNTER — Inpatient Hospital Stay: Payer: Medicare Other | Attending: Oncology

## 2019-02-01 VITALS — BP 116/7 | HR 90

## 2019-02-01 DIAGNOSIS — D751 Secondary polycythemia: Secondary | ICD-10-CM | POA: Insufficient documentation

## 2019-02-01 LAB — CBC
HCT: 50.7 % (ref 39.0–52.0)
Hemoglobin: 17.2 g/dL — ABNORMAL HIGH (ref 13.0–17.0)
MCH: 31 pg (ref 26.0–34.0)
MCHC: 33.9 g/dL (ref 30.0–36.0)
MCV: 91.5 fL (ref 80.0–100.0)
Platelets: 178 10*3/uL (ref 150–400)
RBC: 5.54 MIL/uL (ref 4.22–5.81)
RDW: 13.2 % (ref 11.5–15.5)
WBC: 6.8 10*3/uL (ref 4.0–10.5)
nRBC: 0 % (ref 0.0–0.2)

## 2019-02-02 ENCOUNTER — Other Ambulatory Visit: Payer: Self-pay

## 2019-02-02 MED ORDER — ROSUVASTATIN CALCIUM 10 MG PO TABS: 10.0000 mg | ORAL_TABLET | Freq: Every day | ORAL | 1 refills | Status: DC

## 2019-03-03 ENCOUNTER — Ambulatory Visit (INDEPENDENT_AMBULATORY_CARE_PROVIDER_SITE_OTHER): Payer: Medicare Other

## 2019-03-03 ENCOUNTER — Other Ambulatory Visit: Payer: Self-pay

## 2019-03-03 DIAGNOSIS — Z23 Encounter for immunization: Secondary | ICD-10-CM

## 2019-03-16 ENCOUNTER — Other Ambulatory Visit: Payer: Self-pay

## 2019-03-16 NOTE — Telephone Encounter (Signed)
Refilled: 09/21/2018 Last OV: 09/15/2018 Next OV: not scheduled

## 2019-03-17 MED ORDER — ALPRAZOLAM 0.5 MG PO TABS: ORAL_TABLET | ORAL | 5 refills | Status: DC

## 2019-03-24 ENCOUNTER — Other Ambulatory Visit: Payer: Self-pay

## 2019-03-24 DIAGNOSIS — I10 Essential (primary) hypertension: Secondary | ICD-10-CM

## 2019-03-24 MED ORDER — LOSARTAN POTASSIUM 25 MG PO TABS: 25.0000 mg | ORAL_TABLET | Freq: Every day | ORAL | 1 refills | Status: DC

## 2019-04-16 ENCOUNTER — Other Ambulatory Visit: Payer: Self-pay | Admitting: *Deleted

## 2019-04-16 NOTE — Telephone Encounter (Signed)
I have pended order and set pharmacy but if I send it goes to print.

## 2019-04-16 NOTE — Telephone Encounter (Signed)
Copied from [place] [phone]. Topic: General - Inquiry >> Apr 16, 2019  5:04 PM Percell Belt A wrote: Reason for CRM: pt wife called in and would like to move the script for ALPRAZolam Duanne Moron) 0.5 MG tablet [id number] [facility]  THis med is on over a month back order  [address]

## 2019-04-18 MED ORDER — ALPRAZOLAM 0.5 MG PO TABS: ORAL_TABLET | ORAL | 5 refills | Status: DC

## 2019-05-14 ENCOUNTER — Other Ambulatory Visit: Payer: Self-pay | Admitting: Internal Medicine

## 2019-05-14 NOTE — Telephone Encounter (Signed)
Requested medication (s) are due for refill today: yes  Requested medication (s) are on the active medication list: yes  Last refill:  11/11/2018  Future visit scheduled: no  Notes to clinic: overdue for follow up Review for refill   Requested Prescriptions  Pending Prescriptions Disp Refills   clopidogrel (PLAVIX) 75 MG tablet 90 tablet 1    Sig: TAKE 1 TABLET(75 MG) BY MOUTH DAILY     Hematology: Antiplatelets - clopidogrel Failed - 05/14/2019 12:50 PM      Failed - Evaluate AST, ALT within 2 months of therapy initiation.      Failed - HGB in normal range and within 180 days    Hemoglobin  Date Value Ref Range Status  02/01/2019 17.2 (H) 13.0 - 17.0 g/dL Final         Failed - Valid encounter within last 6 months    Recent Outpatient Visits          8 months ago Type 2 diabetes mellitus with diabetic chronic kidney disease, unspecified CKD stage, unspecified whether long term insulin use ([place])   [facility] Crecencio Mc, MD   1 year ago Type 2 diabetes mellitus with other diabetic kidney complication, without long-term current use of insulin ([place])   [facility] Crecencio Mc, MD   1 year ago Type 2 diabetes mellitus with other diabetic kidney complication, without long-term current use of insulin ([place])    [facility] Crecencio Mc, MD   1 year ago Adjustment disorder with depressed mood   [place], Teresa L, MD   1 year ago Essential hypertension, benign   [place] Crecencio Mc, MD             Passed - ALT in normal range and within 360 days    ALT  Date Value Ref Range Status  09/17/2018 30 0 - 53 U/L Final         Passed - AST in normal range and within 360 days    AST  Date Value Ref Range Status  09/17/2018 23 0 - 37 U/L Final         Passed - HCT in normal range and within 180 days    HCT  Date Value Ref Range Status  02/01/2019 50.7  39.0 - 52.0 % Final         Passed - PLT in normal range and within 180 days    Platelets  Date Value Ref Range Status  02/01/2019 178 150 - 400 K/uL Final          empagliflozin (JARDIANCE) 10 MG TABS tablet 90 tablet 1    Sig: Take 10 mg by mouth daily.     Endocrinology:  Diabetes - SGLT2 Inhibitors Failed - 05/14/2019 12:50 PM      Failed - HBA1C is between 0 and 7.9 and within 180 days    HbA1c, POC (controlled diabetic range)  Date Value Ref Range Status  12/03/2017 5.9 0.0 - 7.0 % Final   Hgb A1c MFr Bld  Date Value Ref Range Status  09/17/2018 5.9 4.6 - 6.5 % Final    Comment:    Glycemic Control Guidelines for People with Diabetes:Non Diabetic:  <6%Goal of Therapy: <7%Additional Action Suggested:  >8%          Failed - eGFR in normal range and within 360 days    GFR calc Af [place]nia Lora  Date  Value Ref Range Status  06/03/2017 44 (L) >60 mL/min Final    Comment:    (NOTE) The eGFR has been calculated using the CKD EPI equation. This calculation has not been validated in all clinical situations. eGFR's persistently <60 mL/min signify possible Chronic Kidney Disease.    GFR calc non Af Amer  Date Value Ref Range Status  06/03/2017 38 (L) >60 mL/min Final   GFR  Date Value Ref Range Status  09/17/2018 48.30 (L) >60.00 mL/min Final         Failed - Valid encounter within last 6 months    Recent Outpatient Visits          8 months ago Type 2 diabetes mellitus with diabetic chronic kidney disease, unspecified CKD stage, unspecified whether long term insulin use ([place])   [facility] Crecencio Mc, MD   1 year ago Type 2 diabetes mellitus with other diabetic kidney complication, without long-term current use of insulin ([place])   [facility] Crecencio Mc, MD   1 year ago Type 2 diabetes mellitus with other diabetic kidney complication, without long-term current use of insulin ([place])   [facility] Crecencio Mc, MD   1 year ago Adjustment disorder with depressed mood   [facility]  Crecencio Mc, MD   1 year ago Essential hypertension, benign   [place] Crecencio Mc, MD             Passed - Cr in normal range and within 360 days    Creatinine, Ser  Date Value Ref Range Status  09/17/2018 1.41 0.40 - 1.50 mg/dL Final         Passed - LDL in normal range and within 360 days    LDL Cholesterol  Date Value Ref Range Status  09/17/2018 27 0 - 99 mg/dL Final

## 2019-05-14 NOTE — Telephone Encounter (Signed)
Medication: clopidogrel (PLAVIX) 75 MG tablet [id number] , empagliflozin (JARDIANCE) 10 MG TABS tablet [id number]   Has the patient contacted their pharmacy? Yes  (Agent: If no, request that the patient contact the pharmacy for the refill.) (Agent: If yes, when and what did the pharmacy advise?)  Preferred Pharmacy (with phone number or street name): [id number] - [place], [place] - [place] [phone] (Phone) [phone] (Fax)    Agent: Please be advised that RX refills may take up to 3 business days. We ask that you follow-up with your pharmacy.

## 2019-05-17 MED ORDER — JARDIANCE 10 MG PO TABS: 10.0000 mg | ORAL_TABLET | Freq: Every day | ORAL | 1 refills | Status: DC

## 2019-05-17 MED ORDER — CLOPIDOGREL BISULFATE 75 MG PO TABS: ORAL_TABLET | ORAL | 1 refills | Status: DC

## 2019-07-07 ENCOUNTER — Ambulatory Visit: Payer: Medicare Other | Admitting: Internal Medicine

## 2019-07-26 ENCOUNTER — Ambulatory Visit: Payer: Self-pay | Admitting: Internal Medicine

## 2019-07-30 ENCOUNTER — Other Ambulatory Visit: Payer: Self-pay

## 2019-07-30 MED ORDER — ROSUVASTATIN CALCIUM 10 MG PO TABS: 10.0000 mg | ORAL_TABLET | Freq: Every day | ORAL | 1 refills | Status: DC

## 2019-08-24 DIAGNOSIS — H40003 Preglaucoma, unspecified, bilateral: Secondary | ICD-10-CM | POA: Diagnosis not present

## 2019-08-25 ENCOUNTER — Telehealth (INDEPENDENT_AMBULATORY_CARE_PROVIDER_SITE_OTHER): Payer: Medicare PPO | Admitting: Internal Medicine

## 2019-08-25 ENCOUNTER — Encounter: Payer: Self-pay | Admitting: Internal Medicine

## 2019-08-25 VITALS — BP 90/59 | HR 89 | Temp 98.0°F | Ht 69.0 in | Wt 145.0 lb

## 2019-08-25 DIAGNOSIS — I10 Essential (primary) hypertension: Secondary | ICD-10-CM

## 2019-08-25 DIAGNOSIS — E1122 Type 2 diabetes mellitus with diabetic chronic kidney disease: Secondary | ICD-10-CM

## 2019-08-25 DIAGNOSIS — I6930 Unspecified sequelae of cerebral infarction: Secondary | ICD-10-CM | POA: Diagnosis not present

## 2019-08-25 DIAGNOSIS — D751 Secondary polycythemia: Secondary | ICD-10-CM

## 2019-08-27 NOTE — Assessment & Plan Note (Signed)
I am suspending his losartan for BP 90/59 and have asked Lynda to recheck his BP in 3 days.

## 2019-08-27 NOTE — Progress Notes (Signed)
Virtual Visit via [place] Note  This visit type was conducted due to national recommendations for restrictions regarding the COVID-19 pandemic (e.g. social distancing).  This format is felt to be most appropriate for this patient at this time.  All issues noted in this document were discussed and addressed.  No physical exam was performed (except for noted visual exam findings with Video Visits).   I connected with@ on 08/27/19 at  3:00 PM EDT by a video enabled telemedicine application or telephone and verified that I am speaking with the correct person using two identifiers. Location patient: home Location provider: work or home office Persons participating in the virtual visit: patient, provider  I discussed the limitations, risks, security and privacy concerns of performing an evaluation and management service by telephone and the availability of in person appointments. I also discussed with the patient that there may be a patient responsible charge related to this service. The patient expressed understanding and agreed to proceed.   Reason for visit: follow up on Type 2 DM, hypertension, hyperlipidemia,  History of left brain CVA   HPI:  84 yr old male with right sided hemiparesis following a left brain CVA in 2019, type 2 DM, hypertension , hyperlipidemia presents for follow up.  Last seen April 2020 .  / Patient has received both doses of the available COVID 19 vaccine without complications.  Patient continues to mask when outside of the home except when walking in yard or at safe distances from others .  Patient denies any change in mood or development of unhealthy behaviors resuting from the pandemic's restriction of activities and socialization.    Follow up on diabetes.  Patient has no complaints today.  Patient is following a low glycemic index diet and taking all prescribed medications regularly without side effects.  Fasting sugars have been  less than 130 most of the time and  post prandials have been under 160 except on rare occasions. Patient is not  Exercising due to right sided weakness and is not overweight  .  Patient has had an eye exam in the last 12 months and checks feet regularly for signs of infection.  Patient uses a walker and does not walk barefoot outside,  And denies any numbness tingling or burning in feet. Patient is up to date on all recommended vaccinations  Post CVA:  He continues to require use of walker and wheelchair for ambulation due to right leg weakness.  Right UE remains week as well requiring minimal assistance with showering, dressing and grooming .  Mood still labile due to frontal lobe involvement, but no agitation or memory issue/   ROS: See pertinent positives and negatives per HPI.  Past Medical History:  Diagnosis Date  . Arthritis    "mild in my hands" (06/04/2017)  . Complication of anesthesia    "was told never to use Propofol because of parent's adverse reaction" (06/04/2017)  . CVA (cerebral vascular accident) ([place])    hospitalized from 12/22-12/24 due to left-sided weakness, slurred speech and difficulty swallowing. He was diagnosed as left pontine stroke./notes 06/03/2017  . Family history of adverse reaction to anesthesia    "mother and dad had allergic reaction Propofol" (06/04/2017)  . Glaucoma, both eyes   . Gout   . Hypertension   . Parotid mass   . Stroke (cerebrum) ([place]) 06/03/2017  . Type 2 diabetes mellitus with hyperglycemia [facility])     Past Surgical History:  Procedure Laterality Date  . TONSILLECTOMY  1943  Family History  Problem Relation Age of Onset  . Arthritis Mother   . Stroke Father   . Hypertension Father   . Heart disease Father 40       AMI  . Kidney disease Father        bladder ca congenital loss of kidney  . Arthritis Maternal Grandmother   . Early death Daughter 16       aspiration  . Cancer Brother        Scalp    SOCIAL HX:  reports that he has never smoked. He has never  used smokeless tobacco. He reports current alcohol use. He reports that he does not use drugs.   Current Outpatient Medications:  .  acetaminophen (TYLENOL) 500 MG tablet, Take 500 mg by mouth every 6 (six) hours as needed., Disp: , Rfl:  .  ALPRAZolam (XANAX) 0.5 MG tablet, TAKE 1 TABLET(0.5 MG) BY MOUTH AT BEDTIME AS NEEDED FOR ANXIETY, Disp: 30 tablet, Rfl: 5 .  Cholecalciferol (VITAMIN D-3) 1000 units CAPS, Take 1,000 Units by mouth daily., Disp: , Rfl:  .  clopidogrel (PLAVIX) 75 MG tablet, TAKE 1 TABLET(75 MG) BY MOUTH DAILY, Disp: 90 tablet, Rfl: 1 .  empagliflozin (JARDIANCE) 10 MG TABS tablet, Take 10 mg by mouth daily., Disp: 90 tablet, Rfl: 1 .  fluticasone (FLONASE) 50 MCG/ACT nasal spray, Place 2 sprays into both nostrils daily as needed for allergies or rhinitis. , Disp: , Rfl:  .  gabapentin (NEURONTIN) 100 MG capsule, , Disp: , Rfl:  .  HYDROCORTISONE, TOPICAL, 2 % LOTN, Apply 1 application topically as needed (itching)., Disp: , Rfl:  .  latanoprost (XALATAN) 0.005 % ophthalmic solution, Instill 1 drop into both eyes in the evening, Disp: , Rfl: 0 .  Menthol, Topical Analgesic, (BLUE-EMU MAXIMUM STRENGTH EX), Apply topically., Disp: , Rfl:  .  mirtazapine (REMERON) 7.5 MG tablet, TAKE 1 TABLET(7.5 MG) BY MOUTH AT BEDTIME, Disp: 90 tablet, Rfl: 1 .  NYSTATIN powder, APPLY TOPICALLY TWICE A DAY AS NEEDED FOR RASH, Disp: 60 g, Rfl: 2 .  rosuvastatin (CRESTOR) 10 MG tablet, Take 1 tablet (10 mg total) by mouth daily., Disp: 90 tablet, Rfl: 1 .  timolol (TIMOPTIC) 0.5 % ophthalmic solution, Instill 1 drop into the right eye in the morning, Disp: , Rfl: 0  EXAM:  VITALS per patient if applicable:  GENERAL: alert, oriented, appears well and in no acute distress  HEENT: atraumatic, conjunttiva clear, no obvious abnormalities on inspection of external nose and ears  NECK: normal movements of the head and neck  LUNGS: on inspection no signs of respiratory distress, breathing rate  appears normal, no obvious gross SOB, gasping or wheezing  CV: no obvious cyanosis  MS: moves all visible extremities without noticeable abnormality  PSYCH/NEURO: pleasant and cooperative, no obvious depression or anxiety, speech and thought processing grossly intact  ASSESSMENT AND PLAN:  Discussed the following assessment and plan:  Essential hypertension, benign - Plan: Comprehensive metabolic panel, Microalbumin / creatinine urine ratio  Type 2 diabetes mellitus with diabetic chronic kidney disease, unspecified CKD stage, unspecified whether long term insulin use (HCC) - Plan: Lipid panel, Hemoglobin A1c  Polycythemia, secondary - Plan: CBC with Differential/Platelet  Late effects of cerebral ischemic stroke  Essential hypertension, benign I am suspending his losartan for BP 90/59 and have asked Lynda to recheck his BP in 3 days.   Type II diabetes mellitus with renal manifestations (HCC) Historically well-controlled on Jardiance.  hemoglobin A1c is overdue .  ptient is up-to-date on eye exams and foot exam is deferred due to COVID 19 restrictions and patient's decreased mobility. .  Patient is due for urine microalbumin to creatinine ratio . Patient is tolerating statin therapy for CAD risk reduction and ACE/ARB has been suspended due to hypotension,  He has no history of  proteinuria.  Current labs are ordered   Lab Results  Component Value Date   HGBA1C 5.9 09/17/2018   Lab Results  Component Value Date   MICROALBUR 0.7 09/25/2018   No results found for: LIPASE Lab Results  Component Value Date   CHOL 92 09/17/2018   HDL 33.50 (L) 09/17/2018   LDLCALC 27 09/17/2018   LDLDIRECT 117.0 05/06/2017   TRIG 158.0 (H) 09/17/2018   CHOLHDL 3 09/17/2018    Late effects of cerebral ischemic stroke He continues to require assistance with ADLs due to right sided hemiparesis.  He uses a walker in the house and a wheelchair outside of the home for distances > 20 feet.     I  discussed the assessment and treatment plan with the patient. The patient was provided an opportunity to ask questions and all were answered. The patient agreed with the plan and demonstrated an understanding of the instructions.   The patient was advised to call back or seek an in-person evaluation if the symptoms worsen or if the condition fails to improve as anticipated.  I provided  30 minutes of non-face-to-face time during this encounter reviewing patient's current problems and past surgeries, labs and imaging studies, providing counseling on the above mentioned problems , and coordination  of care .  Crecencio Mc, MD

## 2019-08-27 NOTE — Assessment & Plan Note (Signed)
He continues to require assistance with ADLs due to right sided hemiparesis.  He uses a walker in the house and a wheelchair outside of the home for distances > 20 feet.

## 2019-08-27 NOTE — Assessment & Plan Note (Signed)
Historically well-controlled on Jardiance.  hemoglobin A1c is overdue .  ptient is up-to-date on eye exams and foot exam is deferred due to COVID 19 restrictions and patient's decreased mobility. .  Patient is due for urine microalbumin to creatinine ratio . Patient is tolerating statin therapy for CAD risk reduction and ACE/ARB has been suspended due to hypotension,  He has no history of  proteinuria.  Current labs are ordered   Lab Results  Component Value Date   HGBA1C 5.9 09/17/2018   Lab Results  Component Value Date   MICROALBUR 0.7 09/25/2018   No results found for: LIPASE Lab Results  Component Value Date   CHOL 92 09/17/2018   HDL 33.50 (L) 09/17/2018   LDLCALC 27 09/17/2018   LDLDIRECT 117.0 05/06/2017   TRIG 158.0 (H) 09/17/2018   CHOLHDL 3 09/17/2018

## 2019-08-30 ENCOUNTER — Telehealth: Payer: Self-pay | Admitting: Internal Medicine

## 2019-08-30 MED ORDER — MIRTAZAPINE 7.5 MG PO TABS: ORAL_TABLET | ORAL | 1 refills | Status: DC

## 2019-08-30 NOTE — Telephone Encounter (Signed)
Pt needs refill on mirtazapine (REMERON) 7.5 MG tablet asap

## 2019-08-30 NOTE — Telephone Encounter (Signed)
Medication has been refilled.

## 2019-08-31 DIAGNOSIS — H40003 Preglaucoma, unspecified, bilateral: Secondary | ICD-10-CM | POA: Diagnosis not present

## 2019-09-01 ENCOUNTER — Telehealth: Payer: Self-pay | Admitting: Internal Medicine

## 2019-09-01 NOTE — Telephone Encounter (Signed)
Those are normal.  He should stay off the blood pressure medication  Check BP once a week.  Do not resume medication unless reading is 140/80 or higher

## 2019-09-01 NOTE — Telephone Encounter (Signed)
Pt's wife called to give bp results.  3/19 105/72 p-84  3/23 117/77 p-88

## 2019-09-02 NOTE — Telephone Encounter (Signed)
Spoke with pt's wife to let her know that the readings are normal and that Dr. Derrel Nip does not want the pt to go back on the bp medication unless his readings are 140/80 or higher. Pt's wife gave a verbal understanding.

## 2019-09-14 ENCOUNTER — Ambulatory Visit (INDEPENDENT_AMBULATORY_CARE_PROVIDER_SITE_OTHER): Payer: Medicare PPO

## 2019-09-14 ENCOUNTER — Encounter (INDEPENDENT_AMBULATORY_CARE_PROVIDER_SITE_OTHER): Payer: Self-pay

## 2019-09-14 ENCOUNTER — Other Ambulatory Visit: Payer: Self-pay

## 2019-09-14 VITALS — BP 102/63 | HR 70 | Ht 69.0 in | Wt 145.0 lb

## 2019-09-14 DIAGNOSIS — Z0000 Encounter for general adult medical examination without abnormal findings: Secondary | ICD-10-CM

## 2019-09-14 NOTE — Patient Instructions (Addendum)
  Mr. Evetts , Thank you for taking time to come for your Medicare Wellness Visit. I appreciate your ongoing commitment to your health goals. Please review the following plan we discussed and let me know if I can assist you in the future.   These are the goals we discussed: Goals    . Follow up with Primary Care Provider     As needed       This is a list of the screening recommended for you and due dates:  Health Maintenance  Topic Date Due  . Complete foot exam   Never done  . Hemoglobin A1C  03/19/2019  . Urine Protein Check  09/25/2019  . Eye exam for diabetics  12/24/2019  . Flu Shot  01/09/2020  . Tetanus Vaccine  06/12/2023  . Pneumonia vaccines  Completed

## 2019-09-14 NOTE — Progress Notes (Addendum)
Subjective:   Anthony Allen is a 84 y.o. male who presents for Medicare Annual/Subsequent preventive examination.  Review of Systems:  No ROS.  Medicare Wellness Virtual Visit.  Visual/audio telehealth visit.  Vitals provided by patient. See social history for additional risk factors.  Last 3 home BP readings:(09/06/19) 124/69 P75, (09/08/19) 117/77 P64, (09/13/19) 113/66 P78.  Cardiac Risk Factors include: advanced age (>48men, >57 women);hypertension;diabetes mellitus;male gender     Objective:    Vitals: BP 102/63   Pulse 70   Ht 5\' 9"  (1.753 m)   Wt 145 lb (65.8 kg)   BMI 21.41 kg/m   Body mass index is 21.41 kg/m.  Advanced Directives 09/14/2019 11/30/2018 09/18/2018 11/13/2017 11/10/2017 10/24/2017 10/24/2017  Does Patient Have a Medical Advance Directive? Yes No Yes Yes Yes Yes Yes  Type of Paramedic of [place];Living will Living will - [place];Living will [place];Living will [place];Living will [place];Living will  Does patient want to make changes to medical advance directive? No - Patient declined No - Patient declined - - - - -  Copy of [place] in Chart? No - copy requested No - copy requested - No - copy requested No - copy requested No - copy requested No - copy requested  Would patient like information on creating a medical advance directive? - No - Patient declined - - - - -    Tobacco Social History   Tobacco Use  Smoking Status Never Smoker  Smokeless Tobacco Never Used     Counseling given: Not Answered   Clinical Intake:  Pre-visit preparation completed: Yes        Diabetes: Yes(Followed by pcp)  How often do you need to have someone help you when you read instructions, pamphlets, or other written materials from your doctor or pharmacy?: 1 - Never  Interpreter Needed?: No     Past Medical History:  Diagnosis Date    . Arthritis    "mild in my hands" (06/04/2017)  . Complication of anesthesia    "was told never to use Propofol because of parent's adverse reaction" (06/04/2017)  . CVA (cerebral vascular accident) ([place])    hospitalized from 12/22-12/24 due to left-sided weakness, slurred speech and difficulty swallowing. He was diagnosed as left pontine stroke./notes 06/03/2017  . Family history of adverse reaction to anesthesia    "mother and dad had allergic reaction Propofol" (06/04/2017)  . Glaucoma, both eyes   . Gout   . Hypertension   . Parotid mass   . Stroke (cerebrum) ([place]) 06/03/2017  . Type 2 diabetes mellitus with hyperglycemia [facility])    Past Surgical History:  Procedure Laterality Date  . TONSILLECTOMY  1943   Family History  Problem Relation Age of Onset  . Arthritis Mother   . Stroke Father   . Hypertension Father   . Heart disease Father 11       AMI  . Kidney disease Father        bladder ca congenital loss of kidney  . Arthritis Maternal Grandmother   . Early death Daughter 16       aspiration  . Cancer Brother        Scalp  . Macular degeneration Brother    Social History   Socioeconomic History  . Marital status: Married    Spouse name: Not on file  . Number of children: Not on file  . Years of  education: Not on file  . Highest education level: Not on file  Occupational History  . Not on file  Tobacco Use  . Smoking status: Never Smoker  . Smokeless tobacco: Never Used  Substance and Sexual Activity  . Alcohol use: Yes    Comment: 1 glass of wine per month  . Drug use: No  . Sexual activity: Not Currently  Other Topics Concern  . Not on file  Social History Narrative  . Not on file   Social Determinants of Health   Financial Resource Strain: Low Risk   . Difficulty of Paying Living Expenses: Not hard at all  Food Insecurity: No Food Insecurity  . Worried About Charity fundraiser in the Last Year: Never true  . Ran Out of Food in the Last Year:  Never true  Transportation Needs: No Transportation Needs  . Lack of Transportation (Medical): No  . Lack of Transportation (Non-Medical): No  Physical Activity:   . Days of Exercise per Week:   . Minutes of Exercise per Session:   Stress: No Stress Concern Present  . Feeling of Stress : Only a little  Social Connections: Unknown  . Frequency of Communication with Friends and Family: More than three times a week  . Frequency of Social Gatherings with Friends and Family: Once a week  . Attends Religious Services: Not on file  . Active Member of Clubs or Organizations: Not on file  . Attends Archivist Meetings: Not on file  . Marital Status: Married    Outpatient Encounter Medications as of 09/14/2019  Medication Sig  . acetaminophen (TYLENOL) 500 MG tablet Take 500 mg by mouth every 6 (six) hours as needed.  . ALPRAZolam (XANAX) 0.5 MG tablet TAKE 1 TABLET(0.5 MG) BY MOUTH AT BEDTIME AS NEEDED FOR ANXIETY  . Cholecalciferol (VITAMIN D-3) 1000 units CAPS Take 1,000 Units by mouth daily.  . clopidogrel (PLAVIX) 75 MG tablet TAKE 1 TABLET(75 MG) BY MOUTH DAILY  . empagliflozin (JARDIANCE) 10 MG TABS tablet Take 10 mg by mouth daily.  . fluticasone (FLONASE) 50 MCG/ACT nasal spray Place 2 sprays into both nostrils daily as needed for allergies or rhinitis.   Marland Kitchen gabapentin (NEURONTIN) 100 MG capsule   . HYDROCORTISONE, TOPICAL, 2 % LOTN Apply 1 application topically as needed (itching).  Marland Kitchen latanoprost (XALATAN) 0.005 % ophthalmic solution Instill 1 drop into both eyes in the evening  . Menthol, Topical Analgesic, (BLUE-EMU MAXIMUM STRENGTH EX) Apply topically.  . mirtazapine (REMERON) 7.5 MG tablet TAKE 1 TABLET(7.5 MG) BY MOUTH AT BEDTIME  . NYSTATIN powder APPLY TOPICALLY TWICE A DAY AS NEEDED FOR RASH  . rosuvastatin (CRESTOR) 10 MG tablet Take 1 tablet (10 mg total) by mouth daily.  . timolol (TIMOPTIC) 0.5 % ophthalmic solution Instill 1 drop into the right eye in the morning     No facility-administered encounter medications on file as of 09/14/2019.    Activities of Daily Living In your present state of health, do you have any difficulty performing the following activities: 09/14/2019  Hearing? N  Vision? N  Difficulty concentrating or making decisions? Y  Walking or climbing stairs? Y  Comment Unsteady gait; walker in use  Dressing or bathing? Y  Comment Wife assist  Doing errands, shopping? Y  Comment He does not Physiological scientist and eating ? Y  Comment Wife assists with meal prep  Using the Toilet? N  In the past six months, have you accidently leaked urine?  N  Do you have problems with loss of bowel control? N  Managing your Medications? Y  Comment Wife manages  Managing your Finances? Y  Comment Wife assist  Housekeeping or managing your Housekeeping? Y  Comment Wife assist  Some recent data might be hidden    Patient Care Team: Crecencio Mc, MD as PCP - General (Internal Medicine)   Assessment:   This is a routine wellness examination for Anthony Allen. Nurse connected with patient 09/14/19 at  2:00 PM EDT by a telephone enabled telemedicine application and verified that I am speaking with the correct person using two identifiers. Patient stated full name and DOB. Patient gave permission to continue with virtual visit. Patient's location was at home and Nurse's location was at [place] office.   Patient is alert and oriented x3. Patient likes to read, use his ipad and stays socially connected for brain health.   Health Maintenance Due: -Urine Microalbumin- scheduled 09/24/19 -Foot Exam- denies changes, wounds. Followed by pcp. -Hgb A1c- 09/17/18 (5.9); scheduled 09/24/19 See completed HM at the end of note.   Eye: Visual acuity not assessed. Virtual visit. Followed by their ophthalmologist.  Retinopathy- none reported. Glaucoma; drops in use. Visits every 3-6 months.   Dental: Visits every 3 months.    Hearing: Demonstrates normal  hearing during visit.  Safety:  Patient feels safe at home- yes Patient does have smoke detectors at home- yes Patient does wear sunscreen or protective clothing when in direct sunlight - yes Patient does wear seat belt when in a moving vehicle - yes Patient drives- no Adequate lighting in walkways free from debris- yes Grab bars and handrails used as appropriate- yes Ambulates with an assistive device- yes; walker indoors, wheelchair outdoors. Cell phone on person when ambulating outside of the home- yes  Social: Alcohol intake -  yes      Smoking history- never  Smokers in home? none Illicit drug use? none  Medication: Taking as directed and without issues.  Pill box in use -no Self managed - no; wife manages  Covid-19: Precautions and sickness symptoms discussed. Wears mask, social distancing, hand hygiene as appropriate.   Activities of Daily Living Assisted by wife with ADLs.   Discussed the importance of a healthy diet, water intake and the benefits of aerobic exercise.   Physical activity- daily care for himself. He is considering another session of PT in the future.   Diet:  3 Atkins protein drinks daily. Modified diet for diabetes.  Water: fair intake. Encouraged to stay hydrated and drink more water.  Caffeine: diet coke  Other Providers Patient Care Team: Crecencio Mc, MD as PCP - General (Internal Medicine)  Exercise Activities and Dietary recommendations    Goals    . Follow up with Primary Care Provider     As needed       Fall Risk Fall Risk  09/14/2019 08/25/2019 04/30/2018 08/05/2017 09/05/2016  Falls in the past year? 0 0 0 No No  Follow up Falls evaluation completed;Falls prevention discussed Falls evaluation completed - - -   Timed Get Up and Go Performed: no, virtual visit  Depression Screen PHQ 2/9 Scores 09/14/2019 08/25/2019 10/31/2017 09/05/2016  PHQ - 2 Score 0 0 1 0  PHQ- 9 Score - - 8 -    Cognitive Function MMSE - Mini Mental  State Exam 09/14/2019 09/05/2016 09/06/2015  Not completed: Unable to complete - -  Orientation to time - 5 5  Orientation to Place - 5  5  Registration - 3 3  Attention/ Calculation - 5 5  Recall - 3 3  Language- name 2 objects - 2 2  Language- repeat - 1 1  Language- follow 3 step command - 3 3  Language- read & follow direction - 1 1  Write a sentence - 1 1  Copy design - 1 1  Total score - 30 30        Immunization History  Administered Date(s) Administered  . Fluad Quad(high Dose 65+) 03/03/2019  . Influenza Split 06/15/2013, 02/08/2014  . Influenza, High Dose Seasonal PF 02/21/2017, 03/02/2018  . Influenza-Unspecified 02/09/2015, 03/10/2016  . PFIZER SARS-COV-2 Vaccination 07/09/2019, 07/30/2019  . Pneumococcal Conjugate-13 06/23/2013  . Pneumococcal Polysaccharide-23 05/02/2015  . Tdap 06/11/2013  . Zoster 07/01/2013  . Zoster Recombinat (Shingrix) 02/21/2017, 05/03/2017   Screening Tests Health Maintenance  Topic Date Due  . FOOT EXAM  Never done  . HEMOGLOBIN A1C  03/19/2019  . URINE MICROALBUMIN  09/25/2019  . OPHTHALMOLOGY EXAM  12/24/2019  . INFLUENZA VACCINE  01/09/2020  . TETANUS/TDAP  06/12/2023  . PNA vac Low Risk Adult  Completed      Plan:   Keep all routine maintenance appointments.   Next scheduled fasting lab 09/24/19 @ 11:30.  Medicare Attestation I have personally reviewed: The patient's medical and social history Their use of alcohol, tobacco or illicit drugs Their current medications and supplements The patient's functional ability including ADLs,fall risks, home safety risks, cognitive, and hearing and visual impairment Diet and physical activities Evidence for depression   I have reviewed and discussed with patient certain preventive protocols, quality metrics, and best practice recommendations.      OBrien-Blaney, Jakeia Carreras L, LPN  [id number]   I have reviewed the above information and agree with above.   Deborra Medina, MD

## 2019-09-24 ENCOUNTER — Other Ambulatory Visit: Payer: Self-pay

## 2019-09-24 ENCOUNTER — Other Ambulatory Visit (INDEPENDENT_AMBULATORY_CARE_PROVIDER_SITE_OTHER): Payer: Medicare PPO

## 2019-09-24 DIAGNOSIS — I10 Essential (primary) hypertension: Secondary | ICD-10-CM | POA: Diagnosis not present

## 2019-09-24 DIAGNOSIS — D751 Secondary polycythemia: Secondary | ICD-10-CM

## 2019-09-24 DIAGNOSIS — E1122 Type 2 diabetes mellitus with diabetic chronic kidney disease: Secondary | ICD-10-CM

## 2019-09-24 LAB — CBC WITH DIFFERENTIAL/PLATELET
Basophils Absolute: 0.1 10*3/uL (ref 0.0–0.1)
Basophils Relative: 1.2 % (ref 0.0–3.0)
Eosinophils Absolute: 0.2 10*3/uL (ref 0.0–0.7)
Eosinophils Relative: 2.9 % (ref 0.0–5.0)
HCT: 50.8 % (ref 39.0–52.0)
Hemoglobin: 17.7 g/dL — ABNORMAL HIGH (ref 13.0–17.0)
Lymphocytes Relative: 18.3 % (ref 12.0–46.0)
Lymphs Abs: 1.1 10*3/uL (ref 0.7–4.0)
MCHC: 34.7 g/dL (ref 30.0–36.0)
MCV: 94.4 fl (ref 78.0–100.0)
Monocytes Absolute: 0.4 10*3/uL (ref 0.1–1.0)
Monocytes Relative: 6.3 % (ref 3.0–12.0)
Neutro Abs: 4.4 10*3/uL (ref 1.4–7.7)
Neutrophils Relative %: 71.3 % (ref 43.0–77.0)
Platelets: 152 10*3/uL (ref 150.0–400.0)
RBC: 5.38 Mil/uL (ref 4.22–5.81)
RDW: 13.6 % (ref 11.5–15.5)
WBC: 6.2 10*3/uL (ref 4.0–10.5)

## 2019-09-24 LAB — COMPREHENSIVE METABOLIC PANEL
ALT: 25 U/L (ref 0–53)
AST: 22 U/L (ref 0–37)
Albumin: 4.1 g/dL (ref 3.5–5.2)
Alkaline Phosphatase: 82 U/L (ref 39–117)
BUN: 24 mg/dL — ABNORMAL HIGH (ref 6–23)
CO2: 27 mEq/L (ref 19–32)
Calcium: 9.6 mg/dL (ref 8.4–10.5)
Chloride: 102 mEq/L (ref 96–112)
Creatinine, Ser: 1.54 mg/dL — ABNORMAL HIGH (ref 0.40–1.50)
GFR: 43.51 mL/min — ABNORMAL LOW (ref >60.00)
Glucose, Bld: 141 mg/dL — ABNORMAL HIGH (ref 70–99)
Potassium: 4.1 mEq/L (ref 3.5–5.1)
Sodium: 138 mEq/L (ref 135–145)
Total Bilirubin: 1 mg/dL (ref 0.2–1.2)
Total Protein: 6.5 g/dL (ref 6.0–8.3)

## 2019-09-24 LAB — LIPID PANEL
Cholesterol: 96 mg/dL (ref 0–200)
HDL: 36.5 mg/dL — ABNORMAL LOW (ref >39.00)
LDL Cholesterol: 33 mg/dL (ref 0–99)
NonHDL: 59.68
Total CHOL/HDL Ratio: 3
Triglycerides: 131 mg/dL (ref 0.0–149.0)
VLDL: 26.2 mg/dL (ref 0.0–40.0)

## 2019-09-24 LAB — HEMOGLOBIN A1C: Hgb A1c MFr Bld: 6.1 % (ref 4.6–6.5)

## 2019-09-24 NOTE — Addendum Note (Signed)
Addended by: Tor Netters I on: 09/24/2019 12:30 PM   Modules accepted: Orders

## 2019-09-27 ENCOUNTER — Other Ambulatory Visit: Payer: Self-pay | Admitting: Dermatology

## 2019-09-27 LAB — MICROALBUMIN / CREATININE URINE RATIO
Creatinine,U: 66.1 mg/dL
Microalb Creat Ratio: 3.4 mg/g (ref 0.0–30.0)
Microalb, Ur: 2.2 mg/dL — ABNORMAL HIGH (ref 0.0–1.9)

## 2019-09-27 NOTE — Addendum Note (Signed)
Addended byElpidio Galea T on: 09/27/2019 09:43 AM   Modules accepted: Orders

## 2019-09-30 ENCOUNTER — Telehealth: Payer: Self-pay | Admitting: Internal Medicine

## 2019-09-30 NOTE — Telephone Encounter (Signed)
Pt's wife returned your call about lab work

## 2019-10-01 NOTE — Telephone Encounter (Signed)
Pt's wife called about husbands results.

## 2019-10-01 NOTE — Telephone Encounter (Signed)
See result note message 

## 2019-10-11 ENCOUNTER — Telehealth: Payer: Self-pay | Admitting: Internal Medicine

## 2019-10-11 MED ORDER — ALPRAZOLAM 0.5 MG PO TABS: ORAL_TABLET | ORAL | 5 refills | Status: DC

## 2019-10-11 NOTE — Telephone Encounter (Signed)
Refill request for xanax, last seen 08-25-19, last filled 04-18-19.  Please advise.

## 2019-10-11 NOTE — Telephone Encounter (Signed)
Refill authorized and sent

## 2019-10-11 NOTE — Telephone Encounter (Signed)
Pt needs alprazolam sent to [facility]. They said they need a new prescription for this medication is what they were told.

## 2019-10-11 NOTE — Addendum Note (Signed)
Addended by: Crecencio Mc on: 10/11/2019 01:41 PM   Modules accepted: Orders

## 2019-11-17 ENCOUNTER — Other Ambulatory Visit: Payer: Self-pay

## 2019-11-17 MED ORDER — CLOPIDOGREL BISULFATE 75 MG PO TABS: ORAL_TABLET | ORAL | 1 refills | Status: DC

## 2019-11-18 ENCOUNTER — Other Ambulatory Visit: Payer: Self-pay

## 2019-11-18 MED ORDER — EMPAGLIFLOZIN 10 MG PO TABS: 10.0000 mg | ORAL_TABLET | Freq: Every day | ORAL | 1 refills | Status: DC

## 2019-11-29 ENCOUNTER — Other Ambulatory Visit: Payer: Self-pay | Admitting: Dermatology

## 2019-12-28 ENCOUNTER — Other Ambulatory Visit: Payer: Self-pay | Admitting: Dermatology

## 2020-01-31 ENCOUNTER — Telehealth: Payer: Self-pay | Admitting: Internal Medicine

## 2020-01-31 NOTE — Telephone Encounter (Signed)
Pt's wife called and states that pt's left great toe looks infected. No providers had anything until the end of this week. She scheduled him for 8am VV with Dr Derrel Nip. Pt's wife states that they are going to call urgent care across the street from our office. Still wants PCP to be aware.

## 2020-01-31 NOTE — Telephone Encounter (Signed)
FYI

## 2020-02-04 ENCOUNTER — Telehealth (INDEPENDENT_AMBULATORY_CARE_PROVIDER_SITE_OTHER): Payer: Medicare PPO | Admitting: Internal Medicine

## 2020-02-04 ENCOUNTER — Encounter: Payer: Self-pay | Admitting: Internal Medicine

## 2020-02-04 DIAGNOSIS — L03031 Cellulitis of right toe: Secondary | ICD-10-CM

## 2020-02-04 MED ORDER — CEPHALEXIN 500 MG PO CAPS: 500.0000 mg | ORAL_CAPSULE | Freq: Four times a day (QID) | ORAL | 0 refills | Status: DC

## 2020-02-04 NOTE — Progress Notes (Signed)
Virtual Visit via [place]  This visit type was conducted due to national recommendations for restrictions regarding the COVID-19 pandemic (e.g. social distancing).  This format is felt to be most appropriate for this patient at this time.  All issues noted in this document were discussed and addressed.  No physical exam was performed (except for noted visual exam findings with Video Visits).   I connected with@ on 02/04/20 at  8:00 AM EDT by a video enabled telemedicine application  and verified that I am speaking with the correct person using two identifiers. Location patient: home Location provider: work or home office Persons participating in the virtual visit: patient, provider  I discussed the limitations, risks, security and privacy concerns of performing an evaluation and management service by telephone and the availability of in person appointments. I also discussed with the patient that there may be a patient responsible charge related to this service. The patient expressed understanding and agreed to proceed.   Reason for visit: painful swollen toe   HPI:  84 yr old male with type 2 DM, controlled,  CVD with h.o CVA, presents with 10 day history of swollen painful great toe which started 24 hours after wife trimmed toenail.  swelling limited to cuticle,  Red and tender.  Using triple abx ointment with no change.  No fevers,  No redness spreading to proximal toe.    ROS: See pertinent positives and negatives per HPI.  Past Medical History:  Diagnosis Date  . Arthritis    "mild in my hands" (06/04/2017)  . Complication of anesthesia    "was told never to use Propofol because of parent's adverse reaction" (06/04/2017)  . CVA (cerebral vascular accident) ([place])    hospitalized from 12/22-12/24 due to left-sided weakness, slurred speech and difficulty swallowing. He was diagnosed as left pontine stroke./notes 06/03/2017  . Family history of adverse reaction to anesthesia     "mother and dad had allergic reaction Propofol" (06/04/2017)  . Glaucoma, both eyes   . Gout   . Hypertension   . Parotid mass   . Stroke (cerebrum) ([place]) 06/03/2017  . Type 2 diabetes mellitus with hyperglycemia [facility])     Past Surgical History:  Procedure Laterality Date  . TONSILLECTOMY  1943    Family History  Problem Relation Age of Onset  . Arthritis Mother   . Stroke Father   . Hypertension Father   . Heart disease Father 21       AMI  . Kidney disease Father        bladder ca congenital loss of kidney  . Arthritis Maternal Grandmother   . Early death Daughter 16       aspiration  . Cancer Brother        Scalp  . Macular degeneration Brother     SOCIAL HX: no longer IADL due to CVA.   reports that he has never smoked. He has never used smokeless tobacco. He reports current alcohol use. He reports that he does not use drugs.   Current Outpatient Medications:  .  acetaminophen (TYLENOL) 500 MG tablet, Take 500 mg by mouth every 6 (six) hours as needed., Disp: , Rfl:  .  ALPRAZolam (XANAX) 0.5 MG tablet, TAKE 1 TABLET(0.5 MG) BY MOUTH AT BEDTIME AS NEEDED FOR ANXIETY, Disp: 30 tablet, Rfl: 5 .  Cholecalciferol (VITAMIN D-3) 1000 units CAPS, Take 1,000 Units by mouth daily., Disp: , Rfl:  .  clindamycin (CLEOCIN T) 1 % lotion, APPLY EXTERNALLY TO THE  AFFECTED AREA EVERY DAY TO TWICE DAILY AS DIRECTED, Disp: 60 mL, Rfl: 6 .  clopidogrel (PLAVIX) 75 MG tablet, TAKE 1 TABLET(75 MG) BY MOUTH DAILY, Disp: 90 tablet, Rfl: 1 .  empagliflozin (JARDIANCE) 10 MG TABS tablet, Take 1 tablet (10 mg total) by mouth daily., Disp: 90 tablet, Rfl: 1 .  fluticasone (FLONASE) 50 MCG/ACT nasal spray, Place 2 sprays into both nostrils daily as needed for allergies or rhinitis. , Disp: , Rfl:  .  gabapentin (NEURONTIN) 100 MG capsule, , Disp: , Rfl:  .  HYDROCORTISONE, TOPICAL, 2 % LOTN, Apply 1 application topically as needed (itching)., Disp: , Rfl:  .  ketoconazole (NIZORAL) 2 % cream,  APPLY EXTERNALLY TO THE AFFECTED AREA AS DIRECTED TO FACE MONDAY, WEDNESDAY, AND FRIDAY. APPLY TO GROIN AREA EVERY DAY, Disp: 60 g, Rfl: 3 .  latanoprost (XALATAN) 0.005 % ophthalmic solution, Instill 1 drop into both eyes in the evening, Disp: , Rfl: 0 .  Menthol, Topical Analgesic, (BLUE-EMU MAXIMUM STRENGTH EX), Apply topically., Disp: , Rfl:  .  mirtazapine (REMERON) 7.5 MG tablet, TAKE 1 TABLET(7.5 MG) BY MOUTH AT BEDTIME, Disp: 90 tablet, Rfl: 1 .  mometasone (ELOCON) 0.1 % lotion, APPLY SMALL AMOUNT EXTERNALLY TO THE SCALP EVERY OTHER NIGHT AS DIRECTED, Disp: 60 mL, Rfl: 1 .  NYSTATIN powder, APPLY TOPICALLY TWICE A DAY AS NEEDED FOR RASH, Disp: 60 g, Rfl: 2 .  rosuvastatin (CRESTOR) 10 MG tablet, Take 1 tablet (10 mg total) by mouth daily., Disp: 90 tablet, Rfl: 1 .  timolol (TIMOPTIC) 0.5 % ophthalmic solution, Instill 1 drop into the right eye in the morning, Disp: , Rfl: 0 .  cephALEXin (KEFLEX) 500 MG capsule, Take 1 capsule (500 mg total) by mouth 4 (four) times daily., Disp: 28 capsule, Rfl: 0  EXAM:  VITALS per patient if applicable:  GENERAL: alert, oriented, appears well and in no acute distress  HEENT: atraumatic, conjunttiva clear, no obvious abnormalities on inspection of external nose and ears  NECK: normal movements of the head and neck  LUNGS: on inspection no signs of respiratory distress, breathing rate appears normal, no obvious gross SOB, gasping or wheezing  CV: no obvious cyanosis  MS: moves all visible extremities without noticeable abnormality  Ext:  Right great toe with lateral cuticle red and swollen   PSYCH/NEURO: pleasant and cooperative, no obvious depression or anxiety, speech and thought processing grossly intact  ASSESSMENT AND PLAN:  Discussed the following assessment and plan:  Paronychia of great toe, right  Paronychia of great toe, right Occurred after wife trimmed toenail.  Present for ten days and not responding to topical triple  antibotic ointment.  rx cephalexin 500 mg qid x 7 days,  Epsom salt soaks for ten mintues 3 tims daily.  Probiotics daily for 3 weeks    I discussed the assessment and treatment plan with the patient. The patient was provided an opportunity to ask questions and all were answered. The patient agreed with the plan and demonstrated an understanding of the instructions.   The patient was advised to call back or seek an in-person evaluation if the symptoms worsen or if the condition fails to improve as anticipated.  I provided 20 minutes of non-face-to-face time during this encounter.   Crecencio Mc, MD

## 2020-02-04 NOTE — Assessment & Plan Note (Signed)
Occurred after wife trimmed toenail.  Present for ten days and not responding to topical triple antibotic ointment.  rx cephalexin 500 mg qid x 7 days,  Epsom salt soaks for ten mintues 3 tims daily.  Probiotics daily for 3 weeks

## 2020-02-17 ENCOUNTER — Telehealth: Payer: Self-pay | Admitting: Internal Medicine

## 2020-02-17 MED ORDER — ROSUVASTATIN CALCIUM 10 MG PO TABS: 10.0000 mg | ORAL_TABLET | Freq: Every day | ORAL | 1 refills | Status: DC

## 2020-02-17 NOTE — Telephone Encounter (Signed)
Pt needs a refill on rosuvastatin (CRESTOR) 10 MG tablet sent to [facility]

## 2020-02-29 DIAGNOSIS — E119 Type 2 diabetes mellitus without complications: Secondary | ICD-10-CM | POA: Diagnosis not present

## 2020-02-29 DIAGNOSIS — H353131 Nonexudative age-related macular degeneration, bilateral, early dry stage: Secondary | ICD-10-CM | POA: Diagnosis not present

## 2020-02-29 LAB — HM DIABETES EYE EXAM

## 2020-03-02 ENCOUNTER — Other Ambulatory Visit: Payer: Self-pay | Admitting: Internal Medicine

## 2020-03-13 ENCOUNTER — Ambulatory Visit (INDEPENDENT_AMBULATORY_CARE_PROVIDER_SITE_OTHER): Payer: Medicare PPO

## 2020-03-13 ENCOUNTER — Other Ambulatory Visit: Payer: Self-pay

## 2020-03-13 DIAGNOSIS — Z23 Encounter for immunization: Secondary | ICD-10-CM | POA: Diagnosis not present

## 2020-04-06 ENCOUNTER — Telehealth: Payer: Self-pay

## 2020-04-06 NOTE — Telephone Encounter (Signed)
Pt's wife called and asked for application for handicap placards. Please call when complete

## 2020-04-06 NOTE — Telephone Encounter (Signed)
Placed in quick sign folder.  

## 2020-04-06 NOTE — Telephone Encounter (Signed)
Completed and pt's wife is aware and will pick up tomorrow. Placed in folder up front.

## 2020-04-07 ENCOUNTER — Other Ambulatory Visit: Payer: Self-pay | Admitting: Internal Medicine

## 2020-04-07 NOTE — Telephone Encounter (Signed)
LO: 10-11-19 LS: 02-04-20

## 2020-04-13 ENCOUNTER — Encounter: Payer: Self-pay | Admitting: Dermatology

## 2020-05-14 ENCOUNTER — Other Ambulatory Visit: Payer: Self-pay | Admitting: Internal Medicine

## 2020-07-06 ENCOUNTER — Other Ambulatory Visit: Payer: Self-pay | Admitting: Internal Medicine

## 2020-07-06 NOTE — Telephone Encounter (Signed)
RX Refill:xanax Last Seen:02-04-20 Last ordered:04-07-20

## 2020-07-24 ENCOUNTER — Ambulatory Visit: Payer: Medicare PPO | Admitting: Dermatology

## 2020-07-24 ENCOUNTER — Other Ambulatory Visit: Payer: Self-pay

## 2020-07-24 ENCOUNTER — Encounter: Payer: Self-pay | Admitting: Dermatology

## 2020-07-24 DIAGNOSIS — D170 Benign lipomatous neoplasm of skin and subcutaneous tissue of head, face and neck: Secondary | ICD-10-CM

## 2020-07-24 DIAGNOSIS — L578 Other skin changes due to chronic exposure to nonionizing radiation: Secondary | ICD-10-CM

## 2020-07-24 DIAGNOSIS — L609 Nail disorder, unspecified: Secondary | ICD-10-CM

## 2020-07-24 DIAGNOSIS — Z1283 Encounter for screening for malignant neoplasm of skin: Secondary | ICD-10-CM

## 2020-07-24 DIAGNOSIS — L739 Follicular disorder, unspecified: Secondary | ICD-10-CM | POA: Diagnosis not present

## 2020-07-24 DIAGNOSIS — D1800 Hemangioma unspecified site: Secondary | ICD-10-CM

## 2020-07-24 DIAGNOSIS — L814 Other melanin hyperpigmentation: Secondary | ICD-10-CM | POA: Diagnosis not present

## 2020-07-24 DIAGNOSIS — L304 Erythema intertrigo: Secondary | ICD-10-CM

## 2020-07-24 DIAGNOSIS — L219 Seborrheic dermatitis, unspecified: Secondary | ICD-10-CM

## 2020-07-24 DIAGNOSIS — Z85828 Personal history of other malignant neoplasm of skin: Secondary | ICD-10-CM

## 2020-07-24 DIAGNOSIS — L209 Atopic dermatitis, unspecified: Secondary | ICD-10-CM | POA: Diagnosis not present

## 2020-07-24 DIAGNOSIS — L821 Other seborrheic keratosis: Secondary | ICD-10-CM

## 2020-07-24 DIAGNOSIS — D229 Melanocytic nevi, unspecified: Secondary | ICD-10-CM

## 2020-07-24 MED ORDER — KETOCONAZOLE 2 % EX CREA: TOPICAL_CREAM | CUTANEOUS | 6 refills | Status: DC

## 2020-07-24 MED ORDER — KETOCONAZOLE 2 % EX SHAM: MEDICATED_SHAMPOO | CUTANEOUS | 6 refills | Status: DC

## 2020-07-24 MED ORDER — HYDROCORTISONE 2.5 % EX LOTN: TOPICAL_LOTION | CUTANEOUS | 6 refills | Status: DC

## 2020-07-24 MED ORDER — MOMETASONE FUROATE 0.1 % EX SOLN: CUTANEOUS | 6 refills | Status: DC

## 2020-07-24 NOTE — Progress Notes (Unsigned)
Follow-Up Visit   Subjective  Anthony Allen is a 85 y.o. male who presents for the following: Annual Exam (TBSE - Hx BCC - patient has noticed a patch of skin that peels on his right inner thigh that he would like checked). Seborrheic dermatitis - of the face and scalp, patient needs refills of HC 2.5% cream, Ketoconazole 2% cream, and Mometasone 0.1% solution.  The patient presents for Total-Body Skin Exam (TBSE) for skin cancer screening and mole check. Wife present and provides history.  The following portions of the chart were reviewed this encounter and updated as appropriate:   Tobacco  Allergies  Meds  Problems  Med Hx  Surg Hx  Fam Hx     Review of Systems:  No other skin or systemic complaints except as noted in HPI or Assessment and Plan.  Objective  Well appearing patient in no apparent distress; mood and affect are within normal limits.  A full examination was performed including scalp, head, eyes, ears, nose, lips, neck, chest, axillae, abdomen, back, buttocks, bilateral upper extremities, bilateral lower extremities, hands, feet, fingers, toes, fingernails, and toenails. All findings within normal limits unless otherwise noted below.  Objective  L forehead: Rubbery nodule 4.0 x 3.0 cm   Objective  Face and scalp: Pink patches with greasy scale.   Objective  L lat nose: Well healed scar with no evidence of recurrence.   Objective  Back: Clear.  Objective  B/L lower legs and ankles: Scaly erythematous papules and patches +/- dyspigmentation, lichenification, excoriations.   Objective  B/L toenails: Mild toenail dystrophy.  Objective  Groin: Erythema of the groin    Assessment & Plan  Lipoma of head L forehead Pt and wife decline surgical option. Benign-appearing.  Observation.  Call clinic for new or changing lesions.  Recommend daily use of broad spectrum spf 30+ sunscreen to sun-exposed areas.   Seborrheic dermatitis Face and  scalp Seborrheic Dermatitis  -  is a chronic persistent rash characterized by pinkness and scaling most commonly of the mid face but also can occur on the scalp (dandruff), ears; mid chest and mid back. It tends to be exacerbated by stress and cooler weather.  People who have neurologic disease may experience new onset or exacerbation of existing seborrheic dermatitis.  The condition is not curable but treatable and can be controlled.  Continue Mometasone solution to aa's scalp QOD or 4d/wk PRN.  Continue Ketoconazole 2% cream QOD alternating with HC 2.5% cream QOD.   Start Ketoconazole 2% shampoo let sit 5-10 minutes before washing off. Use 3d/wk.   mometasone (ELOCON) 0.1 % lotion - Face and scalp  ketoconazole (NIZORAL) 2 % cream - Face and scalp  ketoconazole (NIZORAL) 2 % shampoo - Face and scalp  hydrocortisone 2.5 % lotion - Face and scalp  History of basal cell carcinoma L lat nose Clear. Observe for recurrence. Call clinic for new or changing lesions.  Recommend regular skin exams, daily broad-spectrum spf 30+ sunscreen use, and photoprotection.    Folliculitis Back Continue Clindamycin lotion to aa's QD PRN. Chronic; persistent.  Atopic dermatitis, unspecified type B/L lower legs and ankles Atopic dermatitis (eczema) is a chronic, relapsing, pruritic condition that can significantly affect quality of life. It is often associated with allergic rhinitis and/or asthma and can require treatment with topical medications, phototherapy, or in severe cases a biologic medication called Dupixent in older children and adults.   Start HC 2.5% lotion to aa's QD-BID PRN.   Nail problem -  dystrophy B/L toenails Benign appearing, observe.   Erythema intertrigo Groin Start Ketoconazole 2% cream to aa's QD.  Intertrigo is a chronic recurrent rash that occurs in skin fold areas that may be associated with friction; heat; moisture; yeast; fungus; and bacteria.  It is exacerbated by  increased movement / activity; sweating; and higher atmospheric temperature.  Lentigines - Scattered tan macules - Discussed due to sun exposure - Benign, observe - Call for any changes  Seborrheic Keratoses - Stuck-on, waxy, tan-brown papules and plaques  - Discussed benign etiology and prognosis. - Observe - Call for any changes  Melanocytic Nevi - Tan-brown and/or pink-flesh-colored symmetric macules and papules - Benign appearing on exam today - Observation - Call clinic for new or changing moles - Recommend daily use of broad spectrum spf 30+ sunscreen to sun-exposed areas.   Hemangiomas - Red papules - Discussed benign nature - Observe - Call for any changes  Actinic Damage - Chronic, secondary to cumulative UV/sun exposure - diffuse scaly erythematous macules with underlying dyspigmentation - Recommend daily broad spectrum sunscreen SPF 30+ to sun-exposed areas, reapply every 2 hours as needed.  - Call for new or changing lesions.  Skin cancer screening performed today.  Return in about 1 year (around 07/24/2021) for TBSE.  Luther Redo, CMA, am acting as scribe for Sarina Ser, MD .  Documentation: I have reviewed the above documentation for accuracy and completeness, and I agree with the above.  Sarina Ser, MD

## 2020-07-24 NOTE — Patient Instructions (Addendum)
Continue Clindamycin lotion for folliculitis of the back.   Continue Ketoconazole 2% cream to the face every other day alternating with Hydrocortisone 2.5% lotion every other day for seborrheic dermatitis of the face.   Continue Mometasone 0.1% solution to affected areas of the scalp every other day or four days per week as needed for itch.   Start Ketoconazole 2% shampoo - shampoo into the scalp, let sit 5-10 minutes then wash out. Use three days per week.   For eczema of the lower legs and ankles apply Hydrocortisone 2.5% lotion to affected aress once to twice daily as needed for itch.   For the rash on the inner thigh and groin apply Ketoconazole 2% cream to affected areas once daily as needed.

## 2020-07-25 ENCOUNTER — Encounter: Payer: Self-pay | Admitting: Dermatology

## 2020-08-05 ENCOUNTER — Other Ambulatory Visit: Payer: Self-pay | Admitting: Internal Medicine

## 2020-08-07 NOTE — Telephone Encounter (Signed)
RX Refill:xanax Last Seen:02-04-20 Last ordered:07-06-20

## 2020-08-12 ENCOUNTER — Other Ambulatory Visit: Payer: Self-pay | Admitting: Internal Medicine

## 2020-08-29 DIAGNOSIS — H40003 Preglaucoma, unspecified, bilateral: Secondary | ICD-10-CM | POA: Diagnosis not present

## 2020-09-03 ENCOUNTER — Other Ambulatory Visit: Payer: Self-pay | Admitting: Internal Medicine

## 2020-09-05 DIAGNOSIS — H40003 Preglaucoma, unspecified, bilateral: Secondary | ICD-10-CM | POA: Diagnosis not present

## 2020-09-09 ENCOUNTER — Other Ambulatory Visit: Payer: Self-pay | Admitting: Internal Medicine

## 2020-09-11 NOTE — Telephone Encounter (Signed)
Please notify patient that the prescription  was Refilled for 30 days as a courtesy,  Because it has been over 6  months since last visit. Marland Kitchen  OFFICE VISIT NEEDED prior to any more refills

## 2020-09-11 NOTE — Telephone Encounter (Signed)
RX Refill:xanax Last Seen:02-04-20 Last ordered:08-07-20

## 2020-09-14 ENCOUNTER — Ambulatory Visit (INDEPENDENT_AMBULATORY_CARE_PROVIDER_SITE_OTHER): Payer: Medicare PPO

## 2020-09-14 VITALS — Ht 69.0 in | Wt 145.0 lb

## 2020-09-14 DIAGNOSIS — Z0000 Encounter for general adult medical examination without abnormal findings: Secondary | ICD-10-CM

## 2020-09-14 NOTE — Progress Notes (Signed)
Subjective:   Anthony Allen is a 85 y.o. male who presents for Medicare Annual/Subsequent preventive examination.  Review of Systems    No ROS.  Medicare Wellness Virtual Visit.   Cardiac Risk Factors include: advanced age (>60men, >16 women);male gender;hypertension;diabetes mellitus     Objective:    Today's Vitals   09/14/20 1458  Weight: 145 lb (65.8 kg)  Height: 5\' 9"  (1.753 m)   Body mass index is 21.41 kg/m.  Advanced Directives 09/14/2020 09/14/2019 11/30/2018 09/18/2018 11/13/2017 11/10/2017 10/24/2017  Does Patient Have a Medical Advance Directive? Yes Yes No Yes Yes Yes Yes  Type of Paramedic of [place];Living will [employer];Living will Living will - [place];Living will [place];Living will [place];Living will  Does patient want to make changes to medical advance directive? No - Patient declined No - Patient declined No - Patient declined - - - -  Copy of [place] in Chart? No - copy requested No - copy requested No - copy requested - No - copy requested No - copy requested No - copy requested  Would patient like information on creating a medical advance directive? - - No - Patient declined - - - -    Current Medications (verified) Outpatient Encounter Medications as of 09/14/2020  Medication Sig  . acetaminophen (TYLENOL) 500 MG tablet Take 500 mg by mouth every 6 (six) hours as needed.  . ALPRAZolam (XANAX) 0.5 MG tablet TAKE 1 TABLET(0.5 MG) BY MOUTH AT BEDTIME AS NEEDED FOR ANXIETY  . cephALEXin (KEFLEX) 500 MG capsule Take 1 capsule (500 mg total) by mouth 4 (four) times daily.  . Cholecalciferol (VITAMIN D-3) 1000 units CAPS Take 1,000 Units by mouth daily.  . clindamycin (CLEOCIN T) 1 % lotion APPLY EXTERNALLY TO THE AFFECTED AREA EVERY DAY TO TWICE DAILY AS DIRECTED  . clopidogrel (PLAVIX) 75 MG tablet TAKE 1 TABLET(75 MG) BY MOUTH DAILY  .  fluticasone (FLONASE) 50 MCG/ACT nasal spray Place 2 sprays into both nostrils daily as needed for allergies or rhinitis.   Marland Kitchen gabapentin (NEURONTIN) 100 MG capsule   . hydrocortisone 2.5 % lotion Apply to affected areas of face QOD alternating with Ketoconazole 2% cream. Start applying to the lower legs and ankles QD-BID PRN itch.  Marland Kitchen HYDROCORTISONE, TOPICAL, 2 % LOTN Apply 1 application topically as needed (itching).  . JARDIANCE 10 MG TABS tablet TAKE 1 TABLET(10 MG) BY MOUTH DAILY  . ketoconazole (NIZORAL) 2 % cream APPLY EXTERNALLY TO THE AFFECTED AREA AS DIRECTED TO FACE MONDAY, WEDNESDAY, AND FRIDAY. APPLY TO GROIN AREA EVERY DAY  . ketoconazole (NIZORAL) 2 % cream Apply to affected areas of the face QOD alternating with Hydrocortisone 2.5% cream. Use in the groin/inner thigh QD PRN rash.  . ketoconazole (NIZORAL) 2 % shampoo Shampoo into the scalp and face. Let sit 5-10 minutes then wash off. Use 3d/wk.  Marland Kitchen latanoprost (XALATAN) 0.005 % ophthalmic solution Instill 1 drop into both eyes in the evening  . Menthol, Topical Analgesic, (BLUE-EMU MAXIMUM STRENGTH EX) Apply topically.  . mirtazapine (REMERON) 7.5 MG tablet TAKE 1 TABLET(7.5 MG) BY MOUTH AT BEDTIME  . mometasone (ELOCON) 0.1 % lotion APPLY SMALL AMOUNT EXTERNALLY TO THE SCALP EVERY OTHER NIGHT AS DIRECTED  . mometasone (ELOCON) 0.1 % lotion Apply to affected areas of the scalp QOD PRN.  . NYSTATIN powder APPLY TOPICALLY TWICE A DAY AS NEEDED FOR RASH  . rosuvastatin (CRESTOR)  10 MG tablet TAKE 1 TABLET(10 MG) BY MOUTH DAILY  . timolol (TIMOPTIC) 0.5 % ophthalmic solution Instill 1 drop into the right eye in the morning   No facility-administered encounter medications on file as of 09/14/2020.    Allergies (verified) Patient has no allergy information on record.   History: Past Medical History:  Diagnosis Date  . Arthritis    "mild in my hands" (06/04/2017)  . Basal cell carcinoma 01/28/2008   L lat nose   . Complication of  anesthesia    "was told never to use Propofol because of parent's adverse reaction" (06/04/2017)  . CVA (cerebral vascular accident) ([place])    hospitalized from 12/22-12/24 due to left-sided weakness, slurred speech and difficulty swallowing. He was diagnosed as left pontine stroke./notes 06/03/2017  . Family history of adverse reaction to anesthesia    "mother and dad had allergic reaction Propofol" (06/04/2017)  . Glaucoma, both eyes   . Gout   . Hypertension   . Parotid mass   . Stroke (cerebrum) ([place]) 06/03/2017  . Type 2 diabetes mellitus with hyperglycemia [facility])    Past Surgical History:  Procedure Laterality Date  . TONSILLECTOMY  1943   Family History  Problem Relation Age of Onset  . Arthritis Mother   . Stroke Father   . Hypertension Father   . Heart disease Father 19       AMI  . Kidney disease Father        bladder ca congenital loss of kidney  . Arthritis Maternal Grandmother   . Early death Daughter 16       aspiration  . Cancer Brother        Scalp  . Macular degeneration Brother    Social History   Socioeconomic History  . Marital status: Married    Spouse name: Not on file  . Number of children: Not on file  . Years of education: Not on file  . Highest education level: Not on file  Occupational History  . Not on file  Tobacco Use  . Smoking status: Never Smoker  . Smokeless tobacco: Never Used  Vaping Use  . Vaping Use: Never used  Substance and Sexual Activity  . Alcohol use: Yes    Comment: 1 glass of wine per month  . Drug use: No  . Sexual activity: Not Currently  Other Topics Concern  . Not on file  Social History Narrative  . Not on file   Social Determinants of Health   Financial Resource Strain: Low Risk   . Difficulty of Paying Living Expenses: Not hard at all  Food Insecurity: No Food Insecurity  . Worried About Charity fundraiser in the Last Year: Never true  . Ran Out of Food in the Last Year: Never true  Transportation  Needs: No Transportation Needs  . Lack of Transportation (Medical): No  . Lack of Transportation (Non-Medical): No  Physical Activity: Not on file  Stress: No Stress Concern Present  . Feeling of Stress : Not at all  Social Connections: Unknown  . Frequency of Communication with Friends and Family: More than three times a week  . Frequency of Social Gatherings with Friends and Family: Once a week  . Attends Religious Services: Not on file  . Active Member of Clubs or Organizations: Not on file  . Attends Archivist Meetings: Not on file  . Marital Status: Married    Tobacco Counseling Counseling given: Not Answered   Clinical Intake:  Pre-visit preparation completed: Yes        Diabetes: Yes  Appointment scheduled with pcp 10/27/20   How often do you need to have someone help you when you read instructions, pamphlets, or other written materials from your doctor or pharmacy?: 2 - Rarely    Interpreter Needed?: No      Activities of Daily Living In your present state of health, do you have any difficulty performing the following activities: 09/14/2020  Hearing? N  Vision? N  Difficulty concentrating or making decisions? N  Walking or climbing stairs? Y  Comment Unsteady gait. Walker in use.  Dressing or bathing? Y  Comment Wife assist as needed  Doing errands, shopping? Y  Preparing Food and eating ? Y  Comment Wife meal prep. Self feeds.  Using the Toilet? N  In the past six months, have you accidently leaked urine? N  Do you have problems with loss of bowel control? N  Managing your Medications? Y  Comment Wife assist  Managing your Finances? Y  Comment Wife assist  Housekeeping or managing your Housekeeping? Y  Comment Wife assist  Some recent data might be hidden    Patient Care Team: Crecencio Mc, MD as PCP - General (Internal Medicine)  Indicate any recent Medical Services you may have received from other than [facility] providers in the past  year (date may be approximate).     Assessment:   This is a routine wellness examination for Jamahl.  I connected with Roshan today by telephone and verified that I am speaking with the correct person using two identifiers. Location patient: home Location provider: work Persons participating in the virtual visit: patient, Marine scientist.    I discussed the limitations, risks, security and privacy concerns of performing an evaluation and management service by telephone and the availability of in person appointments. The patient expressed understanding and verbally consented to this telephonic visit.    Interactive audio and video telecommunications were attempted between this provider and patient, however failed, due to patient having technical difficulties OR patient did not have access to video capability.  We continued and completed visit with audio only.  Some vital signs may be absent or patient reported.   Hearing/Vision screen  Hearing Screening   125Hz  250Hz  500Hz  1000Hz  2000Hz  3000Hz  4000Hz  6000Hz  8000Hz   Right ear:           Left ear:           Comments: Patient is able to hear conversational tones without difficulty.  No issues reported.  Vision Screening Comments: Followed by [facility] Wears corrective lenses  Glaucoma; drops in use Visits every 6 months No retinopathy reported   Dietary issues and exercise activities discussed: Current Exercise Habits: The patient does not participate in regular exercise at present   Low carb Good fluid intake  Goals    . Follow up with Primary Care Provider     As needed      Depression Screen PHQ 2/9 Scores 09/14/2020 09/14/2019 08/25/2019 10/31/2017 09/05/2016 09/06/2015 05/04/2015  PHQ - 2 Score 0 0 0 1 0 0 0  PHQ- 9 Score - - - 8 - - -    Fall Risk Fall Risk  09/14/2020 02/04/2020 09/14/2019 08/25/2019 04/30/2018  Falls in the past year? 0 0 0 0 0  Number falls in past yr: 0 - - - -  Injury with Fall? 0 - - - -  Risk for  fall due to : Impaired balance/gait - - - -  Follow up Falls evaluation completed Falls evaluation completed Falls evaluation completed;Falls prevention discussed Falls evaluation completed -    FALL RISK PREVENTION PERTAINING TO THE HOME: Home free of loose throw rugs in walkways, pet beds, electrical cords, etc? Yes  Adequate lighting in your home to reduce risk of falls? Yes   ASSISTIVE DEVICES UTILIZED TO PREVENT FALLS: Life home alert? Yes  Use of a cane, walker or w/c? Yes , walker in use   TIMED UP AND GO: Was the test performed? No . Virtual visit.   Cognitive Function: Patient is alert.  Uses ipad for cognitive health exercises.  MMSE - Mini Mental State Exam 09/14/2019 09/05/2016 09/06/2015  Not completed: Unable to complete - -  Orientation to time - 5 5  Orientation to Place - 5 5  Registration - 3 3  Attention/ Calculation - 5 5  Recall - 3 3  Language- name 2 objects - 2 2  Language- repeat - 1 1  Language- follow 3 step command - 3 3  Language- read & follow direction - 1 1  Write a sentence - 1 1  Copy design - 1 1  Total score - 30 30        Immunizations Immunization History  Administered Date(s) Administered  . Fluad Quad(high Dose 65+) 03/03/2019, 03/13/2020  . Influenza Split 06/15/2013, 02/08/2014  . Influenza, High Dose Seasonal PF 02/21/2017, 03/02/2018  . Influenza-Unspecified 02/09/2015, 03/10/2016  . PFIZER(Purple Top)SARS-COV-2 Vaccination 07/09/2019, 07/30/2019, 02/23/2020  . Pneumococcal Conjugate-13 06/23/2013  . Pneumococcal Polysaccharide-23 05/02/2015  . Tdap 06/11/2013  . Zoster 07/01/2013  . Zoster Recombinat (Shingrix) 02/21/2017, 05/03/2017   Health Maintenance Health Maintenance  Topic Date Due  . FOOT EXAM  Never done  . HEMOGLOBIN A1C  03/25/2020  . COVID-19 Vaccine (4 - Booster for Pfizer series) 08/22/2020  . URINE MICROALBUMIN  09/26/2020  . INFLUENZA VACCINE  01/08/2021  . OPHTHALMOLOGY EXAM  02/28/2021  .  TETANUS/TDAP  06/12/2023  . PNA vac Low Risk Adult  Completed  . HPV VACCINES  Aged Out   Colorectal cancer screening: No longer required.   Lung Cancer Screening: (Low Dose CT Chest recommended if Age 57-80 years, 30 pack-year currently smoking OR have quit w/in 15years.) does not qualify.   Hepatitis C Screening: does not qualify.  Dental Screening: Recommended annual dental exams for proper oral hygiene.  Community Resource Referral / Chronic Care Management: CRR required this visit?  No   CCM required this visit?  No      Plan:   Keep all routine maintenance appointments.   Follow up 10/27/20 @ 10:00  I have personally reviewed and noted the following in the patient's chart:   . Medical and social history . Use of alcohol, tobacco or illicit drugs  . Current medications and supplements . Functional ability and status . Nutritional status . Physical activity . Advanced directives . List of other physicians . Hospitalizations, surgeries, and ER visits in previous 12 months . Vitals . Screenings to include cognitive, depression, and falls . Referrals and appointments  In addition, I have reviewed and discussed with patient certain preventive protocols, quality metrics, and best practice recommendations. A written personalized care plan for preventive services as well as general preventive health recommendations were provided to patient via mychart.     Varney Biles, LPN   10/13/8125

## 2020-09-14 NOTE — Patient Instructions (Addendum)
Anthony Allen , Thank you for taking time to come for your Medicare Wellness Visit. I appreciate your ongoing commitment to your health goals. Please review the following plan we discussed and let me know if I can assist you in the future.   These are the goals we discussed: Goals    . Follow up with Primary Care Provider     As needed       This is a list of the screening recommended for you and due dates:  Health Maintenance  Topic Date Due  . Complete foot exam   Never done  . Hemoglobin A1C  03/25/2020  . COVID-19 Vaccine (4 - Booster for Pfizer series) 08/22/2020  . Urine Protein Check  09/26/2020  . Flu Shot  01/08/2021  . Eye exam for diabetics  02/28/2021  . Tetanus Vaccine  06/12/2023  . Pneumonia vaccines  Completed  . HPV Vaccine  Aged Out    Immunizations Immunization History  Administered Date(s) Administered  . Fluad Quad(high Dose 65+) 03/03/2019, 03/13/2020  . Influenza Split 06/15/2013, 02/08/2014  . Influenza, High Dose Seasonal PF 02/21/2017, 03/02/2018  . Influenza-Unspecified 02/09/2015, 03/10/2016  . PFIZER(Purple Top)SARS-COV-2 Vaccination 07/09/2019, 07/30/2019, 02/23/2020  . Pneumococcal Conjugate-13 06/23/2013  . Pneumococcal Polysaccharide-23 05/02/2015  . Tdap 06/11/2013  . Zoster 07/01/2013  . Zoster Recombinat (Shingrix) 02/21/2017, 05/03/2017   Keep all routine maintenance appointments.   Follow up 10/27/20 @ 10:00    Advanced directives: End of life planning; Advance aging; Advanced directives discussed.  Copy of current HCPOA/Living Will requested.    Conditions/risks identified: none new.  Follow up in one year for your annual wellness visit.   Preventive Care 62 Years and Older, Male Preventive care refers to lifestyle choices and visits with your health care provider that can promote health and wellness. What does preventive care include?  A yearly physical exam. This is also called an annual well check.  Dental exams once or  twice a year.  Routine eye exams. Ask your health care provider how often you should have your eyes checked.  Personal lifestyle choices, including:  Daily care of your teeth and gums.  Regular physical activity.  Eating a healthy diet.  Avoiding tobacco and drug use.  Limiting alcohol use.  Practicing safe sex.  Taking low doses of aspirin every day.  Taking vitamin and mineral supplements as recommended by your health care provider. What happens during an annual well check? The services and screenings done by your health care provider during your annual well check will depend on your age, overall health, lifestyle risk factors, and family history of disease. Counseling  Your health care provider may ask you questions about your:  Alcohol use.  Tobacco use.  Drug use.  Emotional well-being.  Home and relationship well-being.  Sexual activity.  Eating habits.  History of falls.  Memory and ability to understand (cognition).  Work and work Statistician. Screening  You may have the following tests or measurements:  Height, weight, and BMI.  Blood pressure.  Lipid and cholesterol levels. These may be checked every 5 years, or more frequently if you are over 16 years old.  Skin check.  Lung cancer screening. You may have this screening every year starting at age 51 if you have a 30-pack-year history of smoking and currently smoke or have quit within the past 15 years.  Fecal occult blood test (FOBT) of the stool. You may have this test every year starting at age 36.  Flexible  sigmoidoscopy or colonoscopy. You may have a sigmoidoscopy every 5 years or a colonoscopy every 10 years starting at age 73.  Prostate cancer screening. Recommendations will vary depending on your family history and other risks.  Hepatitis C blood test.  Hepatitis B blood test.  Sexually transmitted disease (STD) testing.  Diabetes screening. This is done by checking your blood  sugar (glucose) after you have not eaten for a while (fasting). You may have this done every 1-3 years.  Abdominal aortic aneurysm (AAA) screening. You may need this if you are a current or former smoker.  Osteoporosis. You may be screened starting at age 71 if you are at high risk. Talk with your health care provider about your test results, treatment options, and if necessary, the need for more tests. Vaccines  Your health care provider may recommend certain vaccines, such as:  Influenza vaccine. This is recommended every year.  Tetanus, diphtheria, and acellular pertussis (Tdap, Td) vaccine. You may need a Td booster every 10 years.  Zoster vaccine. You may need this after age 93.  Pneumococcal 13-valent conjugate (PCV13) vaccine. One dose is recommended after age 48.  Pneumococcal polysaccharide (PPSV23) vaccine. One dose is recommended after age 47. Talk to your health care provider about which screenings and vaccines you need and how often you need them. This information is not intended to replace advice given to you by your health care provider. Make sure you discuss any questions you have with your health care provider. Document Released: 06/23/2015 Document Revised: 02/14/2016 Document Reviewed: 03/28/2015 Elsevier Interactive Patient Education  2017 [employer] Prevention in the Home Falls can cause injuries. They can happen to people of all ages. There are many things you can do to make your home safe and to help prevent falls. What can I do on the outside of my home?  Regularly fix the edges of walkways and driveways and fix any cracks.  Remove anything that might make you trip as you walk through a door, such as a raised step or threshold.  Trim any bushes or trees on the path to your home.  Use bright outdoor lighting.  Clear any walking paths of anything that might make someone trip, such as rocks or tools.  Regularly check to see if handrails are loose or  broken. Make sure that both sides of any steps have handrails.  Any raised decks and porches should have guardrails on the edges.  Have any leaves, snow, or ice cleared regularly.  Use sand or salt on walking paths during winter.  Clean up any spills in your garage right away. This includes oil or grease spills. What can I do in the bathroom?  Use night lights.  Install grab bars by the toilet and in the tub and shower. Do not use towel bars as grab bars.  Use non-skid mats or decals in the tub or shower.  If you need to sit down in the shower, use a plastic, non-slip stool.  Keep the floor dry. Clean up any water that spills on the floor as soon as it happens.  Remove soap buildup in the tub or shower regularly.  Attach bath mats securely with double-sided non-slip rug tape.  Do not have throw rugs and other things on the floor that can make you trip. What can I do in the bedroom?  Use night lights.  Make sure that you have a light by your bed that is easy to reach.  Do not  use any sheets or blankets that are too big for your bed. They should not hang down onto the floor.  Have a firm chair that has side arms. You can use this for support while you get dressed.  Do not have throw rugs and other things on the floor that can make you trip. What can I do in the kitchen?  Clean up any spills right away.  Avoid walking on wet floors.  Keep items that you use a lot in easy-to-reach places.  If you need to reach something above you, use a strong step stool that has a grab bar.  Keep electrical cords out of the way.  Do not use floor polish or wax that makes floors slippery. If you must use wax, use non-skid floor wax.  Do not have throw rugs and other things on the floor that can make you trip. What can I do with my stairs?  Do not leave any items on the stairs.  Make sure that there are handrails on both sides of the stairs and use them. Fix handrails that are  broken or loose. Make sure that handrails are as long as the stairways.  Check any carpeting to make sure that it is firmly attached to the stairs. Fix any carpet that is loose or worn.  Avoid having throw rugs at the top or bottom of the stairs. If you do have throw rugs, attach them to the floor with carpet tape.  Make sure that you have a light switch at the top of the stairs and the bottom of the stairs. If you do not have them, ask someone to add them for you. What else can I do to help prevent falls?  Wear shoes that:  Do not have high heels.  Have rubber bottoms.  Are comfortable and fit you well.  Are closed at the toe. Do not wear sandals.  If you use a stepladder:  Make sure that it is fully opened. Do not climb a closed stepladder.  Make sure that both sides of the stepladder are locked into place.  Ask someone to hold it for you, if possible.  Clearly mark and make sure that you can see:  Any grab bars or handrails.  First and last steps.  Where the edge of each step is.  Use tools that help you move around (mobility aids) if they are needed. These include:  Canes.  Walkers.  Scooters.  Crutches.  Turn on the lights when you go into a dark area. Replace any light bulbs as soon as they burn out.  Set up your furniture so you have a clear path. Avoid moving your furniture around.  If any of your floors are uneven, fix them.  If there are any pets around you, be aware of where they are.  Review your medicines with your doctor. Some medicines can make you feel dizzy. This can increase your chance of falling. Ask your doctor what other things that you can do to help prevent falls. This information is not intended to replace advice given to you by your health care provider. Make sure you discuss any questions you have with your health care provider. Document Released: 03/23/2009 Document Revised: 11/02/2015 Document Reviewed: 07/01/2014 Elsevier  Interactive Patient Education  2017 [employer].

## 2020-10-10 ENCOUNTER — Other Ambulatory Visit: Payer: Self-pay | Admitting: Internal Medicine

## 2020-10-11 MED ORDER — ALPRAZOLAM 0.5 MG PO TABS: ORAL_TABLET | ORAL | 1 refills | Status: DC

## 2020-10-11 NOTE — Telephone Encounter (Signed)
Refill sent .  Visit rescheduled

## 2020-10-11 NOTE — Telephone Encounter (Signed)
RX Refill:xanax Last Seen:02-04-20 Last ordered:09-11-20

## 2020-10-27 ENCOUNTER — Ambulatory Visit: Payer: Medicare PPO | Admitting: Internal Medicine

## 2020-11-02 ENCOUNTER — Ambulatory Visit: Payer: Medicare PPO | Admitting: Internal Medicine

## 2020-11-07 ENCOUNTER — Other Ambulatory Visit: Payer: Self-pay | Admitting: Internal Medicine

## 2020-11-09 ENCOUNTER — Encounter: Payer: Self-pay | Admitting: Internal Medicine

## 2020-11-09 ENCOUNTER — Other Ambulatory Visit: Payer: Self-pay

## 2020-11-09 ENCOUNTER — Ambulatory Visit: Payer: Medicare PPO | Admitting: Internal Medicine

## 2020-11-09 ENCOUNTER — Telehealth: Payer: Self-pay | Admitting: Internal Medicine

## 2020-11-09 VITALS — BP 110/70 | HR 84 | Temp 96.5°F | Ht 69.02 in | Wt 142.5 lb

## 2020-11-09 DIAGNOSIS — I700 Atherosclerosis of aorta: Secondary | ICD-10-CM | POA: Diagnosis not present

## 2020-11-09 DIAGNOSIS — E559 Vitamin D deficiency, unspecified: Secondary | ICD-10-CM

## 2020-11-09 DIAGNOSIS — N1831 Chronic kidney disease, stage 3a: Secondary | ICD-10-CM

## 2020-11-09 DIAGNOSIS — E1169 Type 2 diabetes mellitus with other specified complication: Secondary | ICD-10-CM | POA: Diagnosis not present

## 2020-11-09 DIAGNOSIS — I10 Essential (primary) hypertension: Secondary | ICD-10-CM

## 2020-11-09 DIAGNOSIS — E1122 Type 2 diabetes mellitus with diabetic chronic kidney disease: Secondary | ICD-10-CM | POA: Diagnosis not present

## 2020-11-09 DIAGNOSIS — E785 Hyperlipidemia, unspecified: Secondary | ICD-10-CM

## 2020-11-09 DIAGNOSIS — D751 Secondary polycythemia: Secondary | ICD-10-CM

## 2020-11-09 DIAGNOSIS — K116 Mucocele of salivary gland: Secondary | ICD-10-CM

## 2020-11-09 DIAGNOSIS — R634 Abnormal weight loss: Secondary | ICD-10-CM

## 2020-11-09 DIAGNOSIS — I69351 Hemiplegia and hemiparesis following cerebral infarction affecting right dominant side: Secondary | ICD-10-CM

## 2020-11-09 LAB — CBC WITH DIFFERENTIAL/PLATELET
Basophils Absolute: 0.1 10*3/uL (ref 0.0–0.1)
Basophils Relative: 0.8 % (ref 0.0–3.0)
Eosinophils Absolute: 0.2 10*3/uL (ref 0.0–0.7)
Eosinophils Relative: 2.9 % (ref 0.0–5.0)
HCT: 52.6 % — ABNORMAL HIGH (ref 39.0–52.0)
Hemoglobin: 18 g/dL (ref 13.0–17.0)
Lymphocytes Relative: 10 % — ABNORMAL LOW (ref 12.0–46.0)
Lymphs Abs: 0.9 10*3/uL (ref 0.7–4.0)
MCHC: 34.2 g/dL (ref 30.0–36.0)
MCV: 94.9 fl (ref 78.0–100.0)
Monocytes Absolute: 0.4 10*3/uL (ref 0.1–1.0)
Monocytes Relative: 5 % (ref 3.0–12.0)
Neutro Abs: 7 10*3/uL (ref 1.4–7.7)
Neutrophils Relative %: 81.3 % — ABNORMAL HIGH (ref 43.0–77.0)
Platelets: 149 10*3/uL — ABNORMAL LOW (ref 150.0–400.0)
RBC: 5.55 Mil/uL (ref 4.22–5.81)
RDW: 13.6 % (ref 11.5–15.5)
WBC: 8.7 10*3/uL (ref 4.0–10.5)

## 2020-11-09 LAB — VITAMIN B12: Vitamin B-12: 599 pg/mL (ref 211–911)

## 2020-11-09 LAB — HEMOGLOBIN A1C: Hgb A1c MFr Bld: 6.2 % (ref 4.6–6.5)

## 2020-11-09 LAB — VITAMIN D 25 HYDROXY (VIT D DEFICIENCY, FRACTURES): VITD: 51.61 ng/mL (ref 30.00–100.00)

## 2020-11-09 NOTE — Patient Instructions (Signed)
You need 60 grams of protein daily ,  So add a chicken cutlet or hamburger steak on those days you are eating pasta, or a 3rd protein shake,    But:   Try not to have too many days of JUST PROTEIN SHAKES.

## 2020-11-09 NOTE — Assessment & Plan Note (Addendum)
Secondary to DM and hypertension.  Avoiding NSAIDs.  Lytes,  PTH all normal.   Lab Results  Component Value Date   PTH 32 11/09/2020   CALCIUM 9.9 11/09/2020   CAION 1.22 06/03/2017   Lab Results  Component Value Date   CREATININE 1.46 11/09/2020   Lab Results  Component Value Date   NA 143 11/09/2020   K 4.1 11/09/2020   CL 105 11/09/2020   CO2 23 11/09/2020

## 2020-11-09 NOTE — Progress Notes (Signed)
Subjective:  Patient ID: Anthony Allen, male    DOB: [birth year]  Age: 85 y.o. MRN: [id number]  CC: The primary encounter diagnosis was Essential hypertension, benign. Diagnoses of Stage 3a chronic kidney disease ([place]), Type 2 diabetes mellitus with diabetic chronic kidney disease, unspecified CKD stage, unspecified whether long term insulin use ([place]), Acquired polycythemia, Vitamin D deficiency, Hyperlipidemia associated with type 2 diabetes mellitus ([place]), Aortic atherosclerosis ([place]), Polycythemia, secondary, Weight loss, unintentional, Parotid cyst, and Hemiplegia and hemiparesis following cerebral infarction affecting right dominant side [facility]) were also pertinent to this visit.  HPI Anthony Allen presents for follow up on multiple issues.  He has been lost to follow up for one year.   This visit occurred during the SARS-CoV-2 public health emergency.  Safety protocols were in place, including screening questions prior to the visit, additional usage of staff PPE, and extensive cleaning of exam room while observing appropriate contact time as indicated for disinfecting solutions.   Hemiplegia from prior CVA :  His speech and mood lability have improved.  He is ambulating with a walker and able to transfer without assistance.   Aortic atherosclerosis: Reviewed findings of prior CT scan today from 2014 .  Patient is tolerating high potency statin therapy   Polycythemia:  Has not seen oncology since June 2020.  Last CBC April 21.  Plan was to repeat phlebotomy for hct > 55   Unintentional weight loss:  Diet reviewed.  He is drinking  2  20 g protein shakes daily and eating  One meal : sometimes low in protein .  Chronic kidney disease:  Noted in 2019 with stable GFR thus far .  Avoiding NSAIDs.   Lipoma of forehead:  Evaluated by dermatology.  No plans for removal.   Type 2 DM:  He feels generally well, is not exercising ,  checking blood sugars once daily at variable times.  BS have  been under 130 fasting and < 150 post prandially.  Denies any recent hypoglyemic events.  Taking his medications as directed. Tolerating Jardiance. Following a carbohydrate modified diet 6 days per week. Denies numbness, burning and tingling of extremities. Appetite is poor.  Diet reviewed. He has lost 3 lbs since last visit one year ago    Outpatient Medications Prior to Visit  Medication Sig Dispense Refill  . acetaminophen (TYLENOL) 500 MG tablet Take 500 mg by mouth every 6 (six) hours as needed.    . ALPRAZolam (XANAX) 0.5 MG tablet TAKE 1 TABLET(0.5 MG) BY MOUTH AT BEDTIME AS NEEDED FOR ANXIETY 30 tablet 1  . Cholecalciferol (VITAMIN D-3) 1000 units CAPS Take 1,000 Units by mouth daily.    . clindamycin (CLEOCIN T) 1 % lotion APPLY EXTERNALLY TO THE AFFECTED AREA EVERY DAY TO TWICE DAILY AS DIRECTED 60 mL 6  . clopidogrel (PLAVIX) 75 MG tablet TAKE 1 TABLET(75 MG) BY MOUTH DAILY 90 tablet 1  . fluticasone (FLONASE) 50 MCG/ACT nasal spray Place 2 sprays into both nostrils daily as needed for allergies or rhinitis.     Marland Kitchen gabapentin (NEURONTIN) 100 MG capsule     . hydrocortisone 2.5 % lotion Apply to affected areas of face QOD alternating with Ketoconazole 2% cream. Start applying to the lower legs and ankles QD-BID PRN itch. 118 mL 6  . HYDROCORTISONE, TOPICAL, 2 % LOTN Apply 1 application topically as needed (itching).    . JARDIANCE 10 MG TABS tablet TAKE 1 TABLET(10 MG) BY MOUTH DAILY 90 tablet 1  .  ketoconazole (NIZORAL) 2 % cream APPLY EXTERNALLY TO THE AFFECTED AREA AS DIRECTED TO FACE MONDAY, WEDNESDAY, AND FRIDAY. APPLY TO GROIN AREA EVERY DAY 60 g 3  . ketoconazole (NIZORAL) 2 % cream Apply to affected areas of the face QOD alternating with Hydrocortisone 2.5% cream. Use in the groin/inner thigh QD PRN rash. 60 g 6  . ketoconazole (NIZORAL) 2 % shampoo Shampoo into the scalp and face. Let sit 5-10 minutes then wash off. Use 3d/wk. 120 mL 6  . latanoprost (XALATAN) 0.005 % ophthalmic  solution Instill 1 drop into both eyes in the evening  0  . Menthol, Topical Analgesic, (BLUE-EMU MAXIMUM STRENGTH EX) Apply topically.    . mirtazapine (REMERON) 7.5 MG tablet TAKE 1 TABLET(7.5 MG) BY MOUTH AT BEDTIME 90 tablet 1  . mometasone (ELOCON) 0.1 % lotion APPLY SMALL AMOUNT EXTERNALLY TO THE SCALP EVERY OTHER NIGHT AS DIRECTED 60 mL 1  . mometasone (ELOCON) 0.1 % lotion Apply to affected areas of the scalp QOD PRN. 60 mL 6  . NYSTATIN powder APPLY TOPICALLY TWICE A DAY AS NEEDED FOR RASH 60 g 2  . rosuvastatin (CRESTOR) 10 MG tablet TAKE 1 TABLET(10 MG) BY MOUTH DAILY 90 tablet 1  . timolol (TIMOPTIC) 0.5 % ophthalmic solution Instill 1 drop into the right eye in the morning  0  . cephALEXin (KEFLEX) 500 MG capsule Take 1 capsule (500 mg total) by mouth 4 (four) times daily. (Patient not taking: Reported on 11/09/2020) 28 capsule 0   No facility-administered medications prior to visit.    Review of Systems;  Patient denies headache, fevers, malaise, unintentional weight loss, skin rash, eye pain, sinus congestion and sinus pain, sore throat, dysphagia,  hemoptysis , cough, dyspnea, wheezing, chest pain, palpitations, orthopnea, edema, abdominal pain, nausea, melena, diarrhea, constipation, flank pain, dysuria, hematuria, urinary  Frequency, nocturia, numbness, tingling, seizures,  Focal weakness, Loss of consciousness,  Tremor, insomnia, depression, anxiety, and suicidal ideation.      Objective:  BP 110/70 (BP Location: Left Arm, Patient Position: Sitting)   Pulse 84   Temp (!) 96.5 F (35.8 C)   Ht 5' 9.02" (1.753 m)   Wt 142 lb 8 oz (64.6 kg)   SpO2 97%   BMI 21.03 kg/m   BP Readings from Last 3 Encounters:  11/09/20 110/70  02/04/20 111/64  09/14/19 102/63    Wt Readings from Last 3 Encounters:  11/09/20 142 lb 8 oz (64.6 kg)  09/14/20 145 lb (65.8 kg)  02/04/20 145 lb (65.8 kg)    General appearance: alert, cooperative and appears stated age Ears: normal TM's  and external ear canals both ears Throat: lips, mucosa, and tongue normal; teeth and gums normal Neck: no adenopathy, no carotid bruit, supple, symmetrical, trachea midline and thyroid not enlarged, symmetric, no tenderness/mass/nodules Back: symmetric, no curvature. ROM normal. No CVA tenderness. Lungs: clear to auscultation bilaterally Heart: regular rate and rhythm, S1, S2 normal, no murmur, click, rub or gallop Abdomen: soft, non-tender; bowel sounds normal; no masses,  no organomegaly Pulses: 2+ and symmetric Skin: Skin color, texture, turgor normal. No rashes or lesions Lymph nodes: Cervical, supraclavicular, and axillary nodes normal.  Lab Results  Component Value Date   HGBA1C 6.2 11/09/2020   HGBA1C 6.1 09/24/2019   HGBA1C 5.9 09/17/2018    Lab Results  Component Value Date   CREATININE 1.46 11/09/2020   CREATININE 1.54 (H) 09/24/2019   CREATININE 1.41 09/17/2018    Lab Results  Component Value Date  WBC 8.7 11/09/2020   HGB 18.0 Repeated and verified X2. (HH) 11/09/2020   HCT 52.6 (H) 11/09/2020   PLT 149.0 (L) 11/09/2020   GLUCOSE 140 (H) 11/09/2020   CHOL 93 11/09/2020   TRIG 145.0 11/09/2020   HDL 35.90 (L) 11/09/2020   LDLDIRECT 117.0 05/06/2017   LDLCALC 28 11/09/2020   ALT 24 11/09/2020   AST 22 11/09/2020   NA 143 11/09/2020   K 4.1 11/09/2020   CL 105 11/09/2020   CREATININE 1.46 11/09/2020   BUN 20 11/09/2020   CO2 23 11/09/2020   TSH 1.561 06/01/2017   PSA 1.52 06/23/2013   INR 1.05 06/03/2017   HGBA1C 6.2 11/09/2020   MICROALBUR 2.2 (H) 09/27/2019    MR FACE/TRIGEMINAL WO/W CM  Result Date: 10/28/2017 CLINICAL DATA:  Left parapharyngeal mass. EXAM: MRI FACE TRIGEMINAL WITHOUT AND WITH CONTRAST TECHNIQUE: Multiplanar, multiecho pulse sequences of the face and surrounding structures, including thin slice imaging of the course of the Trigeminal Nerves, were obtained both before and after administration of intravenous contrast. CONTRAST:  72mL  MULTIHANCE GADOBENATE DIMEGLUMINE 529 MG/ML IV SOLN COMPARISON:  Brain MRI 06/01/2017 FINDINGS: There are old infarcts of the right corona radiata and left pons. The visualized intracranial contents are otherwise unremarkable. 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 There is no extension into the skull base. The lesion measures approximately 4.0 x 0.7 x 2.3 cm. There is no other skull base abnormality. Dedicated trigeminal nerve imaging was also performed. There is no focal abnormality along the trigeminal nerves. Meckel's cave is patent bilaterally. No abnormality at the foramina rotundum or ovale. IMPRESSION: 1. 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 Histologic sampling should be considered. 2. No extension through the skull base. Normal appearance of the visible cranial nerves. 3. 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 Electronically Signed   By: Ulyses Jarred M.D.   On: 10/28/2017 15:44    Assessment & Plan:   Problem List Items Addressed This Visit      Unprioritized   Acquired polycythemia   Relevant Orders   CBC with Differential/Platelet (Completed)   Aortic atherosclerosis ([place])    Noted on 2014 CT .  Reviewed findings of prior CT scan today..  Patient is tolerating high potency statin therapy       CKD (chronic kidney disease), stage III ([place])    Secondary to DM and hypertension.  Avoiding NSAIDs.  Lytes,  PTH all normal.   Lab Results  Component Value Date   PTH 32 11/09/2020   CALCIUM 9.9 11/09/2020   CAION 1.22 06/03/2017   Lab Results  Component Value Date   CREATININE 1.46 11/09/2020   Lab  Results  Component Value Date   NA 143 11/09/2020   K 4.1 11/09/2020   CL 105 11/09/2020   CO2 23 11/09/2020          Relevant Orders   PTH, Intact and Calcium (Completed)   RESOLVED: Essential hypertension, benign - Primary    I am suspending his losartan for BP 90/59 and have asked Lynda to recheck his BP in 3 days.       Hemiplegia and hemiparesis following cerebral infarction  affecting right dominant side ([place])    He ambulates with a walker at home and is able to transfer from bed to chair without assistance.  The bathroom has been updated and he is able to access the tub using a straddling  Tub chair.  He is more independent than he was last year. Encouarged him to start exercising more      Hyperlipidemia associated with type 2 diabetes mellitus ([place])    Managed with rosuvastatin 10 mg daily. LDL is well under 70n and LFTS are normal ; no changes to regimen   Lab Results  Component Value Date   CHOL 93 11/09/2020   HDL 35.90 (L) 11/09/2020   LDLCALC 28 11/09/2020   LDLDIRECT 117.0 05/06/2017   TRIG 145.0 11/09/2020   CHOLHDL 3 11/09/2020   Lab Results  Component Value Date   ALT 24 11/09/2020   AST 22 11/09/2020   ALKPHOS 90 11/09/2020   BILITOT 1.0 11/09/2020         Parotid cyst    4 cm ,  No intervention  planned (2019,  Mc[facility])      Polycythemia, secondary    Etiology unclear.  Hgb was 18.8 in Dec 2018 during admission for pontine CVA. Jak 2 mutation screen was negative. He was evaluated by  Dr Janese Banks and advised to tret with phlebotomy for hgb/hgct >18/55.  Will have him follow up with her.   Lab Results  Component Value Date   WBC 8.7 11/09/2020   HGB 18.0 Repeated and verified X2. (HH) 11/09/2020   HCT 52.6 (H) 11/09/2020   MCV 94.9 11/09/2020   PLT 149.0 (L) 11/09/2020         Type II diabetes mellitus with renal manifestations ([place])    Historically well-controlled on Jardiance.  hemoglobin A1c is < 6.5.  Patient  is up-to-date on eye exams and  foot exam is normal today  .  Patient is due for urine microalbumin but could not void and will return . Patient is tolerating statin therapy for CAD risk reduction and ACE/ARB has been suspended due to hypotension,  He has mild microalbuminuria .  Lab Results  Component Value Date   HGBA1C 6.2 11/09/2020   Lab Results  Component Value Date   MICROALBUR 2.2 (H) 09/27/2019   No results found for: LIPASE Lab Results  Component Value Date   CHOL 93 11/09/2020   HDL 35.90 (L) 11/09/2020   LDLCALC 28 11/09/2020   LDLDIRECT 117.0 05/06/2017   TRIG 145.0 11/09/2020   CHOLHDL 3 11/09/2020        Relevant Orders   Comprehensive metabolic panel (Completed)   Hemoglobin A1c (Completed)   Lipid panel (Completed)   Vitamin B12 (Completed)   Microalbumin / creatinine urine ratio   Vitamin D deficiency   Relevant Orders   VITAMIN D 25 Hydroxy (Vit-D Deficiency, Fractures) (Completed)   Weight loss, unintentional    Previously Resolved with appetite and mood improvement  Continue Remeron but will advise to increase dose from 7.5 mg to 15mg          A total of 40 minutes was spent with patient more than half of which was spent in counseling patient on  His hemiplegia,  Diabetes,   reviewing and explaining recent labs and imaging studies done, and coordination of care. I have discontinued [place] Dense. Kreiser's cephALEXin. I am also having him maintain his HYDROCORTISONE (TOPICAL), latanoprost, timolol, fluticasone, Vitamin D-3, (Menthol, Topical Analgesic, (BLUE-EMU MAXIMUM STRENGTH EX)), acetaminophen, gabapentin,  nystatin, ketoconazole, mometasone, clindamycin, clopidogrel, mometasone, ketoconazole, ketoconazole, hydrocortisone, rosuvastatin, mirtazapine, ALPRAZolam, and Jardiance.  No orders of the defined types were placed in this encounter.   Medications Discontinued During This Encounter  Medication Reason  . cephALEXin (KEFLEX) 500 MG capsule     Follow-up: Return in about 6 months  (around 05/11/2021).   Crecencio Mc, MD

## 2020-11-09 NOTE — Telephone Encounter (Addendum)
CRITICAL VALUE STICKER  CRITICAL VALUE:Hemoglobin 18  RECEIVER (on-site recipient of call):Juandiego Kolenovic  DATE & TIME NOTIFIED: 11/09/2020  MESSENGER (representative from lab):[email or web address]  MD NOTIFIED: Dr Kelly Services in Dr. Derrel Nip absence  TIME OF NOTIFICATION:3:55pm  RESPONSE:

## 2020-11-09 NOTE — Telephone Encounter (Signed)
Hemoglobin slightly increased if not worked up previously will need to be working up PCP, hematology, pulmonary  Disc with PCP

## 2020-11-10 LAB — COMPREHENSIVE METABOLIC PANEL
ALT: 24 U/L (ref 0–53)
AST: 22 U/L (ref 0–37)
Albumin: 4.4 g/dL (ref 3.5–5.2)
Alkaline Phosphatase: 90 U/L (ref 39–117)
BUN: 20 mg/dL (ref 6–23)
CO2: 23 mEq/L (ref 19–32)
Calcium: 9.9 mg/dL (ref 8.4–10.5)
Chloride: 105 mEq/L (ref 96–112)
Creatinine, Ser: 1.46 mg/dL (ref 0.40–1.50)
GFR: 44.46 mL/min — ABNORMAL LOW (ref >60.00)
Glucose, Bld: 140 mg/dL — ABNORMAL HIGH (ref 70–99)
Potassium: 4.1 mEq/L (ref 3.5–5.1)
Sodium: 143 mEq/L (ref 135–145)
Total Bilirubin: 1 mg/dL (ref 0.2–1.2)
Total Protein: 7 g/dL (ref 6.0–8.3)

## 2020-11-10 LAB — LIPID PANEL
Cholesterol: 93 mg/dL (ref 0–200)
HDL: 35.9 mg/dL — ABNORMAL LOW (ref >39.00)
LDL Cholesterol: 28 mg/dL (ref 0–99)
NonHDL: 57.28
Total CHOL/HDL Ratio: 3
Triglycerides: 145 mg/dL (ref 0.0–149.0)
VLDL: 29 mg/dL (ref 0.0–40.0)

## 2020-11-11 ENCOUNTER — Encounter: Payer: Self-pay | Admitting: Internal Medicine

## 2020-11-11 DIAGNOSIS — I69351 Hemiplegia and hemiparesis following cerebral infarction affecting right dominant side: Secondary | ICD-10-CM | POA: Insufficient documentation

## 2020-11-11 LAB — PTH, INTACT AND CALCIUM
Calcium: 9.5 mg/dL (ref 8.6–10.3)
PTH: 32 pg/mL (ref 16–77)

## 2020-11-11 NOTE — Assessment & Plan Note (Signed)
Noted on 2014 CT .  Reviewed findings of prior CT scan today..  Patient is tolerating high potency statin therapy

## 2020-11-11 NOTE — Assessment & Plan Note (Addendum)
He ambulates with a walker at home and is able to transfer from bed to chair without assistance.  The bathroom has been updated and he is able to access the tub using a straddling  Tub chair.  He is more independent than he was last year. Encouarged him to start exercising more

## 2020-11-11 NOTE — Assessment & Plan Note (Signed)
Etiology unclear.  Hgb was 18.8 in Dec 2018 during admission for pontine CVA. Jak 2 mutation screen was negative. He was evaluated by  Dr Janese Banks and advised to tret with phlebotomy for hgb/hgct >18/55.  Will have him follow up with her.   Lab Results  Component Value Date   WBC 8.7 11/09/2020   HGB 18.0 Repeated and verified X2. (HH) 11/09/2020   HCT 52.6 (H) 11/09/2020   MCV 94.9 11/09/2020   PLT 149.0 (L) 11/09/2020

## 2020-11-11 NOTE — Assessment & Plan Note (Addendum)
Historically well-controlled on Jardiance.  hemoglobin A1c is < 6.5.  Patient  is up-to-date on eye exams and foot exam is normal today  .  Patient is due for urine microalbumin but could not void and will return . Patient is tolerating statin therapy for CAD risk reduction and ACE/ARB has been suspended due to hypotension,  He has mild microalbuminuria .  Lab Results  Component Value Date   HGBA1C 6.2 11/09/2020   Lab Results  Component Value Date   MICROALBUR 2.2 (H) 09/27/2019   No results found for: LIPASE Lab Results  Component Value Date   CHOL 93 11/09/2020   HDL 35.90 (L) 11/09/2020   LDLCALC 28 11/09/2020   LDLDIRECT 117.0 05/06/2017   TRIG 145.0 11/09/2020   CHOLHDL 3 11/09/2020

## 2020-11-11 NOTE — Assessment & Plan Note (Signed)
Previously Resolved with appetite and mood improvement  Continue Remeron but will advise to increase dose from 7.5 mg to 15mg 

## 2020-11-11 NOTE — Assessment & Plan Note (Signed)
I am suspending his losartan for BP 90/59 and have asked Lynda to recheck his BP in 3 days.

## 2020-11-11 NOTE — Assessment & Plan Note (Signed)
4 cm ,  No intervention  planned (2019,  [facility])

## 2020-11-11 NOTE — Assessment & Plan Note (Signed)
Managed with rosuvastatin 10 mg daily. LDL is well under 70n and LFTS are normal ; no changes to regimen   Lab Results  Component Value Date   CHOL 93 11/09/2020   HDL 35.90 (L) 11/09/2020   LDLCALC 28 11/09/2020   LDLDIRECT 117.0 05/06/2017   TRIG 145.0 11/09/2020   CHOLHDL 3 11/09/2020   Lab Results  Component Value Date   ALT 24 11/09/2020   AST 22 11/09/2020   ALKPHOS 90 11/09/2020   BILITOT 1.0 11/09/2020

## 2020-11-13 LAB — MICROALBUMIN / CREATININE URINE RATIO
Creatinine,U: 94.4 mg/dL
Microalb Creat Ratio: 6.3 mg/g (ref 0.0–30.0)
Microalb, Ur: 6 mg/dL — ABNORMAL HIGH (ref 0.0–1.9)

## 2020-11-19 ENCOUNTER — Other Ambulatory Visit: Payer: Self-pay | Admitting: Internal Medicine

## 2020-11-24 ENCOUNTER — Inpatient Hospital Stay: Payer: Medicare PPO

## 2020-11-24 ENCOUNTER — Other Ambulatory Visit: Payer: Self-pay

## 2020-11-24 ENCOUNTER — Inpatient Hospital Stay: Payer: Medicare PPO | Attending: Oncology | Admitting: Oncology

## 2020-11-24 VITALS — BP 151/73 | HR 97 | Temp 97.7°F | Ht 69.0 in | Wt 143.0 lb

## 2020-11-24 VITALS — BP 132/82 | HR 84 | Resp 18

## 2020-11-24 DIAGNOSIS — Z79899 Other long term (current) drug therapy: Secondary | ICD-10-CM | POA: Insufficient documentation

## 2020-11-24 DIAGNOSIS — R5383 Other fatigue: Secondary | ICD-10-CM | POA: Insufficient documentation

## 2020-11-24 DIAGNOSIS — Z85828 Personal history of other malignant neoplasm of skin: Secondary | ICD-10-CM | POA: Insufficient documentation

## 2020-11-24 DIAGNOSIS — Z841 Family history of disorders of kidney and ureter: Secondary | ICD-10-CM | POA: Insufficient documentation

## 2020-11-24 DIAGNOSIS — D751 Secondary polycythemia: Secondary | ICD-10-CM

## 2020-11-24 DIAGNOSIS — Z83518 Family history of other specified eye disorder: Secondary | ICD-10-CM | POA: Insufficient documentation

## 2020-11-24 DIAGNOSIS — Z8673 Personal history of transient ischemic attack (TIA), and cerebral infarction without residual deficits: Secondary | ICD-10-CM | POA: Insufficient documentation

## 2020-11-24 DIAGNOSIS — Z808 Family history of malignant neoplasm of other organs or systems: Secondary | ICD-10-CM | POA: Insufficient documentation

## 2020-11-24 DIAGNOSIS — Z8249 Family history of ischemic heart disease and other diseases of the circulatory system: Secondary | ICD-10-CM | POA: Diagnosis not present

## 2020-11-24 DIAGNOSIS — E119 Type 2 diabetes mellitus without complications: Secondary | ICD-10-CM | POA: Insufficient documentation

## 2020-11-24 DIAGNOSIS — Z8261 Family history of arthritis: Secondary | ICD-10-CM | POA: Insufficient documentation

## 2020-11-24 LAB — HEMOGLOBIN AND HEMATOCRIT, BLOOD
HCT: 49.1 % (ref 39.0–52.0)
Hemoglobin: 17.1 g/dL — ABNORMAL HIGH (ref 13.0–17.0)

## 2020-11-24 NOTE — Patient Instructions (Signed)
[facility] ONCOLOGY   Discharge Instructions: Thank you for choosing [facility] to provide your oncology and hematology care.  If you have a lab appointment with the [facility], please go directly to the [facility] and check in at the registration area.  We strive to give you quality time with your provider. You may need to reschedule your appointment if you arrive late (15 or more minutes).  Arriving late affects you and other patients whose appointments are after yours.  Also, if you miss three or more appointments without notifying the office, you may be dismissed from the clinic at the provider's discretion.      For prescription refill requests, have your pharmacy contact our office and allow 72 hours for refills to be completed.    Today you received the following procedure: therapeutic phlebotomy.  BELOW ARE SYMPTOMS THAT SHOULD BE REPORTED IMMEDIATELY: *FEVER GREATER THAN 100.4 F (38 C) OR HIGHER *CHILLS OR SWEATING *NAUSEA AND VOMITING THAT IS NOT CONTROLLED WITH YOUR NAUSEA MEDICATION *UNUSUAL SHORTNESS OF BREATH *UNUSUAL BRUISING OR BLEEDING *URINARY PROBLEMS (pain or burning when urinating, or frequent urination) *BOWEL PROBLEMS (unusual diarrhea, constipation, pain near the anus) TENDERNESS IN MOUTH AND THROAT WITH OR WITHOUT PRESENCE OF ULCERS (sore throat, sores in mouth, or a toothache) UNUSUAL RASH, SWELLING OR PAIN  UNUSUAL VAGINAL DISCHARGE OR ITCHING   Items with * indicate a potential emergency and should be followed up as soon as possible or go to the Emergency Department if any problems should occur.  Should you have questions after your visit or need to cancel or reschedule your appointment, please contact [facility]  [phone] and follow the prompts.  Office hours are 8:00 a.m. to 4:30 p.m. Monday - Friday. Please note that voicemails left after 4:00 p.m. may not be returned  until the following business day.  We are closed weekends and major holidays. You have access to a nurse at all times for urgent questions. Please call the main number to the clinic [phone] and follow the prompts.  For any non-urgent questions, you may also contact your provider using MyChart. We now offer e-Visits for anyone 43 and older to request care online for non-urgent symptoms. For details visit [email or web address].   Also download the MyChart app! Go to the app store, search "MyChart", open the app, select [facility], and log in with your MyChart username and password.  Due to Covid, a mask is required upon entering the hospital/clinic. If you do not have a mask, one will be given to you upon arrival. For doctor visits, patients may have 1 support person aged 82 or older with them. For treatment visits, patients cannot have anyone with them due to current Covid guidelines and our immunocompromised population.

## 2020-11-24 NOTE — Progress Notes (Signed)
Proceed with 250 ml phlebotomy today per MD, Dr. Janese Banks, order.

## 2020-12-02 ENCOUNTER — Encounter: Payer: Self-pay | Admitting: Oncology

## 2020-12-02 NOTE — Progress Notes (Signed)
Hematology/Oncology Consult note [facility]  [phone] [phone]  Patient Care Team: Crecencio Mc, MD as PCP - General (Internal Medicine)   Name of the patient: Anthony Allen  [id number]  07-28-1935   Date of visit: 12/02/20  Diagnosis-secondary polycythemia  Chief complaint/ Reason for visit-reestablish care for secondary polycythemia and lost to follow-up  Heme/Onc history: patient is a 85 year old male with a prior history of pontine stroke in 2018.  He has been referred to [place] for evaluation of polycythemia.  Of note patient has been evaluated for polycythemia in the past. Review of his prior CBCs reveals that he has had a high hemoglobin of around 18 even back in 2014.  He did have Jak 2 exon 12 mutation checked in 2018 which was negative.  Most recent CBC from 09/17/2018 showed a white count of 5.5, H&H of 18.4/53.4 and a platelet count of 150.  Patient has been a lifelong non-smoker.  He lives with his wife and does have some difficulty and needs assistance with his ADLs as well due to this stroke.  He is not on any testosterone replacement therapy.  Results of JAK2 mutation and exon 12 mutation done in 2018 negative.  No known chronic lung disease.  Given performance status and age bone marrow biopsy was not attempted.He also had CALR and MPL mutations checked which was negative.  Interval history-patient reports feeling at his baseline presently.  He has not had any recent hospitalizations.  He was lost to follow-up with Dr. Derrel Nip as well and reestablished care with her after a year.no thromboembolic events since 2831  ECOG PS- 2 Pain scale- 0   Review of systems- Review of Systems  Constitutional:  Positive for malaise/fatigue. Negative for chills, fever and weight loss.  HENT:  Negative for congestion, ear discharge and nosebleeds.   Eyes:  Negative for blurred vision.  Respiratory:  Negative for cough, hemoptysis,  sputum production, shortness of breath and wheezing.   Cardiovascular:  Negative for chest pain, palpitations, orthopnea and claudication.  Gastrointestinal:  Negative for abdominal pain, blood in stool, constipation, diarrhea, heartburn, melena, nausea and vomiting.  Genitourinary:  Negative for dysuria, flank pain, frequency, hematuria and urgency.  Musculoskeletal:  Negative for back pain, joint pain and myalgias.  Skin:  Negative for rash.  Neurological:  Negative for dizziness, tingling, focal weakness, seizures, weakness and headaches.  Endo/Heme/Allergies:  Does not bruise/bleed easily.  Psychiatric/Behavioral:  Negative for depression and suicidal ideas. The patient does not have insomnia.     Not on File   Past Medical History:  Diagnosis Date   Arthritis    "mild in my hands" (06/04/2017)   Basal cell carcinoma 01/28/2008   L lat nose    Complication of anesthesia    "was told never to use Propofol because of parent's adverse reaction" (06/04/2017)   CVA (cerebral vascular accident) ([place])    hospitalized from 12/22-12/24 due to left-sided weakness, slurred speech and difficulty swallowing. He was diagnosed as left pontine stroke./notes 06/03/2017   Family history of adverse reaction to anesthesia    "mother and dad had allergic reaction Propofol" (06/04/2017)   Glaucoma, both eyes    Gout    Hypertension    Parotid mass    Stroke (cerebrum) ([place]) 06/03/2017   Type 2 diabetes mellitus with hyperglycemia [facility])      Past Surgical History:  Procedure Laterality Date   TONSILLECTOMY  1943    Social History  Socioeconomic History   Marital status: Married    Spouse name: Not on file   Number of children: Not on file   Years of education: Not on file   Highest education level: Not on file  Occupational History   Not on file  Tobacco Use   Smoking status: Never   Smokeless tobacco: Never  Vaping Use   Vaping Use: Never used  Substance and Sexual Activity    Alcohol use: Yes    Comment: 1 glass of wine per month   Drug use: No   Sexual activity: Not Currently  Other Topics Concern   Not on file  Social History Narrative   Not on file   Social Determinants of Health   Financial Resource Strain: Low Risk    Difficulty of Paying Living Expenses: Not hard at all  Food Insecurity: No Food Insecurity   Worried About Charity fundraiser in the Last Year: Never true   [place] in the Last Year: Never true  Transportation Needs: No Transportation Needs   Lack of Transportation (Medical): No   Lack of Transportation (Non-Medical): No  Physical Activity: Not on file  Stress: No Stress Concern Present   Feeling of Stress : Not at all  Social Connections: Unknown   Frequency of Communication with Friends and Family: More than three times a week   Frequency of Social Gatherings with Friends and Family: Once a week   Attends Religious Services: Not on Electrical engineer or Organizations: Not on file   Attends Archivist Meetings: Not on file   Marital Status: Married  Human resources officer Violence: Not At Risk   Fear of Current or Ex-Partner: No   Emotionally Abused: No   Physically Abused: No   Sexually Abused: No    Family History  Problem Relation Age of Onset   Arthritis Mother    Stroke Father    Hypertension Father    Heart disease Father 72       AMI   Kidney disease Father        bladder ca congenital loss of kidney   Arthritis Maternal Grandmother    Early death Daughter 16       aspiration   Cancer Brother        Scalp   Macular degeneration Brother      Current Outpatient Medications:    acetaminophen (TYLENOL) 500 MG tablet, Take 500 mg by mouth every 6 (six) hours as needed., Disp: , Rfl:    ALPRAZolam (XANAX) 0.5 MG tablet, TAKE 1 TABLET(0.5 MG) BY MOUTH AT BEDTIME AS NEEDED FOR ANXIETY, Disp: 30 tablet, Rfl: 1   Cholecalciferol (VITAMIN D-3) 1000 units CAPS, Take 1,000 Units by mouth  daily., Disp: , Rfl:    clopidogrel (PLAVIX) 75 MG tablet, TAKE 1 TABLET(75 MG) BY MOUTH DAILY, Disp: 90 tablet, Rfl: 1   fluticasone (FLONASE) 50 MCG/ACT nasal spray, Place 2 sprays into both nostrils daily as needed for allergies or rhinitis. , Disp: , Rfl:    HYDROCORTISONE, TOPICAL, 2 % LOTN, Apply 1 application topically as needed (itching)., Disp: , Rfl:    JARDIANCE 10 MG TABS tablet, TAKE 1 TABLET(10 MG) BY MOUTH DAILY, Disp: 90 tablet, Rfl: 1   ketoconazole (NIZORAL) 2 % cream, APPLY EXTERNALLY TO THE AFFECTED AREA AS DIRECTED TO FACE MONDAY, WEDNESDAY, AND FRIDAY. APPLY TO GROIN AREA EVERY DAY, Disp: 60 g, Rfl: 3   ketoconazole (NIZORAL) 2 %  shampoo, Shampoo into the scalp and face. Let sit 5-10 minutes then wash off. Use 3d/wk., Disp: 120 mL, Rfl: 6   latanoprost (XALATAN) 0.005 % ophthalmic solution, Instill 1 drop into both eyes in the evening, Disp: , Rfl: 0   Menthol, Topical Analgesic, (BLUE-EMU MAXIMUM STRENGTH EX), Apply topically., Disp: , Rfl:    mirtazapine (REMERON) 7.5 MG tablet, TAKE 1 TABLET(7.5 MG) BY MOUTH AT BEDTIME, Disp: 90 tablet, Rfl: 1   mometasone (ELOCON) 0.1 % lotion, Apply to affected areas of the scalp QOD PRN., Disp: 60 mL, Rfl: 6   rosuvastatin (CRESTOR) 10 MG tablet, TAKE 1 TABLET(10 MG) BY MOUTH DAILY, Disp: 90 tablet, Rfl: 1   timolol (TIMOPTIC) 0.5 % ophthalmic solution, Instill 1 drop into the right eye in the morning, Disp: , Rfl: 0   clindamycin (CLEOCIN T) 1 % lotion, APPLY EXTERNALLY TO THE AFFECTED AREA EVERY DAY TO TWICE DAILY AS DIRECTED (Patient not taking: Reported on 11/24/2020), Disp: 60 mL, Rfl: 6   gabapentin (NEURONTIN) 100 MG capsule, , Disp: , Rfl:    hydrocortisone 2.5 % lotion, Apply to affected areas of face QOD alternating with Ketoconazole 2% cream. Start applying to the lower legs and ankles QD-BID PRN itch. (Patient not taking: Reported on 11/24/2020), Disp: 118 mL, Rfl: 6   ketoconazole (NIZORAL) 2 % cream, Apply to affected areas of  the face QOD alternating with Hydrocortisone 2.5% cream. Use in the groin/inner thigh QD PRN rash. (Patient not taking: Reported on 11/24/2020), Disp: 60 g, Rfl: 6   NYSTATIN powder, APPLY TOPICALLY TWICE A DAY AS NEEDED FOR RASH (Patient not taking: Reported on 11/24/2020), Disp: 60 g, Rfl: 2  Physical exam:  Vitals:   11/24/20 1435  BP: (!) 151/73  Pulse: 97  Temp: 97.7 F (36.5 C)  SpO2: 97%  Weight: 143 lb (64.9 kg)  Height: _0  (1.753 m)   Physical Exam Constitutional:      General: He is not in acute distress.    Comments: Sitting in a wheelchair.  Appears in no acute distress  Cardiovascular:     Rate and Rhythm: Normal rate and regular rhythm.     Heart sounds: Normal heart sounds.  Pulmonary:     Effort: Pulmonary effort is normal.     Breath sounds: Normal breath sounds.  Abdominal:     General: Bowel sounds are normal.     Palpations: Abdomen is soft.  Skin:    General: Skin is warm and dry.  Neurological:     Mental Status: He is alert and oriented to person, place, and time.     CMP Latest Ref Rng & Units 11/09/2020  Glucose 70 - 99 mg/dL 140(H)  BUN 6 - 23 mg/dL 20  Creatinine 0.40 - 1.50 mg/dL 1.46  Sodium 135 - 145 mEq/L 143  Potassium 3.5 - 5.1 mEq/L 4.1  Chloride 96 - 112 mEq/L 105  CO2 19 - 32 mEq/L 23  Calcium 8.4 - 10.5 mg/dL 9.9  Total Protein 6.0 - 8.3 g/dL 7.0  Total Bilirubin 0.2 - 1.2 mg/dL 1.0  Alkaline Phos 39 - 117 U/L 90  AST 0 - 37 U/L 22  ALT 0 - 53 U/L 24   CBC Latest Ref Rng & Units 11/24/2020  WBC 4.0 - 10.5 K/uL -  Hemoglobin 13.0 - 17.0 g/dL 17.1(H)  Hematocrit 39.0 - 52.0 % 49.1  Platelets 150.0 - 400.0 K/uL -    Assessment and plan- Patient is a 85 y.o. male  with secondary polycythemia of unclear etiology here for routine follow-up  Secondary polycythemia: Work-up for primary myeloproliferative process including JAK2, exon 12, CAL R and MPL mutations were all negative.  Patient's hemoglobin despite not having a phlebotomy  now for 2 years has remained between 17-18.  Presently at 41 with a hematocrit of 52.6 we will plan for phlebotomy today.  Patient finds it difficult to come for periodic phlebotomies.  Plan to perform phlebotomy if hematocrit greater than 50 and patient agreeable to come in the future.  CBC in 3 months in 6 months and he will see covering NP in 6 months each time for possible phlebotomy   Visit Diagnosis 1. Polycythemia, secondary      Dr. Randa Evens, MD, MPH [facility] [phone] 12/02/2020 3:38 PM

## 2021-01-10 ENCOUNTER — Telehealth: Payer: Self-pay | Admitting: Internal Medicine

## 2021-01-10 MED ORDER — ALPRAZOLAM 0.5 MG PO TABS: ORAL_TABLET | ORAL | 5 refills | Status: DC

## 2021-01-10 NOTE — Telephone Encounter (Signed)
Pt needs a refill on ALPRAZolam (XANAX) 0.5 MG tablet sent to [facility]

## 2021-01-10 NOTE — Addendum Note (Signed)
Addended by: Crecencio Mc on: 01/10/2021 05:43 PM   Modules accepted: Orders

## 2021-01-10 NOTE — Telephone Encounter (Signed)
RX Refill:xanax Last Seen:11-09-20 Last ordered:10-11-20

## 2021-01-11 ENCOUNTER — Other Ambulatory Visit: Payer: Self-pay

## 2021-02-03 ENCOUNTER — Other Ambulatory Visit: Payer: Self-pay | Admitting: Internal Medicine

## 2021-02-23 ENCOUNTER — Other Ambulatory Visit: Payer: Self-pay

## 2021-02-23 ENCOUNTER — Inpatient Hospital Stay: Payer: Medicare PPO

## 2021-02-23 ENCOUNTER — Inpatient Hospital Stay: Payer: Medicare PPO | Attending: Oncology

## 2021-02-23 DIAGNOSIS — D751 Secondary polycythemia: Secondary | ICD-10-CM | POA: Diagnosis not present

## 2021-02-23 LAB — CBC WITH DIFFERENTIAL/PLATELET
Abs Immature Granulocytes: 0.03 10*3/uL (ref 0.00–0.07)
Basophils Absolute: 0.1 10*3/uL (ref 0.0–0.1)
Basophils Relative: 1 %
Eosinophils Absolute: 0.3 10*3/uL (ref 0.0–0.5)
Eosinophils Relative: 4 %
HCT: 49.3 % (ref 39.0–52.0)
Hemoglobin: 17.1 g/dL — ABNORMAL HIGH (ref 13.0–17.0)
Immature Granulocytes: 0 %
Lymphocytes Relative: 14 %
Lymphs Abs: 1.2 10*3/uL (ref 0.7–4.0)
MCH: 32.5 pg (ref 26.0–34.0)
MCHC: 34.7 g/dL (ref 30.0–36.0)
MCV: 93.7 fL (ref 80.0–100.0)
Monocytes Absolute: 0.5 10*3/uL (ref 0.1–1.0)
Monocytes Relative: 6 %
Neutro Abs: 6.2 10*3/uL (ref 1.7–7.7)
Neutrophils Relative %: 75 %
Platelets: 152 10*3/uL (ref 150–400)
RBC: 5.26 MIL/uL (ref 4.22–5.81)
RDW: 13.4 % (ref 11.5–15.5)
WBC: 8.2 10*3/uL (ref 4.0–10.5)
nRBC: 0 % (ref 0.0–0.2)

## 2021-02-23 NOTE — Progress Notes (Signed)
Therapeutic phlebotomy performed in left AC using 20g angiocath. 217m removed; pt tolerated well. PO fluids provided. Stable at discharge.

## 2021-03-27 DIAGNOSIS — H353131 Nonexudative age-related macular degeneration, bilateral, early dry stage: Secondary | ICD-10-CM | POA: Diagnosis not present

## 2021-03-27 DIAGNOSIS — Z0100 Encounter for examination of eyes and vision without abnormal findings: Secondary | ICD-10-CM | POA: Diagnosis not present

## 2021-03-27 DIAGNOSIS — E119 Type 2 diabetes mellitus without complications: Secondary | ICD-10-CM | POA: Diagnosis not present

## 2021-03-27 LAB — HM DIABETES EYE EXAM

## 2021-04-25 ENCOUNTER — Other Ambulatory Visit: Payer: Self-pay | Admitting: Internal Medicine

## 2021-05-04 ENCOUNTER — Other Ambulatory Visit: Payer: Self-pay | Admitting: Internal Medicine

## 2021-05-11 ENCOUNTER — Encounter: Payer: Self-pay | Admitting: Internal Medicine

## 2021-05-11 ENCOUNTER — Other Ambulatory Visit: Payer: Self-pay

## 2021-05-11 ENCOUNTER — Ambulatory Visit: Payer: Medicare PPO | Admitting: Internal Medicine

## 2021-05-11 VITALS — BP 114/68 | HR 90 | Temp 95.8°F | Ht 69.0 in | Wt 144.0 lb

## 2021-05-11 DIAGNOSIS — N1831 Chronic kidney disease, stage 3a: Secondary | ICD-10-CM

## 2021-05-11 DIAGNOSIS — I69351 Hemiplegia and hemiparesis following cerebral infarction affecting right dominant side: Secondary | ICD-10-CM

## 2021-05-11 DIAGNOSIS — E1169 Type 2 diabetes mellitus with other specified complication: Secondary | ICD-10-CM | POA: Diagnosis not present

## 2021-05-11 DIAGNOSIS — E785 Hyperlipidemia, unspecified: Secondary | ICD-10-CM

## 2021-05-11 DIAGNOSIS — D751 Secondary polycythemia: Secondary | ICD-10-CM

## 2021-05-11 DIAGNOSIS — R634 Abnormal weight loss: Secondary | ICD-10-CM

## 2021-05-11 DIAGNOSIS — E1122 Type 2 diabetes mellitus with diabetic chronic kidney disease: Secondary | ICD-10-CM | POA: Diagnosis not present

## 2021-05-11 NOTE — Patient Instructions (Addendum)
You are doing really well!  Remember that You need 60 grams of protein daily to prevent  muscle wasting

## 2021-05-11 NOTE — Progress Notes (Signed)
Subjective:  Patient ID: Anthony Allen, male    DOB: [birth year]  Age: 85 y.o. MRN: [id number]  CC: The primary encounter diagnosis was Type 2 diabetes mellitus with diabetic chronic kidney disease, unspecified CKD stage, unspecified whether long term insulin use ([place]). Diagnoses of Polycythemia, secondary, Hemiplegia and hemiparesis following cerebral infarction affecting right dominant side ([place]), Hyperlipidemia associated with type 2 diabetes mellitus ([place]), Weight loss, unintentional, and Stage 3a chronic kidney disease ([place]) were also pertinent to this visit.  HPI Anthony Allen presents for follow up on type 2 DM. Not checking sugars Chief Complaint  Patient presents with   Follow-up    6 month follow up     This visit occurred during the SARS-CoV-2 public health emergency.  Safety protocols were in place, including screening questions prior to the visit, additional usage of staff PPE, and extensive cleaning of exam room while observing appropriate contact time as indicated for disinfecting solutions.    T2DM:  She  feels generally well,  But is not  exercising regularly or trying to lose weight. Checking  blood sugars less than once daily at variable times, usually only if she feels she may be having a hypoglycemic event. .  BS have been under 130 fasting and < 150 post prandially.  Denies any recent hypoglyemic events.  Taking   medications as directed. Following a carbohydrate modified diet 6 days per week. Denies numbness, burning and tingling of extremities. Appetite is good.   Sleeps late , goes to bed late.  Averages 10 to 12 hours of sleep.  No snoring . Wide awake during the day , uses an I pad  for 8 hours daily .    Weight unchanged :  taste of smell still off.  Drinking Atkins  shakes .  Has always been thin.   S/p CVA:  right hand paresis , with limited dexterity  .  Has been getting stronger, able to get up from chair with use of walker . Wife wants him to resume  outpatient PT; he is ambivalent    Polycythemia:  [facility]       Outpatient Medications Prior to Visit  Medication Sig Dispense Refill   acetaminophen (TYLENOL) 500 MG tablet Take 500 mg by mouth every 6 (six) hours as needed.     ALPRAZolam (XANAX) 0.5 MG tablet TAKE 1 TABLET(0.5 MG) BY MOUTH AT BEDTIME AS NEEDED FOR ANXIETY 30 tablet 5   Cholecalciferol (VITAMIN D-3) 1000 units CAPS Take 1,000 Units by mouth daily.     clindamycin (CLEOCIN T) 1 % lotion APPLY EXTERNALLY TO THE AFFECTED AREA EVERY DAY TO TWICE DAILY AS DIRECTED 60 mL 6   clopidogrel (PLAVIX) 75 MG tablet TAKE 1 TABLET(75 MG) BY MOUTH DAILY 90 tablet 1   fluticasone (FLONASE) 50 MCG/ACT nasal spray Place 2 sprays into both nostrils daily as needed for allergies or rhinitis.      hydrocortisone 2.5 % lotion Apply to affected areas of face QOD alternating with Ketoconazole 2% cream. Start applying to the lower legs and ankles QD-BID PRN itch. 118 mL 6   HYDROCORTISONE, TOPICAL, 2 % LOTN Apply 1 application topically as needed (itching).     JARDIANCE 10 MG TABS tablet TAKE 1 TABLET(10 MG) BY MOUTH DAILY 90 tablet 1   ketoconazole (NIZORAL) 2 % cream APPLY EXTERNALLY TO THE AFFECTED AREA AS DIRECTED TO FACE MONDAY, WEDNESDAY, AND FRIDAY. APPLY TO GROIN AREA EVERY DAY  60 g 3   ketoconazole (NIZORAL) 2 % shampoo Shampoo into the scalp and face. Let sit 5-10 minutes then wash off. Use 3d/wk. 120 mL 6   latanoprost (XALATAN) 0.005 % ophthalmic solution Instill 1 drop into both eyes in the evening  0   Menthol, Topical Analgesic, (BLUE-EMU MAXIMUM STRENGTH EX) Apply topically.     mirtazapine (REMERON) 7.5 MG tablet TAKE 1 TABLET(7.5 MG) BY MOUTH AT BEDTIME 90 tablet 1   mometasone (ELOCON) 0.1 % lotion Apply to affected areas of the scalp QOD PRN. 60 mL 6   NYSTATIN powder APPLY TOPICALLY TWICE A DAY AS NEEDED FOR RASH 60 g 2   rosuvastatin (CRESTOR) 10 MG tablet TAKE 1 TABLET(10  MG) BY MOUTH DAILY 90 tablet 1   timolol (TIMOPTIC) 0.5 % ophthalmic solution Instill 1 drop into the right eye in the morning  0   gabapentin (NEURONTIN) 100 MG capsule  (Patient not taking: Reported on 05/11/2021)     ketoconazole (NIZORAL) 2 % cream Apply to affected areas of the face QOD alternating with Hydrocortisone 2.5% cream. Use in the groin/inner thigh QD PRN rash. (Patient not taking: Reported on 05/11/2021) 60 g 6   No facility-administered medications prior to visit.    Review of Systems;  Patient denies headache, fevers, malaise, unintentional weight loss, skin rash, eye pain, sinus congestion and sinus pain, sore throat, dysphagia,  hemoptysis , cough, dyspnea, wheezing, chest pain, palpitations, orthopnea, edema, abdominal pain, nausea, melena, diarrhea, constipation, flank pain, dysuria, hematuria, urinary  Frequency, nocturia, numbness, tingling, seizures,  Focal weakness, Loss of consciousness,  Tremor, insomnia, depression, anxiety, and suicidal ideation.      Objective:  BP 114/68 (BP Location: Left Arm, Patient Position: Sitting, Cuff Size: Normal)   Pulse 90   Temp (!) 95.8 F (35.4 C) (Temporal)   Ht 5\' 9"  (1.753 m)   Wt 144 lb (65.3 kg)   SpO2 98%   BMI 21.27 kg/m   BP Readings from Last 3 Encounters:  05/11/21 114/68  02/23/21 127/87  11/24/20 132/82    Wt Readings from Last 3 Encounters:  05/11/21 144 lb (65.3 kg)  11/24/20 143 lb (64.9 kg)  11/09/20 142 lb 8 oz (64.6 kg)    General appearance: alert, cooperative and appears stated age Ears: normal TM's and external ear canals both ears Throat: lips, mucosa, and tongue normal; teeth and gums normal Neck: no adenopathy, no carotid bruit, supple, symmetrical, trachea midline and thyroid not enlarged, symmetric, no tenderness/mass/nodules Back: symmetric, no curvature. ROM normal. No CVA tenderness. Lungs: clear to auscultation bilaterally Heart: regular rate and rhythm, S1, S2 normal, no murmur,  click, rub or gallop Abdomen: soft, non-tender; bowel sounds normal; no masses,  no organomegaly Pulses: 2+ and symmetric Skin: Skin color, texture, turgor normal. No rashes or lesions Lymph nodes: Cervical, supraclavicular, and axillary nodes normal.  Lab Results  Component Value Date   HGBA1C 6.1 (H) 05/11/2021   HGBA1C 6.2 11/09/2020   HGBA1C 6.1 09/24/2019    Lab Results  Component Value Date   CREATININE 1.49 (H) 05/11/2021   CREATININE 1.46 11/09/2020   CREATININE 1.54 (H) 09/24/2019    Lab Results  Component Value Date   WBC 8.2 02/23/2021   HGB 17.1 (H) 02/23/2021   HCT 49.3 02/23/2021   PLT 152 02/23/2021   GLUCOSE 198 (H) 05/11/2021   CHOL 87 05/11/2021   TRIG 221 (H) 05/11/2021   HDL 33 (L) 05/11/2021   LDLDIRECT 117.0 05/06/2017  LDLCALC 26 05/11/2021   ALT 18 05/11/2021   AST 24 05/11/2021   NA 140 05/11/2021   K 3.9 05/11/2021   CL 105 05/11/2021   CREATININE 1.49 (H) 05/11/2021   BUN 21 05/11/2021   CO2 25 05/11/2021   TSH 1.561 06/01/2017   PSA 1.52 06/23/2013   INR 1.05 06/03/2017   HGBA1C 6.1 (H) 05/11/2021   MICROALBUR 6.0 (H) 11/10/2020    MR FACE/TRIGEMINAL WO/W CM  Result Date: 10/28/2017 CLINICAL DATA:  Left parapharyngeal mass. EXAM: MRI FACE TRIGEMINAL WITHOUT AND WITH CONTRAST TECHNIQUE: Multiplanar, multiecho pulse sequences of the face and surrounding structures, including thin slice imaging of the course of the Trigeminal Nerves, were obtained both before and after administration of intravenous contrast. CONTRAST:  73mL MULTIHANCE GADOBENATE DIMEGLUMINE 529 MG/ML IV SOLN COMPARISON:  Brain MRI 06/01/2017 FINDINGS: There are old infarcts of the right corona radiata and left pons. The visualized intracranial contents are otherwise unremarkable. 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 There is no extension into the skull base.  The lesion measures approximately 4.0 x 0.7 x 2.3 cm. There is no other skull base abnormality. Dedicated trigeminal nerve imaging was also performed. There is no focal abnormality along the trigeminal nerves. Meckel's cave is patent bilaterally. No abnormality at the foramina rotundum or ovale. IMPRESSION: 1. 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 Histologic sampling should be considered. 2. No extension through the skull base. Normal appearance of the visible cranial nerves. 3. 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 Electronically Signed   By: Ulyses Jarred M.D.   On: 10/28/2017 15:44    Assessment & Plan:   Problem List Items Addressed This Visit     CKD (chronic kidney disease), stage III ([place])    Secondary to DM and hypertension.  Avoiding NSAIDs.  Lytes,  PTH all normal.   Lab Results  Component Value Date   PTH 32 11/09/2020   CALCIUM 9.6 05/11/2021   CAION 1.22 06/03/2017   Lab Results  Component Value Date   CREATININE 1.49 (H) 05/11/2021   Lab Results  Component Value Date   NA 140 05/11/2021   K 3.9 05/11/2021   CL 105 05/11/2021   CO2 25 05/11/2021          Hemiplegia and hemiparesis following cerebral infarction affecting right dominant side (HCC)    His right sided weakness has improved and he has become more independent with transfers.  He has slight weakness of right dorsiflexors but no foot drop andis not wearing the brace      Hyperlipidemia associated with type 2 diabetes mellitus ([place])    Historically well-controlled on Jardiance.  hemoglobin A1c is < 6.5.  Patient  is up-to-date on eye exams and foot exam is normal today  .Patient is tolerating statin therapy for CAD risk reduction and  ACE/ARB has been suspended due to hypotension,  He has mild microalbuminuria  .  Lab Results  Component Value Date   MICROALBUR 6.0 (H) 11/10/2020   MICROALBUR 2.2 (H) 09/27/2019     Lab Results  Component Value Date   HGBA1C 6.1 (H) 05/11/2021   Lab Results  Component Value Date  MICROALBUR 6.0 (H) 11/10/2020   No results found for: LIPASE Lab Results  Component Value Date   CHOL 87 05/11/2021   HDL 33 (L) 05/11/2021   LDLCALC 26 05/11/2021   LDLDIRECT 117.0 05/06/2017   TRIG 221 (H) 05/11/2021   CHOLHDL 2.6 05/11/2021     Lab Results  Component Value Date   CHOL 87 05/11/2021   HDL 33 (L) 05/11/2021   LDLCALC 26 05/11/2021   LDLDIRECT 117.0 05/06/2017   TRIG 221 (H) 05/11/2021   CHOLHDL 2.6 05/11/2021   Lab Results  Component Value Date   HGBA1C 6.1 (H) 05/11/2021         Polycythemia, secondary    With history of pontine CVA when hgb was 18.8   Jak 2 mutation screen was negative. He is receiving phlebotomy for hgb > 18  Lab Results  Component Value Date   WBC 8.2 02/23/2021   HGB 17.1 (H) 02/23/2021   HCT 49.3 02/23/2021   MCV 93.7 02/23/2021   PLT 152 02/23/2021         Type II diabetes mellitus with renal manifestations (HCC) - Primary   Relevant Orders   Comprehensive metabolic panel (Completed)   Hemoglobin A1c (Completed)   Lipid panel (Completed)   Weight loss, unintentional    His weight has been stable.  Reviewed protein needs  Samples of protein shakes given         I spent 30 minutes dedicated to the care of this patient on the date of this encounter to include pre-visit review of her medical history,  most recent imaging studies, Face-to-face time with the patient , and post visit ordering of testing and therapeutics.   Medications Discontinued During This Encounter  Medication Reason   ketoconazole (NIZORAL) 2 % cream Duplicate    Follow-up: Return in about 6 months (around 11/09/2021).   Crecencio Mc, MD

## 2021-05-12 LAB — COMPREHENSIVE METABOLIC PANEL
AG Ratio: 1.5 (calc) (ref 1.0–2.5)
ALT: 18 U/L (ref 9–46)
AST: 24 U/L (ref 10–35)
Albumin: 4 g/dL (ref 3.6–5.1)
Alkaline phosphatase (APISO): 80 U/L (ref 35–144)
BUN/Creatinine Ratio: 14 (calc) (ref 6–22)
BUN: 21 mg/dL (ref 7–25)
CO2: 25 mmol/L (ref 20–32)
Calcium: 9.6 mg/dL (ref 8.6–10.3)
Chloride: 105 mmol/L (ref 98–110)
Creat: 1.49 mg/dL — ABNORMAL HIGH (ref 0.70–1.22)
Globulin: 2.6 g/dL (calc) (ref 1.9–3.7)
Glucose, Bld: 198 mg/dL — ABNORMAL HIGH (ref 65–99)
Potassium: 3.9 mmol/L (ref 3.5–5.3)
Sodium: 140 mmol/L (ref 135–146)
Total Bilirubin: 0.8 mg/dL (ref 0.2–1.2)
Total Protein: 6.6 g/dL (ref 6.1–8.1)

## 2021-05-12 LAB — HEMOGLOBIN A1C
Hgb A1c MFr Bld: 6.1 % of total Hgb — ABNORMAL HIGH (ref <5.7)
Mean Plasma Glucose: 128 mg/dL
eAG (mmol/L): 7.1 mmol/L

## 2021-05-12 LAB — LIPID PANEL
Cholesterol: 87 mg/dL (ref <200)
HDL: 33 mg/dL — ABNORMAL LOW (ref >=40)
LDL Cholesterol (Calc): 26 mg/dL (calc)
Non-HDL Cholesterol (Calc): 54 mg/dL (calc) (ref <130)
Total CHOL/HDL Ratio: 2.6 (calc) (ref <5.0)
Triglycerides: 221 mg/dL — ABNORMAL HIGH (ref <150)

## 2021-05-12 NOTE — Assessment & Plan Note (Signed)
With history of pontine CVA when hgb was 18.8   Jak 2 mutation screen was negative. He is receiving phlebotomy for hgb > 18  Lab Results  Component Value Date   WBC 8.2 02/23/2021   HGB 17.1 (H) 02/23/2021   HCT 49.3 02/23/2021   MCV 93.7 02/23/2021   PLT 152 02/23/2021

## 2021-05-12 NOTE — Assessment & Plan Note (Signed)
His right sided weakness has improved and he has become more independent with transfers.  He has slight weakness of right dorsiflexors but no foot drop andis not wearing the brace

## 2021-05-13 NOTE — Assessment & Plan Note (Signed)
His weight has been stable.  Reviewed protein needs  Samples of protein shakes given

## 2021-05-13 NOTE — Assessment & Plan Note (Signed)
Secondary to DM and hypertension.  Avoiding NSAIDs.  Lytes,  PTH all normal.   Lab Results  Component Value Date   PTH 32 11/09/2020   CALCIUM 9.6 05/11/2021   CAION 1.22 06/03/2017   Lab Results  Component Value Date   CREATININE 1.49 (H) 05/11/2021   Lab Results  Component Value Date   NA 140 05/11/2021   K 3.9 05/11/2021   CL 105 05/11/2021   CO2 25 05/11/2021

## 2021-05-13 NOTE — Assessment & Plan Note (Signed)
Historically well-controlled on Jardiance.  hemoglobin A1c is < 6.5.  Patient  is up-to-date on eye exams and foot exam is normal today  .Patient is tolerating statin therapy for CAD risk reduction and ACE/ARB has been suspended due to hypotension,  He has mild microalbuminuria  .  Lab Results  Component Value Date   MICROALBUR 6.0 (H) 11/10/2020   MICROALBUR 2.2 (H) 09/27/2019     Lab Results  Component Value Date   HGBA1C 6.1 (H) 05/11/2021   Lab Results  Component Value Date   MICROALBUR 6.0 (H) 11/10/2020   No results found for: LIPASE Lab Results  Component Value Date   CHOL 87 05/11/2021   HDL 33 (L) 05/11/2021   LDLCALC 26 05/11/2021   LDLDIRECT 117.0 05/06/2017   TRIG 221 (H) 05/11/2021   CHOLHDL 2.6 05/11/2021     Lab Results  Component Value Date   CHOL 87 05/11/2021   HDL 33 (L) 05/11/2021   LDLCALC 26 05/11/2021   LDLDIRECT 117.0 05/06/2017   TRIG 221 (H) 05/11/2021   CHOLHDL 2.6 05/11/2021   Lab Results  Component Value Date   HGBA1C 6.1 (H) 05/11/2021

## 2021-05-24 ENCOUNTER — Telehealth: Payer: Self-pay | Admitting: Internal Medicine

## 2021-05-24 NOTE — Telephone Encounter (Signed)
Requesting lab results from 05/11/2021.

## 2021-05-24 NOTE — Telephone Encounter (Signed)
Spoke with pt's wife and informed her of the patients lab results. She gave a verbal understanding.

## 2021-05-25 ENCOUNTER — Inpatient Hospital Stay: Payer: Medicare PPO | Attending: Nurse Practitioner

## 2021-05-25 ENCOUNTER — Inpatient Hospital Stay (HOSPITAL_BASED_OUTPATIENT_CLINIC_OR_DEPARTMENT_OTHER): Payer: Medicare PPO | Admitting: Nurse Practitioner

## 2021-05-25 ENCOUNTER — Other Ambulatory Visit: Payer: Self-pay

## 2021-05-25 ENCOUNTER — Inpatient Hospital Stay: Payer: Medicare PPO

## 2021-05-25 ENCOUNTER — Encounter: Payer: Self-pay | Admitting: Nurse Practitioner

## 2021-05-25 VITALS — BP 144/96 | HR 93 | Temp 98.7°F | Resp 20 | Wt 144.0 lb

## 2021-05-25 DIAGNOSIS — D751 Secondary polycythemia: Secondary | ICD-10-CM

## 2021-05-25 DIAGNOSIS — Z79899 Other long term (current) drug therapy: Secondary | ICD-10-CM | POA: Diagnosis not present

## 2021-05-25 LAB — CBC WITH DIFFERENTIAL/PLATELET
Abs Immature Granulocytes: 0.02 10*3/uL (ref 0.00–0.07)
Basophils Absolute: 0.1 10*3/uL (ref 0.0–0.1)
Basophils Relative: 1 %
Eosinophils Absolute: 0.2 10*3/uL (ref 0.0–0.5)
Eosinophils Relative: 3 %
HCT: 50.4 % (ref 39.0–52.0)
Hemoglobin: 17.2 g/dL — ABNORMAL HIGH (ref 13.0–17.0)
Immature Granulocytes: 0 %
Lymphocytes Relative: 17 %
Lymphs Abs: 1.1 10*3/uL (ref 0.7–4.0)
MCH: 31.6 pg (ref 26.0–34.0)
MCHC: 34.1 g/dL (ref 30.0–36.0)
MCV: 92.6 fL (ref 80.0–100.0)
Monocytes Absolute: 0.4 10*3/uL (ref 0.1–1.0)
Monocytes Relative: 6 %
Neutro Abs: 4.7 10*3/uL (ref 1.7–7.7)
Neutrophils Relative %: 73 %
Platelets: 165 10*3/uL (ref 150–400)
RBC: 5.44 MIL/uL (ref 4.22–5.81)
RDW: 13.2 % (ref 11.5–15.5)
WBC: 6.4 10*3/uL (ref 4.0–10.5)
nRBC: 0 % (ref 0.0–0.2)

## 2021-05-25 NOTE — Progress Notes (Signed)
Pt had 250 ml blood removed per phlebotomy orders. Tolerated very well. Drank a full glass of water post procedure. Denies any dizziness or weakness post procedure. Discharged to home accompanied by wife.

## 2021-05-25 NOTE — Progress Notes (Signed)
Hematology/Oncology Consult Note [place] at [facility]  [phone] [phone]) [phone]  Patient Care Team: Crecencio Mc, MD as PCP - General (Internal Medicine)   Name of the patient: Anthony Allen  [id number]  10-14-1935   Date of visit: 05/25/21  Diagnosis-secondary polycythemia  Chief complaint/Reason for visit- Secondary polycythemia follow up  Heme/Onc history: patient is a 85 year old male with a prior history of pontine stroke in 2018.  He has been referred to [place] for evaluation of polycythemia.  Of note patient has been evaluated for polycythemia in the past. Review of his prior CBCs reveals that he has had a high hemoglobin of around 18 even back in 2014.  He did have Jak 2 exon 12 mutation checked in 2018 which was negative.  Most recent CBC from 09/17/2018 showed a white count of 5.5, H&H of 18.4/53.4 and a platelet count of 150.  Patient has been a lifelong non-smoker.  He lives with his wife and does have some difficulty and needs assistance with his ADLs as well due to this stroke.  He is not on any testosterone replacement therapy.  Results of JAK2 mutation and exon 12 mutation done in 2018 negative.  No known chronic lung disease.  Given performance status and age bone marrow biopsy was not attempted.He also had CALR and MPL mutations checked which was negative.  Interval history- Patient is 85 year old male who returns to clinic for labs and follow up for polycythemia. Continues to feel at baseline and denies complaints. No thromboembolic events since 6168.   ECOG PS- 2 Pain scale- 0  Review of systems- Review of Systems  Constitutional:  Negative for chills, fever, malaise/fatigue and weight loss.  HENT:  Negative for congestion, ear discharge and nosebleeds.   Eyes:  Negative for blurred vision.  Respiratory:  Negative for cough, hemoptysis, sputum production, shortness of breath and wheezing.   Cardiovascular:  Negative for  chest pain, palpitations, orthopnea and claudication.  Gastrointestinal:  Negative for abdominal pain, blood in stool, constipation, diarrhea, heartburn, melena, nausea and vomiting.  Genitourinary:  Negative for dysuria, flank pain, frequency, hematuria and urgency.  Musculoskeletal:  Negative for back pain, joint pain and myalgias.  Skin:  Negative for rash.  Neurological:  Negative for dizziness, tingling, focal weakness, seizures, weakness and headaches.  Endo/Heme/Allergies:  Does not bruise/bleed easily.  Psychiatric/Behavioral:  Negative for depression and suicidal ideas. The patient does not have insomnia.     Not on File   Past Medical History:  Diagnosis Date   Arthritis    "mild in my hands" (06/04/2017)   Basal cell carcinoma 01/28/2008   L lat nose    Complication of anesthesia    "was told never to use Propofol because of parent's adverse reaction" (06/04/2017)   CVA (cerebral vascular accident) ([place])    hospitalized from 12/22-12/24 due to left-sided weakness, slurred speech and difficulty swallowing. He was diagnosed as left pontine stroke./notes 06/03/2017   Family history of adverse reaction to anesthesia    "mother and dad had allergic reaction Propofol" (06/04/2017)   Glaucoma, both eyes    Gout    Hypertension    Parotid mass    Stroke (cerebrum) ([place]) 06/03/2017   Type 2 diabetes mellitus with hyperglycemia ([place])      Past Surgical History:  Procedure Laterality Date   TONSILLECTOMY  1943    Social History   Socioeconomic History   Marital status: Married    Spouse name: Not  on file   Number of children: Not on file   Years of education: Not on file   Highest education level: Not on file  Occupational History   Not on file  Tobacco Use   Smoking status: Never   Smokeless tobacco: Never  Vaping Use   Vaping Use: Never used  Substance and Sexual Activity   Alcohol use: Yes    Comment: 1 glass of wine per month   Drug use: No   Sexual  activity: Not Currently  Other Topics Concern   Not on file  Social History Narrative   Not on file   Social Determinants of Health   Financial Resource Strain: Low Risk    Difficulty of Paying Living Expenses: Not hard at all  Food Insecurity: No Food Insecurity   Worried About Charity fundraiser in the Last Year: Never true   [place] in the Last Year: Never true  Transportation Needs: No Transportation Needs   Lack of Transportation (Medical): No   Lack of Transportation (Non-Medical): No  Physical Activity: Not on file  Stress: No Stress Concern Present   Feeling of Stress : Not at all  Social Connections: Unknown   Frequency of Communication with Friends and Family: More than three times a week   Frequency of Social Gatherings with Friends and Family: Once a week   Attends Religious Services: Not on Electrical engineer or Organizations: Not on file   Attends Archivist Meetings: Not on file   Marital Status: Married  Human resources officer Violence: Not At Risk   Fear of Current or Ex-Partner: No   Emotionally Abused: No   Physically Abused: No   Sexually Abused: No    Family History  Problem Relation Age of Onset   Arthritis Mother    Stroke Father    Hypertension Father    Heart disease Father 41       AMI   Kidney disease Father        bladder ca congenital loss of kidney   Arthritis Maternal Grandmother    Early death Daughter 16       aspiration   Cancer Brother        Scalp   Macular degeneration Brother      Current Outpatient Medications:    acetaminophen (TYLENOL) 500 MG tablet, Take 500 mg by mouth every 6 (six) hours as needed., Disp: , Rfl:    ALPRAZolam (XANAX) 0.5 MG tablet, TAKE 1 TABLET(0.5 MG) BY MOUTH AT BEDTIME AS NEEDED FOR ANXIETY, Disp: 30 tablet, Rfl: 5   Cholecalciferol (VITAMIN D-3) 1000 units CAPS, Take 1,000 Units by mouth daily., Disp: , Rfl:    clindamycin (CLEOCIN T) 1 % lotion, APPLY EXTERNALLY TO THE  AFFECTED AREA EVERY DAY TO TWICE DAILY AS DIRECTED, Disp: 60 mL, Rfl: 6   clopidogrel (PLAVIX) 75 MG tablet, TAKE 1 TABLET(75 MG) BY MOUTH DAILY, Disp: 90 tablet, Rfl: 1   fluticasone (FLONASE) 50 MCG/ACT nasal spray, Place 2 sprays into both nostrils daily as needed for allergies or rhinitis. , Disp: , Rfl:    gabapentin (NEURONTIN) 100 MG capsule, , Disp: , Rfl:    hydrocortisone 2.5 % lotion, Apply to affected areas of face QOD alternating with Ketoconazole 2% cream. Start applying to the lower legs and ankles QD-BID PRN itch., Disp: 118 mL, Rfl: 6   HYDROCORTISONE, TOPICAL, 2 % LOTN, Apply 1 application topically as needed (itching)., Disp: ,  Rfl:    JARDIANCE 10 MG TABS tablet, TAKE 1 TABLET(10 MG) BY MOUTH DAILY, Disp: 90 tablet, Rfl: 1   ketoconazole (NIZORAL) 2 % cream, APPLY EXTERNALLY TO THE AFFECTED AREA AS DIRECTED TO FACE MONDAY, WEDNESDAY, AND FRIDAY. APPLY TO GROIN AREA EVERY DAY, Disp: 60 g, Rfl: 3   ketoconazole (NIZORAL) 2 % shampoo, Shampoo into the scalp and face. Let sit 5-10 minutes then wash off. Use 3d/wk., Disp: 120 mL, Rfl: 6   latanoprost (XALATAN) 0.005 % ophthalmic solution, Instill 1 drop into both eyes in the evening, Disp: , Rfl: 0   Menthol, Topical Analgesic, (BLUE-EMU MAXIMUM STRENGTH EX), Apply topically., Disp: , Rfl:    mirtazapine (REMERON) 7.5 MG tablet, TAKE 1 TABLET(7.5 MG) BY MOUTH AT BEDTIME, Disp: 90 tablet, Rfl: 1   mometasone (ELOCON) 0.1 % lotion, Apply to affected areas of the scalp QOD PRN., Disp: 60 mL, Rfl: 6   NYSTATIN powder, APPLY TOPICALLY TWICE A DAY AS NEEDED FOR RASH, Disp: 60 g, Rfl: 2   rosuvastatin (CRESTOR) 10 MG tablet, TAKE 1 TABLET(10 MG) BY MOUTH DAILY, Disp: 90 tablet, Rfl: 1   timolol (TIMOPTIC) 0.5 % ophthalmic solution, Instill 1 drop into the right eye in the morning, Disp: , Rfl: 0  Physical exam:  Vitals:   05/25/21 1306  BP: (!) 144/96  Pulse: 93  Resp: 20  Temp: 98.7 F (37.1 C)  SpO2: 100%  Weight: 144 lb (65.3 kg)    Physical Exam Constitutional:      General: He is not in acute distress. Cardiovascular:     Rate and Rhythm: Normal rate and regular rhythm.     Heart sounds: Normal heart sounds.  Pulmonary:     Effort: Pulmonary effort is normal.     Breath sounds: Normal breath sounds.  Abdominal:     General: Bowel sounds are normal.     Palpations: Abdomen is soft.  Skin:    General: Skin is warm and dry.  Neurological:     Mental Status: He is alert and oriented to person, place, and time.  Psychiatric:        Mood and Affect: Mood normal.        Behavior: Behavior normal.     CMP Latest Ref Rng & Units 05/11/2021  Glucose 65 - 99 mg/dL 198(H)  BUN 7 - 25 mg/dL 21  Creatinine 0.70 - 1.22 mg/dL 1.49(H)  Sodium 135 - 146 mmol/L 140  Potassium 3.5 - 5.3 mmol/L 3.9  Chloride 98 - 110 mmol/L 105  CO2 20 - 32 mmol/L 25  Calcium 8.6 - 10.3 mg/dL 9.6  Total Protein 6.1 - 8.1 g/dL 6.6  Total Bilirubin 0.2 - 1.2 mg/dL 0.8  Alkaline Phos 39 - 117 U/L -  AST 10 - 35 U/L 24  ALT 9 - 46 U/L 18   CBC Latest Ref Rng & Units 05/25/2021  WBC 4.0 - 10.5 K/uL 6.4  Hemoglobin 13.0 - 17.0 g/dL 17.2(H)  Hematocrit 39.0 - 52.0 % 50.4  Platelets 150 - 400 K/uL 165    Assessment and plan- Patient is a 85 y.o. male with secondary polycythemia of unclear etiology here for routine follow-up  Secondary polycythemia: Work-up for primary myeloproliferative process including JAK2, exon 12, CAL R and MPL mutations were all negative. He was lost to follow up and hemoglobin ranged between 17-18. Today, hematocrit 50.4. Proceed with phlebotomy today. Plan for phlebotomy if hematocrit > 50.   Phlebotomy today 3 mo- lab +/- phlebotomy  6 mo- lab, Janese Banks, +/- phlebotomy- la    Visit Diagnosis No diagnosis found.  Beckey Rutter, DNP, AGNP-C [place] at [facility] [phone] (clinic) 05/25/2021

## 2021-05-25 NOTE — Patient Instructions (Signed)
Therapeutic Phlebotomy °Therapeutic phlebotomy is the planned removal of blood from a person's body for the purpose of treating a medical condition. The procedure is lot like donating blood. Usually, about a pint (470 mL, or 0.47 L) of blood is removed. The average adult has 9-12 pints (4.3-5.7 L) of blood in his or her body. °Therapeutic phlebotomy may be used to treat the following medical conditions: °Hemochromatosis. This is a condition in which the blood contains too much iron. °Polycythemia vera. This is a condition in which the blood contains too many red blood cells. °Porphyria cutanea tarda. This is a disease in which an important part of hemoglobin is not made properly. It results in the buildup of abnormal amounts of porphyrins in the body. °Sickle cell disease. This is a condition in which the red blood cells form an abnormal crescent shape rather than a round shape. °Tell a health care provider about: °Any allergies you have. °All medicines you are taking, including vitamins, herbs, eye drops, creams, and over-the-counter medicines. °Any bleeding problems you have. °Any surgeries you have had. °Any medical conditions you have. °Whether you are pregnant or may be pregnant. °What are the risks? °Generally, this is a safe procedure. However, problems may occur, including: °Nausea or light-headedness. °Low blood pressure (hypotension). °Soreness, bleeding, swelling, or bruising at the needle insertion site. °Infection. °What happens before the procedure? °Ask your health care provider about: °Changing or stopping your regular medicines. This is especially important if you are taking diabetes medicines or blood thinners. °Taking medicines such as aspirin and ibuprofen. These medicines can thin your blood. Do not take these medicines unless your health care provider tells you to take them. °Taking over-the-counter medicines, vitamins, herbs, and supplements. °Wear clothing with sleeves that can be raised  above the elbow. °You may have a blood sample taken. °Your blood pressure, pulse rate, and breathing rate will be measured. °What happens during the procedure? ° °You may be given a medicine to numb the area (local anesthetic). °A tourniquet will be placed on your arm. °A needle will be put into one of your veins. °Tubing and a collection bag will be attached to the needle. °Blood will flow through the needle and tubing into the collection bag. °The collection bag will be placed lower than your arm so gravity can help the blood flow into the bag. °You may be asked to open and close your hand slowly and continually during the entire collection. °After the specified amount of blood has been removed from your body, the collection bag and tubing will be clamped. °The needle will be removed from your vein. °Pressure will be held on the needle site to stop the bleeding. °A bandage (dressing) will be placed over the needle insertion site. °The procedure may vary among health care providers and hospitals. °What happens after the procedure? °Your blood pressure, pulse rate, and breathing rate will be measured after the procedure. °You will be encouraged to drink fluids. °You will be encouraged to eat a snack to prevent a low blood sugar level. °Your recovery will be assessed and monitored. °Return to your normal activities as told by your health care provider. °Summary °Therapeutic phlebotomy is the planned removal of blood from a person's body for the purpose of treating a medical condition. °Therapeutic phlebotomy may be used to treat hemochromatosis, polycythemia vera, porphyria cutanea tarda, or sickle cell disease. °In the procedure, a needle is inserted and about a pint (470 mL, or 0.47 L) of blood is   removed. The average adult has 9-12 pints (4.3-5.7 L) of blood in the body. °This is generally a safe procedure, but it can sometimes cause problems such as nausea, light-headedness, or low blood pressure  (hypotension). °This information is not intended to replace advice given to you by your health care provider. Make sure you discuss any questions you have with your health care provider. °Document Revised: 11/22/2020 Document Reviewed: 11/22/2020 °Elsevier Patient Education © 2022 Elsevier Inc. ° °

## 2021-05-28 ENCOUNTER — Encounter: Payer: Self-pay | Admitting: Oncology

## 2021-06-27 ENCOUNTER — Other Ambulatory Visit: Payer: Self-pay

## 2021-06-27 MED ORDER — CLINDAMYCIN PHOSPHATE 1 % EX LOTN: TOPICAL_LOTION | CUTANEOUS | 0 refills | Status: DC

## 2021-06-27 NOTE — Progress Notes (Signed)
1 RF due to request left on nurse VM. aw

## 2021-07-09 ENCOUNTER — Other Ambulatory Visit: Payer: Self-pay | Admitting: Internal Medicine

## 2021-07-10 NOTE — Telephone Encounter (Signed)
Patients wife called in regards to patient medication refill. Patient is needing a refill for alprazolam sent to [facility] on [address] in [place].

## 2021-07-24 ENCOUNTER — Other Ambulatory Visit: Payer: Self-pay | Admitting: Internal Medicine

## 2021-07-25 ENCOUNTER — Other Ambulatory Visit: Payer: Self-pay

## 2021-07-25 ENCOUNTER — Ambulatory Visit: Payer: Medicare PPO | Admitting: Dermatology

## 2021-07-25 DIAGNOSIS — L739 Follicular disorder, unspecified: Secondary | ICD-10-CM | POA: Diagnosis not present

## 2021-07-25 DIAGNOSIS — L2089 Other atopic dermatitis: Secondary | ICD-10-CM

## 2021-07-25 DIAGNOSIS — L570 Actinic keratosis: Secondary | ICD-10-CM

## 2021-07-25 DIAGNOSIS — L820 Inflamed seborrheic keratosis: Secondary | ICD-10-CM

## 2021-07-25 DIAGNOSIS — L814 Other melanin hyperpigmentation: Secondary | ICD-10-CM

## 2021-07-25 DIAGNOSIS — L720 Epidermal cyst: Secondary | ICD-10-CM

## 2021-07-25 DIAGNOSIS — Z85828 Personal history of other malignant neoplasm of skin: Secondary | ICD-10-CM

## 2021-07-25 DIAGNOSIS — L219 Seborrheic dermatitis, unspecified: Secondary | ICD-10-CM | POA: Diagnosis not present

## 2021-07-25 DIAGNOSIS — L821 Other seborrheic keratosis: Secondary | ICD-10-CM

## 2021-07-25 DIAGNOSIS — Z1283 Encounter for screening for malignant neoplasm of skin: Secondary | ICD-10-CM | POA: Diagnosis not present

## 2021-07-25 DIAGNOSIS — L309 Dermatitis, unspecified: Secondary | ICD-10-CM

## 2021-07-25 DIAGNOSIS — D1800 Hemangioma unspecified site: Secondary | ICD-10-CM

## 2021-07-25 DIAGNOSIS — D170 Benign lipomatous neoplasm of skin and subcutaneous tissue of head, face and neck: Secondary | ICD-10-CM

## 2021-07-25 DIAGNOSIS — D229 Melanocytic nevi, unspecified: Secondary | ICD-10-CM

## 2021-07-25 DIAGNOSIS — L578 Other skin changes due to chronic exposure to nonionizing radiation: Secondary | ICD-10-CM | POA: Diagnosis not present

## 2021-07-25 DIAGNOSIS — Z8673 Personal history of transient ischemic attack (TIA), and cerebral infarction without residual deficits: Secondary | ICD-10-CM

## 2021-07-25 MED ORDER — CLINDAMYCIN PHOSPHATE 1 % EX LOTN: TOPICAL_LOTION | Freq: Every day | CUTANEOUS | 6 refills | Status: DC

## 2021-07-25 MED ORDER — KETOCONAZOLE 2 % EX SHAM: MEDICATED_SHAMPOO | CUTANEOUS | 6 refills | Status: DC

## 2021-07-25 MED ORDER — MOMETASONE FUROATE 0.1 % EX SOLN: CUTANEOUS | 6 refills | Status: DC

## 2021-07-25 MED ORDER — HYDROCORTISONE 2.5 % EX LOTN: TOPICAL_LOTION | CUTANEOUS | 6 refills | Status: DC

## 2021-07-25 NOTE — Progress Notes (Deleted)
° °  Follow-Up Visit   Subjective  Anthony Allen is a 86 y.o. male who presents for the following: Total body skin exam (Hx of BCC L lat nose), Seborrheic Dermatitis (Face, scalp, Ketoconazole 2% shampoo 3x.wk, Mometasone sol qod prn aa scalp, Ketoconazole 2% cr qod to face, HC 2.5% lotion qod to face), and Eczema (L ankle, was using Ketoconazole cr not HC 2.5% lotion).  ***  The following portions of the chart were reviewed this encounter and updated as appropriate:       Review of Systems:  No other skin or systemic complaints except as noted in HPI or Assessment and Plan.  Objective  Well appearing patient in no apparent distress; mood and affect are within normal limits.  {[id number]::"O full examination was performed including scalp, head, eyes, ears, nose, lips, neck, chest, axillae, abdomen, back, buttocks, bilateral upper extremities, bilateral lower extremities, hands, feet, fingers, toes, fingernails, and toenails. All findings within normal limits unless otherwise noted below."}    Assessment & Plan   No follow-ups on file.

## 2021-07-25 NOTE — Patient Instructions (Addendum)
For Scalp and face Continue Mometasone solution to affected areas of scalp every other day or 4 days a week as needed.  Continue Ketoconazole 2% cream every other day (alternating with Hydrocortisone 2.5% cream) Continue Hydrocortisone 2.5% cream every other day (alternating with Ketoconazole 2% cream)     Cont Ketoconazole 2% shampoo let sit on scalp 5-10 minutes before washing off. Use 3 times weekly.  Eczema on left ankle Start the Hydrocortisone lotion to left ankle daily up to 5 days a week until clear.   If You Need Anything After Your Visit  If you have any questions or concerns for your doctor, please call our main line at [phone] and press option 4 to reach your doctor's medical assistant. If no one answers, please leave a voicemail as directed and we will return your call as soon as possible. Messages left after 4 pm will be answered the following business day.   You may also send [place] a message via [place]. We typically respond to MyChart messages within 1-2 business days.  For prescription refills, please ask your pharmacy to contact our office. Our fax number is [phone].  If you have an urgent issue when the clinic is closed that cannot wait until the next business day, you can page your doctor at the number below.    Please note that while we do our best to be available for urgent issues outside of office hours, we are not available 24/7.   If you have an urgent issue and are unable to reach [place], you may choose to seek medical care at your doctor's office, retail clinic, urgent care center, or emergency room.  If you have a medical emergency, please immediately call 911 or go to the emergency department.  Pager Numbers  - Dr. Nehemiah Massed: [phone]  - Dr. Laurence Ferrari: [phone]  - Dr. Nicole Kindred: [phone]  In the event of inclement weather, please call our main line at [phone] for an update on the status of any delays or closures.  Dermatology Medication  Tips: Please keep the boxes that topical medications come in in order to help keep track of the instructions about where and how to use these. Pharmacies typically print the medication instructions only on the boxes and not directly on the medication tubes.   If your medication is too expensive, please contact our office at [phone] option 4 or send [place] a message through [place].   We are unable to tell what your co-pay for medications will be in advance as this is different depending on your insurance coverage. However, we may be able to find a substitute medication at lower cost or fill out paperwork to get insurance to cover a needed medication.   If a prior authorization is required to get your medication covered by your insurance company, please allow [place] 1-2 business days to complete this process.  Drug prices often vary depending on where the prescription is filled and some pharmacies may offer cheaper prices.  The website www.goodrx.com contains coupons for medications through different pharmacies. The prices here do not account for what the cost may be with help from insurance (it may be cheaper with your insurance), but the website can give you the price if you did not use any insurance.  - You can print the associated coupon and take it with your prescription to the pharmacy.  - You may also stop by our office during regular business hours and pick up a GoodRx coupon card.  - If you  need your prescription sent electronically to a different pharmacy, notify our office through [facility] or by phone at [phone] option 4.     Si Usted Necesita Algo Despus de Su Visita  Tambin puede enviarnos un mensaje a travs de Pharmacist, community. Por lo general respondemos a los mensajes de MyChart en el transcurso de 1 a 2 das hbiles.  Para renovar recetas, por favor pida a su farmacia que se ponga en contacto con nuestra oficina. Harland Dingwall de fax es [place] [phone].  Si tiene un  asunto urgente cuando la clnica est cerrada y que no puede esperar hasta el siguiente da hbil, puede llamar/localizar a su doctor(a) al nmero que aparece a continuacin.   Por favor, tenga en cuenta que aunque hacemos todo lo posible para estar disponibles para asuntos urgentes fuera del horario de [place], no estamos disponibles las 24 horas del da, los 7 das de la [place].   Si tiene un problema urgente y no puede comunicarse con nosotros, puede optar por buscar atencin mdica  en el consultorio de su doctor(a), en una clnica privada, en un centro de atencin urgente o en una sala de emergencias.  Si tiene Engineering geologist, por favor llame inmediatamente al 911 o vaya a la sala de emergencias.  Nmeros de bper  - Dr. Nehemiah Massed: [phone]  - Dra. Moye: [phone]  - Dra. Nicole Kindred: [phone]  En caso de inclemencias del [place], por favor llame a Johnsie Kindred principal al [phone] para una actualizacin sobre el [place] de cualquier retraso o cierre.  Consejos para la medicacin en dermatologa: Por favor, guarde las cajas en las que vienen los medicamentos de uso tpico para ayudarle a seguir las instrucciones sobre dnde y cmo usarlos. Las farmacias generalmente imprimen las instrucciones del medicamento slo en las cajas y no directamente en los tubos del [place].   Si su medicamento es muy caro, por favor, pngase en contacto con Zigmund Daniel llamando al [phone] y presione la opcin 4 o envenos un mensaje a travs de Pharmacist, community.   No podemos decirle cul ser su copago por los medicamentos por adelantado ya que esto es diferente dependiendo de la cobertura de su seguro. Sin embargo, es posible que podamos encontrar un medicamento sustituto a Electrical engineer un formulario para que el seguro cubra el medicamento que se considera necesario.   Si se requiere una autorizacin previa para que su compaa de seguros [place] su medicamento, por favor  permtanos de 1 a 2 das hbiles para completar este proceso.  Los precios de los medicamentos varan con frecuencia dependiendo del Environmental consultant de dnde se surte la receta y alguna farmacias pueden ofrecer precios ms baratos.  El sitio web www.goodrx.com tiene cupones para medicamentos de Airline pilot. Los precios aqu no tienen en cuenta lo que podra costar con la ayuda del seguro (puede ser ms barato con su seguro), pero el sitio web puede darle el precio si no utiliz Research scientist (physical sciences).  - Puede imprimir el cupn correspondiente y llevarlo con su receta a la farmacia.  - Tambin puede pasar por nuestra oficina durante el horario de atencin regular y Charity fundraiser una tarjeta de cupones de GoodRx.  - Si necesita que su receta se enve electrnicamente a una farmacia diferente, informe a nuestra oficina a travs de MyChart de [place] o por telfono llamando al [phone] y presione la opcin 4.

## 2021-07-25 NOTE — Progress Notes (Signed)
Follow-Up Visit   Subjective  Anthony Allen is a 86 y.o. male who presents for the following: Total body skin exam (Hx of BCC L lat nose), Seborrheic Dermatitis (Face, scalp, Ketoconazole 2% shampoo 3x.wk, Mometasone sol qod prn aa scalp, Ketoconazole 2% cr qod to face, HC 2.5% lotion qod to face), Eczema (L ankle, was using Ketoconazole cr not HC 2.5% lotion), and folliculitis (Back, clindamycin lotion qd). The patient presents for Total-Body Skin Exam (TBSE) for skin cancer screening and mole check.  The patient has spots, moles and lesions to be evaluated, some may be new or changing and the patient has concerns that these could be cancer.   Patient accompanied by wife who contributes to history.  The following portions of the chart were reviewed this encounter and updated as appropriate:   Tobacco   Allergies   Meds   Problems   Med Hx   Surg Hx   Fam Hx      Review of Systems:  No other skin or systemic complaints except as noted in HPI or Assessment and Plan.  Objective  Well appearing patient in no apparent distress; mood and affect are within normal limits.  A full examination was performed including scalp, head, eyes, ears, nose, lips, neck, chest, axillae, abdomen, back, buttocks, bilateral upper extremities, bilateral lower extremities, hands, feet, fingers, toes, fingernails, and toenails. All findings within normal limits unless otherwise noted below.  face, scalp Mild scaling face, scalp  L lat nose Well healed scar with no evidence of recurrence.   neck, shoulders Paps neck, shoulders  Left Ear x 1, L temple x 1, R temple x 1 (3) Pink scaly macules  Scalp x 1 Stuck on waxy paps with erythema  R deltoid Cystic pap  Head - Anterior (Face) Rubbery nodule  Left Ankle - Anterior Pink scaly patchy   Assessment & Plan   Lentigines - Scattered tan macules - Due to sun exposure - Benign-appearing, observe - Recommend daily broad spectrum sunscreen SPF  30+ to sun-exposed areas, reapply every 2 hours as needed. - Call for any changes  Seborrheic Keratoses - Stuck-on, waxy, tan-brown papules and/or plaques  - Benign-appearing - Discussed benign etiology and prognosis. - Observe - Call for any changes  Melanocytic Nevi - Tan-brown and/or pink-flesh-colored symmetric macules and papules - Benign appearing on exam today - Observation - Call clinic for new or changing moles - Recommend daily use of broad spectrum spf 30+ sunscreen to sun-exposed areas.   Hemangiomas - Red papules - Discussed benign nature - Observe - Call for any changes  Actinic Damage - Chronic condition, secondary to cumulative UV/sun exposure - diffuse scaly erythematous macules with underlying dyspigmentation - Recommend daily broad spectrum sunscreen SPF 30+ to sun-exposed areas, reapply every 2 hours as needed.  - Staying in the shade or wearing long sleeves, sun glasses (UVA+UVB protection) and wide brim hats (4-inch brim around the entire circumference of the hat) are also recommended for sun protection.  - Call for new or changing lesions.  Skin cancer screening performed today.  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  It tends to be exacerbated by stress and cooler weather.  People who have neurologic disease may experience new onset or exacerbation of existing seborrheic dermatitis.  The condition is not curable but treatable and can be controlled.  Continue Mometasone solution to aa's scalp QOD or 4d/wk PRN.  Continue Ketoconazole 2% cream QOD alternating with HC 2.5% cream QOD.  Cont Ketoconazole 2% shampoo let sit 5-10 minutes before washing off. Use 3d/wk.   Related Medications mometasone (ELOCON) 0.1 % lotion Apply to  affected areas of the scalp QOD ketoconazole (NIZORAL) 2 % shampoo Shampoo into the scalp and face. Let sit 5-10 minutes then wash off. Use 3d/wk. hydrocortisone 2.5 % lotion Apply to affected areas of face QOD alternating with Ketoconazole 2% cream.  Also may use on eczema on left lower leg daily up to 5 days a week until clear  History of basal cell carcinoma (BCC) L lat nose Clear. Observe for recurrence. Call clinic for new or changing lesions.  Recommend regular skin exams, daily broad-spectrum spf 30+ sunscreen use, and photoprotection.    Folliculitis neck, shoulders Cont Clindamycin lotion qd to back, let sit 5 minutes and rinse off Chronic and persistent condition with duration or expected duration over one year. Condition is symptomatic/ bothersome to patient. Not currently at goal. clindamycin (CLEOCIN-T) 1 % lotion - neck, shoulders Apply topically daily. Apply to neck and shoulders daily  AK (actinic keratosis) Left Ear x 1, L temple x 1, R temple x 1 Destruction of lesion - Left Ear x 1, L temple x 1, R temple x 1 Complexity: simple   Destruction method: cryotherapy   Informed consent: discussed and consent obtained   Timeout:  patient name, date of birth, surgical site, and procedure verified Lesion destroyed using liquid nitrogen: Yes   Region frozen until ice ball extended beyond lesion: Yes   Outcome: patient tolerated procedure well with no complications   Post-procedure details: wound care instructions given    Inflamed seborrheic keratosis Scalp x 1 Destruction of lesion - Scalp x 1 Complexity: simple   Destruction method: cryotherapy   Informed consent: discussed and consent obtained   Timeout:  patient name, date of birth, surgical site, and procedure verified Lesion destroyed using liquid nitrogen: Yes   Region frozen until ice ball extended beyond lesion: Yes   Outcome: patient tolerated procedure well with no complications   Post-procedure details:  wound care instructions given    Epidermal cyst R deltoid Benign, observe.    Lipoma of head Head - Anterior (Face) Benign, observe.    Eczema, atopic dermatitis Left Ankle - Anterior Atopic dermatitis (eczema) is a chronic, relapsing, pruritic condition that can significantly affect quality of life. It is often associated with allergic rhinitis and/or asthma and can require treatment with topical medications, phototherapy, or in severe cases biologic injectable medication (Dupixent; Adbry) or Oral JAK inhibitors.   Start HC 2.5% lotion qd  Return in about 1 year (around 07/25/2022) for TBSE, Hx of BCC, Seb derm f/u, Eczema f/u, Folliculitis f/u.  I, Othelia Pulling, RMA, am acting as scribe for Sarina Ser, MD . Documentation: I have reviewed the above documentation for accuracy and completeness, and I agree with the above.  Sarina Ser, MD

## 2021-07-28 ENCOUNTER — Encounter: Payer: Self-pay | Admitting: Dermatology

## 2021-08-14 ENCOUNTER — Other Ambulatory Visit: Payer: Self-pay | Admitting: *Deleted

## 2021-08-14 DIAGNOSIS — D751 Secondary polycythemia: Secondary | ICD-10-CM

## 2021-08-16 ENCOUNTER — Other Ambulatory Visit: Payer: Self-pay | Admitting: Internal Medicine

## 2021-08-23 ENCOUNTER — Other Ambulatory Visit: Payer: Self-pay

## 2021-08-23 ENCOUNTER — Telehealth: Payer: Self-pay | Admitting: *Deleted

## 2021-08-23 MED ORDER — ROSUVASTATIN CALCIUM 10 MG PO TABS: ORAL_TABLET | ORAL | 3 refills | Status: DC

## 2021-08-23 NOTE — Telephone Encounter (Signed)
Patient's wife is unable to bring patient at the scheduled time on 3/17 for lab/phlebotomy. She requested that the apt be changed. New apt time given for 3/24. Apts changed in system - wife is aware of new apt time. ?

## 2021-08-24 ENCOUNTER — Inpatient Hospital Stay: Payer: Medicare PPO

## 2021-08-31 ENCOUNTER — Inpatient Hospital Stay: Payer: Medicare PPO | Attending: Oncology

## 2021-08-31 ENCOUNTER — Inpatient Hospital Stay: Payer: Medicare PPO

## 2021-08-31 ENCOUNTER — Other Ambulatory Visit: Payer: Self-pay

## 2021-08-31 DIAGNOSIS — D751 Secondary polycythemia: Secondary | ICD-10-CM | POA: Diagnosis not present

## 2021-08-31 LAB — CBC WITH DIFFERENTIAL/PLATELET
Abs Immature Granulocytes: 0.03 10*3/uL (ref 0.00–0.07)
Basophils Absolute: 0 10*3/uL (ref 0.0–0.1)
Basophils Relative: 1 %
Eosinophils Absolute: 0.1 10*3/uL (ref 0.0–0.5)
Eosinophils Relative: 2 %
HCT: 45.7 % (ref 39.0–52.0)
Hemoglobin: 15.3 g/dL (ref 13.0–17.0)
Immature Granulocytes: 0 %
Lymphocytes Relative: 15 %
Lymphs Abs: 1.1 10*3/uL (ref 0.7–4.0)
MCH: 29.7 pg (ref 26.0–34.0)
MCHC: 33.5 g/dL (ref 30.0–36.0)
MCV: 88.7 fL (ref 80.0–100.0)
Monocytes Absolute: 0.5 10*3/uL (ref 0.1–1.0)
Monocytes Relative: 7 %
Neutro Abs: 5.7 10*3/uL (ref 1.7–7.7)
Neutrophils Relative %: 75 %
Platelets: 143 10*3/uL — ABNORMAL LOW (ref 150–400)
RBC: 5.15 MIL/uL (ref 4.22–5.81)
RDW: 13.6 % (ref 11.5–15.5)
WBC: 7.6 10*3/uL (ref 4.0–10.5)
nRBC: 0 % (ref 0.0–0.2)

## 2021-08-31 NOTE — Progress Notes (Signed)
HCT 45.7. No phlebotomy required today. Pt and wife made aware. ?

## 2021-09-17 ENCOUNTER — Ambulatory Visit: Payer: Medicare PPO

## 2021-09-24 DIAGNOSIS — H2513 Age-related nuclear cataract, bilateral: Secondary | ICD-10-CM | POA: Diagnosis not present

## 2021-09-24 DIAGNOSIS — H40003 Preglaucoma, unspecified, bilateral: Secondary | ICD-10-CM | POA: Diagnosis not present

## 2021-09-24 DIAGNOSIS — E119 Type 2 diabetes mellitus without complications: Secondary | ICD-10-CM | POA: Diagnosis not present

## 2021-09-24 DIAGNOSIS — H353131 Nonexudative age-related macular degeneration, bilateral, early dry stage: Secondary | ICD-10-CM | POA: Diagnosis not present

## 2021-09-24 LAB — HM DIABETES EYE EXAM

## 2021-10-03 ENCOUNTER — Ambulatory Visit (INDEPENDENT_AMBULATORY_CARE_PROVIDER_SITE_OTHER): Payer: Medicare PPO

## 2021-10-03 VITALS — Ht 69.0 in | Wt 144.0 lb

## 2021-10-03 DIAGNOSIS — Z0000 Encounter for general adult medical examination without abnormal findings: Secondary | ICD-10-CM

## 2021-10-03 NOTE — Patient Instructions (Addendum)
?  Mr. Slabach , ?Thank you for taking time to come for your Medicare Wellness Visit. I appreciate your ongoing commitment to your health goals. Please review the following plan we discussed and let me know if I can assist you in the future.  ? ?These are the goals we discussed: ? Goals   ? ?  Follow up with Primary Care Provider   ?  As needed. ?  ? ?  ?  ?This is a list of the screening recommended for you and due dates:  ?Health Maintenance  ?Topic Date Due  ? Urine Protein Check  11/10/2021  ? COVID-19 Vaccine (4 - Booster for Pfizer series) 10/19/2021*  ? Complete foot exam   11/08/2021*  ? Hemoglobin A1C  11/09/2021  ? Flu Shot  01/08/2022  ? Eye exam for diabetics  03/27/2022  ? Tetanus Vaccine  06/12/2023  ? Pneumonia Vaccine  Completed  ? Zoster (Shingles) Vaccine  Completed  ? HPV Vaccine  Aged Out  ?*Topic was postponed. The date shown is not the original due date.  ?  ?

## 2021-10-03 NOTE — Progress Notes (Addendum)
Subjective:   Anthony Allen is a 86 y.o. male who presents for Medicare Annual/Subsequent preventive examination.  Review of Systems    No ROS.  Medicare Wellness Virtual Visit.  Visual/audio telehealth visit, UTA vital signs.   See social history for additional risk factors.   Cardiac Risk Factors include: advanced age (>87men, >92 women);male gender;diabetes mellitus     Objective:    Today's Vitals   10/03/21 1506  Weight: 144 lb (65.3 kg)  Height: 5\' 9"  (1.753 m)   Body mass index is 21.27 kg/m.     10/03/2021    3:20 PM 05/25/2021    1:10 PM 09/14/2020    3:03 PM 09/14/2019    2:40 PM 11/30/2018    2:11 PM 09/18/2018   10:31 AM 11/13/2017    2:20 PM  Advanced Directives  Does Patient Have a Medical Advance Directive? Yes Yes Yes Yes No Yes Yes  Type of Estate agent of [place];Living will Living will;Healthcare Power of [employer] Power of [place];Living will Healthcare Power of [place];Living will Living will  Healthcare Power of [place];Living will  Does patient want to make changes to medical advance directive? No - Patient declined Yes (ED - Information included in AVS) No - Patient declined No - Patient declined No - Patient declined    Copy of Healthcare Power of Attorney in Chart? No - copy requested  No - copy requested No - copy requested No - copy requested  No - copy requested  Would patient like information on creating a medical advance directive?     No - Patient declined      Current Medications (verified) Outpatient Encounter Medications as of 10/03/2021  Medication Sig   acetaminophen (TYLENOL) 500 MG tablet Take 500 mg by mouth every 6 (six) hours as needed.   ALPRAZolam (XANAX) 0.5 MG tablet TAKE 1 TABLET(0.5 MG) BY MOUTH AT BEDTIME AS NEEDED FOR ANXIETY   Cholecalciferol (VITAMIN D-3) 1000 units CAPS Take 1,000 Units by mouth daily.   clindamycin (CLEOCIN-T) 1 % lotion Apply topically daily. Apply to neck and  shoulders daily   clopidogrel (PLAVIX) 75 MG tablet TAKE 1 TABLET(75 MG) BY MOUTH DAILY   fluticasone (FLONASE) 50 MCG/ACT nasal spray Place 2 sprays into both nostrils daily as needed for allergies or rhinitis.    gabapentin (NEURONTIN) 100 MG capsule  (Patient not taking: Reported on 05/11/2021)   hydrocortisone 2.5 % lotion Apply to affected areas of face QOD alternating with Ketoconazole 2% cream.  Also may use on eczema on left lower leg daily up to 5 days a week until clear   JARDIANCE 10 MG TABS tablet TAKE 1 TABLET(10 MG) BY MOUTH DAILY   ketoconazole (NIZORAL) 2 % shampoo Shampoo into the scalp and face. Let sit 5-10 minutes then wash off. Use 3d/wk.   latanoprost (XALATAN) 0.005 % ophthalmic solution Instill 1 drop into both eyes in the evening   Menthol, Topical Analgesic, (BLUE-EMU MAXIMUM STRENGTH EX) Apply topically.   mirtazapine (REMERON) 7.5 MG tablet TAKE 1 TABLET(7.5 MG) BY MOUTH AT BEDTIME   mometasone (ELOCON) 0.1 % lotion Apply to affected areas of the scalp QOD   NYSTATIN powder APPLY TOPICALLY TWICE A DAY AS NEEDED FOR RASH   rosuvastatin (CRESTOR) 10 MG tablet TAKE 1 TABLET(10 MG) BY MOUTH DAILY   timolol (TIMOPTIC) 0.5 % ophthalmic solution Instill 1 drop into the right eye in the morning   No facility-administered encounter medications on file as of 10/03/2021.  Allergies (verified) Patient has no known allergies.   History: Past Medical History:  Diagnosis Date   Arthritis    "mild in my hands" (06/04/2017)   Basal cell carcinoma 01/28/2008   L lat nose    Complication of anesthesia    "was told never to use Propofol because of parent's adverse reaction" (06/04/2017)   CVA (cerebral vascular accident) (HCC)    hospitalized from 12/22-12/24 due to left-sided weakness, slurred speech and difficulty swallowing. He was diagnosed as left pontine stroke./notes 06/03/2017   Family history of adverse reaction to anesthesia    "mother and dad had allergic reaction  Propofol" (06/04/2017)   Glaucoma, both eyes    Gout    Hypertension    Parotid mass    Stroke (cerebrum) (HCC) 06/03/2017   Type 2 diabetes mellitus with hyperglycemia (HCC)    Past Surgical History:  Procedure Laterality Date   TONSILLECTOMY  1943   Family History  Problem Relation Age of Onset   Arthritis Mother    Stroke Father    Hypertension Father    Heart disease Father 41       AMI   Kidney disease Father        bladder ca congenital loss of kidney   Arthritis Maternal Grandmother    Early death Daughter 59       aspiration   Cancer Brother        Scalp   Macular degeneration Brother    Social History   Socioeconomic History   Marital status: Married    Spouse name: Not on file   Number of children: Not on file   Years of education: Not on file   Highest education level: Not on file  Occupational History   Not on file  Tobacco Use   Smoking status: Never   Smokeless tobacco: Never  Vaping Use   Vaping Use: Never used  Substance and Sexual Activity   Alcohol use: Yes    Comment: 1 glass of wine per month   Drug use: No   Sexual activity: Not Currently  Other Topics Concern   Not on file  Social History Narrative   Not on file   Social Determinants of Health   Financial Resource Strain: Low Risk    Difficulty of Paying Living Expenses: Not hard at all  Food Insecurity: No Food Insecurity   Worried About Programme researcher, broadcasting/film/video in the Last Year: Never true   Ran Out of Food in the Last Year: Never true  Transportation Needs: No Transportation Needs   Lack of Transportation (Medical): No   Lack of Transportation (Non-Medical): No  Physical Activity: Not on file  Stress: No Stress Concern Present   Feeling of Stress : Not at all  Social Connections: Unknown   Frequency of Communication with Friends and Family: More than three times a week   Frequency of Social Gatherings with Friends and Family: Once a week   Attends Religious Services: Not on Marketing executive or Organizations: Not on file   Attends Banker Meetings: Not on file   Marital Status: Married    Tobacco Counseling Counseling given: Not Answered   Clinical Intake:  Pre-visit preparation completed: Yes        Diabetes: No  How often do you need to have someone help you when you read instructions, pamphlets, or other written materials from your doctor or pharmacy?: 2 - Rarely    Interpreter  Needed?: No      Activities of Daily Living    10/03/2021    3:06 PM  In your present state of health, do you have any difficulty performing the following activities:  Hearing? 0  Vision? 0  Comment Age appropriate  Walking or climbing stairs? 1  Comment Walker in use  Dressing or bathing? 1  Comment Wife assist  Doing errands, shopping? 1  Comment He does not drive  Preparing Food and eating ? Y  Comment Wife assist with meal prep. Self feeds.  Using the Toilet? N  In the past six months, have you accidently leaked urine? N  Do you have problems with loss of bowel control? N  Managing your Medications? Y  Comment Wife manages  Managing your Finances? Y  Comment Wife assist  Housekeeping or managing your Housekeeping? Y  Comment Wife assist    Patient Care Team: [name], MD as PCP - General (Internal Medicine)  Indicate any recent Medical Services you may have received from other than [facility] providers in the past year (date may be approximate).     Assessment:   This is a routine wellness examination for Vignesh.  Virtual Visit via Telephone Note  I connected with  Anthony Allen on 10/03/21 at  3:00 PM EDT by telephone and verified that I am speaking with the correct person using two identifiers.  Persons participating in the virtual visit: patient/Nurse Health Advisor   I discussed the limitations of performing an evaluation and management service by telehealth. The patient expressed understanding and agreed  to proceed. We continued and completed visit with audio only. Some vital signs may be absent or patient reported.   Hearing/Vision screen Hearing Screening - Comments:: Patient is able to hear conversational tones without difficulty. No issues reported. Vision Screening - Comments:: Followed by [facility]  Wears corrective lenses Glaucoma; drops in use  Visits every 6 months  No retinopathy reported   Dietary issues and exercise activities discussed: Current Exercise Habits: The patient does not participate in regular exercise at present Healthy diet Good water intake   Goals Addressed             This Visit's Progress    Follow up with Primary Care Provider       As needed.       Depression Screen    10/03/2021    3:13 PM 05/11/2021    3:23 PM 11/09/2020   12:22 PM 09/14/2020    3:02 PM 09/14/2019    2:41 PM 08/25/2019    2:40 PM 10/31/2017    9:33 AM  PHQ 2/9 Scores  PHQ - 2 Score 0 0 0 0 0 0 1  PHQ- 9 Score       8    Fall Risk    10/03/2021    3:21 PM 05/11/2021    3:23 PM 11/09/2020   12:22 PM 09/14/2020    3:05 PM 02/04/2020    8:12 AM  Fall Risk   Falls in the past year? 0 0 0 0 0  Number falls in past yr: 0  0 0   Injury with Fall?   0 0   Risk for fall due to : Impaired balance/gait Impaired mobility  Impaired balance/gait   Follow up Falls evaluation completed Falls evaluation completed Falls evaluation completed Falls evaluation completed Falls evaluation completed    FALL RISK PREVENTION PERTAINING TO THE HOME:  Home free of loose throw rugs  in walkways, pet beds, electrical cords, etc? Yes  Adequate lighting in your home to reduce risk of falls? Yes   ASSISTIVE DEVICES UTILIZED TO PREVENT FALLS: Life alert? Yes  Use of a cane, walker or w/c? Yes  Grab bars in the bathroom? Yes  Shower chair or bench in shower? Yes  Elevated toilet seat or a handicapped toilet? Yes   TIMED UP AND GO: Was the test performed? No .   Cognitive Function:     09/14/2019    3:05 PM 09/05/2016   10:29 AM 09/06/2015   10:55 AM  MMSE - Mini Mental State Exam  Not completed: Unable to complete    Orientation to time  5 5  Orientation to Place  5 5  Registration  3 3  Attention/ Calculation  5 5  Recall  3 3  Language- name 2 objects  2 2  Language- repeat  1 1  Language- follow 3 step command  3 3  Language- read & follow direction  1 1  Write a sentence  1 1  Copy design  1 1  Total score  30 30        10/03/2021    3:25 PM  6CIT Screen  What Year? 0 points  What month? 0 points  What time? 0 points  Months in reverse 0 points    Immunizations Immunization History  Administered Date(s) Administered   Fluad Quad(high Dose 65+) 03/03/2019, 03/13/2020   Influenza Split 06/15/2013, 02/08/2014   Influenza, High Dose Seasonal PF 02/21/2017, 03/02/2018   Influenza-Unspecified 02/09/2015, 03/10/2016   PFIZER(Purple Top)SARS-COV-2 Vaccination 07/09/2019, 07/30/2019, 02/23/2020   Pneumococcal Conjugate-13 06/23/2013   Pneumococcal Polysaccharide-23 05/02/2015   Tdap 06/11/2013   Zoster Recombinat (Shingrix) 02/21/2017, 05/03/2017   Zoster, Live 07/01/2013   Screening Tests Health Maintenance  Topic Date Due   URINE MICROALBUMIN  11/10/2021   COVID-19 Vaccine (4 - Booster for Pfizer series) 10/19/2021 (Originally 04/19/2020)   FOOT EXAM  11/08/2021 (Originally 03/07/1946)   HEMOGLOBIN A1C  11/09/2021   INFLUENZA VACCINE  01/08/2022   OPHTHALMOLOGY EXAM  03/27/2022   TETANUS/TDAP  06/12/2023   Pneumonia Vaccine 65+ Years old  Completed   Zoster Vaccines- Shingrix  Completed   HPV VACCINES  Aged Out   Health Maintenance Health Maintenance Due  Topic Date Due   URINE MICROALBUMIN  11/10/2021   Lung Cancer Screening: (Low Dose CT Chest recommended if Age 67-80 years, 30 pack-year currently smoking OR have quit w/in 15years.) does not qualify.   Vision Screening: Recommended annual ophthalmology exams for early detection of glaucoma  and other disorders of the eye.  Dental Screening: Recommended annual dental exams for proper oral hygiene  Community Resource Referral / Chronic Care Management: CRR required this visit?  No   CCM required this visit?  No      Plan:     I have personally reviewed and noted the following in the patient's chart:   Medical and social history Use of alcohol, tobacco or illicit drugs  Current medications and supplements including opioid prescriptions. Patient is not currently taking opioid prescriptions. Functional ability and status Nutritional status Physical activity Advanced directives List of other physicians Hospitalizations, surgeries, and ER visits in previous 12 months Vitals Screenings to include cognitive, depression, and falls Referrals and appointments  In addition, I have reviewed and discussed with patient certain preventive protocols, quality metrics, and best practice recommendations. A written personalized care plan for preventive services as well as general preventive  health recommendations were provided to patient.     OBrien-Blaney, Jese Comella L, LPN   1/61/0960    I have reviewed the above information and agree with above.   Duncan Dull, MD

## 2021-10-06 ENCOUNTER — Other Ambulatory Visit: Payer: Self-pay | Admitting: Internal Medicine

## 2021-10-06 ENCOUNTER — Other Ambulatory Visit: Payer: Self-pay | Admitting: Dermatology

## 2021-10-06 DIAGNOSIS — L739 Follicular disorder, unspecified: Secondary | ICD-10-CM

## 2021-10-06 DIAGNOSIS — L219 Seborrheic dermatitis, unspecified: Secondary | ICD-10-CM

## 2021-11-02 ENCOUNTER — Other Ambulatory Visit: Payer: Self-pay | Admitting: Internal Medicine

## 2021-11-09 ENCOUNTER — Encounter: Payer: Self-pay | Admitting: Internal Medicine

## 2021-11-09 ENCOUNTER — Ambulatory Visit: Payer: Medicare PPO | Admitting: Internal Medicine

## 2021-11-09 VITALS — BP 140/78 | HR 83 | Temp 98.0°F | Ht 69.0 in | Wt 141.0 lb

## 2021-11-09 DIAGNOSIS — E1122 Type 2 diabetes mellitus with diabetic chronic kidney disease: Secondary | ICD-10-CM | POA: Diagnosis not present

## 2021-11-09 DIAGNOSIS — I6930 Unspecified sequelae of cerebral infarction: Secondary | ICD-10-CM

## 2021-11-09 DIAGNOSIS — N1831 Chronic kidney disease, stage 3a: Secondary | ICD-10-CM | POA: Diagnosis not present

## 2021-11-09 DIAGNOSIS — I69351 Hemiplegia and hemiparesis following cerebral infarction affecting right dominant side: Secondary | ICD-10-CM | POA: Diagnosis not present

## 2021-11-09 DIAGNOSIS — E1169 Type 2 diabetes mellitus with other specified complication: Secondary | ICD-10-CM | POA: Diagnosis not present

## 2021-11-09 DIAGNOSIS — E785 Hyperlipidemia, unspecified: Secondary | ICD-10-CM | POA: Diagnosis not present

## 2021-11-09 DIAGNOSIS — I700 Atherosclerosis of aorta: Secondary | ICD-10-CM | POA: Diagnosis not present

## 2021-11-09 MED ORDER — MIRTAZAPINE 15 MG PO TABS
15.0000 mg | ORAL_TABLET | Freq: Every day | ORAL | 1 refills | Status: DC
Start: 2021-11-09 — End: 2022-05-15

## 2021-11-09 NOTE — Progress Notes (Signed)
Subjective:  Patient ID: Anthony Allen, male    DOB: [birth year]  Age: 86 y.o. MRN: [id number]  CC: The primary encounter diagnosis was Type 2 diabetes mellitus with diabetic chronic kidney disease, unspecified CKD stage, unspecified whether long term insulin use ([place]). Diagnoses of Hyperlipidemia associated with type 2 diabetes mellitus ([place]), Aortic atherosclerosis ([place]), Stage 3a chronic kidney disease ([place]), Hemiplegia and hemiparesis following cerebral infarction affecting right dominant side ([place]), and Late effects of cerebral ischemic stroke were also pertinent to this visit.   HPI Anthony Allen presents for follow up on type 2 DM,  CVD with hemiplegia and emotional lability Chief Complaint  Patient presents with   Follow-up    6 month follow up     1) type 2 DM:  he  feels generally well,  But is not  exercising regularly  due to post CVA hemiplegia .  Weight is down 3 lbs. . Checking  blood sugars less than once daily at variable times, usually only if he feels he may be having a hypoglycemic event. .  BS have been under 130 fasting and < 150 post prandially.  Denies any recent hypoglyemic events.  Taking   medications as directed. Following a carbohydrate modified diet 6 days per week. Denies numbness, burning and tingling of extremities. Appetite is good.     2) hemiplegia,:  from CVA 4 years ago   using a walker and a recliner with stiff arms. Able  to get up unassisted.  Some mood lability,  occasional lapse in stream of thought   Afraid to use a cane for fear of falling   3) Underweight:  appetite is small,  eats 3 meals daily. Had dental work recently, by [employer] , implants,  etc    the work was extensive,  still has difficulty chewing certain things .  now drinking 2 protein shakes daily   Outpatient Medications Prior to Visit  Medication Sig Dispense Refill   acetaminophen (TYLENOL) 500 MG tablet Take 500 mg by mouth every 6 (six) hours as needed.     ALPRAZolam  (XANAX) 0.5 MG tablet TAKE 1 TABLET(0.5 MG) BY MOUTH AT BEDTIME AS NEEDED FOR ANXIETY 30 tablet 2   Cholecalciferol (VITAMIN D-3) 1000 units CAPS Take 1,000 Units by mouth daily.     clindamycin (CLEOCIN T) 1 % lotion APPLY TOPICALLY TO THE AFFECTED AREA DAILY TO TWICE DAILY AS DIRECTED 60 mL 6   clopidogrel (PLAVIX) 75 MG tablet TAKE 1 TABLET(75 MG) BY MOUTH DAILY 90 tablet 1   fluticasone (FLONASE) 50 MCG/ACT nasal spray Place 2 sprays into both nostrils daily as needed for allergies or rhinitis.      hydrocortisone 2.5 % lotion APPLY TO THE AFFECTED AREA OF FACE EVERY OTHER DAY ALTERNATING WITH KETOCONAZOLE. APPLY TO LOWER LEGS AND ANKLES DAILY TO TWICE DAILY AS NEEDED 118 mL 6   JARDIANCE 10 MG TABS tablet TAKE 1 TABLET(10 MG) BY MOUTH DAILY 90 tablet 1   ketoconazole (NIZORAL) 2 % cream APPLY TO THE AFFECTED AREA OF THE FACE EVERY OTHER DAY ALTERNATING WITH HYDROCORTISONE CREAM. USE IN THE GROIN/INNER THIGH DAILY AS NEEDED FOR RASH 60 g 6   ketoconazole (NIZORAL) 2 % shampoo Shampoo into the scalp and face. Let sit 5-10 minutes then wash off. Use 3d/wk. 120 mL 6   latanoprost (XALATAN) 0.005 % ophthalmic solution Instill 1 drop into both eyes in the evening  0   Menthol, Topical Analgesic, (BLUE-EMU MAXIMUM STRENGTH EX) Apply  topically.     mometasone (ELOCON) 0.1 % lotion Apply to affected areas of the scalp QOD 60 mL 6   NYSTATIN powder APPLY TOPICALLY TWICE A DAY AS NEEDED FOR RASH 60 g 2   rosuvastatin (CRESTOR) 10 MG tablet TAKE 1 TABLET(10 MG) BY MOUTH DAILY 90 tablet 3   timolol (TIMOPTIC) 0.5 % ophthalmic solution Instill 1 drop into the right eye in the morning  0   mirtazapine (REMERON) 7.5 MG tablet TAKE 1 TABLET(7.5 MG) BY MOUTH AT BEDTIME 90 tablet 1   gabapentin (NEURONTIN) 100 MG capsule  (Patient not taking: Reported on 05/11/2021)     No facility-administered medications prior to visit.    Review of Systems;  Patient denies headache, fevers, malaise, unintentional weight  loss, skin rash, eye pain, sinus congestion and sinus pain, sore throat, dysphagia,  hemoptysis , cough, dyspnea, wheezing, chest pain, palpitations, orthopnea, edema, abdominal pain, nausea, melena, diarrhea, constipation, flank pain, dysuria, hematuria, urinary  Frequency, nocturia, numbness, tingling, seizures,  Focal weakness, Loss of consciousness,  Tremor, insomnia, depression, anxiety, and suicidal ideation.      Objective:  BP 140/78 (BP Location: Left Arm, Patient Position: Sitting, Cuff Size: Normal)   Pulse 83   Temp 98 F (36.7 C) (Oral)   Ht _0  (1.753 m)   Wt 141 lb (64 kg)   SpO2 97%   BMI 20.82 kg/m   BP Readings from Last 3 Encounters:  11/09/21 140/78  05/25/21 (!) 144/96  05/11/21 114/68    Wt Readings from Last 3 Encounters:  11/09/21 141 lb (64 kg)  10/03/21 144 lb (65.3 kg)  05/25/21 144 lb (65.3 kg)    General appearance: alert, cooperative and appears stated age Ears: normal TM's and external ear canals both ears Throat: lips, mucosa, and tongue normal; teeth and gums normal Neck: no adenopathy, no carotid bruit, supple, symmetrical, trachea midline and thyroid not enlarged, symmetric, no tenderness/mass/nodules Back: symmetric, no curvature. ROM normal. No CVA tenderness. Lungs: clear to auscultation bilaterally Heart: regular rate and rhythm, S1, S2 normal, no murmur, click, rub or gallop Abdomen: soft, non-tender; bowel sounds normal; no masses,  no organomegaly Pulses: 2+ and symmetric Skin: Skin color, texture, turgor normal. No rashes or lesions Lymph nodes: Cervical, supraclavicular, and axillary nodes normal.  Lab Results  Component Value Date   HGBA1C 6.4 (H) 11/09/2021   HGBA1C 6.1 (H) 05/11/2021   HGBA1C 6.2 11/09/2020    Lab Results  Component Value Date   CREATININE 1.52 (H) 11/09/2021   CREATININE 1.49 (H) 05/11/2021   CREATININE 1.46 11/09/2020    Lab Results  Component Value Date   WBC 7.6 08/31/2021   HGB 15.3  08/31/2021   HCT 45.7 08/31/2021   PLT 143 (L) 08/31/2021   GLUCOSE 169 (H) 11/09/2021   CHOL 91 11/09/2021   TRIG 144 11/09/2021   HDL 35 (L) 11/09/2021   LDLDIRECT 34 11/09/2021   LDLCALC 33 11/09/2021   ALT 21 11/09/2021   AST 25 11/09/2021   NA 142 11/09/2021   K 3.8 11/09/2021   CL 107 11/09/2021   CREATININE 1.52 (H) 11/09/2021   BUN 19 11/09/2021   CO2 22 11/09/2021   TSH 1.561 06/01/2017   PSA 1.52 06/23/2013   INR 1.05 06/03/2017   HGBA1C 6.4 (H) 11/09/2021   MICROALBUR 6.0 (H) 11/10/2020    MR FACE/TRIGEMINAL WO/W CM  Result Date: 10/28/2017 CLINICAL DATA:  Left parapharyngeal mass. EXAM: MRI FACE TRIGEMINAL WITHOUT AND WITH CONTRAST TECHNIQUE:  Multiplanar, multiecho pulse sequences of the face and surrounding structures, including thin slice imaging of the course of the Trigeminal Nerves, were obtained both before and after administration of intravenous contrast. CONTRAST:  2m MULTIHANCE GADOBENATE DIMEGLUMINE 529 MG/ML IV SOLN COMPARISON:  Brain MRI 06/01/2017 FINDINGS: There are old infarcts of the right corona radiata and left pons. The visualized intracranial contents are otherwise unremarkable. 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 There is no extension into the skull base. The lesion measures approximately 4.0 x 0.7 x 2.3 cm. There is no other skull base abnormality. Dedicated trigeminal nerve imaging was also performed. There is no focal abnormality along the trigeminal nerves. Meckel's cave is patent bilaterally. No abnormality at the foramina rotundum or ovale. IMPRESSION: 1. 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 Histologic sampling should be considered. 2. No extension through the skull base. Normal appearance of the visible cranial nerves. 3. 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 Electronically Signed   By: KUlyses JarredM.D.   On: 10/28/2017 15:44    Assessment & Plan:   Problem List Items Addressed This Visit     Aortic atherosclerosis ([place]    Noted on 2014 CT .  Reviewed findings of prior CT scan today..  Patient is tolerating high potency statin therapy        CKD (chronic kidney disease), stage III ([place]    Secondary to DM and hypertension.  Avoiding NSAIDs.  Lytes,  PTH have been normal        Relevant Orders   PTH, intact (no Ca)   Hemiplegia and hemiparesis following cerebral infarction affecting right dominant side ([place]    He continues to use a walker due to fear of falling        Hyperlipidemia associated with type 2 diabetes mellitus ([place]    Historically well-controlled on Jardiance.  hemoglobin A1c is < 6.5.  Patient  is up-to-date on eye exams and foot exam is normal today  .Patient is tolerating statin therapy for CAD risk reduction and ACE/ARB has been suspended due to hypotension,  He has mild microalbuminuria   Lab Results  Component Value Date   HGBA1C 6.4 (H) 11/09/2021   No results found for: LIPASE Lab Results  Component Value Date   CHOL 91 11/09/2021   HDL 35 (L) 11/09/2021   LDLCALC 33 11/09/2021   LDLDIRECT 34 11/09/2021   TRIG 144 11/09/2021   CHOLHDL 2.6 11/09/2021   Lab Results  Component Value Date   MICROALBUR 6.0 (H) 11/10/2020   MICROALBUR 2.2 (H) 09/27/2019         Relevant Orders   Lipid Profile (Completed)   Direct LDL (Completed)   Late effects of cerebral ischemic stroke    He continues to require assistance with ADLs due to right sided hemiparesis.  He uses a walker in the house and a wheelchair outside of the home for distances > 20 feet.        RESOLVED: Type II diabetes mellitus with renal  manifestations ([place] Primary   Relevant Orders   Comp Met (CMET) (Completed)   HgB A1c (Completed)   Urine Microalbumin w/creat. ratio  Follow-up: No follow-ups on file.   Crecencio Mc, MD

## 2021-11-09 NOTE — Patient Instructions (Signed)
You look good!  Your speech is very articulate and better than the last time we spoke.  I want you to try a higher dose of Remeron (mirtazipine) to improve your mood, energy and appetite  I have increased the dose to 15 mg daily

## 2021-11-09 NOTE — Assessment & Plan Note (Signed)
Noted on 2014 CT .  Reviewed findings of prior CT scan today..  Patient is tolerating high potency statin therapy

## 2021-11-09 NOTE — Assessment & Plan Note (Signed)
Secondary to DM and hypertension.  Avoiding NSAIDs.  Lytes,  PTH have been normal

## 2021-11-11 NOTE — Assessment & Plan Note (Signed)
He continues to require assistance with ADLs due to right sided hemiparesis.  He uses a walker in the house and a wheelchair outside of the home for distances > 20 feet.

## 2021-11-11 NOTE — Assessment & Plan Note (Signed)
Historically well-controlled on Jardiance.  hemoglobin A1c is < 6.5.  Patient  is up-to-date on eye exams and foot exam is normal today  .Patient is tolerating statin therapy for CAD risk reduction and ACE/ARB has been suspended due to hypotension,  He has mild microalbuminuria   Lab Results  Component Value Date   HGBA1C 6.4 (H) 11/09/2021    No results found for: LIPASE Lab Results  Component Value Date   CHOL 91 11/09/2021   HDL 35 (L) 11/09/2021   LDLCALC 33 11/09/2021   LDLDIRECT 34 11/09/2021   TRIG 144 11/09/2021   CHOLHDL 2.6 11/09/2021    Lab Results  Component Value Date   MICROALBUR 6.0 (H) 11/10/2020   MICROALBUR 2.2 (H) 09/27/2019

## 2021-11-11 NOTE — Assessment & Plan Note (Signed)
He continues to use a walker due to fear of falling

## 2021-11-12 ENCOUNTER — Telehealth: Payer: Self-pay | Admitting: Internal Medicine

## 2021-11-12 LAB — COMPREHENSIVE METABOLIC PANEL
AG Ratio: 1.6 (calc) (ref 1.0–2.5)
ALT: 21 U/L (ref 9–46)
AST: 25 U/L (ref 10–35)
Albumin: 4.2 g/dL (ref 3.6–5.1)
Alkaline phosphatase (APISO): 77 U/L (ref 35–144)
BUN/Creatinine Ratio: 13 (calc) (ref 6–22)
BUN: 19 mg/dL (ref 7–25)
CO2: 22 mmol/L (ref 20–32)
Calcium: 9.7 mg/dL (ref 8.6–10.3)
Chloride: 107 mmol/L (ref 98–110)
Creat: 1.52 mg/dL — ABNORMAL HIGH (ref 0.70–1.22)
Globulin: 2.7 g/dL (calc) (ref 1.9–3.7)
Glucose, Bld: 169 mg/dL — ABNORMAL HIGH (ref 65–99)
Potassium: 3.8 mmol/L (ref 3.5–5.3)
Sodium: 142 mmol/L (ref 135–146)
Total Bilirubin: 0.9 mg/dL (ref 0.2–1.2)
Total Protein: 6.9 g/dL (ref 6.1–8.1)

## 2021-11-12 LAB — HEMOGLOBIN A1C
Hgb A1c MFr Bld: 6.4 % of total Hgb — ABNORMAL HIGH (ref <5.7)
Mean Plasma Glucose: 137 mg/dL
eAG (mmol/L): 7.6 mmol/L

## 2021-11-12 LAB — LIPID PANEL
Cholesterol: 91 mg/dL (ref <200)
HDL: 35 mg/dL — ABNORMAL LOW (ref >=40)
LDL Cholesterol (Calc): 33 mg/dL (calc)
Non-HDL Cholesterol (Calc): 56 mg/dL (calc) (ref <130)
Total CHOL/HDL Ratio: 2.6 (calc) (ref <5.0)
Triglycerides: 144 mg/dL (ref <150)

## 2021-11-12 LAB — LDL CHOLESTEROL, DIRECT: Direct LDL: 34 mg/dL (ref <100)

## 2021-11-12 LAB — PARATHYROID HORMONE, INTACT (NO CA): PTH: 39 pg/mL (ref 16–77)

## 2021-11-12 MED ORDER — EMPAGLIFLOZIN 10 MG PO TABS: ORAL_TABLET | ORAL | 1 refills | Status: DC

## 2021-11-12 NOTE — Telephone Encounter (Signed)
Pt wife called stating pt need refill on jardiance sent to [facility]

## 2021-11-12 NOTE — Telephone Encounter (Signed)
Medication has been refilled.

## 2021-11-13 LAB — MICROALBUMIN / CREATININE URINE RATIO
Creatinine,U: 76.5 mg/dL
Microalb Creat Ratio: 8.4 mg/g (ref 0.0–30.0)
Microalb, Ur: 6.4 mg/dL — ABNORMAL HIGH (ref 0.0–1.9)

## 2021-11-15 ENCOUNTER — Telehealth: Payer: Self-pay | Admitting: Internal Medicine

## 2021-11-15 NOTE — Telephone Encounter (Signed)
Pt wife called stating they want to know the pt lab results. Pt would like to be called

## 2021-11-15 NOTE — Telephone Encounter (Signed)
Spoke with pt's wife and informed her of pt's lab results. Pt's wife gave a verbal understanding.

## 2021-11-23 ENCOUNTER — Inpatient Hospital Stay (HOSPITAL_BASED_OUTPATIENT_CLINIC_OR_DEPARTMENT_OTHER): Payer: Medicare PPO | Admitting: Oncology

## 2021-11-23 ENCOUNTER — Other Ambulatory Visit: Payer: Self-pay

## 2021-11-23 ENCOUNTER — Inpatient Hospital Stay: Payer: Medicare PPO | Attending: Oncology

## 2021-11-23 ENCOUNTER — Inpatient Hospital Stay: Payer: Medicare PPO

## 2021-11-23 ENCOUNTER — Encounter: Payer: Self-pay | Admitting: Oncology

## 2021-11-23 VITALS — BP 157/84 | HR 75 | Temp 98.0°F | Resp 16 | Wt 141.5 lb

## 2021-11-23 DIAGNOSIS — D751 Secondary polycythemia: Secondary | ICD-10-CM | POA: Diagnosis not present

## 2021-11-23 DIAGNOSIS — R5383 Other fatigue: Secondary | ICD-10-CM | POA: Insufficient documentation

## 2021-11-23 DIAGNOSIS — Z79899 Other long term (current) drug therapy: Secondary | ICD-10-CM | POA: Insufficient documentation

## 2021-11-23 LAB — CBC WITH DIFFERENTIAL/PLATELET
Abs Immature Granulocytes: 0.03 10*3/uL (ref 0.00–0.07)
Basophils Absolute: 0.1 10*3/uL (ref 0.0–0.1)
Basophils Relative: 1 %
Eosinophils Absolute: 0.2 10*3/uL (ref 0.0–0.5)
Eosinophils Relative: 2 %
HCT: 45.2 % (ref 39.0–52.0)
Hemoglobin: 15.2 g/dL (ref 13.0–17.0)
Immature Granulocytes: 0 %
Lymphocytes Relative: 10 %
Lymphs Abs: 0.8 10*3/uL (ref 0.7–4.0)
MCH: 30.7 pg (ref 26.0–34.0)
MCHC: 33.6 g/dL (ref 30.0–36.0)
MCV: 91.3 fL (ref 80.0–100.0)
Monocytes Absolute: 0.6 10*3/uL (ref 0.1–1.0)
Monocytes Relative: 7 %
Neutro Abs: 6.8 10*3/uL (ref 1.7–7.7)
Neutrophils Relative %: 80 %
Platelets: 158 10*3/uL (ref 150–400)
RBC: 4.95 MIL/uL (ref 4.22–5.81)
RDW: 14.9 % (ref 11.5–15.5)
WBC: 8.4 10*3/uL (ref 4.0–10.5)
nRBC: 0 % (ref 0.0–0.2)

## 2021-11-23 NOTE — Progress Notes (Signed)
HCT 45.7 today. No phlebotomy is required.

## 2021-11-24 ENCOUNTER — Encounter: Payer: Self-pay | Admitting: Oncology

## 2021-11-24 NOTE — Progress Notes (Signed)
Hematology/Oncology Consult note [facility]  Telephone:([phone] [phone]  Patient Care Team: Crecencio Mc, MD as PCP - General (Internal Medicine)   Name of the patient: Anthony Allen  [id number]  04-01-1936   Date of visit: 11/24/21  Diagnosis-secondary polycythemia of unclear etiology  Chief complaint/ Reason for visit-routine follow-up of secondary polycythemia  Heme/Onc history:  patient is a 86 year old male with a prior history of pontine stroke in 2018.  He has been referred to [place] for evaluation of polycythemia.  Of note patient has been evaluated for polycythemia in the past. Review of his prior CBCs reveals that he has had a high hemoglobin of around 18 even back in 2014.  He did have Jak 2 exon 12 mutation checked in 2018 which was negative.  Most recent CBC from 09/17/2018 showed a white count of 5.5, H&H of 18.4/53.4 and a platelet count of 150.  Patient has been a lifelong non-smoker.  He lives with his wife and does have some difficulty and needs assistance with his ADLs as well due to this stroke.  He is not on any testosterone replacement therapy.   Results of JAK2 mutation and exon 12 mutation done in 2018 negative.  No known chronic lung disease.  Given performance status and age bone marrow biopsy was not attempted.He also had CALR and MPL mutations checked which was negative.  Interval history- Patient is doing well for his age.  He has not had any recent hospitalizations.  Has baseline fatigue.  He has baseline hemiplegia from CVA 4 years ago.  ECOG PS- 3 Pain scale- 0   Review of systems- Review of Systems  Constitutional:  Positive for malaise/fatigue.  Neurological:  Positive for focal weakness (Hemiplegia from prior stroke).      No Known Allergies   Past Medical History:  Diagnosis Date   Arthritis    "mild in my hands" (06/04/2017)   Basal cell carcinoma 01/28/2008   L lat nose    Complication of  anesthesia    "was told never to use Propofol because of parent's adverse reaction" (06/04/2017)   CVA (cerebral vascular accident) ([place])    hospitalized from 12/22-12/24 due to left-sided weakness, slurred speech and difficulty swallowing. He was diagnosed as left pontine stroke./notes 06/03/2017   Family history of adverse reaction to anesthesia    "mother and dad had allergic reaction Propofol" (06/04/2017)   Glaucoma, both eyes    Gout    Hypertension    Parotid mass    Stroke (cerebrum) ([place]) 06/03/2017   Type 2 diabetes mellitus with hyperglycemia ([place])      Past Surgical History:  Procedure Laterality Date   TONSILLECTOMY  1943    Social History   Socioeconomic History   Marital status: Married    Spouse name: Not on file   Number of children: Not on file   Years of education: Not on file   Highest education level: Not on file  Occupational History   Not on file  Tobacco Use   Smoking status: Never   Smokeless tobacco: Never  Vaping Use   Vaping Use: Never used  Substance and Sexual Activity   Alcohol use: Yes    Comment: 1 glass of wine per month   Drug use: No   Sexual activity: Not Currently  Other Topics Concern   Not on file  Social History Narrative   Not on file   Social Determinants of Health   Financial  Resource Strain: Low Risk  (10/03/2021)   Overall Financial Resource Strain (CARDIA)    Difficulty of Paying Living Expenses: Not hard at all  Food Insecurity: No Food Insecurity (10/03/2021)   Hunger Vital Sign    Worried About Running Out of Food in the Last Year: Never true    Ran Out of Food in the Last Year: Never true  Transportation Needs: No Transportation Needs (10/03/2021)   PRAPARE - Hydrologist (Medical): No    Lack of Transportation (Non-Medical): No  Physical Activity: Not on file  Stress: No Stress Concern Present (10/03/2021)   [place]    Feeling of Stress : Not at all  Social Connections: Unknown (10/03/2021)   Social Connection and Isolation Panel [NHANES]    Frequency of Communication with Friends and Family: More than three times a week    Frequency of Social Gatherings with Friends and Family: Once a week    Attends Religious Services: Not on Advertising copywriter or Organizations: Not on file    Attends Archivist Meetings: Not on file    Marital Status: Married  Intimate Partner Violence: Not At Risk (10/03/2021)   Humiliation, Afraid, Rape, and Kick questionnaire    Fear of Current or Ex-Partner: No    Emotionally Abused: No    Physically Abused: No    Sexually Abused: No    Family History  Problem Relation Age of Onset   Arthritis Mother    Stroke Father    Hypertension Father    Heart disease Father 42       AMI   Kidney disease Father        bladder ca congenital loss of kidney   Arthritis Maternal Grandmother    Early death Daughter 16       aspiration   Cancer Brother        Scalp   Macular degeneration Brother      Current Outpatient Medications:    acetaminophen (TYLENOL) 500 MG tablet, Take 500 mg by mouth every 6 (six) hours as needed., Disp: , Rfl:    ALPRAZolam (XANAX) 0.5 MG tablet, TAKE 1 TABLET(0.5 MG) BY MOUTH AT BEDTIME AS NEEDED FOR ANXIETY, Disp: 30 tablet, Rfl: 2   Cholecalciferol (VITAMIN D-3) 1000 units CAPS, Take 1,000 Units by mouth daily., Disp: , Rfl:    clopidogrel (PLAVIX) 75 MG tablet, TAKE 1 TABLET(75 MG) BY MOUTH DAILY, Disp: 90 tablet, Rfl: 1   empagliflozin (JARDIANCE) 10 MG TABS tablet, TAKE 1 TABLET(10 MG) BY MOUTH DAILY, Disp: 90 tablet, Rfl: 1   fluticasone (FLONASE) 50 MCG/ACT nasal spray, Place 2 sprays into both nostrils daily as needed for allergies or rhinitis. , Disp: , Rfl:    hydrocortisone 2.5 % lotion, APPLY TO THE AFFECTED AREA OF FACE EVERY OTHER DAY ALTERNATING WITH KETOCONAZOLE. APPLY TO LOWER LEGS AND ANKLES DAILY TO  TWICE DAILY AS NEEDED, Disp: 118 mL, Rfl: 6   ketoconazole (NIZORAL) 2 % cream, APPLY TO THE AFFECTED AREA OF THE FACE EVERY OTHER DAY ALTERNATING WITH HYDROCORTISONE CREAM. USE IN THE GROIN/INNER THIGH DAILY AS NEEDED FOR RASH, Disp: 60 g, Rfl: 6   ketoconazole (NIZORAL) 2 % shampoo, Shampoo into the scalp and face. Let sit 5-10 minutes then wash off. Use 3d/wk., Disp: 120 mL, Rfl: 6   latanoprost (XALATAN) 0.005 % ophthalmic solution, Instill 1 drop into both eyes in the  evening, Disp: , Rfl: 0   mirtazapine (REMERON) 15 MG tablet, Take 1 tablet (15 mg total) by mouth at bedtime., Disp: 90 tablet, Rfl: 1   mometasone (ELOCON) 0.1 % lotion, Apply to affected areas of the scalp QOD, Disp: 60 mL, Rfl: 6   rosuvastatin (CRESTOR) 10 MG tablet, TAKE 1 TABLET(10 MG) BY MOUTH DAILY, Disp: 90 tablet, Rfl: 3   timolol (TIMOPTIC) 0.5 % ophthalmic solution, Instill 1 drop into the right eye in the morning, Disp: , Rfl: 0   clindamycin (CLEOCIN T) 1 % lotion, APPLY TOPICALLY TO THE AFFECTED AREA DAILY TO TWICE DAILY AS DIRECTED (Patient not taking: Reported on 11/23/2021), Disp: 60 mL, Rfl: 6   Menthol, Topical Analgesic, (BLUE-EMU MAXIMUM STRENGTH EX), Apply topically. (Patient not taking: Reported on 11/23/2021), Disp: , Rfl:    NYSTATIN powder, APPLY TOPICALLY TWICE A DAY AS NEEDED FOR RASH (Patient not taking: Reported on 11/23/2021), Disp: 60 g, Rfl: 2  Physical exam:  Vitals:   11/23/21 1324  BP: (!) 157/84  Pulse: 75  Resp: 16  Temp: 98 F (36.7 C)  SpO2: 93%  Weight: 141 lb 8 oz (64.2 kg)   Physical Exam Constitutional:      General: He is not in acute distress. Cardiovascular:     Rate and Rhythm: Normal rate and regular rhythm.     Heart sounds: Normal heart sounds.  Pulmonary:     Effort: Pulmonary effort is normal.     Breath sounds: Normal breath sounds.  Abdominal:     General: Bowel sounds are normal.     Palpations: Abdomen is soft.  Skin:    General: Skin is warm and dry.   Neurological:     Mental Status: He is alert and oriented to person, place, and time.         Latest Ref Rng & Units 11/09/2021    3:41 PM  CMP  Glucose 65 - 99 mg/dL 169   BUN 7 - 25 mg/dL 19   Creatinine 0.70 - 1.22 mg/dL 1.52   Sodium 135 - 146 mmol/L 142   Potassium 3.5 - 5.3 mmol/L 3.8   Chloride 98 - 110 mmol/L 107   CO2 20 - 32 mmol/L 22   Calcium 8.6 - 10.3 mg/dL 9.7   Total Protein 6.1 - 8.1 g/dL 6.9   Total Bilirubin 0.2 - 1.2 mg/dL 0.9   AST 10 - 35 U/L 25   ALT 9 - 46 U/L 21       Latest Ref Rng & Units 11/23/2021   12:54 PM  CBC  WBC 4.0 - 10.5 K/uL 8.4   Hemoglobin 13.0 - 17.0 g/dL 15.2   Hematocrit 39.0 - 52.0 % 45.2   Platelets 150 - 400 K/uL 158      Assessment and plan- Patient is a 86 y.o. male with secondary polycythemia of unclear etiology here for routine follow-up  Presently patient's hemoglobin is 15 with a hematocrit of 45.2.  He does not require phlebotomy at this time.  I will consider a phlebotomy if his hematocrit is greater than 50-52 in the future.  We will continue to monitor his H&H in 3 months in 6 months and I will see him back in 6 months   Visit Diagnosis 1. Polycythemia, secondary      Dr. Randa Evens, MD, MPH [facility] [phone] 11/24/2021 9:59 PM

## 2022-02-22 ENCOUNTER — Inpatient Hospital Stay: Payer: Medicare PPO | Attending: Oncology

## 2022-02-22 DIAGNOSIS — D751 Secondary polycythemia: Secondary | ICD-10-CM | POA: Diagnosis not present

## 2022-02-22 LAB — CBC WITH DIFFERENTIAL/PLATELET
Abs Immature Granulocytes: 0.02 10*3/uL (ref 0.00–0.07)
Basophils Absolute: 0.1 10*3/uL (ref 0.0–0.1)
Basophils Relative: 1 %
Eosinophils Absolute: 0.3 10*3/uL (ref 0.0–0.5)
Eosinophils Relative: 3 %
HCT: 45.2 % (ref 39.0–52.0)
Hemoglobin: 15.4 g/dL (ref 13.0–17.0)
Immature Granulocytes: 0 %
Lymphocytes Relative: 14 %
Lymphs Abs: 1.1 10*3/uL (ref 0.7–4.0)
MCH: 31.3 pg (ref 26.0–34.0)
MCHC: 34.1 g/dL (ref 30.0–36.0)
MCV: 91.9 fL (ref 80.0–100.0)
Monocytes Absolute: 0.4 10*3/uL (ref 0.1–1.0)
Monocytes Relative: 5 %
Neutro Abs: 5.9 10*3/uL (ref 1.7–7.7)
Neutrophils Relative %: 77 %
Platelets: 171 10*3/uL (ref 150–400)
RBC: 4.92 MIL/uL (ref 4.22–5.81)
RDW: 13.6 % (ref 11.5–15.5)
WBC: 7.7 10*3/uL (ref 4.0–10.5)
nRBC: 0 % (ref 0.0–0.2)

## 2022-03-16 ENCOUNTER — Other Ambulatory Visit: Payer: Self-pay | Admitting: Internal Medicine

## 2022-03-16 DIAGNOSIS — U071 COVID-19: Secondary | ICD-10-CM | POA: Insufficient documentation

## 2022-03-16 MED ORDER — BENZONATATE 200 MG PO CAPS: 200.0000 mg | ORAL_CAPSULE | Freq: Three times a day (TID) | ORAL | 1 refills | Status: DC | PRN

## 2022-03-16 MED ORDER — MOLNUPIRAVIR EUA 200MG CAPSULE: 4.0000 | ORAL_CAPSULE | Freq: Two times a day (BID) | ORAL | 0 refills | Status: AC

## 2022-04-15 ENCOUNTER — Other Ambulatory Visit: Payer: Self-pay

## 2022-04-16 ENCOUNTER — Other Ambulatory Visit: Payer: Self-pay | Admitting: Internal Medicine

## 2022-04-16 NOTE — Telephone Encounter (Signed)
Pt need refill on ALPRAZolam sent to [facility]

## 2022-04-16 NOTE — Telephone Encounter (Signed)
Refilled: 10/08/2021 Last OV: 11/09/2021 Next OV: not scheduled

## 2022-04-17 MED ORDER — ALPRAZOLAM 0.5 MG PO TABS: ORAL_TABLET | ORAL | 2 refills | Status: DC

## 2022-05-13 ENCOUNTER — Other Ambulatory Visit: Payer: Self-pay | Admitting: Internal Medicine

## 2022-05-17 ENCOUNTER — Other Ambulatory Visit: Payer: Self-pay

## 2022-05-17 MED ORDER — EMPAGLIFLOZIN 10 MG PO TABS: ORAL_TABLET | ORAL | 1 refills | Status: DC

## 2022-06-07 ENCOUNTER — Inpatient Hospital Stay (HOSPITAL_BASED_OUTPATIENT_CLINIC_OR_DEPARTMENT_OTHER): Payer: Medicare PPO | Admitting: Oncology

## 2022-06-07 ENCOUNTER — Inpatient Hospital Stay: Payer: Medicare PPO | Attending: Oncology

## 2022-06-07 ENCOUNTER — Inpatient Hospital Stay: Payer: Medicare PPO

## 2022-06-07 ENCOUNTER — Encounter: Payer: Self-pay | Admitting: Oncology

## 2022-06-07 DIAGNOSIS — D751 Secondary polycythemia: Secondary | ICD-10-CM | POA: Diagnosis not present

## 2022-06-07 LAB — CBC WITH DIFFERENTIAL/PLATELET
Abs Immature Granulocytes: 0.02 10*3/uL (ref 0.00–0.07)
Basophils Absolute: 0.1 10*3/uL (ref 0.0–0.1)
Basophils Relative: 1 %
Eosinophils Absolute: 0.3 10*3/uL (ref 0.0–0.5)
Eosinophils Relative: 5 %
HCT: 45.4 % (ref 39.0–52.0)
Hemoglobin: 15.6 g/dL (ref 13.0–17.0)
Immature Granulocytes: 0 %
Lymphocytes Relative: 21 %
Lymphs Abs: 1.2 10*3/uL (ref 0.7–4.0)
MCH: 32 pg (ref 26.0–34.0)
MCHC: 34.4 g/dL (ref 30.0–36.0)
MCV: 93.2 fL (ref 80.0–100.0)
Monocytes Absolute: 0.4 10*3/uL (ref 0.1–1.0)
Monocytes Relative: 7 %
Neutro Abs: 3.8 10*3/uL (ref 1.7–7.7)
Neutrophils Relative %: 66 %
Platelets: 166 10*3/uL (ref 150–400)
RBC: 4.87 MIL/uL (ref 4.22–5.81)
RDW: 13.2 % (ref 11.5–15.5)
WBC: 5.7 10*3/uL (ref 4.0–10.5)
nRBC: 0 % (ref 0.0–0.2)

## 2022-06-07 NOTE — Progress Notes (Signed)
Hematology/Oncology Consult note [facility]  Telephone:([phone] [phone]  Patient Care Team: Crecencio Mc, MD as PCP - General (Internal Medicine) Sindy Guadeloupe, MD as Consulting Physician (Oncology)   Name of the patient: Anthony Allen  [id number]  17-Apr-1936   Date of visit: 06/07/22  Diagnosis-secondary polycythemia of unclear etiology  Chief complaint/ Reason for visit-routine follow-up of secondary polycythemia  Heme/Onc history: patient is a 86 year old male with a prior history of pontine stroke in 2018.  He has been referred to [place] for evaluation of polycythemia.  Of note patient has been evaluated for polycythemia in the past. Review of his prior CBCs reveals that he has had a high hemoglobin of around 18 even back in 2014.  He did have Jak 2 exon 12 mutation checked in 2018 which was negative.  Most recent CBC from 09/17/2018 showed a white count of 5.5, H&H of 18.4/53.4 and a platelet count of 150.  Patient has been a lifelong non-smoker.  He lives with his wife and does have some difficulty and needs assistance with his ADLs as well due to this stroke.  He is not on any testosterone replacement therapy.   Results of JAK2 mutation and exon 12 mutation done in 2018 negative.  No known chronic lung disease.  Given performance status and age bone marrow biopsy was not attempted.He also had CALR and MPL mutations checked which was negative.  Interval history-patient is doing well for his age.  No recent hospitalizations.  Weight has remained stable.  He does have poor dentition and finds it hard to eat solid food.  ECOG PS- 3 Pain scale- 0   Review of systems- Review of Systems  Constitutional:  Negative for chills, fever, malaise/fatigue and weight loss.  HENT:  Negative for congestion, ear discharge and nosebleeds.   Eyes:  Negative for blurred vision.  Respiratory:  Negative for cough, hemoptysis, sputum production, shortness of  breath and wheezing.   Cardiovascular:  Negative for chest pain, palpitations, orthopnea and claudication.  Gastrointestinal:  Negative for abdominal pain, blood in stool, constipation, diarrhea, heartburn, melena, nausea and vomiting.  Genitourinary:  Negative for dysuria, flank pain, frequency, hematuria and urgency.  Musculoskeletal:  Negative for back pain, joint pain and myalgias.  Skin:  Negative for rash.  Neurological:  Negative for dizziness, tingling, focal weakness, seizures, weakness and headaches.  Endo/Heme/Allergies:  Does not bruise/bleed easily.  Psychiatric/Behavioral:  Negative for depression and suicidal ideas. The patient does not have insomnia.       No Known Allergies   Past Medical History:  Diagnosis Date   Arthritis    "mild in my hands" (06/04/2017)   Basal cell carcinoma 01/28/2008   L lat nose    Complication of anesthesia    "was told never to use Propofol because of parent's adverse reaction" (06/04/2017)   CVA (cerebral vascular accident) ([place])    hospitalized from 12/22-12/24 due to left-sided weakness, slurred speech and difficulty swallowing. He was diagnosed as left pontine stroke./notes 06/03/2017   Family history of adverse reaction to anesthesia    "mother and dad had allergic reaction Propofol" (06/04/2017)   Glaucoma, both eyes    Gout    Hypertension    Parotid mass    Stroke (cerebrum) ([place]) 06/03/2017   Type 2 diabetes mellitus with hyperglycemia [facility])      Past Surgical History:  Procedure Laterality Date   TONSILLECTOMY  1943    Social History  Socioeconomic History   Marital status: Married    Spouse name: Not on file   Number of children: Not on file   Years of education: Not on file   Highest education level: Not on file  Occupational History   Not on file  Tobacco Use   Smoking status: Never   Smokeless tobacco: Never  Vaping Use   Vaping Use: Never used  Substance and Sexual Activity   Alcohol use: Yes     Comment: 1 glass of wine per month   Drug use: No   Sexual activity: Not Currently  Other Topics Concern   Not on file  Social History Narrative   Not on file   Social Determinants of Health   Financial Resource Strain: Low Risk  (10/03/2021)   Overall Financial Resource Strain (CARDIA)    Difficulty of Paying Living Expenses: Not hard at all  Food Insecurity: No Food Insecurity (10/03/2021)   Hunger Vital Sign    Worried About Running Out of Food in the Last Year: Never true    [place] in the Last Year: Never true  Transportation Needs: No Transportation Needs (10/03/2021)   PRAPARE - Hydrologist (Medical): No    Lack of Transportation (Non-Medical): No  Physical Activity: Not on file  Stress: No Stress Concern Present (10/03/2021)   [place]    Feeling of Stress : Not at all  Social Connections: Unknown (10/03/2021)   Social Connection and Isolation Panel [NHANES]    Frequency of Communication with Friends and Family: More than three times a week    Frequency of Social Gatherings with Friends and Family: Once a week    Attends Religious Services: Not on Advertising copywriter or Organizations: Not on file    Attends Archivist Meetings: Not on file    Marital Status: Married  Intimate Partner Violence: Not At Risk (10/03/2021)   Humiliation, Afraid, Rape, and Kick questionnaire    Fear of Current or Ex-Partner: No    Emotionally Abused: No    Physically Abused: No    Sexually Abused: No    Family History  Problem Relation Age of Onset   Arthritis Mother    Stroke Father    Hypertension Father    Heart disease Father 75       AMI   Kidney disease Father        bladder ca congenital loss of kidney   Arthritis Maternal Grandmother    Early death Daughter 16       aspiration   Cancer Brother        Scalp   Macular degeneration Brother       Current Outpatient Medications:    acetaminophen (TYLENOL) 500 MG tablet, Take 500 mg by mouth every 6 (six) hours as needed., Disp: , Rfl:    ALPRAZolam (XANAX) 0.5 MG tablet, TAKE 1 TABLET(0.5 MG) BY MOUTH AT BEDTIME AS NEEDED FOR ANXIETY, Disp: 30 tablet, Rfl: 2   Cholecalciferol (VITAMIN D-3) 1000 units CAPS, Take 1,000 Units by mouth daily., Disp: , Rfl:    clopidogrel (PLAVIX) 75 MG tablet, TAKE 1 TABLET(75 MG) BY MOUTH DAILY, Disp: 90 tablet, Rfl: 1   empagliflozin (JARDIANCE) 10 MG TABS tablet, TAKE 1 TABLET(10 MG) BY MOUTH DAILY, Disp: 90 tablet, Rfl: 1   fluticasone (FLONASE) 50 MCG/ACT nasal spray, Place 2 sprays into both nostrils daily  as needed for allergies or rhinitis. , Disp: , Rfl:    hydrocortisone 2.5 % lotion, APPLY TO THE AFFECTED AREA OF FACE EVERY OTHER DAY ALTERNATING WITH KETOCONAZOLE. APPLY TO LOWER LEGS AND ANKLES DAILY TO TWICE DAILY AS NEEDED, Disp: 118 mL, Rfl: 6   ketoconazole (NIZORAL) 2 % cream, APPLY TO THE AFFECTED AREA OF THE FACE EVERY OTHER DAY ALTERNATING WITH HYDROCORTISONE CREAM. USE IN THE GROIN/INNER THIGH DAILY AS NEEDED FOR RASH, Disp: 60 g, Rfl: 6   ketoconazole (NIZORAL) 2 % shampoo, Shampoo into the scalp and face. Let sit 5-10 minutes then wash off. Use 3d/wk., Disp: 120 mL, Rfl: 6   latanoprost (XALATAN) 0.005 % ophthalmic solution, Instill 1 drop into both eyes in the evening, Disp: , Rfl: 0   mirtazapine (REMERON) 15 MG tablet, TAKE 1 TABLET(15 MG) BY MOUTH AT BEDTIME, Disp: 90 tablet, Rfl: 1   mometasone (ELOCON) 0.1 % lotion, Apply to affected areas of the scalp QOD, Disp: 60 mL, Rfl: 6   rosuvastatin (CRESTOR) 10 MG tablet, TAKE 1 TABLET(10 MG) BY MOUTH DAILY, Disp: 90 tablet, Rfl: 3   timolol (TIMOPTIC) 0.5 % ophthalmic solution, Instill 1 drop into the right eye in the morning, Disp: , Rfl: 0   benzonatate (TESSALON) 200 MG capsule, Take 1 capsule (200 mg total) by mouth 3 (three) times daily as needed for cough. (Patient not taking:  Reported on 06/07/2022), Disp: 60 capsule, Rfl: 1   NYSTATIN powder, APPLY TOPICALLY TWICE A DAY AS NEEDED FOR RASH (Patient not taking: Reported on 11/23/2021), Disp: 60 g, Rfl: 2  Physical exam:  Vitals:   06/07/22 1328  BP: (!) 160/77  Pulse: 67  Resp: 18  Temp: 97.7 F (36.5 C)  TempSrc: Tympanic  SpO2: 98%  Weight: 133 lb 6.4 oz (60.5 kg)  Height: 5' 9" (1.753 m)   Physical Exam Cardiovascular:     Rate and Rhythm: Normal rate and regular rhythm.     Heart sounds: Normal heart sounds.  Pulmonary:     Effort: Pulmonary effort is normal.     Breath sounds: Normal breath sounds.  Skin:    General: Skin is warm and dry.  Neurological:     Mental Status: He is alert and oriented to person, place, and time.         Latest Ref Rng & Units 11/09/2021    3:41 PM  CMP  Glucose 65 - 99 mg/dL 169   BUN 7 - 25 mg/dL 19   Creatinine 0.70 - 1.22 mg/dL 1.52   Sodium 135 - 146 mmol/L 142   Potassium 3.5 - 5.3 mmol/L 3.8   Chloride 98 - 110 mmol/L 107   CO2 20 - 32 mmol/L 22   Calcium 8.6 - 10.3 mg/dL 9.7   Total Protein 6.1 - 8.1 g/dL 6.9   Total Bilirubin 0.2 - 1.2 mg/dL 0.9   AST 10 - 35 U/L 25   ALT 9 - 46 U/L 21       Latest Ref Rng & Units 06/07/2022    1:18 PM  CBC  WBC 4.0 - 10.5 K/uL 5.7   Hemoglobin 13.0 - 17.0 g/dL 15.6   Hematocrit 39.0 - 52.0 % 45.4   Platelets 150 - 400 K/uL 166      Assessment and plan- Patient is a 86 y.o. male with secondary polycythemia here for routine follow-up  Patient's hematocrit was close to 54-55 last year.  Presently it remains less than 50 with a hemoglobin  that has remained stable around 15.  He does not require phlebotomy at this time.  I will repeat CBC in 6 months in 1 year and see him back in 1 year   Visit Diagnosis 1. Polycythemia, secondary      Dr. Randa Evens, MD, MPH [facility] [phone] 06/07/2022 1:38 PM

## 2022-06-07 NOTE — Addendum Note (Signed)
Addended by: Oneida Arenas on: 06/07/2022 02:01 PM   Modules accepted: Orders

## 2022-06-09 ENCOUNTER — Other Ambulatory Visit: Payer: Self-pay | Admitting: Internal Medicine

## 2022-07-05 DIAGNOSIS — H2513 Age-related nuclear cataract, bilateral: Secondary | ICD-10-CM | POA: Diagnosis not present

## 2022-07-05 DIAGNOSIS — E119 Type 2 diabetes mellitus without complications: Secondary | ICD-10-CM | POA: Diagnosis not present

## 2022-07-05 DIAGNOSIS — H353131 Nonexudative age-related macular degeneration, bilateral, early dry stage: Secondary | ICD-10-CM | POA: Diagnosis not present

## 2022-07-05 DIAGNOSIS — H40003 Preglaucoma, unspecified, bilateral: Secondary | ICD-10-CM | POA: Diagnosis not present

## 2022-07-05 LAB — HM DIABETES EYE EXAM

## 2022-07-16 ENCOUNTER — Other Ambulatory Visit: Payer: Self-pay | Admitting: Internal Medicine

## 2022-07-29 ENCOUNTER — Ambulatory Visit: Payer: Medicare PPO | Admitting: Dermatology

## 2022-07-31 ENCOUNTER — Ambulatory Visit: Payer: Medicare PPO | Admitting: Dermatology

## 2022-07-31 DIAGNOSIS — L821 Other seborrheic keratosis: Secondary | ICD-10-CM | POA: Diagnosis not present

## 2022-07-31 DIAGNOSIS — L570 Actinic keratosis: Secondary | ICD-10-CM | POA: Diagnosis not present

## 2022-07-31 DIAGNOSIS — D170 Benign lipomatous neoplasm of skin and subcutaneous tissue of head, face and neck: Secondary | ICD-10-CM | POA: Diagnosis not present

## 2022-07-31 DIAGNOSIS — L739 Follicular disorder, unspecified: Secondary | ICD-10-CM

## 2022-07-31 DIAGNOSIS — D229 Melanocytic nevi, unspecified: Secondary | ICD-10-CM

## 2022-07-31 DIAGNOSIS — Z1283 Encounter for screening for malignant neoplasm of skin: Secondary | ICD-10-CM

## 2022-07-31 DIAGNOSIS — L578 Other skin changes due to chronic exposure to nonionizing radiation: Secondary | ICD-10-CM | POA: Diagnosis not present

## 2022-07-31 DIAGNOSIS — L219 Seborrheic dermatitis, unspecified: Secondary | ICD-10-CM

## 2022-07-31 DIAGNOSIS — L814 Other melanin hyperpigmentation: Secondary | ICD-10-CM | POA: Diagnosis not present

## 2022-07-31 DIAGNOSIS — Z79899 Other long term (current) drug therapy: Secondary | ICD-10-CM

## 2022-07-31 DIAGNOSIS — Z85828 Personal history of other malignant neoplasm of skin: Secondary | ICD-10-CM

## 2022-07-31 MED ORDER — CLINDAMYCIN PHOSPHATE 1 % EX LOTN: TOPICAL_LOTION | CUTANEOUS | 6 refills | Status: DC

## 2022-07-31 MED ORDER — MOMETASONE FUROATE 0.1 % EX SOLN: CUTANEOUS | 6 refills | Status: DC

## 2022-07-31 MED ORDER — KETOCONAZOLE 2 % EX CREA: TOPICAL_CREAM | CUTANEOUS | 6 refills | Status: DC

## 2022-07-31 MED ORDER — HYDROCORTISONE 2.5 % EX LOTN: TOPICAL_LOTION | CUTANEOUS | 6 refills | Status: DC

## 2022-07-31 MED ORDER — KETOCONAZOLE 2 % EX SHAM: MEDICATED_SHAMPOO | CUTANEOUS | 6 refills | Status: DC

## 2022-07-31 NOTE — Patient Instructions (Signed)
Due to recent changes in healthcare laws, you may see results of your pathology and/or laboratory studies on MyChart before the doctors have had a chance to review them. We understand that in some cases there may be results that are confusing or concerning to you. Please understand that not all results are received at the same time and often the doctors may need to interpret multiple results in order to provide you with the best plan of care or course of treatment. Therefore, we ask that you please give us 2 business days to thoroughly review all your results before contacting the office for clarification. Should we see a critical lab result, you will be contacted sooner.   If You Need Anything After Your Visit  If you have any questions or concerns for your doctor, please call our main line at [phone] and press option 4 to reach your doctor's medical assistant. If no one answers, please leave a voicemail as directed and we will return your call as soon as possible. Messages left after 4 pm will be answered the following business day.   You may also send us a message via MyChart. We typically respond to MyChart messages within 1-2 business days.  For prescription refills, please ask your pharmacy to contact our office. Our fax number is [phone].  If you have an urgent issue when the clinic is closed that cannot wait until the next business day, you can page your doctor at the number below.    Please note that while we do our best to be available for urgent issues outside of office hours, we are not available 24/7.   If you have an urgent issue and are unable to reach us, you may choose to seek medical care at your doctor's office, retail clinic, urgent care center, or emergency room.  If you have a medical emergency, please immediately call 911 or go to the emergency department.  Pager Numbers  - Dr. Kowalski: [phone]  - Dr. Moye: [phone]  - Dr. Stewart:  [phone]  In the event of inclement weather, please call our main line at [phone] for an update on the status of any delays or closures.  Dermatology Medication Tips: Please keep the boxes that topical medications come in in order to help keep track of the instructions about where and how to use these. Pharmacies typically print the medication instructions only on the boxes and not directly on the medication tubes.   If your medication is too expensive, please contact our office at [phone] option 4 or send us a message through MyChart.   We are unable to tell what your co-pay for medications will be in advance as this is different depending on your insurance coverage. However, we may be able to find a substitute medication at lower cost or fill out paperwork to get insurance to cover a needed medication.   If a prior authorization is required to get your medication covered by your insurance company, please allow us 1-2 business days to complete this process.  Drug prices often vary depending on where the prescription is filled and some pharmacies may offer cheaper prices.  The website www.goodrx.com contains coupons for medications through different pharmacies. The prices here do not account for what the cost may be with help from insurance (it may be cheaper with your insurance), but the website can give you the price if you did not use any insurance.  - You can print the associated coupon and take it with   your prescription to the pharmacy.  - You may also stop by our office during regular business hours and pick up a GoodRx coupon card.  - If you need your prescription sent electronically to a different pharmacy, notify our office through [place] MyChart or by phone at [phone] option 4.     Si Usted Necesita Algo Despus de Su Visita  Tambin puede enviarnos un mensaje a travs de MyChart. Por lo general respondemos a los mensajes de MyChart en el transcurso de 1 a 2  das hbiles.  Para renovar recetas, por favor pida a su farmacia que se ponga en contacto con nuestra oficina. Nuestro nmero de fax es el [phone].  Si tiene un asunto urgente cuando la clnica est cerrada y que no puede esperar hasta el siguiente da hbil, puede llamar/localizar a su doctor(a) al nmero que aparece a continuacin.   Por favor, tenga en cuenta que aunque hacemos todo lo posible para estar disponibles para asuntos urgentes fuera del horario de oficina, no estamos disponibles las 24 horas del da, los 7 das de la semana.   Si tiene un problema urgente y no puede comunicarse con nosotros, puede optar por buscar atencin mdica  en el consultorio de su doctor(a), en una clnica privada, en un centro de atencin urgente o en una sala de emergencias.  Si tiene una emergencia mdica, por favor llame inmediatamente al 911 o vaya a la sala de emergencias.  Nmeros de bper  - Dr. Kowalski: [phone]  - Dra. Moye: [phone]  - Dra. Stewart: [phone]  En caso de inclemencias del tiempo, por favor llame a nuestra lnea principal al [phone] para una actualizacin sobre el estado de cualquier retraso o cierre.  Consejos para la medicacin en dermatologa: Por favor, guarde las cajas en las que vienen los medicamentos de uso tpico para ayudarle a seguir las instrucciones sobre dnde y cmo usarlos. Las farmacias generalmente imprimen las instrucciones del medicamento slo en las cajas y no directamente en los tubos del medicamento.   Si su medicamento es muy caro, por favor, pngase en contacto con nuestra oficina llamando al [phone] y presione la opcin 4 o envenos un mensaje a travs de MyChart.   No podemos decirle cul ser su copago por los medicamentos por adelantado ya que esto es diferente dependiendo de la cobertura de su seguro. Sin embargo, es posible que podamos encontrar un medicamento sustituto a menor costo o llenar un formulario para que el  seguro cubra el medicamento que se considera necesario.   Si se requiere una autorizacin previa para que su compaa de seguros cubra su medicamento, por favor permtanos de 1 a 2 das hbiles para completar este proceso.  Los precios de los medicamentos varan con frecuencia dependiendo del lugar de dnde se surte la receta y alguna farmacias pueden ofrecer precios ms baratos.  El sitio web www.goodrx.com tiene cupones para medicamentos de diferentes farmacias. Los precios aqu no tienen en cuenta lo que podra costar con la ayuda del seguro (puede ser ms barato con su seguro), pero el sitio web puede darle el precio si no utiliz ningn seguro.  - Puede imprimir el cupn correspondiente y llevarlo con su receta a la farmacia.  - Tambin puede pasar por nuestra oficina durante el horario de atencin regular y recoger una tarjeta de cupones de GoodRx.  - Si necesita que su receta se enve electrnicamente a una farmacia diferente, informe a nuestra oficina a travs de MyChart de [place]   o por telfono llamando al [phone] y presione la opcin 4.  

## 2022-07-31 NOTE — Progress Notes (Signed)
Follow-Up Visit   Subjective  Anthony Allen is a 87 y.o. male who presents for the following: Annual Exam. Hx of BCC The patient presents for Total-Body Skin Exam (TBSE) for skin cancer screening and mole check.  The patient has spots, moles and lesions to be evaluated, some may be new or changing and the patient has concerns that these could be cancer.   Wife with patient   The following portions of the chart were reviewed this encounter and updated as appropriate:   Tobacco  Allergies  Meds  Problems  Med Hx  Surg Hx  Fam Hx     Review of Systems:  No other skin or systemic complaints except as noted in HPI or Assessment and Plan.  Objective  Well appearing patient in no apparent distress; mood and affect are within normal limits.  A full examination was performed including scalp, head, eyes, ears, nose, lips, neck, chest, axillae, abdomen, back, buttocks, bilateral upper extremities, bilateral lower extremities, hands, feet, fingers, toes, fingernails, and toenails. All findings within normal limits unless otherwise noted below.  right nose Erythematous thin papules/macules with gritty scale.   forehead Subcutaneous nodule.   neck and shoulders folliculitis   scalp Pink patches with greasy scale.    Assessment & Plan  AK (actinic keratosis) right nose Actinic keratoses are precancerous spots that appear secondary to cumulative UV radiation exposure/sun exposure over time. They are chronic with expected duration over 1 year. A portion of actinic keratoses will progress to squamous cell carcinoma of the skin. It is not possible to reliably predict which spots will progress to skin cancer and so treatment is recommended to prevent development of skin cancer.  Recommend daily broad spectrum sunscreen SPF 30+ to sun-exposed areas, reapply every 2 hours as needed.  Recommend staying in the shade or wearing long sleeves, sun glasses (UVA+UVB protection) and wide brim  hats (4-inch brim around the entire circumference of the hat). Call for new or changing lesions.   Destruction of lesion - right nose Complexity: simple   Destruction method: cryotherapy   Informed consent: discussed and consent obtained   Timeout:  patient name, date of birth, surgical site, and procedure verified Lesion destroyed using liquid nitrogen: Yes   Region frozen until ice ball extended beyond lesion: Yes   Outcome: patient tolerated procedure well with no complications   Post-procedure details: wound care instructions given    Lipoma of head forehead Present for many years. Benign-appearing.  Observation.  Call clinic for new or changing lesions.  Recommend daily use of broad spectrum spf 30+ sunscreen to sun-exposed areas.   Folliculitis neck and shoulders Folliculitis with LSC  Cont Clindamycin lotion qd to back, let sit 5 minutes and rinse off Chronic and persistent condition with duration or expected duration over one year. Condition is symptomatic/ bothersome to patient. Not currently at goal. clindamycin (CLEOCIN-T) 1 % lotion - neck, shoulders Apply topically daily. Apply to neck and shoulders daily   clindamycin (CLEOCIN-T) 1 % lotion - neck and shoulders Apply qd to back, let sit 5 minutes and rinse off  Seborrheic dermatitis scalp Seborrheic Dermatitis  -  is a chronic persistent rash characterized by pinkness and scaling most commonly of the mid face but also can occur on the scalp (dandruff), ears; mid chest, mid back and groin.  It tends to be exacerbated by stress and cooler weather.  People who have neurologic disease may experience new onset or exacerbation of existing seborrheic dermatitis.  The condition is not curable but treatable and can be controlled.   Continue Mometasone solution to aa's scalp QOD or 4d/wk PRN.  Continue Ketoconazole 2% cream QOD alternating with HC 2.5% cream QOD.  Cont Ketoconazole 2% shampoo let sit 5-10 minutes before  washing off. Use 3d/wk.   Related Medications mometasone (ELOCON) 0.1 % lotion Apply to affected areas of the scalp QOD ketoconazole (NIZORAL) 2 % shampoo Shampoo into the scalp and face. Let sit 5-10 minutes then wash off. Use 3d/wk. ketoconazole (NIZORAL) 2 % cream APPLY TO THE AFFECTED AREA OF THE FACE EVERY OTHER DAY ALTERNATING WITH HYDROCORTISONE CREAM. USE IN THE GROIN/INNER THIGH DAILY AS NEEDED FOR RASH hydrocortisone 2.5 % lotion APPLY TO THE AFFECTED AREA OF FACE EVERY OTHER DAY ALTERNATING WITH KETOCONAZOLE. APPLY TO LOWER LEGS AND ANKLES DAILY TO TWICE DAILY AS NEEDED  Lentigines - Scattered tan macules - Due to sun exposure - Benign-appearing, observe - Recommend daily broad spectrum sunscreen SPF 30+ to sun-exposed areas, reapply every 2 hours as needed. - Call for any changes  Seborrheic Keratoses - Stuck-on, waxy, tan-brown papules and/or plaques  - Benign-appearing - Discussed benign etiology and prognosis. - Observe - Call for any changes  Melanocytic Nevi - Tan-brown and/or pink-flesh-colored symmetric macules and papules - Benign appearing on exam today - Observation - Call clinic for new or changing moles - Recommend daily use of broad spectrum spf 30+ sunscreen to sun-exposed areas.   Hemangiomas - Red papules - Discussed benign nature - Observe - Call for any changes  Actinic Damage - Chronic condition, secondary to cumulative UV/sun exposure - diffuse scaly erythematous macules with underlying dyspigmentation - Recommend daily broad spectrum sunscreen SPF 30+ to sun-exposed areas, reapply every 2 hours as needed.  - Staying in the shade or wearing long sleeves, sun glasses (UVA+UVB protection) and wide brim hats (4-inch brim around the entire circumference of the hat) are also recommended for sun protection.  - Call for new or changing lesions.  History of Basal Cell Carcinoma of the Skin - No evidence of recurrence today - Recommend regular  full body skin exams - Recommend daily broad spectrum sunscreen SPF 30+ to sun-exposed areas, reapply every 2 hours as needed.  - Call if any new or changing lesions are noted between office visits   Skin cancer screening performed today.   Return in about 1 year (around 08/01/2023) for TBSE, hx of BCC .  IMarye Round, CMA, am acting as scribe for Sarina Ser, MD .  Documentation: I have reviewed the above documentation for accuracy and completeness, and I agree with the above.  Sarina Ser, MD

## 2022-08-05 ENCOUNTER — Encounter: Payer: Self-pay | Admitting: Dermatology

## 2022-08-12 ENCOUNTER — Other Ambulatory Visit: Payer: Self-pay | Admitting: Internal Medicine

## 2022-08-14 MED ORDER — ALPRAZOLAM 0.5 MG PO TABS: ORAL_TABLET | ORAL | 0 refills | Status: DC

## 2022-08-14 NOTE — Addendum Note (Signed)
Addended by: Crecencio Mc on: 08/14/2022 12:40 PM   Modules accepted: Orders

## 2022-08-14 NOTE — Telephone Encounter (Signed)
Alprazolam has been refilled

## 2022-08-21 ENCOUNTER — Ambulatory Visit: Payer: Medicare PPO | Admitting: Internal Medicine

## 2022-08-21 ENCOUNTER — Encounter: Payer: Self-pay | Admitting: Internal Medicine

## 2022-08-21 VITALS — BP 114/62 | HR 76 | Temp 97.6°F | Ht 69.0 in | Wt 133.8 lb

## 2022-08-21 DIAGNOSIS — E1122 Type 2 diabetes mellitus with diabetic chronic kidney disease: Secondary | ICD-10-CM

## 2022-08-21 DIAGNOSIS — D170 Benign lipomatous neoplasm of skin and subcutaneous tissue of head, face and neck: Secondary | ICD-10-CM | POA: Diagnosis not present

## 2022-08-21 DIAGNOSIS — F5105 Insomnia due to other mental disorder: Secondary | ICD-10-CM | POA: Diagnosis not present

## 2022-08-21 DIAGNOSIS — D751 Secondary polycythemia: Secondary | ICD-10-CM

## 2022-08-21 DIAGNOSIS — I700 Atherosclerosis of aorta: Secondary | ICD-10-CM

## 2022-08-21 DIAGNOSIS — E1169 Type 2 diabetes mellitus with other specified complication: Secondary | ICD-10-CM | POA: Diagnosis not present

## 2022-08-21 DIAGNOSIS — Z79899 Other long term (current) drug therapy: Secondary | ICD-10-CM | POA: Diagnosis not present

## 2022-08-21 DIAGNOSIS — N1831 Chronic kidney disease, stage 3a: Secondary | ICD-10-CM

## 2022-08-21 DIAGNOSIS — I6930 Unspecified sequelae of cerebral infarction: Secondary | ICD-10-CM

## 2022-08-21 DIAGNOSIS — E785 Hyperlipidemia, unspecified: Secondary | ICD-10-CM | POA: Diagnosis not present

## 2022-08-21 DIAGNOSIS — I69351 Hemiplegia and hemiparesis following cerebral infarction affecting right dominant side: Secondary | ICD-10-CM | POA: Diagnosis not present

## 2022-08-21 DIAGNOSIS — F409 Phobic anxiety disorder, unspecified: Secondary | ICD-10-CM

## 2022-08-21 DIAGNOSIS — R634 Abnormal weight loss: Secondary | ICD-10-CM

## 2022-08-21 DIAGNOSIS — E1121 Type 2 diabetes mellitus with diabetic nephropathy: Secondary | ICD-10-CM

## 2022-08-21 DIAGNOSIS — L03031 Cellulitis of right toe: Secondary | ICD-10-CM

## 2022-08-21 DIAGNOSIS — I672 Cerebral atherosclerosis: Secondary | ICD-10-CM

## 2022-08-21 MED ORDER — ALPRAZOLAM 0.5 MG PO TABS: ORAL_TABLET | ORAL | 5 refills | Status: DC

## 2022-08-21 MED ORDER — CEPHALEXIN 500 MG PO CAPS: 500.0000 mg | ORAL_CAPSULE | Freq: Four times a day (QID) | ORAL | 0 refills | Status: DC

## 2022-08-21 MED ORDER — MIRTAZAPINE 15 MG PO TABS: ORAL_TABLET | ORAL | 1 refills | Status: DC

## 2022-08-21 MED ORDER — EMPAGLIFLOZIN 10 MG PO TABS: ORAL_TABLET | ORAL | 1 refills | Status: DC

## 2022-08-21 NOTE — Patient Instructions (Addendum)
Your Medicare Annual Wellness visit is due in April 2024. Please schedule this appointment at checkout.    Aadam has lost more weight.   Add a protein shake to  Zavier's milk shakes:   try  the premier protein or Equate version   Cephalexin 3-4 times daily for paronychia (cuticle infection)   Please take a probiotic ( Align, Floraque or Culturelle), the generic version of one of these over the counter medications, for a minimum of 3 weeks to prevent a serious antibiotic associated diarrhea  Called clostridium dificile colitis.  Taking a probiotic may also prevent vaginitis due to yeast infections and can be continued indefinitely if you feel that it improves your digestion or your elimination (bowels).

## 2022-08-21 NOTE — Progress Notes (Signed)
Subjective:  Patient ID: Anthony Allen, male    DOB: 1936/05/18  Age: 87 y.o. MRN: [id number]  CC: The primary encounter diagnosis was Hyperlipidemia associated with type 2 diabetes mellitus ([place]). Diagnoses of Type 2 diabetes mellitus with diabetic chronic kidney disease, unspecified CKD stage, unspecified whether long term insulin use ([place]), Long-term use of high-risk medication, Polycythemia, secondary, Paronychia of great toe of right foot, Lipoma of forehead, Late effects of cerebral ischemic stroke, Hemiplegia and hemiparesis following cerebral infarction affecting right dominant side (HCC), Insomnia due to anxiety and fear, Aortic atherosclerosis ([place]), Stage 3a chronic kidney disease ([place]), Cerebrovascular disease, arteriosclerotic, post-stroke, and Unintentional weight loss of less than 10% body weight within 6 months were also pertinent to this visit.   HPI Anthony Allen presents for  Chief Complaint  Patient presents with   Medical Management of Chronic Issues    Diabetes, medication refills    T2DM:  he  feels generally well,  But is not  exercising regularly due to disabling CVA .  Has had a change in taste ("everything tastes like cardboard)"  .  He Has lost several teeth in the last year and has to eat soft foods or foods chopped very small. . Checking  blood sugars less than once daily at variable times, usually only if he feels he may be having a hypoglycemic event. .  BS have been under 130 fasting and < 150 post prandially.  Denies any recent hypoglyemic events.  Taking  Jardiance s as directed. Following a carbohydrate modified diet 6 days per week. Denies numbness, burning and tingling of extremities. Appetite is fair, but he has lost 10 lbs (< 10% )since June 2023.   Outpatient Medications Prior to Visit  Medication Sig Dispense Refill   acetaminophen (TYLENOL) 500 MG tablet Take 500 mg by mouth every 6 (six) hours as needed.     Cholecalciferol (VITAMIN D-3)  1000 units CAPS Take 1,000 Units by mouth daily.     clindamycin (CLEOCIN-T) 1 % lotion Apply qd to back, let sit 5 minutes and rinse off 60 mL 6   clopidogrel (PLAVIX) 75 MG tablet TAKE 1 TABLET(75 MG) BY MOUTH DAILY 90 tablet 1   fluticasone (FLONASE) 50 MCG/ACT nasal spray Place 2 sprays into both nostrils daily as needed for allergies or rhinitis.      hydrocortisone 2.5 % lotion APPLY TO THE AFFECTED AREA OF FACE EVERY OTHER DAY ALTERNATING WITH KETOCONAZOLE. APPLY TO LOWER LEGS AND ANKLES DAILY TO TWICE DAILY AS NEEDED 118 mL 6   ketoconazole (NIZORAL) 2 % cream APPLY TO THE AFFECTED AREA OF THE FACE EVERY OTHER DAY ALTERNATING WITH HYDROCORTISONE CREAM. USE IN THE GROIN/INNER THIGH DAILY AS NEEDED FOR RASH 60 g 6   ketoconazole (NIZORAL) 2 % shampoo Shampoo into the scalp and face. Let sit 5-10 minutes then wash off. Use 3d/wk. 120 mL 6   latanoprost (XALATAN) 0.005 % ophthalmic solution Instill 1 drop into both eyes in the evening  0   mometasone (ELOCON) 0.1 % lotion Apply to affected areas of the scalp QOD 60 mL 6   NYSTATIN powder APPLY TOPICALLY TWICE A DAY AS NEEDED FOR RASH 60 g 2   rosuvastatin (CRESTOR) 10 MG tablet TAKE 1 TABLET(10 MG) BY MOUTH DAILY 90 tablet 3   timolol (TIMOPTIC) 0.5 % ophthalmic solution Instill 1 drop into the right eye in the morning  0   ALPRAZolam (XANAX) 0.5 MG tablet TAKE 1 TABLET(0.5 MG) BY MOUTH  AT BEDTIME AS NEEDED FOR ANXIETY 30 tablet 0   empagliflozin (JARDIANCE) 10 MG TABS tablet TAKE 1 TABLET(10 MG) BY MOUTH DAILY 90 tablet 1   mirtazapine (REMERON) 15 MG tablet TAKE 1 TABLET(15 MG) BY MOUTH AT BEDTIME 90 tablet 1   benzonatate (TESSALON) 200 MG capsule Take 1 capsule (200 mg total) by mouth 3 (three) times daily as needed for cough. (Patient not taking: Reported on 06/07/2022) 60 capsule 1   No facility-administered medications prior to visit.    Review of Systems;  Patient denies headache, fevers, malaise, unintentional weight loss, skin  rash, eye pain, sinus congestion and sinus pain, sore throat, dysphagia,  hemoptysis , cough, dyspnea, wheezing, chest pain, palpitations, orthopnea, edema, abdominal pain, nausea, melena, diarrhea, constipation, flank pain, dysuria, hematuria, urinary  Frequency, nocturia, numbness, tingling, seizures,  Focal weakness, Loss of consciousness,  Tremor, insomnia, depression, anxiety, and suicidal ideation.      Objective:  BP 114/62   Pulse 76   Temp 97.6 F (36.4 C) (Oral)   Ht 5\' 9"  (1.753 m)   Wt 133 lb 12.8 oz (60.7 kg)   SpO2 96%   BMI 19.76 kg/m   BP Readings from Last 3 Encounters:  08/21/22 114/62  06/07/22 (!) 160/77  11/23/21 (!) 157/84    Wt Readings from Last 3 Encounters:  08/27/22 173 lb (78.5 kg)  08/21/22 133 lb 12.8 oz (60.7 kg)  06/07/22 133 lb 6.4 oz (60.5 kg)    Physical Exam  Lab Results  Component Value Date   HGBA1C 6.1 08/21/2022   HGBA1C 6.4 (H) 11/09/2021   HGBA1C 6.1 (H) 05/11/2021    Lab Results  Component Value Date   CREATININE 1.62 (H) 08/21/2022   CREATININE 1.52 (H) 11/09/2021   CREATININE 1.49 (H) 05/11/2021    Lab Results  Component Value Date   WBC 5.7 06/07/2022   HGB 15.6 06/07/2022   HCT 45.4 06/07/2022   PLT 166 06/07/2022   GLUCOSE 134 (H) 08/21/2022   CHOL 91 08/21/2022   TRIG 155.0 (H) 08/21/2022   HDL 35.30 (L) 08/21/2022   LDLDIRECT 30.0 08/21/2022   LDLCALC 24 08/21/2022   ALT 15 08/21/2022   AST 20 08/21/2022   NA 140 08/21/2022   K 4.2 08/21/2022   CL 103 08/21/2022   CREATININE 1.62 (H) 08/21/2022   BUN 20 08/21/2022   CO2 28 08/21/2022   TSH 2.16 08/21/2022   PSA 1.52 06/23/2013   INR 1.05 06/03/2017   HGBA1C 6.1 08/21/2022   MICROALBUR 6.4 (H) 11/12/2021    MR FACE/TRIGEMINAL WO/W CM  Result Date: 10/28/2017 CLINICAL DATA:  Left parapharyngeal mass. EXAM: MRI FACE TRIGEMINAL WITHOUT AND WITH CONTRAST TECHNIQUE: Multiplanar, multiecho pulse sequences of the face and surrounding structures,  including thin slice imaging of the course of the Trigeminal Nerves, were obtained both before and after administration of intravenous contrast. CONTRAST:  2mL MULTIHANCE GADOBENATE DIMEGLUMINE 529 MG/ML IV SOLN COMPARISON:  Brain MRI 06/01/2017 FINDINGS: There are old infarcts of the right corona radiata and left pons. The visualized intracranial contents are otherwise unremarkable. 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 There is no extension into the skull base. The lesion measures approximately 4.0 x 0.7 x 2.3 cm. There is no other skull base abnormality. Dedicated trigeminal nerve imaging was also performed. There is no focal abnormality along the trigeminal nerves. Meckel's cave is patent bilaterally.  No abnormality at the foramina rotundum or ovale. IMPRESSION: 1. 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 Histologic sampling should be considered. 2. No extension through the skull base. Normal appearance of the visible cranial nerves. 3. 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 Electronically Signed   By: Ulyses Jarred M.D.   On: 10/28/2017 15:44    Assessment & Plan:  .Hyperlipidemia associated with type 2 diabetes mellitus ([place]) Assessment & Plan: Historically well-controlled on Jardiance.  hemoglobin A1c is < 6.5.  Patient  is up-to-date on eye exams and foot exam is normal today  except for right toe paronychia. .Patient is tolerating statin therapy for CAD risk reduction and ACE/ARB has been suspended due to resolution of hypertension with wieght loss resulting in hypotension,  He has mild microalbuminuria   Lab Results   Component Value Date   HGBA1C 6.1 08/21/2022    No results found for: "LIPASE" Lab Results  Component Value Date   CHOL 91 08/21/2022   HDL 35.30 (L) 08/21/2022   LDLCALC 24 08/21/2022   LDLDIRECT 30.0 08/21/2022   TRIG 155.0 (H) 08/21/2022   CHOLHDL 3 08/21/2022    Lab Results  Component Value Date   MICROALBUR 6.4 (H) 11/12/2021   MICROALBUR 6.0 (H) 11/10/2020      Orders: -     Lipid panel -     LDL cholesterol, direct  Type 2 diabetes mellitus with diabetic chronic kidney disease, unspecified CKD stage, unspecified whether long term insulin use (HCC) -     Hemoglobin A1c -     Comprehensive metabolic panel -     Microalbumin / creatinine urine ratio; Future -     Losartan Potassium; Take 1 tablet (25 mg total) by mouth daily.  Dispense: 90 tablet; Refill: 0 -     Basic metabolic panel; Future  Long-term use of high-risk medication -     TSH  Polycythemia, secondary Assessment & Plan: With history of pontine CVA when hgb was 18.8   Jak 2 mutation screen was negative. He is receiving phlebotomy for hgb > 18  Lab Results  Component Value Date   WBC 5.7 06/07/2022   HGB 15.6 06/07/2022   HCT 45.4 06/07/2022   MCV 93.2 06/07/2022   PLT 166 06/07/2022      Paronychia of great toe of right foot Assessment & Plan: Noted on on right great toe.  rx cephalexin 500 mg qid x 7 days,  Epsom salt soaks for ten mintues 3 tims daily.  Probiotics daily for 3 weeks   Lipoma of forehead Assessment & Plan: Discussed referral to General Surgeon ; deferred    Late effects of cerebral ischemic stroke  Hemiplegia and hemiparesis following cerebral infarction affecting right dominant side [facility]) Assessment & Plan: He continues to require minimal assistance with ADLs due to right sided hemiplegia .  He uses a walker in the house and a wheelchair outside of the home for distances > 20 feet.    Insomnia due to anxiety and fear Assessment & Plan: Continue use of   remeron   Aortic atherosclerosis ([place]) Assessment & Plan: Noted on 2014 CT .  Reviewed findings of prior CT scan today..  Patient is tolerating high potency statin therapy   Orders: -  Basic metabolic panel; Future  Stage 3a chronic kidney disease ([place]) Assessment & Plan: Secondary to DM and hypertension.  Avoiding NSAIDs.  Lytes,  PTH have been normal   Orders: -     Losartan Potassium; Take 1 tablet (25 mg total) by mouth daily.  Dispense: 90 tablet; Refill: 0 -     Basic metabolic panel; Future  Cerebrovascular disease, arteriosclerotic, post-stroke Assessment & Plan: Continue ASA, plavix and rosuvastatin    Unintentional weight loss of less than 10% body weight within 6 months Assessment & Plan: Secondary to change in taste and loss of teeth required pureed or chopped foods..   I have reviewed his diet and recommended that he increase her protein and fat intake  using protein shakes while monitoring his carbohydrates.     Other orders -     Empagliflozin; TAKE 1 TABLET(10 MG) BY MOUTH DAILY  Dispense: 90 tablet; Refill: 1 -     Mirtazapine; TAKE 1 TABLET(15 MG) BY MOUTH AT BEDTIME  Dispense: 90 tablet; Refill: 1 -     ALPRAZolam; TAKE 1 TABLET(0.5 MG) BY MOUTH AT BEDTIME AS NEEDED FOR ANXIETY  Dispense: 30 tablet; Refill: 5        Follow-up: Return in about 6 months (around 02/21/2023).   Crecencio Mc, MD

## 2022-08-22 LAB — COMPREHENSIVE METABOLIC PANEL
ALT: 15 U/L (ref 0–53)
AST: 20 U/L (ref 0–37)
Albumin: 4.2 g/dL (ref 3.5–5.2)
Alkaline Phosphatase: 80 U/L (ref 39–117)
BUN: 20 mg/dL (ref 6–23)
CO2: 28 mEq/L (ref 19–32)
Calcium: 9.8 mg/dL (ref 8.4–10.5)
Chloride: 103 mEq/L (ref 96–112)
Creatinine, Ser: 1.62 mg/dL — ABNORMAL HIGH (ref 0.40–1.50)
GFR: 38.75 mL/min — ABNORMAL LOW (ref >60.00)
Glucose, Bld: 134 mg/dL — ABNORMAL HIGH (ref 70–99)
Potassium: 4.2 mEq/L (ref 3.5–5.1)
Sodium: 140 mEq/L (ref 135–145)
Total Bilirubin: 1.2 mg/dL (ref 0.2–1.2)
Total Protein: 7.1 g/dL (ref 6.0–8.3)

## 2022-08-22 LAB — LDL CHOLESTEROL, DIRECT: Direct LDL: 30 mg/dL

## 2022-08-22 LAB — TSH: TSH: 2.16 u[IU]/mL (ref 0.35–5.50)

## 2022-08-22 LAB — LIPID PANEL
Cholesterol: 91 mg/dL (ref 0–200)
HDL: 35.3 mg/dL — ABNORMAL LOW (ref >39.00)
LDL Cholesterol: 24 mg/dL (ref 0–99)
NonHDL: 55.32
Total CHOL/HDL Ratio: 3
Triglycerides: 155 mg/dL — ABNORMAL HIGH (ref 0.0–149.0)
VLDL: 31 mg/dL (ref 0.0–40.0)

## 2022-08-22 LAB — HEMOGLOBIN A1C: Hgb A1c MFr Bld: 6.1 % (ref 4.6–6.5)

## 2022-08-23 DIAGNOSIS — E1122 Type 2 diabetes mellitus with diabetic chronic kidney disease: Secondary | ICD-10-CM | POA: Diagnosis not present

## 2022-08-24 NOTE — Assessment & Plan Note (Signed)
Continue ASA, plavix and rosuvastatin

## 2022-08-24 NOTE — Assessment & Plan Note (Signed)
Continue use of  remeron

## 2022-08-24 NOTE — Assessment & Plan Note (Signed)
Secondary to change in taste and loss of teeth required pureed or chopped foods..   I have reviewed his diet and recommended that he increase her protein and fat intake  using protein shakes while monitoring his carbohydrates.

## 2022-08-24 NOTE — Assessment & Plan Note (Deleted)
He continues to require assistance with ADLs due to right sided hemiparesis.  He uses a walker in the house and a wheelchair outside of the home for distances > 20 feet.  

## 2022-08-24 NOTE — Assessment & Plan Note (Signed)
Secondary to DM and hypertension.  Avoiding NSAIDs.  Lytes,  PTH have been normal  

## 2022-08-24 NOTE — Assessment & Plan Note (Addendum)
He continues to require minimal assistance with ADLs due to right sided hemiplegia .  He uses a walker in the house and a wheelchair outside of the home for distances > 20 feet.

## 2022-08-24 NOTE — Assessment & Plan Note (Signed)
Noted on 2014 CT .  Reviewed findings of prior CT scan today..  Patient is tolerating high potency statin therapy  

## 2022-08-24 NOTE — Assessment & Plan Note (Signed)
With history of pontine CVA when hgb was 18.8   Jak 2 mutation screen was negative. He is receiving phlebotomy for hgb > 18  Lab Results  Component Value Date   WBC 5.7 06/07/2022   HGB 15.6 06/07/2022   HCT 45.4 06/07/2022   MCV 93.2 06/07/2022   PLT 166 06/07/2022

## 2022-08-24 NOTE — Assessment & Plan Note (Signed)
Discussed referral to General Surgeon ; deferred

## 2022-08-24 NOTE — Assessment & Plan Note (Signed)
Noted on on right great toe.  rx cephalexin 500 mg qid x 7 days,  Epsom salt soaks for ten mintues 3 tims daily.  Probiotics daily for 3 weeks

## 2022-08-24 NOTE — Assessment & Plan Note (Addendum)
Historically well-controlled on Jardiance.  hemoglobin A1c is < 6.5.  Patient  is up-to-date on eye exams and foot exam is normal today  except for right toe paronychia. .Patient is tolerating statin therapy for CAD risk reduction and ACE/ARB has been suspended due to resolution of hypertension with wieght loss resulting in hypotension,  He has mild microalbuminuria   Lab Results  Component Value Date   HGBA1C 6.1 08/21/2022    No results found for: "LIPASE" Lab Results  Component Value Date   CHOL 91 08/21/2022   HDL 35.30 (L) 08/21/2022   LDLCALC 24 08/21/2022   LDLDIRECT 30.0 08/21/2022   TRIG 155.0 (H) 08/21/2022   CHOLHDL 3 08/21/2022    Lab Results  Component Value Date   MICROALBUR 6.4 (H) 11/12/2021   MICROALBUR 6.0 (H) 11/10/2020

## 2022-08-26 LAB — MICROALBUMIN / CREATININE URINE RATIO
Creatinine, Urine: 36.6 mg/dL
Microalb/Creat Ratio: 74 mg/g creat — ABNORMAL HIGH (ref 0–29)
Microalbumin, Urine: 27.1 ug/mL

## 2022-08-27 ENCOUNTER — Other Ambulatory Visit: Payer: Self-pay | Admitting: Family Medicine

## 2022-08-27 ENCOUNTER — Telehealth (INDEPENDENT_AMBULATORY_CARE_PROVIDER_SITE_OTHER): Payer: Medicare PPO | Admitting: Family Medicine

## 2022-08-27 VITALS — Wt 173.0 lb

## 2022-08-27 DIAGNOSIS — J22 Unspecified acute lower respiratory infection: Secondary | ICD-10-CM

## 2022-08-27 DIAGNOSIS — R062 Wheezing: Secondary | ICD-10-CM

## 2022-08-27 MED ORDER — SACCHAROMYCES BOULARDII 250 MG PO CAPS: 250.0000 mg | ORAL_CAPSULE | Freq: Every day | ORAL | 0 refills | Status: DC

## 2022-08-27 MED ORDER — AZITHROMYCIN 250 MG PO TABS: ORAL_TABLET | ORAL | 0 refills | Status: AC

## 2022-08-27 MED ORDER — AMOXICILLIN-POT CLAVULANATE 875-125 MG PO TABS: 1.0000 | ORAL_TABLET | Freq: Two times a day (BID) | ORAL | 0 refills | Status: AC

## 2022-08-27 NOTE — Progress Notes (Signed)
Virtual Visit via Video note  I connected with Anthony Allen on 08/31/22 at [id number] by video and verified that I am speaking with the correct person using two identifiers. Anthony Allen is currently located at home and his wife and son are currently with him during visit. The provider, Carollee Leitz, MD is located in their office at time of visit.  I discussed the limitations, risks, security and privacy concerns of performing an evaluation and management service by video and the availability of in person appointments. I also discussed with the patient that there may be a patient responsible charge related to this service. The patient expressed understanding and agreed to proceed.  Subjective: PCP: Crecencio Mc, MD  Chief Complaint  Patient presents with   Acute Home Visit    Cough/fever/weak x 1 day  tested covid negative    HPI History obtained by wife and patient. Reports symptoms started yesterday with sniffles.  Developed temperature of 101 last night.  Took Tylenol and cough drops.  Recheck temperature 100. Tuesday a.m. temp remained 101.  Developed a dry cough which worsened. No temperature today, reports has taken Tylenol x 2. Denies any history of nasal pressure or headaches, chest pain, nausea/vomiting.  Hydrating well.  Reports rhinorrhea, mild wheezing and sometimes having a hard time to breathe.  No history of tobacco use.  Did not perform COVID testing.  No recent sick contacts.  COVID vaccines up-to-date.  RSV vaccine up-to-date.  History of dysphagia status post CVA  ROS: Per HPI  Current Outpatient Medications:    acetaminophen (TYLENOL) 500 MG tablet, Take 500 mg by mouth every 6 (six) hours as needed., Disp: , Rfl:    ALPRAZolam (XANAX) 0.5 MG tablet, TAKE 1 TABLET(0.5 MG) BY MOUTH AT BEDTIME AS NEEDED FOR ANXIETY, Disp: 30 tablet, Rfl: 5   amoxicillin-clavulanate (AUGMENTIN) 875-125 MG tablet, Take 1 tablet by mouth 2 (two) times daily for 5 days., Disp:  10 tablet, Rfl: 0   azithromycin (ZITHROMAX) 250 MG tablet, Take 2 tablets on day 1, then 1 tablet daily on days 2 through 5, Disp: 6 tablet, Rfl: 0   Cholecalciferol (VITAMIN D-3) 1000 units CAPS, Take 1,000 Units by mouth daily., Disp: , Rfl:    clindamycin (CLEOCIN-T) 1 % lotion, Apply qd to back, let sit 5 minutes and rinse off, Disp: 60 mL, Rfl: 6   clopidogrel (PLAVIX) 75 MG tablet, TAKE 1 TABLET(75 MG) BY MOUTH DAILY, Disp: 90 tablet, Rfl: 1   empagliflozin (JARDIANCE) 10 MG TABS tablet, TAKE 1 TABLET(10 MG) BY MOUTH DAILY, Disp: 90 tablet, Rfl: 1   fluticasone (FLONASE) 50 MCG/ACT nasal spray, Place 2 sprays into both nostrils daily as needed for allergies or rhinitis. , Disp: , Rfl:    hydrocortisone 2.5 % lotion, APPLY TO THE AFFECTED AREA OF FACE EVERY OTHER DAY ALTERNATING WITH KETOCONAZOLE. APPLY TO LOWER LEGS AND ANKLES DAILY TO TWICE DAILY AS NEEDED, Disp: 118 mL, Rfl: 6   ketoconazole (NIZORAL) 2 % cream, APPLY TO THE AFFECTED AREA OF THE FACE EVERY OTHER DAY ALTERNATING WITH HYDROCORTISONE CREAM. USE IN THE GROIN/INNER THIGH DAILY AS NEEDED FOR RASH, Disp: 60 g, Rfl: 6   ketoconazole (NIZORAL) 2 % shampoo, Shampoo into the scalp and face. Let sit 5-10 minutes then wash off. Use 3d/wk., Disp: 120 mL, Rfl: 6   latanoprost (XALATAN) 0.005 % ophthalmic solution, Instill 1 drop into both eyes in the evening, Disp: , Rfl: 0   mirtazapine (REMERON) 15 MG  tablet, TAKE 1 TABLET(15 MG) BY MOUTH AT BEDTIME, Disp: 90 tablet, Rfl: 1   mometasone (ELOCON) 0.1 % lotion, Apply to affected areas of the scalp QOD, Disp: 60 mL, Rfl: 6   NYSTATIN powder, APPLY TOPICALLY TWICE A DAY AS NEEDED FOR RASH, Disp: 60 g, Rfl: 2   rosuvastatin (CRESTOR) 10 MG tablet, TAKE 1 TABLET(10 MG) BY MOUTH DAILY, Disp: 90 tablet, Rfl: 3   saccharomyces boulardii (FLORASTOR) 250 MG capsule, Take 1 capsule (250 mg total) by mouth daily., Disp: 90 capsule, Rfl: 0   timolol (TIMOPTIC) 0.5 % ophthalmic solution, Instill 1 drop  into the right eye in the morning, Disp: , Rfl: 0   benzonatate (TESSALON PERLES) 100 MG capsule, Take 2 capsules (200 mg total) by mouth 3 (three) times daily as needed for cough., Disp: 20 capsule, Rfl: 0   losartan (COZAAR) 25 MG tablet, Take 1 tablet (25 mg total) by mouth daily., Disp: 90 tablet, Rfl: 0   molnupiravir EUA (LAGEVRIO) 200 mg CAPS capsule, Take 4 capsules (800 mg total) by mouth 2 (two) times daily for 5 days., Disp: 40 capsule, Rfl: 0  Observations/Objective: Physical Exam Vitals reviewed.  Constitutional:      General: He is not in acute distress.    Appearance: Normal appearance. He is not toxic-appearing.  HENT:     Nose: Congestion present.  Eyes:     Conjunctiva/sclera: Conjunctivae normal.  Pulmonary:     Effort: Tachypnea present. No respiratory distress or retractions.  Neurological:     Mental Status: He is alert. Mental status is at baseline.  Psychiatric:        Mood and Affect: Mood normal.        Behavior: Behavior normal.        Thought Content: Thought content normal.        Judgment: Judgment normal.    Assessment and Plan: LRTI (lower respiratory tract infection) Assessment & Plan: Acute onset.  Not currently febrile, does not appear in acute respiratory distress.  Appears tachypneic however wife indicates breathing is at baseline. Will treat with antibiotics Follow-up in clinic tomorrow Plan for COVID, flu, RSV test and chest x-ray Discussed with patient and wife that if worsening cough or breathing patient should go to the ED for further evaluation.  Orders: -     Amoxicillin-Pot Clavulanate; Take 1 tablet by mouth 2 (two) times daily for 5 days.  Dispense: 10 tablet; Refill: 0 -     Saccharomyces boulardii; Take 1 capsule (250 mg total) by mouth daily.  Dispense: 90 capsule; Refill: 0 -     Azithromycin; Take 2 tablets on day 1, then 1 tablet daily on days 2 through 5  Dispense: 6 tablet; Refill: 0    Follow Up Instructions: Return in  about 1 day (around 08/28/2022).   I discussed the assessment and treatment plan with the patient. The patient was provided an opportunity to ask questions and all were answered. The patient agreed with the plan and demonstrated an understanding of the instructions.   The patient was advised to call back or seek an in-person evaluation if the symptoms worsen or if the condition fails to improve as anticipated.  The above assessment and management plan was discussed with the patient. The patient verbalized understanding of and has agreed to the management plan. Patient is aware to call the clinic if symptoms persist or worsen. Patient is aware when to return to the clinic for a follow-up visit. Patient educated on  when it is appropriate to go to the emergency department.   PDMP Reviewed  Carollee Leitz, MD

## 2022-08-27 NOTE — Patient Instructions (Addendum)
It was a pleasure meeting you today. Thank you for allowing me to take part in your health care.  Our goals for today as we discussed include:  Start Augmentin 1 tablet twice a day for 5 days Start Zithromax 2 tablets for one day then 1 tablet for 4 days Start Probiotics daily  Follow up tomorrow at 840 for evaluation.    If any worsening symptoms over night go to Emergency department   If you have any questions or concerns, please do not hesitate to call the office at [phone].  I look forward to our next visit and until then take care and stay safe.  Regards,   Carollee Leitz, MD   [facility]

## 2022-08-28 ENCOUNTER — Ambulatory Visit: Payer: Medicare PPO | Admitting: Family Medicine

## 2022-08-28 ENCOUNTER — Encounter: Payer: Self-pay | Admitting: Family Medicine

## 2022-08-28 VITALS — BP 118/60 | HR 92 | Temp 98.7°F

## 2022-08-28 DIAGNOSIS — U071 COVID-19: Secondary | ICD-10-CM | POA: Diagnosis not present

## 2022-08-28 DIAGNOSIS — R509 Fever, unspecified: Secondary | ICD-10-CM

## 2022-08-28 MED ORDER — BENZONATATE 100 MG PO CAPS: 200.0000 mg | ORAL_CAPSULE | Freq: Three times a day (TID) | ORAL | 0 refills | Status: DC | PRN

## 2022-08-28 MED ORDER — MOLNUPIRAVIR EUA 200MG CAPSULE: 4.0000 | ORAL_CAPSULE | Freq: Two times a day (BID) | ORAL | 0 refills | Status: AC

## 2022-08-28 MED ORDER — LOSARTAN POTASSIUM 25 MG PO TABS: 25.0000 mg | ORAL_TABLET | Freq: Every day | ORAL | 0 refills | Status: DC

## 2022-08-28 NOTE — Patient Instructions (Addendum)
It was a pleasure meeting you today. Thank you for allowing me to take part in your health care.  Our goals for today as we discussed include:  Stop Zithromax and Augmentin  Symptomatic management for fever, muscle aches and headaches  -Start Molnupiravir 4 capsules two times a day for 5 days -Tylenol 325-500 mg every 6 hours as needed -Tessalon Pearls three times a day for cough  Stay well hydrated Rest as needed with frequent repositioning and ambulation.  Increase activity as soon as tolerated to help with recovery.  Continue wearing masks, hand washing and self isolation until symptom free.  If you have worsening symptoms, especially difficulty breathing please call 911 or have someone take you to the emergency department.    If you have any questions or concerns, please do not hesitate to call the office at [phone].  I look forward to our next visit and until then take care and stay safe.  Regards,   Carollee Leitz, MD   [facility]

## 2022-08-28 NOTE — Addendum Note (Signed)
Addended by: Crecencio Mc on: 08/28/2022 06:30 AM   Modules accepted: Orders

## 2022-08-28 NOTE — Progress Notes (Signed)
SUBJECTIVE:   Chief Complaint  Patient presents with   Acute Visit    Covid Positive/ congestion/cough/runny nose   HPI Patient presents to clinic for follow-up video visit yesterday.  Reports took home COVID test last night and positive for COVID.  Symptoms have not improved but have not worsened.  Continues to have rhinorrhea, dry cough, mild shortness of breath.  Last temperature yesterday.  Had Tylenol 4 hours ago.  Currently afebrile.  Would like to start antiviral therapy.  PERTINENT PMH / PSH: Previous COVID infection 10/23 Right hemiparesis status post CVA Diabetes type II   OBJECTIVE:  BP 118/60   Pulse 92   Temp 98.7 F (37.1 C) (Oral)   SpO2 95%    Physical Exam Vitals reviewed.  Constitutional:      General: He is not in acute distress.    Appearance: Normal appearance. He is not ill-appearing, toxic-appearing or diaphoretic.  HENT:     Right Ear: Tympanic membrane, ear canal and external ear normal. There is no impacted cerumen.     Left Ear: Tympanic membrane, ear canal and external ear normal. There is no impacted cerumen.     Nose: Nose normal. No congestion or rhinorrhea.     Mouth/Throat:     Mouth: Mucous membranes are moist.     Pharynx: Oropharynx is clear. No oropharyngeal exudate or posterior oropharyngeal erythema.  Eyes:     General:        Right eye: No discharge.        Left eye: No discharge.     Conjunctiva/sclera: Conjunctivae normal.  Cardiovascular:     Rate and Rhythm: Normal rate and regular rhythm.     Heart sounds: Normal heart sounds.  Pulmonary:     Effort: Pulmonary effort is normal.     Breath sounds: Normal breath sounds.  Musculoskeletal:        General: Normal range of motion.  Lymphadenopathy:     Cervical: No cervical adenopathy.  Skin:    General: Skin is warm and dry.  Neurological:     General: No focal deficit present.     Mental Status: He is alert and oriented to person, place, and time. Mental status is at  baseline.  Psychiatric:        Mood and Affect: Mood normal.        Behavior: Behavior normal.        Thought Content: Thought content normal.        Judgment: Judgment normal.     ASSESSMENT/PLAN:  Positive self-administered antigen test for COVID-19 Assessment & Plan: Acute.  Symptoms started 2 days ago.  Positive home COVID test less than 24 hours.  Was initially started on antibiotics for possible CAP. Stop Zithromax and Augmentin Symptomatic management for fever, muscle aches and headaches  Start Molnupiravir 4 capsules two times a day for 5 days Tylenol 325-500 mg every 6 hours as needed Tessalon Pearls three times a day for cough Stay well hydrated Rest as needed with frequent repositioning and ambulation.  Increase activity as soon as tolerated to help with recovery. Continue wearing masks, hand washing and self isolation until symptom free. Recommend O2 monitor.  Maintain oxygen saturations greater than 92% Strict ED precautions provided Follow-up with PCP as needed.   Orders: -     molnupiravir EUA; Take 4 capsules (800 mg total) by mouth 2 (two) times daily for 5 days.  Dispense: 40 capsule; Refill: 0 -     Benzonatate;  Take 2 capsules (200 mg total) by mouth 3 (three) times daily as needed for cough.  Dispense: 20 capsule; Refill: 0   PDMP reviewed  Return if symptoms worsen or fail to improve, for PCP.  Carollee Leitz, MD

## 2022-08-28 NOTE — Assessment & Plan Note (Signed)
Adding low dose losartan  Lab Results  Component Value Date   LABMICR 27.1 08/23/2022   MICROALBUR 6.4 (H) 11/12/2021   MICROALBUR 6.0 (H) 11/10/2020

## 2022-08-31 ENCOUNTER — Encounter: Payer: Self-pay | Admitting: Family Medicine

## 2022-08-31 DIAGNOSIS — J22 Unspecified acute lower respiratory infection: Secondary | ICD-10-CM | POA: Insufficient documentation

## 2022-08-31 DIAGNOSIS — U071 COVID-19: Secondary | ICD-10-CM | POA: Insufficient documentation

## 2022-08-31 NOTE — Assessment & Plan Note (Signed)
Acute onset.  Not currently febrile, does not appear in acute respiratory distress.  Appears tachypneic however wife indicates breathing is at baseline. Will treat with antibiotics Follow-up in clinic tomorrow Plan for COVID, flu, RSV test and chest x-ray Discussed with patient and wife that if worsening cough or breathing patient should go to the ED for further evaluation.

## 2022-08-31 NOTE — Assessment & Plan Note (Signed)
Acute.  Symptoms started 2 days ago.  Positive home COVID test less than 24 hours.  Was initially started on antibiotics for possible CAP. Stop Zithromax and Augmentin Symptomatic management for fever, muscle aches and headaches  Start Molnupiravir 4 capsules two times a day for 5 days Tylenol 325-500 mg every 6 hours as needed Tessalon Pearls three times a day for cough Stay well hydrated Rest as needed with frequent repositioning and ambulation.  Increase activity as soon as tolerated to help with recovery. Continue wearing masks, hand washing and self isolation until symptom free. Recommend O2 monitor.  Maintain oxygen saturations greater than 92% Strict ED precautions provided Follow-up with PCP as needed.

## 2022-09-03 ENCOUNTER — Telehealth: Payer: Self-pay | Admitting: Internal Medicine

## 2022-09-03 ENCOUNTER — Other Ambulatory Visit: Payer: Self-pay

## 2022-09-03 MED ORDER — ROSUVASTATIN CALCIUM 10 MG PO TABS: ORAL_TABLET | ORAL | 3 refills | Status: DC

## 2022-09-03 NOTE — Telephone Encounter (Signed)
Prescription Request Medication already refilled, error

## 2022-09-15 ENCOUNTER — Other Ambulatory Visit: Payer: Self-pay | Admitting: Internal Medicine

## 2022-10-17 ENCOUNTER — Telehealth: Payer: Self-pay | Admitting: Internal Medicine

## 2022-10-17 NOTE — Telephone Encounter (Signed)
Contacted Anthony Allen to schedule their annual wellness visit. Appointment made for 10/30/2022.  Anthony Allen; Care Guide Ambulatory Clinical Support [place] [facility] Direct Dial: [phone]

## 2022-10-30 ENCOUNTER — Ambulatory Visit (INDEPENDENT_AMBULATORY_CARE_PROVIDER_SITE_OTHER): Payer: Medicare PPO

## 2022-10-30 VITALS — BP 103/57 | HR 65 | Temp 98.1°F | Ht 69.0 in | Wt 132.0 lb

## 2022-10-30 DIAGNOSIS — Z0000 Encounter for general adult medical examination without abnormal findings: Secondary | ICD-10-CM

## 2022-10-30 NOTE — Patient Instructions (Signed)
Health Maintenance, Male Adopting a healthy lifestyle and getting preventive care are important in promoting health and wellness. Ask your health care provider about: The right schedule for you to have regular tests and exams. Things you can do on your own to prevent diseases and keep yourself healthy. What should I know about diet, weight, and exercise? Eat a healthy diet  Eat a diet that includes plenty of vegetables, fruits, low-fat dairy products, and lean protein. Do not eat a lot of foods that are high in solid fats, added sugars, or sodium. Maintain a healthy weight Body mass index (BMI) is a measurement that can be used to identify possible weight problems. It estimates body fat based on height and weight. Your health care provider can help determine your BMI and help you achieve or maintain a healthy weight. Get regular exercise Get regular exercise. This is one of the most important things you can do for your health. Most adults should: Exercise for at least 150 minutes each week. The exercise should increase your heart rate and make you sweat (moderate-intensity exercise). Do strengthening exercises at least twice a week. This is in addition to the moderate-intensity exercise. Spend less time sitting. Even light physical activity can be beneficial. Watch cholesterol and blood lipids Have your blood tested for lipids and cholesterol at 85 years of age, then have this test every 5 years. You may need to have your cholesterol levels checked more often if: Your lipid or cholesterol levels are high. You are older than 85 years of age. You are at high risk for heart disease. What should I know about cancer screening? Many types of cancers can be detected early and may often be prevented. Depending on your health history and family history, you may need to have cancer screening at various ages. This may include screening for: Colorectal cancer. Prostate cancer. Skin cancer. Lung  cancer. What should I know about heart disease, diabetes, and high blood pressure? Blood pressure and heart disease High blood pressure causes heart disease and increases the risk of stroke. This is more likely to develop in people who have high blood pressure readings or are overweight. Talk with your health care provider about your target blood pressure readings. Have your blood pressure checked: Every 3-5 years if you are 18-39 years of age. Every year if you are 40 years old or older. If you are between the ages of 65 and 75 and are a current or former smoker, ask your health care provider if you should have a one-time screening for abdominal aortic aneurysm (AAA). Diabetes Have regular diabetes screenings. This checks your fasting blood sugar level. Have the screening done: Once every three years after age 45 if you are at a normal weight and have a low risk for diabetes. More often and at a younger age if you are overweight or have a high risk for diabetes. What should I know about preventing infection? Hepatitis B If you have a higher risk for hepatitis B, you should be screened for this virus. Talk with your health care provider to find out if you are at risk for hepatitis B infection. Hepatitis C Blood testing is recommended for: Everyone born from 1945 through 1965. Anyone with known risk factors for hepatitis C. Sexually transmitted infections (STIs) You should be screened each year for STIs, including gonorrhea and chlamydia, if: You are sexually active and are younger than 85 years of age. You are older than 85 years of age and your   health care provider tells you that you are at risk for this type of infection. Your sexual activity has changed since you were last screened, and you are at increased risk for chlamydia or gonorrhea. Ask your health care provider if you are at risk. Ask your health care provider about whether you are at high risk for HIV. Your health care provider  may recommend a prescription medicine to help prevent HIV infection. If you choose to take medicine to prevent HIV, you should first get tested for HIV. You should then be tested every 3 months for as long as you are taking the medicine. Follow these instructions at home: Alcohol use Do not drink alcohol if your health care provider tells you not to drink. If you drink alcohol: Limit how much you have to 0-2 drinks a day. Know how much alcohol is in your drink. In the [place] one drink equals one 12 oz bottle of beer (355 mL), one 5 oz glass of wine (148 mL), or one 1 oz glass of hard liquor (44 mL). Lifestyle Do not use any products that contain nicotine or tobacco. These products include cigarettes, chewing tobacco, and vaping devices, such as e-cigarettes. If you need help quitting, ask your health care provider. Do not use street drugs. Do not share needles. Ask your health care provider for help if you need support or information about quitting drugs. General instructions Schedule regular health, dental, and eye exams. Stay current with your vaccines. Tell your health care provider if: You often feel depressed. You have ever been abused or do not feel safe at home. Summary Adopting a healthy lifestyle and getting preventive care are important in promoting health and wellness. Follow your health care provider's instructions about healthy diet, exercising, and getting tested or screened for diseases. Follow your health care provider's instructions on monitoring your cholesterol and blood pressure. This information is not intended to replace advice given to you by your health care provider. Make sure you discuss any questions you have with your health care provider. Document Revised: 10/16/2020 Document Reviewed: 10/16/2020 [employer] Patient Education  2023 [employer]  

## 2022-10-30 NOTE — Progress Notes (Cosign Needed)
I connected with  Zorita Pang on 10/30/22 by a audio enabled telemedicine application and verified that I am speaking with the correct person using two identifiers.  Patient Location: Home  Provider Location: Home Office  I discussed the limitations of evaluation and management by telemedicine. The patient expressed understanding and agreed to proceed.   Subjective:   Anthony Allen is a 87 y.o. male who presents for Medicare Annual/Subsequent preventive examination.  Review of Systems    Per HPI unless specifically indicated below.  Cardiac Risk Factors include: advanced age (>62men, >15 women);male gender        Objective:       10/30/2022    2:34 PM 08/28/2022    9:07 AM 08/27/2022    3:42 PM  Vitals with BMI  Height 5\' 9"     Weight 132 lbs  173 lbs  BMI 19.48  25.54  Systolic 103 118   Diastolic 57 60   Pulse 65 92     Today's Vitals   10/30/22 1434  BP: (!) 103/57  Pulse: 65  Temp: 98.1 F (36.7 C)  TempSrc: Oral  SpO2: 96%  Weight: 132 lb (59.9 kg)  Height: 5\' 9"  (1.753 m)   Body mass index is 19.49 kg/m.     06/07/2022    1:32 PM 11/23/2021    1:09 PM 10/03/2021    3:20 PM 05/25/2021    1:10 PM 09/14/2020    3:03 PM 09/14/2019    2:40 PM 11/30/2018    2:11 PM  Advanced Directives  Does Patient Have a Medical Advance Directive? Yes Yes Yes Yes Yes Yes No  Type of Estate agent of [place];Living will Healthcare Power of [place];Living will Healthcare Power of [place];Living will Living will;Healthcare Power of [employer] Power of [place];Living will Healthcare Power of [place];Living will Living will  Does patient want to make changes to medical advance directive?   No - Patient declined Yes (ED - Information included in AVS) No - Patient declined No - Patient declined No - Patient declined  Copy of Healthcare Power of Attorney in Chart?   No - copy requested  No - copy requested No - copy requested No - copy  requested  Would patient like information on creating a medical advance directive?       No - Patient declined    Current Medications (verified) Outpatient Encounter Medications as of 10/30/2022  Medication Sig   acetaminophen (TYLENOL) 500 MG tablet Take 500 mg by mouth every 6 (six) hours as needed.   ALPRAZolam (XANAX) 0.5 MG tablet TAKE 1 TABLET(0.5 MG) BY MOUTH AT BEDTIME AS NEEDED FOR ANXIETY   benzonatate (TESSALON PERLES) 100 MG capsule Take 2 capsules (200 mg total) by mouth 3 (three) times daily as needed for cough.   Cholecalciferol (VITAMIN D-3) 1000 units CAPS Take 1,000 Units by mouth daily.   clindamycin (CLEOCIN-T) 1 % lotion Apply qd to back, let sit 5 minutes and rinse off   clopidogrel (PLAVIX) 75 MG tablet TAKE 1 TABLET(75 MG) BY MOUTH DAILY   empagliflozin (JARDIANCE) 10 MG TABS tablet TAKE 1 TABLET(10 MG) BY MOUTH DAILY   fluticasone (FLONASE) 50 MCG/ACT nasal spray Place 2 sprays into both nostrils daily as needed for allergies or rhinitis.    hydrocortisone 2.5 % lotion APPLY TO THE AFFECTED AREA OF FACE EVERY OTHER DAY ALTERNATING WITH KETOCONAZOLE. APPLY TO LOWER LEGS AND ANKLES DAILY TO TWICE DAILY AS NEEDED   ketoconazole (NIZORAL)  2 % cream APPLY TO THE AFFECTED AREA OF THE FACE EVERY OTHER DAY ALTERNATING WITH HYDROCORTISONE CREAM. USE IN THE GROIN/INNER THIGH DAILY AS NEEDED FOR RASH   ketoconazole (NIZORAL) 2 % shampoo Shampoo into the scalp and face. Let sit 5-10 minutes then wash off. Use 3d/wk.   latanoprost (XALATAN) 0.005 % ophthalmic solution Instill 1 drop into both eyes in the evening   mirtazapine (REMERON) 15 MG tablet TAKE 1 TABLET(15 MG) BY MOUTH AT BEDTIME   mometasone (ELOCON) 0.1 % lotion Apply to affected areas of the scalp QOD   NYSTATIN powder APPLY TOPICALLY TWICE A DAY AS NEEDED FOR RASH   rosuvastatin (CRESTOR) 10 MG tablet TAKE 1 TABLET(10 MG) BY MOUTH DAILY   saccharomyces boulardii (FLORASTOR) 250 MG capsule Take 1 capsule (250 mg total)  by mouth daily.   timolol (TIMOPTIC) 0.5 % ophthalmic solution Instill 1 drop into the right eye in the morning   losartan (COZAAR) 25 MG tablet Take 1 tablet (25 mg total) by mouth daily. (Patient not taking: Reported on 10/30/2022)   No facility-administered encounter medications on file as of 10/30/2022.    Allergies (verified) Patient has no known allergies.   History: Past Medical History:  Diagnosis Date   Arthritis    "mild in my hands" (06/04/2017)   Basal cell carcinoma 01/28/2008   L lat nose    Complication of anesthesia    "was told never to use Propofol because of parent's adverse reaction" (06/04/2017)   CVA (cerebral vascular accident) (HCC)    hospitalized from 12/22-12/24 due to left-sided weakness, slurred speech and difficulty swallowing. He was diagnosed as left pontine stroke./notes 06/03/2017   Family history of adverse reaction to anesthesia    "mother and dad had allergic reaction Propofol" (06/04/2017)   Glaucoma, both eyes    Gout    Hypertension    Parotid mass    Stroke (cerebrum) (HCC) 06/03/2017   Type 2 diabetes mellitus with hyperglycemia (HCC)    Past Surgical History:  Procedure Laterality Date   TONSILLECTOMY  1943   Family History  Problem Relation Age of Onset   Arthritis Mother    Stroke Father    Hypertension Father    Heart disease Father 68       AMI   Kidney disease Father        bladder ca congenital loss of kidney   Arthritis Maternal Grandmother    Early death Daughter 76       aspiration   Cancer Brother        Scalp   Macular degeneration Brother    Social History   Socioeconomic History   Marital status: Married    Spouse name: Not on file   Number of children: 1   Years of education: Not on file   Highest education level: Not on file  Occupational History   Occupation: Retired  Tobacco Use   Smoking status: Never   Smokeless tobacco: Never  Vaping Use   Vaping Use: Never used  Substance and Sexual Activity    Alcohol use: Yes    Comment: 1 glass of wine per month   Drug use: No   Sexual activity: Not Currently  Other Topics Concern   Not on file  Social History Narrative   Not on file   Social Determinants of Health   Financial Resource Strain: Low Risk  (10/30/2022)   Overall Financial Resource Strain (CARDIA)    Difficulty of Paying Living Expenses: Not  hard at all  Food Insecurity: No Food Insecurity (10/30/2022)   Hunger Vital Sign    Worried About Running Out of Food in the Last Year: Never true    Ran Out of Food in the Last Year: Never true  Transportation Needs: No Transportation Needs (10/30/2022)   PRAPARE - Administrator, Civil Service (Medical): No    Lack of Transportation (Non-Medical): No  Physical Activity: Inactive (10/30/2022)   Exercise Vital Sign    Days of Exercise per Week: 0 days    Minutes of Exercise per Session: 0 min  Stress: No Stress Concern Present (10/30/2022)   [employer] of Occupational Health - Occupational Stress Questionnaire    Feeling of Stress : Not at all  Social Connections: Moderately Isolated (10/30/2022)   Social Connection and Isolation Panel [NHANES]    Frequency of Communication with Friends and Family: More than three times a week    Frequency of Social Gatherings with Friends and Family: Once a week    Attends Religious Services: Never    Database administrator or Organizations: No    Attends Engineer, structural: Never    Marital Status: Married    Tobacco Counseling Counseling given: No   Clinical Intake:  Pre-visit preparation completed: No  Pain : No/denies pain     Nutritional Status: BMI of 19-24  Normal Nutritional Risks: None Diabetes: No  How often do you need to have someone help you when you read instructions, pamphlets, or other written materials from your doctor or pharmacy?: 1 - Never  Diabetic?No  Interpreter Needed?: No  Information entered by :: Laurel Dimmer,  CMA   Activities of Daily Living    10/30/2022    2:41 PM  In your present state of health, do you have any difficulty performing the following activities:  Hearing? 0  Vision? 1  Comment [facility]  Difficulty concentrating or making decisions? 1  Walking or climbing stairs? 1  Dressing or bathing? 1  Doing errands, shopping? 0    Patient Care Team: [name], MD as PCP - General (Internal Medicine) Creig Hines, MD as Consulting Physician (Oncology)  Indicate any recent Medical Services you may have received from other than [facility] providers in the past year (date may be approximate).     Assessment:   This is a routine wellness examination for Dorien.   Hearing/Vision screen Denies any hearing issues. Denies any change to her vision. Annual Eye Exam.   Dietary issues and exercise activities discussed:     Goals Addressed   None    Depression Screen    08/21/2022    4:40 PM 11/09/2021    4:07 PM 10/03/2021    3:13 PM 05/11/2021    3:23 PM 11/09/2020   12:22 PM 09/14/2020    3:02 PM 09/14/2019    2:41 PM  PHQ 2/9 Scores  PHQ - 2 Score 2 3 0 0 0 0 0  PHQ- 9 Score 6 12         Fall Risk    10/30/2022    2:41 PM 08/21/2022    4:40 PM 11/09/2021    3:11 PM 10/03/2021    3:21 PM 05/11/2021    3:23 PM  Fall Risk   Falls in the past year? 0 0 0 0 0  Number falls in past yr: 0 0  0   Injury with Fall? 0 0     Risk for fall  due to : No Fall Risks No Fall Risks Impaired mobility;Impaired balance/gait Impaired balance/gait Impaired mobility  Follow up Falls evaluation completed Falls evaluation completed Falls evaluation completed Falls evaluation completed Falls evaluation completed    FALL RISK PREVENTION PERTAINING TO THE HOME:  Any stairs in or around the home? No  If so, are there any without handrails? No  Home free of loose throw rugs in walkways, pet beds, electrical cords, etc? Yes  Adequate lighting in your home to reduce risk of falls? Yes    ASSISTIVE DEVICES UTILIZED TO PREVENT FALLS:  Life alert? Yes Use of a cane, walker or w/c? Yes  Grab bars in the bathroom? Yes  Shower chair or bench in shower? Yes  Elevated toilet seat or a handicapped toilet? No   TIMED UP AND GO:  Was the test performed? Unable to perform, virtual appointment   Cognitive Function:    09/14/2019    3:05 PM 09/05/2016   10:29 AM 09/06/2015   10:55 AM  MMSE - Mini Mental State Exam  Not completed: Unable to complete    Orientation to time  5 5  Orientation to Place  5 5  Registration  3 3  Attention/ Calculation  5 5  Recall  3 3  Language- name 2 objects  2 2  Language- repeat  1 1  Language- follow 3 step command  3 3  Language- read & follow direction  1 1  Write a sentence  1 1  Copy design  1 1  Total score  30 30        10/30/2022    2:42 PM 10/03/2021    3:25 PM  6CIT Screen  What Year? 0 points 0 points  What month? 0 points 0 points  What time? 0 points 0 points  Count back from 20 0 points   Months in reverse 0 points 0 points  Repeat phrase 0 points   Total Score 0 points     Immunizations Immunization History  Administered Date(s) Administered   COVID-19, mRNA, vaccine(Comirnaty)12 years and older 03/10/2022   Fluad Quad(high Dose 65+) 03/03/2019, 03/13/2020   Influenza Split 06/15/2013, 02/08/2014   Influenza, High Dose Seasonal PF 02/21/2017, 03/02/2018   Influenza-Unspecified 02/09/2015, 03/10/2016   PFIZER(Purple Top)SARS-COV-2 Vaccination 07/09/2019, 07/30/2019, 02/23/2020   PNEUMOCOCCAL CONJUGATE-20 08/12/2021   Pneumococcal Conjugate-13 06/23/2013   Pneumococcal Polysaccharide-23 05/02/2015   Tdap 06/11/2013   Zoster Recombinat (Shingrix) 02/21/2017, 05/03/2017   Zoster, Live 07/01/2013    TDAP status: Up to date  Flu Vaccine status: Up to date  Pneumococcal vaccine status: Up to date  Covid-19 vaccine status: Information provided on how to obtain vaccines.   Qualifies for Shingles Vaccine?  Yes   Zostavax completed Yes   Shingrix Completed?: Yes  Screening Tests Health Maintenance  Topic Date Due   COVID-19 Vaccine (5 - 2023-24 season) 05/05/2022   INFLUENZA VACCINE  01/09/2023   HEMOGLOBIN A1C  02/21/2023   DTaP/Tdap/Td (2 - Td or Tdap) 06/12/2023   OPHTHALMOLOGY EXAM  07/06/2023   Diabetic kidney evaluation - eGFR measurement  08/21/2023   FOOT EXAM  08/21/2023   Diabetic kidney evaluation - Urine ACR  08/23/2023   Medicare Annual Wellness (AWV)  10/30/2023   Pneumonia Vaccine 68+ Years old  Completed   Zoster Vaccines- Shingrix  Completed   HPV VACCINES  Aged Out    Health Maintenance  Health Maintenance Due  Topic Date Due   COVID-19 Vaccine (5 - 2023-24 season)  05/05/2022    Colorectal cancer screening: No longer required.   Lung Cancer Screening: (Low Dose CT Chest recommended if Age 67-80 years, 30 pack-year currently smoking OR have quit w/in 15years.) does not qualify.   Lung Cancer Screening Referral: not applicable   Additional Screening:  Hepatitis C Screening: does not qualify  Vision Screening: Recommended annual ophthalmology exams for early detection of glaucoma and other disorders of the eye. Is the patient up to date with their annual eye exam?  Yes  Who is the provider or what is the name of the office in which the patient attends annual eye exams? [facility]  If pt is not established with a provider, would they like to be referred to a provider to establish care? No .   Dental Screening: Recommended annual dental exams for proper oral hygiene  Community Resource Referral / Chronic Care Management: CRR required this visit?  No   CCM required this visit?  No      Plan:     I have personally reviewed and noted the following in the patient's chart:   Medical and social history Use of alcohol, tobacco or illicit drugs  Current medications and supplements including opioid prescriptions. Patient is not currently taking  opioid prescriptions. Functional ability and status Nutritional status Physical activity Advanced directives List of other physicians Hospitalizations, surgeries, and ER visits in previous 12 months Vitals Screenings to include cognitive, depression, and falls Referrals and appointments  In addition, I have reviewed and discussed with patient certain preventive protocols, quality metrics, and best practice recommendations. A written personalized care plan for preventive services as well as general preventive health recommendations were provided to patient.     Mr. Doto , Thank you for taking time to come for your Medicare Wellness Visit. I appreciate your ongoing commitment to your health goals. Please review the following plan we discussed and let me know if I can assist you in the future.   These are the goals we discussed:  Goals      Follow up with Primary Care Provider     As needed.        This is a list of the screening recommended for you and due dates:  Health Maintenance  Topic Date Due   COVID-19 Vaccine (5 - 2023-24 season) 05/05/2022   Flu Shot  01/09/2023   Hemoglobin A1C  02/21/2023   DTaP/Tdap/Td vaccine (2 - Td or Tdap) 06/12/2023   Eye exam for diabetics  07/06/2023   Yearly kidney function blood test for diabetes  08/21/2023   Complete foot exam   08/21/2023   Yearly kidney health urinalysis for diabetes  08/23/2023   Medicare Annual Wellness Visit  10/30/2023   Pneumonia Vaccine  Completed   Zoster (Shingles) Vaccine  Completed   HPV Vaccine  Aged Out     Mr. Bloom , Thank you for taking time to come for your Medicare Wellness Visit. I appreciate your ongoing commitment to your health goals. Please review the following plan we discussed and let me know if I can assist you in the future.   These are the goals we discussed:  Goals      Follow up with Primary Care Provider     As needed.        This is a list of the screening recommended  for you and due dates:  Health Maintenance  Topic Date Due   COVID-19 Vaccine (5 - 2023-24 season) 05/05/2022  Flu Shot  01/09/2023   Hemoglobin A1C  02/21/2023   DTaP/Tdap/Td vaccine (2 - Td or Tdap) 06/12/2023   Eye exam for diabetics  07/06/2023   Yearly kidney function blood test for diabetes  08/21/2023   Complete foot exam   08/21/2023   Yearly kidney health urinalysis for diabetes  08/23/2023   Medicare Annual Wellness Visit  10/30/2023   Pneumonia Vaccine  Completed   Zoster (Shingles) Vaccine  Completed   HPV Vaccine  Aged [address], [place]   10/30/2022   Nurse Notes: Approximately 30 minute Non-Face -To-Face Medicare Wellness Visit      I have reviewed the above information and agree with above.   Duncan Dull, MD

## 2022-11-09 ENCOUNTER — Other Ambulatory Visit: Payer: Self-pay | Admitting: Internal Medicine

## 2022-12-02 ENCOUNTER — Other Ambulatory Visit: Payer: Self-pay | Admitting: Internal Medicine

## 2022-12-02 DIAGNOSIS — N1831 Chronic kidney disease, stage 3a: Secondary | ICD-10-CM

## 2022-12-02 DIAGNOSIS — E1122 Type 2 diabetes mellitus with diabetic chronic kidney disease: Secondary | ICD-10-CM

## 2022-12-09 ENCOUNTER — Inpatient Hospital Stay: Payer: Medicare PPO | Attending: Oncology

## 2022-12-09 ENCOUNTER — Inpatient Hospital Stay: Payer: Medicare PPO

## 2022-12-09 DIAGNOSIS — D751 Secondary polycythemia: Secondary | ICD-10-CM | POA: Diagnosis not present

## 2022-12-09 LAB — CBC WITH DIFFERENTIAL/PLATELET
Abs Immature Granulocytes: 0.02 10*3/uL (ref 0.00–0.07)
Basophils Absolute: 0.1 10*3/uL (ref 0.0–0.1)
Basophils Relative: 1 %
Eosinophils Absolute: 0.2 10*3/uL (ref 0.0–0.5)
Eosinophils Relative: 2 %
HCT: 46 % (ref 39.0–52.0)
Hemoglobin: 15.9 g/dL (ref 13.0–17.0)
Immature Granulocytes: 0 %
Lymphocytes Relative: 17 %
Lymphs Abs: 1.2 10*3/uL (ref 0.7–4.0)
MCH: 32.5 pg (ref 26.0–34.0)
MCHC: 34.6 g/dL (ref 30.0–36.0)
MCV: 94.1 fL (ref 80.0–100.0)
Monocytes Absolute: 0.4 10*3/uL (ref 0.1–1.0)
Monocytes Relative: 5 %
Neutro Abs: 5.4 10*3/uL (ref 1.7–7.7)
Neutrophils Relative %: 75 %
Platelets: 162 10*3/uL (ref 150–400)
RBC: 4.89 MIL/uL (ref 4.22–5.81)
RDW: 13.2 % (ref 11.5–15.5)
WBC: 7.3 10*3/uL (ref 4.0–10.5)
nRBC: 0 % (ref 0.0–0.2)

## 2022-12-09 NOTE — Progress Notes (Signed)
Hct 46.1. No phlebotomy needed today 

## 2023-01-23 ENCOUNTER — Encounter (INDEPENDENT_AMBULATORY_CARE_PROVIDER_SITE_OTHER): Payer: Self-pay

## 2023-01-27 DIAGNOSIS — E119 Type 2 diabetes mellitus without complications: Secondary | ICD-10-CM | POA: Diagnosis not present

## 2023-01-27 DIAGNOSIS — H2513 Age-related nuclear cataract, bilateral: Secondary | ICD-10-CM | POA: Diagnosis not present

## 2023-01-27 DIAGNOSIS — H40003 Preglaucoma, unspecified, bilateral: Secondary | ICD-10-CM | POA: Diagnosis not present

## 2023-01-27 DIAGNOSIS — H353132 Nonexudative age-related macular degeneration, bilateral, intermediate dry stage: Secondary | ICD-10-CM | POA: Diagnosis not present

## 2023-01-27 LAB — HM DIABETES EYE EXAM

## 2023-01-28 ENCOUNTER — Encounter: Payer: Self-pay | Admitting: Internal Medicine

## 2023-02-21 ENCOUNTER — Ambulatory Visit: Payer: Medicare PPO | Admitting: Internal Medicine

## 2023-03-03 ENCOUNTER — Ambulatory Visit: Payer: Medicare PPO | Admitting: Internal Medicine

## 2023-03-03 VITALS — BP 128/70 | HR 75 | Temp 98.0°F | Resp 17 | Ht 70.0 in | Wt 126.0 lb

## 2023-03-03 DIAGNOSIS — I69351 Hemiplegia and hemiparesis following cerebral infarction affecting right dominant side: Secondary | ICD-10-CM

## 2023-03-03 DIAGNOSIS — Z7984 Long term (current) use of oral hypoglycemic drugs: Secondary | ICD-10-CM | POA: Diagnosis not present

## 2023-03-03 DIAGNOSIS — E785 Hyperlipidemia, unspecified: Secondary | ICD-10-CM | POA: Diagnosis not present

## 2023-03-03 DIAGNOSIS — I672 Cerebral atherosclerosis: Secondary | ICD-10-CM

## 2023-03-03 DIAGNOSIS — E1122 Type 2 diabetes mellitus with diabetic chronic kidney disease: Secondary | ICD-10-CM | POA: Diagnosis not present

## 2023-03-03 DIAGNOSIS — R350 Frequency of micturition: Secondary | ICD-10-CM

## 2023-03-03 DIAGNOSIS — D485 Neoplasm of uncertain behavior of skin: Secondary | ICD-10-CM | POA: Diagnosis not present

## 2023-03-03 DIAGNOSIS — E1169 Type 2 diabetes mellitus with other specified complication: Secondary | ICD-10-CM | POA: Diagnosis not present

## 2023-03-03 DIAGNOSIS — I69391 Dysphagia following cerebral infarction: Secondary | ICD-10-CM

## 2023-03-03 DIAGNOSIS — N189 Chronic kidney disease, unspecified: Secondary | ICD-10-CM

## 2023-03-03 DIAGNOSIS — D582 Other hemoglobinopathies: Secondary | ICD-10-CM

## 2023-03-03 DIAGNOSIS — R634 Abnormal weight loss: Secondary | ICD-10-CM

## 2023-03-03 DIAGNOSIS — Z23 Encounter for immunization: Secondary | ICD-10-CM

## 2023-03-03 LAB — LIPID PANEL
Cholesterol: 84 mg/dL (ref 0–200)
HDL: 31.6 mg/dL — ABNORMAL LOW (ref >39.00)
LDL Cholesterol: 22 mg/dL (ref 0–99)
NonHDL: 52.42
Total CHOL/HDL Ratio: 3
Triglycerides: 151 mg/dL — ABNORMAL HIGH (ref 0.0–149.0)
VLDL: 30.2 mg/dL (ref 0.0–40.0)

## 2023-03-03 LAB — COMPREHENSIVE METABOLIC PANEL
ALT: 23 U/L (ref 0–53)
AST: 25 U/L (ref 0–37)
Albumin: 4.2 g/dL (ref 3.5–5.2)
Alkaline Phosphatase: 81 U/L (ref 39–117)
BUN: 26 mg/dL — ABNORMAL HIGH (ref 6–23)
CO2: 31 mEq/L (ref 19–32)
Calcium: 9.7 mg/dL (ref 8.4–10.5)
Chloride: 101 mEq/L (ref 96–112)
Creatinine, Ser: 1.41 mg/dL (ref 0.40–1.50)
GFR: 45.61 mL/min — ABNORMAL LOW (ref >60.00)
Glucose, Bld: 149 mg/dL — ABNORMAL HIGH (ref 70–99)
Potassium: 3.7 mEq/L (ref 3.5–5.1)
Sodium: 141 mEq/L (ref 135–145)
Total Bilirubin: 1.1 mg/dL (ref 0.2–1.2)
Total Protein: 6.6 g/dL (ref 6.0–8.3)

## 2023-03-03 LAB — CBC WITH DIFFERENTIAL/PLATELET
Basophils Absolute: 0.1 10*3/uL (ref 0.0–0.1)
Basophils Relative: 1 % (ref 0.0–3.0)
Eosinophils Absolute: 0.2 10*3/uL (ref 0.0–0.7)
Eosinophils Relative: 3.7 % (ref 0.0–5.0)
HCT: 45.3 % (ref 39.0–52.0)
Hemoglobin: 15.2 g/dL (ref 13.0–17.0)
Lymphocytes Relative: 17.4 % (ref 12.0–46.0)
Lymphs Abs: 1.1 10*3/uL (ref 0.7–4.0)
MCHC: 33.6 g/dL (ref 30.0–36.0)
MCV: 96.9 fl (ref 78.0–100.0)
Monocytes Absolute: 0.4 10*3/uL (ref 0.1–1.0)
Monocytes Relative: 6.9 % (ref 3.0–12.0)
Neutro Abs: 4.4 10*3/uL (ref 1.4–7.7)
Neutrophils Relative %: 71 % (ref 43.0–77.0)
Platelets: 180 10*3/uL (ref 150.0–400.0)
RBC: 4.68 Mil/uL (ref 4.22–5.81)
RDW: 14.2 % (ref 11.5–15.5)
WBC: 6.3 10*3/uL (ref 4.0–10.5)

## 2023-03-03 LAB — HEMOGLOBIN A1C: Hgb A1c MFr Bld: 5.4 % (ref 4.6–6.5)

## 2023-03-03 NOTE — Assessment & Plan Note (Addendum)
Has been negative and hgb has normalized.  Continue surveillance    Lab Results  Component Value Date   WBC 6.3 03/03/2023   HGB 15.2 03/03/2023   HCT 45.3 03/03/2023   MCV 96.9 03/03/2023   PLT 180.0 03/03/2023

## 2023-03-03 NOTE — Assessment & Plan Note (Signed)
Secondary to change in taste and loss of teeth required pureed or chopped foods..   I have reviewed his diet and recommended that he increase her protein and fat intake  using protein shakes while monitoring his carbohydrates.

## 2023-03-03 NOTE — Patient Instructions (Signed)
Anthony Allen has had a 6 lb weight loss.   I Agree with 3 boosts daily to keep caloric count at or  above 1000   Add protein  powder (Vega,  premier etc) to current drinks to boost calories and protein   Broccoli cheese soup is another high calorie ood food choice.   2)  I will send picture of the skin nodule to Dr Laurena Bering  3)  The cough after he eats means he is aspiration . Speech therapist referral under way   4) Home health PT to maintain muscle strength

## 2023-03-03 NOTE — Progress Notes (Signed)
Subjective:  Patient ID: Anthony Allen, male    DOB: [birth year]  Age: 87 y.o. MRN: [id number]  CC: The primary encounter diagnosis was Hyperlipidemia associated with type 2 diabetes mellitus (HCC). Diagnoses of Type 2 diabetes mellitus with diabetic chronic kidney disease, unspecified CKD stage, unspecified whether long term insulin use (HCC), Need for influenza vaccination, Urinary frequency, Hemiplegia and hemiparesis following cerebral infarction affecting right dominant side (HCC), Dysphagia as late effect of cerebrovascular accident (CVA), Skin neoplasm of clavicular region, Cerebrovascular disease, arteriosclerotic, post-stroke, Elevated hemoglobin (HCC), and Abnormal weight loss were also pertinent to this visit.   HPI Anthony Allen presents for  Chief Complaint  Patient presents with   Medical Management of Chronic Issues    6 mth f/u    Follow up on Type 2 DM: , hypertension , right sided hemiparesis and mood lability secondary to CVA  post CVA  Izai is accompanied by his wife [place].  He has had a unintentional weight  loss of 6 lbs:   He has had decreased oral intake due to multiple factors: dysgeusia, recurrent aspiration, and RECURRENT loss of anachor teeth,  which has made  chewing painful;  Karl Pock has been supplementing his diet with 2 servings of Boost daily and states that he eats very little else.  He  SLEEPS UNTIL NOON.  NO SOLID FOOD UNTIL 5 .pm,  then  will only eat 1/6 of a sandwhich .  2) type 2 DM:  taking Jardiance  following a low GI diet has been difficult due to caloric needs and limited appetite   3) Papular crusting  nodular lesion on left Sternocleidomastoid are (above clavicle has telangiectasia.  Philipp Ovens, next appt 4 months away  4) Hemiparesis, right sided:  abel to rise from chair unassisted  but requires assistance for bathing.  Discussed home PT   Outpatient Medications Prior to Visit  Medication Sig Dispense Refill   acetaminophen  (TYLENOL) 500 MG tablet Take 500 mg by mouth every 6 (six) hours as needed.     ALPRAZolam (XANAX) 0.5 MG tablet TAKE 1 TABLET(0.5 MG) BY MOUTH AT BEDTIME AS NEEDED FOR ANXIETY 30 tablet 5   benzonatate (TESSALON PERLES) 100 MG capsule Take 2 capsules (200 mg total) by mouth 3 (three) times daily as needed for cough. 20 capsule 0   Cholecalciferol (VITAMIN D-3) 1000 units CAPS Take 1,000 Units by mouth daily.     clindamycin (CLEOCIN-T) 1 % lotion Apply qd to back, let sit 5 minutes and rinse off 60 mL 6   clopidogrel (PLAVIX) 75 MG tablet TAKE 1 TABLET(75 MG) BY MOUTH DAILY 90 tablet 1   empagliflozin (JARDIANCE) 10 MG TABS tablet TAKE 1 TABLET(10 MG) BY MOUTH DAILY 90 tablet 1   fluticasone (FLONASE) 50 MCG/ACT nasal spray Place 2 sprays into both nostrils daily as needed for allergies or rhinitis.      hydrocortisone 2.5 % lotion APPLY TO THE AFFECTED AREA OF FACE EVERY OTHER DAY ALTERNATING WITH KETOCONAZOLE. APPLY TO LOWER LEGS AND ANKLES DAILY TO TWICE DAILY AS NEEDED 118 mL 6   ketoconazole (NIZORAL) 2 % cream APPLY TO THE AFFECTED AREA OF THE FACE EVERY OTHER DAY ALTERNATING WITH HYDROCORTISONE CREAM. USE IN THE GROIN/INNER THIGH DAILY AS NEEDED FOR RASH 60 g 6   ketoconazole (NIZORAL) 2 % shampoo Shampoo into the scalp and face. Let sit 5-10 minutes then wash off. Use 3d/wk. 120 mL 6   latanoprost (XALATAN) 0.005 % ophthalmic solution Instill 1  drop into both eyes in the evening  0   losartan (COZAAR) 25 MG tablet TAKE 1 TABLET(25 MG) BY MOUTH DAILY 90 tablet 0   mirtazapine (REMERON) 15 MG tablet TAKE 1 TABLET(15 MG) BY MOUTH AT BEDTIME 90 tablet 1   mometasone (ELOCON) 0.1 % lotion Apply to affected areas of the scalp QOD 60 mL 6   NYSTATIN powder APPLY TOPICALLY TWICE A DAY AS NEEDED FOR RASH 60 g 2   rosuvastatin (CRESTOR) 10 MG tablet TAKE 1 TABLET(10 MG) BY MOUTH DAILY 90 tablet 3   saccharomyces boulardii (FLORASTOR) 250 MG capsule Take 1 capsule (250 mg total) by mouth daily. 90  capsule 0   timolol (TIMOPTIC) 0.5 % ophthalmic solution Instill 1 drop into the right eye in the morning  0   No facility-administered medications prior to visit.    Review of Systems;  Patient denies headache, fevers, malaise, unintentional weight loss, skin rash, eye pain, sinus congestion and sinus pain, sore throat, dysphagia,  hemoptysis , cough, dyspnea, wheezing, chest pain, palpitations, orthopnea, edema, abdominal pain, nausea, melena, diarrhea, constipation, flank pain, dysuria, hematuria, urinary  Frequency, nocturia, numbness, tingling, seizures,  Focal weakness, Loss of consciousness,  Tremor, insomnia, depression, anxiety, and suicidal ideation.      Objective:  BP 128/70   Pulse 75   Temp 98 F (36.7 C)   Resp 17   Ht 5\' 10"  (1.778 m)   Wt 126 lb (57.2 kg)   SpO2 97%   BMI 18.08 kg/m   BP Readings from Last 3 Encounters:  03/03/23 128/70  10/30/22 (!) 103/57  08/28/22 118/60    Wt Readings from Last 3 Encounters:  03/03/23 126 lb (57.2 kg)  10/30/22 132 lb (59.9 kg)  08/27/22 173 lb (78.5 kg)    Physical Exam Vitals reviewed.  Constitutional:      General: He is not in acute distress.    Appearance: Normal appearance. He is normal weight. He is not ill-appearing, toxic-appearing or diaphoretic.  HENT:     Head: Normocephalic.  Eyes:     General: No scleral icterus.       Right eye: No discharge.        Left eye: No discharge.     Conjunctiva/sclera: Conjunctivae normal.  Cardiovascular:     Rate and Rhythm: Normal rate and regular rhythm.     Heart sounds: Normal heart sounds.  Pulmonary:     Effort: Pulmonary effort is normal. No respiratory distress.     Breath sounds: Normal breath sounds.  Musculoskeletal:        General: Normal range of motion.     Cervical back: Normal range of motion.  Skin:    General: Skin is warm and dry.     Findings: Rash present. Rash is crusting and nodular.          Comments: 5 mm nodular crusting lesion on  left SCM area  with telangiectasias  Neurological:     General: No focal deficit present.     Mental Status: He is alert and oriented to person, place, and time. Mental status is at baseline.  Psychiatric:        Mood and Affect: Mood normal.        Behavior: Behavior normal.        Thought Content: Thought content normal.        Judgment: Judgment normal.    No images are attached to the encounter or orders placed in the encounter.  Lab Results  Component Value Date   HGBA1C 5.4 03/03/2023   HGBA1C 6.1 08/21/2022   HGBA1C 6.4 (H) 11/09/2021    Lab Results  Component Value Date   CREATININE 1.41 03/03/2023   CREATININE 1.62 (H) 08/21/2022   CREATININE 1.52 (H) 11/09/2021    Lab Results  Component Value Date   WBC 6.3 03/03/2023   HGB 15.2 03/03/2023   HCT 45.3 03/03/2023   PLT 180.0 03/03/2023   GLUCOSE 149 (H) 03/03/2023   CHOL 84 03/03/2023   TRIG 151.0 (H) 03/03/2023   HDL 31.60 (L) 03/03/2023   LDLDIRECT 30.0 08/21/2022   LDLCALC 22 03/03/2023   ALT 23 03/03/2023   AST 25 03/03/2023   NA 141 03/03/2023   K 3.7 03/03/2023   CL 101 03/03/2023   CREATININE 1.41 03/03/2023   BUN 26 (H) 03/03/2023   CO2 31 03/03/2023   TSH 2.16 08/21/2022   PSA 1.52 06/23/2013   INR 1.05 06/03/2017   HGBA1C 5.4 03/03/2023   MICROALBUR 6.4 (H) 11/12/2021    Assessment & Plan:  .Hyperlipidemia associated with type 2 diabetes mellitus (HCC) Assessment & Plan: He remains  well-controlled on Jardiance.  hemoglobin A1c is < 6.0 due to weight loss. Patient  is up-to-date on eye exams and foot exam is normal today  .Patient is tolerating statin therapy for CAD risk reduction and ACE/ARB has been suspended due to resolution of hypertension with weight loss resulting in hypotension,  He has mild microalbuminuria   Lab Results  Component Value Date   HGBA1C 5.4 03/03/2023    No results found for: "LIPASE" Lab Results  Component Value Date   CHOL 84 03/03/2023   HDL 31.60 (L)  03/03/2023   LDLCALC 22 03/03/2023   LDLDIRECT 30.0 08/21/2022   TRIG 151.0 (H) 03/03/2023   CHOLHDL 3 03/03/2023    Lab Results  Component Value Date   LABMICR 27.1 08/23/2022   MICROALBUR 6.4 (H) 11/12/2021   MICROALBUR 6.0 (H) 11/10/2020      Orders: -     Lipid panel  Type 2 diabetes mellitus with diabetic chronic kidney disease, unspecified CKD stage, unspecified whether long term insulin use (HCC) Assessment & Plan: Cr Is stable and microalbuminuria has improved.  He is no longer taking losartan due to normalization of blood pressures.    Lab Results  Component Value Date   LABMICR 27.1 08/23/2022   MICROALBUR 6.4 (H) 11/12/2021   MICROALBUR 6.0 (H) 11/10/2020       Orders: -     Comprehensive metabolic panel -     Hemoglobin A1c -     CBC with Differential/Platelet -     Microalbumin / creatinine urine ratio; Future  Need for influenza vaccination -     Flu Vaccine Trivalent High Dose (Fluad)  Urinary frequency -     Urinalysis, Routine w reflex microscopic; Future -     Urine Culture; Future  Hemiplegia and hemiparesis following cerebral infarction affecting right dominant side [facility]) Assessment & Plan: He has had some muscle wasting with weight loss    Home PT referral in progress for strengthening   Orders: -     Ambulatory referral to Home Health  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  Continue surveillance    Lab Results  Component Value Date   WBC 6.3 03/03/2023   HGB 15.2 03/03/2023   HCT 45.3 03/03/2023   MCV 96.9 03/03/2023   PLT 180.0 03/03/2023      Abnormal weight loss Assessment &  Plan: Secondary to change in taste and loss of teeth required pureed or chopped foods..   I have reviewed his diet and recommended that he increase her protein and fat intake  using protein shakes while monitoring his carbohydrates.        I provided 44 minutes of face-to-face time during this encounter reviewing patient's last visit with me, patient's  most recent visit with hematology , previous  labs and imaging studies, counseling on currently addressed issues,  and post visit ordering to diagnostics and therapeutics .   Follow-up: Return in about 6 months (around 08/31/2023).   [name], MD

## 2023-03-03 NOTE — Assessment & Plan Note (Signed)
Cr Is stable and microalbuminuria has improved.  He is no longer taking losartan due to normalization of blood pressures.    Lab Results  Component Value Date   LABMICR 27.1 08/23/2022   MICROALBUR 6.4 (H) 11/12/2021   MICROALBUR 6.0 (H) 11/10/2020

## 2023-03-03 NOTE — Assessment & Plan Note (Signed)
He remains  well-controlled on Jardiance.  hemoglobin A1c is < 6.0 due to weight loss. Patient  is up-to-date on eye exams and foot exam is normal today  .Patient is tolerating statin therapy for CAD risk reduction and ACE/ARB has been suspended due to resolution of hypertension with weight loss resulting in hypotension,  He has mild microalbuminuria   Lab Results  Component Value Date   HGBA1C 5.4 03/03/2023    No results found for: "LIPASE" Lab Results  Component Value Date   CHOL 84 03/03/2023   HDL 31.60 (L) 03/03/2023   LDLCALC 22 03/03/2023   LDLDIRECT 30.0 08/21/2022   TRIG 151.0 (H) 03/03/2023   CHOLHDL 3 03/03/2023    Lab Results  Component Value Date   LABMICR 27.1 08/23/2022   MICROALBUR 6.4 (H) 11/12/2021   MICROALBUR 6.0 (H) 11/10/2020

## 2023-03-03 NOTE — Assessment & Plan Note (Signed)
He has had some muscle wasting with weight loss    Home PT referral in progress for strengthening

## 2023-03-03 NOTE — Assessment & Plan Note (Signed)
Continue ASA, plavix and rosuvastatin

## 2023-03-05 ENCOUNTER — Telehealth: Payer: Self-pay | Admitting: Internal Medicine

## 2023-03-05 ENCOUNTER — Telehealth: Payer: Self-pay

## 2023-03-05 NOTE — Telephone Encounter (Signed)
dana from [facility] called stating they can not reach the pt for services. Annabelle Harman stated it will be at least 9/27 before they can get him started

## 2023-03-05 NOTE — Telephone Encounter (Signed)
FYI

## 2023-03-05 NOTE — Telephone Encounter (Signed)
Patient's wife, Drax [place], called to state they are following-up to see if we have patient's lab results.  Karl Pock states they are especially interested in the results of his A1c.

## 2023-03-05 NOTE — Telephone Encounter (Signed)
Pt's wife is requesting lab results.

## 2023-03-06 ENCOUNTER — Telehealth: Payer: Self-pay

## 2023-03-06 NOTE — Telephone Encounter (Signed)
-----   Message from [name] sent at 03/05/2023  5:22 PM EDT ----- Your  A1c is now 5.4 , down from 6.1  cholesterol is excellent,  and and kidney function has improved  . Do not worry about diabetes anymore.  Eat whatever sounds good !    Follow up in 6 months   Regards,   Duncan Dull, MD

## 2023-03-06 NOTE — Telephone Encounter (Signed)
LMTCB in regards to lab results.

## 2023-03-07 NOTE — Telephone Encounter (Signed)
noted 

## 2023-03-07 NOTE — Telephone Encounter (Signed)
Pt wife called back and I read the message to her and she verbalized understanding

## 2023-03-10 ENCOUNTER — Other Ambulatory Visit: Payer: Self-pay | Admitting: Internal Medicine

## 2023-03-10 ENCOUNTER — Telehealth: Payer: Self-pay | Admitting: Internal Medicine

## 2023-03-10 DIAGNOSIS — E1122 Type 2 diabetes mellitus with diabetic chronic kidney disease: Secondary | ICD-10-CM

## 2023-03-10 DIAGNOSIS — N1831 Chronic kidney disease, stage 3a: Secondary | ICD-10-CM

## 2023-03-10 DIAGNOSIS — R350 Frequency of micturition: Secondary | ICD-10-CM | POA: Diagnosis not present

## 2023-03-10 LAB — URINALYSIS, ROUTINE W REFLEX MICROSCOPIC
Bilirubin Urine: NEGATIVE
Hgb urine dipstick: NEGATIVE
Ketones, ur: NEGATIVE
Leukocytes,Ua: NEGATIVE
Nitrite: NEGATIVE
RBC / HPF: NONE SEEN (ref 0–?)
Specific Gravity, Urine: 1.02 (ref 1.000–1.030)
Urine Glucose: >=1000 — AB
Urobilinogen, UA: 0.2 (ref 0.0–1.0)
pH: 6 (ref 5.0–8.0)

## 2023-03-10 LAB — MICROALBUMIN / CREATININE URINE RATIO
Creatinine,U: 85.6 mg/dL
Microalb Creat Ratio: 7.2 mg/g (ref 0.0–30.0)
Microalb, Ur: 6.1 mg/dL — ABNORMAL HIGH (ref 0.0–1.9)

## 2023-03-10 NOTE — Telephone Encounter (Signed)
FYI

## 2023-03-10 NOTE — Telephone Encounter (Signed)
Anthony Allen called from [facility] and said they can't get out to the patient til October 4th this Friday. She just wanted to let you know.

## 2023-03-12 LAB — URINE CULTURE
MICRO NUMBER:: 15530577
Result:: NO GROWTH
SPECIMEN QUALITY:: ADEQUATE

## 2023-03-14 ENCOUNTER — Telehealth: Payer: Self-pay | Admitting: Internal Medicine

## 2023-03-14 NOTE — Telephone Encounter (Signed)
Jae Dire from [facility] called stating the wife wanted to reschedule the pt appointment because the pt fell out of bed and hit his head and wanted to reschedule for monday

## 2023-03-14 NOTE — Telephone Encounter (Signed)
FYI

## 2023-03-17 DIAGNOSIS — I700 Atherosclerosis of aorta: Secondary | ICD-10-CM | POA: Diagnosis not present

## 2023-03-17 DIAGNOSIS — I69351 Hemiplegia and hemiparesis following cerebral infarction affecting right dominant side: Secondary | ICD-10-CM | POA: Diagnosis not present

## 2023-03-17 DIAGNOSIS — E785 Hyperlipidemia, unspecified: Secondary | ICD-10-CM | POA: Diagnosis not present

## 2023-03-17 DIAGNOSIS — D751 Secondary polycythemia: Secondary | ICD-10-CM | POA: Diagnosis not present

## 2023-03-17 DIAGNOSIS — I69391 Dysphagia following cerebral infarction: Secondary | ICD-10-CM | POA: Diagnosis not present

## 2023-03-17 DIAGNOSIS — N183 Chronic kidney disease, stage 3 unspecified: Secondary | ICD-10-CM | POA: Diagnosis not present

## 2023-03-17 DIAGNOSIS — I129 Hypertensive chronic kidney disease with stage 1 through stage 4 chronic kidney disease, or unspecified chronic kidney disease: Secondary | ICD-10-CM | POA: Diagnosis not present

## 2023-03-17 DIAGNOSIS — E1122 Type 2 diabetes mellitus with diabetic chronic kidney disease: Secondary | ICD-10-CM | POA: Diagnosis not present

## 2023-03-17 DIAGNOSIS — E1169 Type 2 diabetes mellitus with other specified complication: Secondary | ICD-10-CM | POA: Diagnosis not present

## 2023-03-18 ENCOUNTER — Telehealth: Payer: Self-pay | Admitting: Internal Medicine

## 2023-03-18 NOTE — Telephone Encounter (Signed)
Requesting: alprazolam Contract: No UDS: No Last Visit: 03/03/2023 Next Visit: 09/01/2023 Last Refill: 08/21/2022  Please Advise

## 2023-03-18 NOTE — Telephone Encounter (Signed)
Prescription Request  03/18/2023  LOV: 03/03/2023  What is the name of the medication or equipment? ALPRAZolam   Have you contacted your pharmacy to request a refill? No   Which pharmacy would you like this sent to?  [facility]  Patient notified that their request is being sent to the clinical staff for review and that they should receive a response within 2 business days.   Please advise at Mobile [phone] (mobile)

## 2023-03-19 MED ORDER — ALPRAZOLAM 0.5 MG PO TABS: ORAL_TABLET | ORAL | 5 refills | Status: DC

## 2023-03-19 NOTE — Addendum Note (Signed)
Addended by: [name] on: 03/19/2023 12:47 PM   Modules accepted: Orders

## 2023-03-21 DIAGNOSIS — E785 Hyperlipidemia, unspecified: Secondary | ICD-10-CM | POA: Diagnosis not present

## 2023-03-21 DIAGNOSIS — I129 Hypertensive chronic kidney disease with stage 1 through stage 4 chronic kidney disease, or unspecified chronic kidney disease: Secondary | ICD-10-CM | POA: Diagnosis not present

## 2023-03-21 DIAGNOSIS — E1169 Type 2 diabetes mellitus with other specified complication: Secondary | ICD-10-CM | POA: Diagnosis not present

## 2023-03-21 DIAGNOSIS — I69351 Hemiplegia and hemiparesis following cerebral infarction affecting right dominant side: Secondary | ICD-10-CM | POA: Diagnosis not present

## 2023-03-21 DIAGNOSIS — D751 Secondary polycythemia: Secondary | ICD-10-CM | POA: Diagnosis not present

## 2023-03-21 DIAGNOSIS — E1122 Type 2 diabetes mellitus with diabetic chronic kidney disease: Secondary | ICD-10-CM | POA: Diagnosis not present

## 2023-03-21 DIAGNOSIS — N183 Chronic kidney disease, stage 3 unspecified: Secondary | ICD-10-CM | POA: Diagnosis not present

## 2023-03-21 DIAGNOSIS — I69391 Dysphagia following cerebral infarction: Secondary | ICD-10-CM | POA: Diagnosis not present

## 2023-03-21 DIAGNOSIS — I700 Atherosclerosis of aorta: Secondary | ICD-10-CM | POA: Diagnosis not present

## 2023-03-24 ENCOUNTER — Telehealth: Payer: Self-pay | Admitting: Internal Medicine

## 2023-03-24 MED ORDER — DIBUCAINE 1 % EX OINT: TOPICAL_OINTMENT | Freq: Three times a day (TID) | CUTANEOUS | 0 refills | Status: DC | PRN

## 2023-03-24 NOTE — Telephone Encounter (Signed)
Pt's wife is aware and gave a verbal understanding.

## 2023-03-24 NOTE — Telephone Encounter (Signed)
Nupercainal ointment sent to pharmacy to help numb hemorrhoids.  If this does not help she may have a thrombosed hemorrhoid that needs to be lanced  by General surgery so let me know

## 2023-03-24 NOTE — Telephone Encounter (Signed)
Patients wife called and would like some advise on helping her husband. She states he has really bad Hemorids and has not use the bathroom in 4 days and wants to get him some relief. She states she has used miralax and nothing. Please call, appt was made for the 16th.

## 2023-03-24 NOTE — Telephone Encounter (Signed)
Spoke with pt's wife and she stated that it doesn't seem to be that the stool is to hard to come out she stated that he is in so much pain with the hemorrhoid that it hurts for him to have a stool. She stated that they have been using Miralax twice daily, sennakot, have tried the glycerin suppository, preparation h cream and she has even lubricated her finger and got some of the stool out for him.

## 2023-03-26 ENCOUNTER — Ambulatory Visit: Payer: Medicare PPO | Admitting: Internal Medicine

## 2023-03-26 ENCOUNTER — Telehealth: Payer: Self-pay | Admitting: Internal Medicine

## 2023-03-26 NOTE — Telephone Encounter (Signed)
Fax is in the quick sign folder.

## 2023-03-26 NOTE — Telephone Encounter (Signed)
[facility] called about a faxed needing to be signed from the provider. She said they faxed it 3 times. She said she will re fax it. She said they need the verbal orders, so they can see the patient.

## 2023-03-27 NOTE — Telephone Encounter (Signed)
Signed orders have been faxed.

## 2023-03-28 DIAGNOSIS — I129 Hypertensive chronic kidney disease with stage 1 through stage 4 chronic kidney disease, or unspecified chronic kidney disease: Secondary | ICD-10-CM | POA: Diagnosis not present

## 2023-03-28 DIAGNOSIS — E785 Hyperlipidemia, unspecified: Secondary | ICD-10-CM | POA: Diagnosis not present

## 2023-03-28 DIAGNOSIS — I69351 Hemiplegia and hemiparesis following cerebral infarction affecting right dominant side: Secondary | ICD-10-CM | POA: Diagnosis not present

## 2023-03-28 DIAGNOSIS — D751 Secondary polycythemia: Secondary | ICD-10-CM | POA: Diagnosis not present

## 2023-03-28 DIAGNOSIS — I69391 Dysphagia following cerebral infarction: Secondary | ICD-10-CM | POA: Diagnosis not present

## 2023-03-28 DIAGNOSIS — N183 Chronic kidney disease, stage 3 unspecified: Secondary | ICD-10-CM | POA: Diagnosis not present

## 2023-03-28 DIAGNOSIS — I700 Atherosclerosis of aorta: Secondary | ICD-10-CM | POA: Diagnosis not present

## 2023-03-28 DIAGNOSIS — E1122 Type 2 diabetes mellitus with diabetic chronic kidney disease: Secondary | ICD-10-CM | POA: Diagnosis not present

## 2023-03-28 DIAGNOSIS — E1169 Type 2 diabetes mellitus with other specified complication: Secondary | ICD-10-CM | POA: Diagnosis not present

## 2023-04-04 DIAGNOSIS — I129 Hypertensive chronic kidney disease with stage 1 through stage 4 chronic kidney disease, or unspecified chronic kidney disease: Secondary | ICD-10-CM | POA: Diagnosis not present

## 2023-04-04 DIAGNOSIS — I69391 Dysphagia following cerebral infarction: Secondary | ICD-10-CM | POA: Diagnosis not present

## 2023-04-04 DIAGNOSIS — I700 Atherosclerosis of aorta: Secondary | ICD-10-CM | POA: Diagnosis not present

## 2023-04-04 DIAGNOSIS — E785 Hyperlipidemia, unspecified: Secondary | ICD-10-CM | POA: Diagnosis not present

## 2023-04-04 DIAGNOSIS — N183 Chronic kidney disease, stage 3 unspecified: Secondary | ICD-10-CM | POA: Diagnosis not present

## 2023-04-04 DIAGNOSIS — I69351 Hemiplegia and hemiparesis following cerebral infarction affecting right dominant side: Secondary | ICD-10-CM | POA: Diagnosis not present

## 2023-04-04 DIAGNOSIS — E1122 Type 2 diabetes mellitus with diabetic chronic kidney disease: Secondary | ICD-10-CM | POA: Diagnosis not present

## 2023-04-04 DIAGNOSIS — E1169 Type 2 diabetes mellitus with other specified complication: Secondary | ICD-10-CM | POA: Diagnosis not present

## 2023-04-04 DIAGNOSIS — D751 Secondary polycythemia: Secondary | ICD-10-CM | POA: Diagnosis not present

## 2023-04-08 DIAGNOSIS — E1122 Type 2 diabetes mellitus with diabetic chronic kidney disease: Secondary | ICD-10-CM | POA: Diagnosis not present

## 2023-04-08 DIAGNOSIS — I129 Hypertensive chronic kidney disease with stage 1 through stage 4 chronic kidney disease, or unspecified chronic kidney disease: Secondary | ICD-10-CM | POA: Diagnosis not present

## 2023-04-08 DIAGNOSIS — D751 Secondary polycythemia: Secondary | ICD-10-CM | POA: Diagnosis not present

## 2023-04-08 DIAGNOSIS — I69351 Hemiplegia and hemiparesis following cerebral infarction affecting right dominant side: Secondary | ICD-10-CM | POA: Diagnosis not present

## 2023-04-08 DIAGNOSIS — N183 Chronic kidney disease, stage 3 unspecified: Secondary | ICD-10-CM | POA: Diagnosis not present

## 2023-04-08 DIAGNOSIS — E1169 Type 2 diabetes mellitus with other specified complication: Secondary | ICD-10-CM | POA: Diagnosis not present

## 2023-04-08 DIAGNOSIS — E785 Hyperlipidemia, unspecified: Secondary | ICD-10-CM | POA: Diagnosis not present

## 2023-04-08 DIAGNOSIS — I69391 Dysphagia following cerebral infarction: Secondary | ICD-10-CM | POA: Diagnosis not present

## 2023-04-08 DIAGNOSIS — I700 Atherosclerosis of aorta: Secondary | ICD-10-CM | POA: Diagnosis not present

## 2023-04-09 DIAGNOSIS — E1122 Type 2 diabetes mellitus with diabetic chronic kidney disease: Secondary | ICD-10-CM

## 2023-04-09 DIAGNOSIS — I69391 Dysphagia following cerebral infarction: Secondary | ICD-10-CM | POA: Diagnosis not present

## 2023-04-09 DIAGNOSIS — K219 Gastro-esophageal reflux disease without esophagitis: Secondary | ICD-10-CM

## 2023-04-09 DIAGNOSIS — I700 Atherosclerosis of aorta: Secondary | ICD-10-CM | POA: Diagnosis not present

## 2023-04-09 DIAGNOSIS — R1310 Dysphagia, unspecified: Secondary | ICD-10-CM

## 2023-04-09 DIAGNOSIS — N183 Chronic kidney disease, stage 3 unspecified: Secondary | ICD-10-CM | POA: Diagnosis not present

## 2023-04-09 DIAGNOSIS — M1990 Unspecified osteoarthritis, unspecified site: Secondary | ICD-10-CM

## 2023-04-09 DIAGNOSIS — I69351 Hemiplegia and hemiparesis following cerebral infarction affecting right dominant side: Secondary | ICD-10-CM

## 2023-04-09 DIAGNOSIS — E785 Hyperlipidemia, unspecified: Secondary | ICD-10-CM | POA: Diagnosis not present

## 2023-04-09 DIAGNOSIS — I129 Hypertensive chronic kidney disease with stage 1 through stage 4 chronic kidney disease, or unspecified chronic kidney disease: Secondary | ICD-10-CM | POA: Diagnosis not present

## 2023-04-09 DIAGNOSIS — D751 Secondary polycythemia: Secondary | ICD-10-CM | POA: Diagnosis not present

## 2023-04-09 DIAGNOSIS — E1169 Type 2 diabetes mellitus with other specified complication: Secondary | ICD-10-CM

## 2023-04-11 DIAGNOSIS — N183 Chronic kidney disease, stage 3 unspecified: Secondary | ICD-10-CM | POA: Diagnosis not present

## 2023-04-11 DIAGNOSIS — I69351 Hemiplegia and hemiparesis following cerebral infarction affecting right dominant side: Secondary | ICD-10-CM | POA: Diagnosis not present

## 2023-04-11 DIAGNOSIS — I69391 Dysphagia following cerebral infarction: Secondary | ICD-10-CM | POA: Diagnosis not present

## 2023-04-11 DIAGNOSIS — E1169 Type 2 diabetes mellitus with other specified complication: Secondary | ICD-10-CM | POA: Diagnosis not present

## 2023-04-11 DIAGNOSIS — D751 Secondary polycythemia: Secondary | ICD-10-CM | POA: Diagnosis not present

## 2023-04-11 DIAGNOSIS — I129 Hypertensive chronic kidney disease with stage 1 through stage 4 chronic kidney disease, or unspecified chronic kidney disease: Secondary | ICD-10-CM | POA: Diagnosis not present

## 2023-04-11 DIAGNOSIS — E1122 Type 2 diabetes mellitus with diabetic chronic kidney disease: Secondary | ICD-10-CM | POA: Diagnosis not present

## 2023-04-11 DIAGNOSIS — I700 Atherosclerosis of aorta: Secondary | ICD-10-CM | POA: Diagnosis not present

## 2023-04-11 DIAGNOSIS — E785 Hyperlipidemia, unspecified: Secondary | ICD-10-CM | POA: Diagnosis not present

## 2023-04-15 DIAGNOSIS — I69351 Hemiplegia and hemiparesis following cerebral infarction affecting right dominant side: Secondary | ICD-10-CM | POA: Diagnosis not present

## 2023-04-15 DIAGNOSIS — I700 Atherosclerosis of aorta: Secondary | ICD-10-CM | POA: Diagnosis not present

## 2023-04-15 DIAGNOSIS — I69391 Dysphagia following cerebral infarction: Secondary | ICD-10-CM | POA: Diagnosis not present

## 2023-04-15 DIAGNOSIS — I129 Hypertensive chronic kidney disease with stage 1 through stage 4 chronic kidney disease, or unspecified chronic kidney disease: Secondary | ICD-10-CM | POA: Diagnosis not present

## 2023-04-15 DIAGNOSIS — D751 Secondary polycythemia: Secondary | ICD-10-CM | POA: Diagnosis not present

## 2023-04-15 DIAGNOSIS — E1169 Type 2 diabetes mellitus with other specified complication: Secondary | ICD-10-CM | POA: Diagnosis not present

## 2023-04-15 DIAGNOSIS — N183 Chronic kidney disease, stage 3 unspecified: Secondary | ICD-10-CM | POA: Diagnosis not present

## 2023-04-15 DIAGNOSIS — E785 Hyperlipidemia, unspecified: Secondary | ICD-10-CM | POA: Diagnosis not present

## 2023-04-15 DIAGNOSIS — E1122 Type 2 diabetes mellitus with diabetic chronic kidney disease: Secondary | ICD-10-CM | POA: Diagnosis not present

## 2023-04-16 DIAGNOSIS — I69351 Hemiplegia and hemiparesis following cerebral infarction affecting right dominant side: Secondary | ICD-10-CM | POA: Diagnosis not present

## 2023-04-16 DIAGNOSIS — I69391 Dysphagia following cerebral infarction: Secondary | ICD-10-CM | POA: Diagnosis not present

## 2023-04-16 DIAGNOSIS — E1122 Type 2 diabetes mellitus with diabetic chronic kidney disease: Secondary | ICD-10-CM | POA: Diagnosis not present

## 2023-04-16 DIAGNOSIS — N183 Chronic kidney disease, stage 3 unspecified: Secondary | ICD-10-CM | POA: Diagnosis not present

## 2023-04-16 DIAGNOSIS — I700 Atherosclerosis of aorta: Secondary | ICD-10-CM | POA: Diagnosis not present

## 2023-04-16 DIAGNOSIS — E1169 Type 2 diabetes mellitus with other specified complication: Secondary | ICD-10-CM | POA: Diagnosis not present

## 2023-04-16 DIAGNOSIS — E785 Hyperlipidemia, unspecified: Secondary | ICD-10-CM | POA: Diagnosis not present

## 2023-04-16 DIAGNOSIS — I129 Hypertensive chronic kidney disease with stage 1 through stage 4 chronic kidney disease, or unspecified chronic kidney disease: Secondary | ICD-10-CM | POA: Diagnosis not present

## 2023-04-16 DIAGNOSIS — D751 Secondary polycythemia: Secondary | ICD-10-CM | POA: Diagnosis not present

## 2023-04-24 DIAGNOSIS — I69391 Dysphagia following cerebral infarction: Secondary | ICD-10-CM | POA: Diagnosis not present

## 2023-04-24 DIAGNOSIS — E785 Hyperlipidemia, unspecified: Secondary | ICD-10-CM | POA: Diagnosis not present

## 2023-04-24 DIAGNOSIS — I129 Hypertensive chronic kidney disease with stage 1 through stage 4 chronic kidney disease, or unspecified chronic kidney disease: Secondary | ICD-10-CM | POA: Diagnosis not present

## 2023-04-24 DIAGNOSIS — E1169 Type 2 diabetes mellitus with other specified complication: Secondary | ICD-10-CM | POA: Diagnosis not present

## 2023-04-24 DIAGNOSIS — E1122 Type 2 diabetes mellitus with diabetic chronic kidney disease: Secondary | ICD-10-CM | POA: Diagnosis not present

## 2023-04-24 DIAGNOSIS — N183 Chronic kidney disease, stage 3 unspecified: Secondary | ICD-10-CM | POA: Diagnosis not present

## 2023-04-24 DIAGNOSIS — I700 Atherosclerosis of aorta: Secondary | ICD-10-CM | POA: Diagnosis not present

## 2023-04-24 DIAGNOSIS — I69351 Hemiplegia and hemiparesis following cerebral infarction affecting right dominant side: Secondary | ICD-10-CM | POA: Diagnosis not present

## 2023-04-24 DIAGNOSIS — D751 Secondary polycythemia: Secondary | ICD-10-CM | POA: Diagnosis not present

## 2023-05-01 DIAGNOSIS — I69391 Dysphagia following cerebral infarction: Secondary | ICD-10-CM | POA: Diagnosis not present

## 2023-05-01 DIAGNOSIS — E785 Hyperlipidemia, unspecified: Secondary | ICD-10-CM | POA: Diagnosis not present

## 2023-05-01 DIAGNOSIS — D751 Secondary polycythemia: Secondary | ICD-10-CM | POA: Diagnosis not present

## 2023-05-01 DIAGNOSIS — E1169 Type 2 diabetes mellitus with other specified complication: Secondary | ICD-10-CM | POA: Diagnosis not present

## 2023-05-01 DIAGNOSIS — I129 Hypertensive chronic kidney disease with stage 1 through stage 4 chronic kidney disease, or unspecified chronic kidney disease: Secondary | ICD-10-CM | POA: Diagnosis not present

## 2023-05-01 DIAGNOSIS — I700 Atherosclerosis of aorta: Secondary | ICD-10-CM | POA: Diagnosis not present

## 2023-05-01 DIAGNOSIS — I69351 Hemiplegia and hemiparesis following cerebral infarction affecting right dominant side: Secondary | ICD-10-CM | POA: Diagnosis not present

## 2023-05-01 DIAGNOSIS — E1122 Type 2 diabetes mellitus with diabetic chronic kidney disease: Secondary | ICD-10-CM | POA: Diagnosis not present

## 2023-05-01 DIAGNOSIS — N183 Chronic kidney disease, stage 3 unspecified: Secondary | ICD-10-CM | POA: Diagnosis not present

## 2023-05-10 ENCOUNTER — Other Ambulatory Visit: Payer: Self-pay | Admitting: Internal Medicine

## 2023-05-13 DIAGNOSIS — N183 Chronic kidney disease, stage 3 unspecified: Secondary | ICD-10-CM | POA: Diagnosis not present

## 2023-05-13 DIAGNOSIS — I700 Atherosclerosis of aorta: Secondary | ICD-10-CM | POA: Diagnosis not present

## 2023-05-13 DIAGNOSIS — E1122 Type 2 diabetes mellitus with diabetic chronic kidney disease: Secondary | ICD-10-CM | POA: Diagnosis not present

## 2023-05-13 DIAGNOSIS — E785 Hyperlipidemia, unspecified: Secondary | ICD-10-CM | POA: Diagnosis not present

## 2023-05-13 DIAGNOSIS — I129 Hypertensive chronic kidney disease with stage 1 through stage 4 chronic kidney disease, or unspecified chronic kidney disease: Secondary | ICD-10-CM | POA: Diagnosis not present

## 2023-05-13 DIAGNOSIS — I69351 Hemiplegia and hemiparesis following cerebral infarction affecting right dominant side: Secondary | ICD-10-CM | POA: Diagnosis not present

## 2023-05-13 DIAGNOSIS — I69391 Dysphagia following cerebral infarction: Secondary | ICD-10-CM | POA: Diagnosis not present

## 2023-05-13 DIAGNOSIS — D751 Secondary polycythemia: Secondary | ICD-10-CM | POA: Diagnosis not present

## 2023-05-13 DIAGNOSIS — E1169 Type 2 diabetes mellitus with other specified complication: Secondary | ICD-10-CM | POA: Diagnosis not present

## 2023-05-20 ENCOUNTER — Telehealth: Payer: Self-pay | Admitting: Internal Medicine

## 2023-05-20 MED ORDER — EMPAGLIFLOZIN 10 MG PO TABS: ORAL_TABLET | ORAL | 1 refills | Status: DC

## 2023-05-20 NOTE — Telephone Encounter (Signed)
Medication has been refilled.

## 2023-05-20 NOTE — Telephone Encounter (Signed)
Patient spouse called and patient is needing a med refill on empagliflozin (JARDIANCE) 10 MG TABS tablet   Please send to [facility]

## 2023-06-02 ENCOUNTER — Other Ambulatory Visit: Payer: Self-pay

## 2023-06-02 MED ORDER — CLOPIDOGREL BISULFATE 75 MG PO TABS: ORAL_TABLET | ORAL | 1 refills | Status: DC

## 2023-06-09 ENCOUNTER — Ambulatory Visit: Payer: Medicare PPO | Admitting: Oncology

## 2023-06-09 ENCOUNTER — Other Ambulatory Visit: Payer: Medicare PPO

## 2023-06-15 ENCOUNTER — Other Ambulatory Visit: Payer: Self-pay | Admitting: Internal Medicine

## 2023-06-15 DIAGNOSIS — E1122 Type 2 diabetes mellitus with diabetic chronic kidney disease: Secondary | ICD-10-CM

## 2023-06-15 DIAGNOSIS — N1831 Chronic kidney disease, stage 3a: Secondary | ICD-10-CM

## 2023-06-22 ENCOUNTER — Other Ambulatory Visit: Payer: Self-pay | Admitting: *Deleted

## 2023-06-22 DIAGNOSIS — D751 Secondary polycythemia: Secondary | ICD-10-CM

## 2023-06-23 ENCOUNTER — Inpatient Hospital Stay: Payer: Medicare PPO

## 2023-06-23 ENCOUNTER — Inpatient Hospital Stay: Payer: Medicare PPO | Admitting: Oncology

## 2023-07-15 ENCOUNTER — Inpatient Hospital Stay: Payer: Medicare PPO | Attending: Oncology

## 2023-07-15 ENCOUNTER — Inpatient Hospital Stay: Payer: Medicare PPO | Admitting: Oncology

## 2023-07-15 ENCOUNTER — Encounter: Payer: Self-pay | Admitting: Oncology

## 2023-07-15 VITALS — BP 135/65 | HR 77 | Temp 97.0°F | Resp 17 | Wt 121.9 lb

## 2023-07-15 DIAGNOSIS — D751 Secondary polycythemia: Secondary | ICD-10-CM | POA: Insufficient documentation

## 2023-07-15 LAB — CBC
HCT: 43.9 % (ref 39.0–52.0)
Hemoglobin: 15.2 g/dL (ref 13.0–17.0)
MCH: 33.4 pg (ref 26.0–34.0)
MCHC: 34.6 g/dL (ref 30.0–36.0)
MCV: 96.5 fL (ref 80.0–100.0)
Platelets: 158 10*3/uL (ref 150–400)
RBC: 4.55 MIL/uL (ref 4.22–5.81)
RDW: 12.9 % (ref 11.5–15.5)
WBC: 7.8 10*3/uL (ref 4.0–10.5)
nRBC: 0 % (ref 0.0–0.2)

## 2023-07-15 NOTE — Progress Notes (Signed)
Hematology/Oncology Consult note [facility]  [phone] [phone]  Patient Care Team: [name], MD as PCP - General (Internal Medicine) Creig Hines, MD as Consulting Physician (Oncology)   Name of the patient: Anthony Allen  [id number]  Nov 11, 1935   Date of visit: 07/15/23  Diagnosis-secondary polycythemia of unclear etiology  Chief complaint/ Reason for visit-routine follow-up of secondary polycythemia  Heme/Onc history: patient is a 88 year old male with a prior history of pontine stroke in 2018.  He has been referred to [place] for evaluation of polycythemia.  Of note patient has been evaluated for polycythemia in the past. Review of his prior CBCs reveals that he has had a high hemoglobin of around 18 even back in 2014.  He did have Jak 2 exon 12 mutation checked in 2018 which was negative.  Most recent CBC from 09/17/2018 showed a white count of 5.5, H&H of 18.4/53.4 and a platelet count of 150.  Patient has been a lifelong non-smoker.  He lives with his wife and does have some difficulty and needs assistance with his ADLs as well due to this stroke.  He is not on any testosterone replacement therapy.   Results of JAK2 mutation and exon 12 mutation done in 2018 negative.  No known chronic lung disease.  Given performance status and age bone marrow biopsy was not attempted.He also had CALR and MPL mutations checked which was negative.  Interval history-patient has lost significant weight over the last 1 years and is down to 121 pounds as compared to 132 pounds in May 2024.  He eats very endocrine and tries to supplement oral intake with 2 bottles of Ensure.  No recent hospitalizations  ECOG PS- 3 Pain scale- 0  Review of systems- Review of Systems  Constitutional:  Positive for malaise/fatigue and weight loss.      No Known Allergies   Past Medical History:  Diagnosis Date   Arthritis    "mild in my hands" (06/04/2017)    Basal cell carcinoma 01/28/2008   L lat nose    Complication of anesthesia    "was told never to use Propofol because of parent's adverse reaction" (06/04/2017)   CVA (cerebral vascular accident) (HCC)    hospitalized from 12/22-12/24 due to left-sided weakness, slurred speech and difficulty swallowing. He was diagnosed as left pontine stroke./notes 06/03/2017   Family history of adverse reaction to anesthesia    "mother and dad had allergic reaction Propofol" (06/04/2017)   Glaucoma, both eyes    Gout    Hypertension    Parotid mass    Stroke (cerebrum) (HCC) 06/03/2017   Type 2 diabetes mellitus with hyperglycemia (HCC)      Past Surgical History:  Procedure Laterality Date   TONSILLECTOMY  1943    Social History   Socioeconomic History   Marital status: Married    Spouse name: Not on file   Number of children: 1   Years of education: Not on file   Highest education level: Not on file  Occupational History   Occupation: Retired  Tobacco Use   Smoking status: Never   Smokeless tobacco: Never  Vaping Use   Vaping status: Never Used  Substance and Sexual Activity   Alcohol use: Yes    Comment: 1 glass of wine per month   Drug use: No   Sexual activity: Not Currently  Other Topics Concern   Not on file  Social History Narrative   Not on file  Social Drivers of Corporate investment banker Strain: Low Risk  (10/30/2022)   Overall Financial Resource Strain (CARDIA)    Difficulty of Paying Living Expenses: Not hard at all  Food Insecurity: No Food Insecurity (10/30/2022)   Hunger Vital Sign    Worried About Running Out of Food in the Last Year: Never true    Ran Out of Food in the Last Year: Never true  Transportation Needs: No Transportation Needs (10/30/2022)   PRAPARE - Administrator, Civil Service (Medical): No    Lack of Transportation (Non-Medical): No  Physical Activity: Inactive (10/30/2022)   Exercise Vital Sign    Days of Exercise per  Week: 0 days    Minutes of Exercise per Session: 0 min  Stress: No Stress Concern Present (10/30/2022)   [employer] of Occupational Health - Occupational Stress Questionnaire    Feeling of Stress : Not at all  Social Connections: Moderately Isolated (10/30/2022)   Social Connection and Isolation Panel [NHANES]    Frequency of Communication with Friends and Family: More than three times a week    Frequency of Social Gatherings with Friends and Family: Once a week    Attends Religious Services: Never    Database administrator or Organizations: No    Attends Banker Meetings: Never    Marital Status: Married  Catering manager Violence: Not At Risk (10/30/2022)   Humiliation, Afraid, Rape, and Kick questionnaire    Fear of Current or Ex-Partner: No    Emotionally Abused: No    Physically Abused: No    Sexually Abused: No    Family History  Problem Relation Age of Onset   Arthritis Mother    Stroke Father    Hypertension Father    Heart disease Father 20       AMI   Kidney disease Father        bladder ca congenital loss of kidney   Arthritis Maternal Grandmother    Early death Daughter 16       aspiration   Cancer Brother        Scalp   Macular degeneration Brother      Current Outpatient Medications:    acetaminophen (TYLENOL) 500 MG tablet, Take 500 mg by mouth every 6 (six) hours as needed., Disp: , Rfl:    ALPRAZolam (XANAX) 0.5 MG tablet, TAKE 1 TABLET(0.5 MG) BY MOUTH AT BEDTIME AS NEEDED FOR ANXIETY, Disp: 30 tablet, Rfl: 5   benzonatate (TESSALON PERLES) 100 MG capsule, Take 2 capsules (200 mg total) by mouth 3 (three) times daily as needed for cough., Disp: 20 capsule, Rfl: 0   Cholecalciferol (VITAMIN D-3) 1000 units CAPS, Take 1,000 Units by mouth daily., Disp: , Rfl:    clindamycin (CLEOCIN-T) 1 % lotion, Apply qd to back, let sit 5 minutes and rinse off, Disp: 60 mL, Rfl: 6   clopidogrel (PLAVIX) 75 MG tablet, TAKE 1 TABLET(75 MG) BY MOUTH DAILY,  Disp: 90 tablet, Rfl: 1   dibucaine (NUPERCAINAL) 1 % ointment, Apply topically 3 (three) times daily as needed for pain., Disp: 30 g, Rfl: 0   empagliflozin (JARDIANCE) 10 MG TABS tablet, TAKE 1 TABLET(10 MG) BY MOUTH DAILY, Disp: 90 tablet, Rfl: 1   fluticasone (FLONASE) 50 MCG/ACT nasal spray, Place 2 sprays into both nostrils daily as needed for allergies or rhinitis. , Disp: , Rfl:    hydrocortisone 2.5 % lotion, APPLY TO THE AFFECTED AREA OF FACE EVERY OTHER DAY  ALTERNATING WITH KETOCONAZOLE. APPLY TO LOWER LEGS AND ANKLES DAILY TO TWICE DAILY AS NEEDED, Disp: 118 mL, Rfl: 6   ketoconazole (NIZORAL) 2 % cream, APPLY TO THE AFFECTED AREA OF THE FACE EVERY OTHER DAY ALTERNATING WITH HYDROCORTISONE CREAM. USE IN THE GROIN/INNER THIGH DAILY AS NEEDED FOR RASH, Disp: 60 g, Rfl: 6   ketoconazole (NIZORAL) 2 % shampoo, Shampoo into the scalp and face. Let sit 5-10 minutes then wash off. Use 3d/wk., Disp: 120 mL, Rfl: 6   latanoprost (XALATAN) 0.005 % ophthalmic solution, Instill 1 drop into both eyes in the evening, Disp: , Rfl: 0   losartan (COZAAR) 25 MG tablet, TAKE 1 TABLET(25 MG) BY MOUTH DAILY, Disp: 90 tablet, Rfl: 0   mirtazapine (REMERON) 15 MG tablet, TAKE 1 TABLET(15 MG) BY MOUTH AT BEDTIME, Disp: 90 tablet, Rfl: 1   mometasone (ELOCON) 0.1 % lotion, Apply to affected areas of the scalp QOD, Disp: 60 mL, Rfl: 6   NYSTATIN powder, APPLY TOPICALLY TWICE A DAY AS NEEDED FOR RASH, Disp: 60 g, Rfl: 2   rosuvastatin (CRESTOR) 10 MG tablet, TAKE 1 TABLET(10 MG) BY MOUTH DAILY, Disp: 90 tablet, Rfl: 3   saccharomyces boulardii (FLORASTOR) 250 MG capsule, Take 1 capsule (250 mg total) by mouth daily., Disp: 90 capsule, Rfl: 0   timolol (TIMOPTIC) 0.5 % ophthalmic solution, Instill 1 drop into the right eye in the morning, Disp: , Rfl: 0  Physical exam:  Vitals:   07/15/23 1331  BP: 135/65  Pulse: 77  Resp: 17  Temp: (!) 97 F (36.1 C)  TempSrc: Tympanic  SpO2: 98%  Weight: 121 lb 14.4 oz  (55.3 kg)   Physical Exam Constitutional:      Comments: Sitting in a wheelchair.  Appears in no acute distress  Cardiovascular:     Rate and Rhythm: Normal rate and regular rhythm.     Heart sounds: Normal heart sounds.  Pulmonary:     Effort: Pulmonary effort is normal.     Breath sounds: Normal breath sounds.  Abdominal:     General: Bowel sounds are normal.     Palpations: Abdomen is soft.  Skin:    General: Skin is warm and dry.  Neurological:     Mental Status: He is alert and oriented to person, place, and time.         Latest Ref Rng & Units 03/03/2023    8:49 AM  CMP  Glucose 70 - 99 mg/dL 604   BUN 6 - 23 mg/dL 26   Creatinine 5.40 - 1.50 mg/dL 9.81   Sodium 191 - 478 mEq/L 141   Potassium 3.5 - 5.1 mEq/L 3.7   Chloride 96 - 112 mEq/L 101   CO2 19 - 32 mEq/L 31   Calcium 8.4 - 10.5 mg/dL 9.7   Total Protein 6.0 - 8.3 g/dL 6.6   Total Bilirubin 0.2 - 1.2 mg/dL 1.1   Alkaline Phos 39 - 117 U/L 81   AST 0 - 37 U/L 25   ALT 0 - 53 U/L 23       Latest Ref Rng & Units 07/15/2023    1:11 PM  CBC  WBC 4.0 - 10.5 K/uL 7.8   Hemoglobin 13.0 - 17.0 g/dL 29.5   Hematocrit 62.1 - 52.0 % 43.9   Platelets 150 - 400 K/uL 158     No images are attached to the encounter.  No results found.   Assessment and plan- Patient is a 88 y.o. male  here for follow-up of secondary polycythemia  Patient's hemoglobin has been stable around 15 for the last 2 years.  Especially since his weight loss he has not required any phlebotomy so far.  He does not require any follow-up with me at this time.  He can continue to follow-up with Dr. Norton Blizzard and he can be referred to [place] in the future if questions or concerns arise    Visit Diagnosis 1. Polycythemia, secondary      Dr. Owens Shark, MD, MPH [facility] [phone] 07/15/2023 2:25 PM

## 2023-07-23 ENCOUNTER — Encounter: Payer: Self-pay | Admitting: Dermatology

## 2023-07-23 ENCOUNTER — Ambulatory Visit: Payer: Medicare PPO | Admitting: Dermatology

## 2023-07-23 DIAGNOSIS — L814 Other melanin hyperpigmentation: Secondary | ICD-10-CM | POA: Diagnosis not present

## 2023-07-23 DIAGNOSIS — D170 Benign lipomatous neoplasm of skin and subcutaneous tissue of head, face and neck: Secondary | ICD-10-CM

## 2023-07-23 DIAGNOSIS — L280 Lichen simplex chronicus: Secondary | ICD-10-CM

## 2023-07-23 DIAGNOSIS — Z7189 Other specified counseling: Secondary | ICD-10-CM

## 2023-07-23 DIAGNOSIS — D229 Melanocytic nevi, unspecified: Secondary | ICD-10-CM

## 2023-07-23 DIAGNOSIS — W908XXA Exposure to other nonionizing radiation, initial encounter: Secondary | ICD-10-CM | POA: Diagnosis not present

## 2023-07-23 DIAGNOSIS — L578 Other skin changes due to chronic exposure to nonionizing radiation: Secondary | ICD-10-CM

## 2023-07-23 DIAGNOSIS — L720 Epidermal cyst: Secondary | ICD-10-CM

## 2023-07-23 DIAGNOSIS — D692 Other nonthrombocytopenic purpura: Secondary | ICD-10-CM

## 2023-07-23 DIAGNOSIS — L729 Follicular cyst of the skin and subcutaneous tissue, unspecified: Secondary | ICD-10-CM

## 2023-07-23 DIAGNOSIS — Z85828 Personal history of other malignant neoplasm of skin: Secondary | ICD-10-CM

## 2023-07-23 DIAGNOSIS — L219 Seborrheic dermatitis, unspecified: Secondary | ICD-10-CM

## 2023-07-23 DIAGNOSIS — Z79899 Other long term (current) drug therapy: Secondary | ICD-10-CM

## 2023-07-23 DIAGNOSIS — Z1283 Encounter for screening for malignant neoplasm of skin: Secondary | ICD-10-CM

## 2023-07-23 DIAGNOSIS — D1801 Hemangioma of skin and subcutaneous tissue: Secondary | ICD-10-CM

## 2023-07-23 DIAGNOSIS — L821 Other seborrheic keratosis: Secondary | ICD-10-CM | POA: Diagnosis not present

## 2023-07-23 DIAGNOSIS — L739 Follicular disorder, unspecified: Secondary | ICD-10-CM

## 2023-07-23 MED ORDER — CLINDAMYCIN PHOSPHATE 1 % EX LOTN: TOPICAL_LOTION | CUTANEOUS | 6 refills | Status: DC

## 2023-07-23 MED ORDER — KETOCONAZOLE 2 % EX SHAM: MEDICATED_SHAMPOO | CUTANEOUS | 6 refills | Status: DC

## 2023-07-23 MED ORDER — KETOCONAZOLE 2 % EX CREA: TOPICAL_CREAM | CUTANEOUS | 6 refills | Status: DC

## 2023-07-23 MED ORDER — HYDROCORTISONE 2.5 % EX LOTN: TOPICAL_LOTION | CUTANEOUS | 6 refills | Status: DC

## 2023-07-23 MED ORDER — MOMETASONE FUROATE 0.1 % EX SOLN: CUTANEOUS | 6 refills | Status: DC

## 2023-07-23 NOTE — Patient Instructions (Signed)

## 2023-07-23 NOTE — Progress Notes (Signed)
Follow-Up Visit   Subjective  Anthony Allen is a 88 y.o. male who presents for the following: Skin Cancer Screening and Full Body Skin Exam, hx of BCC, Patient wife request refills of topical medications he uses as needed  for rashes on for his scalp, face,chest and back.    The patient presents for Total-Body Skin Exam (TBSE) for skin cancer screening and mole check. The patient has spots, moles and lesions to be evaluated, some may be new or changing and the patient may have concern these could be cancer.  Wife is with patient and contributes to history.   The following portions of the chart were reviewed this encounter and updated as appropriate: medications, allergies, medical history  Review of Systems:  No other skin or systemic complaints except as noted in HPI or Assessment and Plan.  Objective  Well appearing patient in no apparent distress; mood and affect are within normal limits.  A full examination was performed including scalp, head, eyes, ears, nose, lips, neck, chest, axillae, abdomen, back, buttocks, bilateral upper extremities, bilateral lower extremities, hands, feet, fingers, toes, fingernails, and toenails. All findings within normal limits unless otherwise noted below.   Relevant physical exam findings are noted in the Assessment and Plan.    Assessment & Plan   SKIN CANCER SCREENING PERFORMED TODAY.  ACTINIC DAMAGE - Chronic condition, secondary to cumulative UV/sun exposure - diffuse scaly erythematous macules with underlying dyspigmentation - Recommend daily broad spectrum sunscreen SPF 30+ to sun-exposed areas, reapply every 2 hours as needed.  - Staying in the shade or wearing long sleeves, sun glasses (UVA+UVB protection) and wide brim hats (4-inch brim around the entire circumference of the hat) are also recommended for sun protection.  - Call for new or changing lesions.  LENTIGINES, SEBORRHEIC KERATOSES, HEMANGIOMAS - Benign normal skin  lesions - Benign-appearing - Call for any changes  MELANOCYTIC NEVI - Tan-brown and/or pink-flesh-colored symmetric macules and papules - Benign appearing on exam today - Observation - Call clinic for new or changing moles - Recommend daily use of broad spectrum spf 30+ sunscreen to sun-exposed areas.   Purpura - Chronic; persistent and recurrent.  Treatable, but not curable. Hands  - Violaceous macules and patches - Benign - Related to trauma, age, sun damage and/or use of blood thinners, chronic use of topical and/or oral steroids - Observe - Can use OTC arnica containing moisturizer such as Dermend Bruise Formula if desired - Call for worsening or other concerns   Folliculitis neck and shoulders Folliculitis with LSC  Cont Clindamycin lotion qd to back, let sit 5 minutes and rinse off Chronic and persistent condition with duration or expected duration over one year. Condition is symptomatic/ bothersome to patient. Not currently at goal. clindamycin (CLEOCIN-T) 1 % lotion - neck, shoulders Apply topically daily. Apply to neck and shoulders daily     Seborrheic dermatitis scalp Seborrheic Dermatitis  -  is a chronic persistent rash characterized by pinkness and scaling most commonly of the mid face but also can occur on the scalp (dandruff), ears; mid chest, mid back and groin.  It tends to be exacerbated by stress and cooler weather.  People who have neurologic disease may experience new onset or exacerbation of existing seborrheic dermatitis.  The condition is not curable but treatable and can be controlled.    Continue Mometasone solution to aa's scalp QOD or 4d/wk PRN.  Continue Ketoconazole 2% cream QOD alternating with HC 2.5% cream QOD.  Cont Ketoconazole  2% shampoo let sit 5-10 minutes before washing off. Use 3d/wk.   Lipoma  Exam: Subcutaneous rubbery nodule(s) Location: left forehead   Benign-appearing. Exam most consistent with a lipoma. Discussed that a lipoma  is a benign fatty growth that can grow over time and sometimes get irritated. Recommend observation if it is not bothersome or changing. Discussed option of ILK injections or surgical excision to remove it if it is growing, symptomatic, or other changes noted. Please call for new or changing lesions so they can be evaluated.    EPIDERMAL INCLUSION CYST Exam: Subcutaneous nodule on the left neck   Benign-appearing. Exam most consistent with an epidermal inclusion cyst. Discussed that a cyst is a benign growth that can grow over time and sometimes get irritated or inflamed. Recommend observation if it is not bothersome. Discussed option of surgical excision to remove it if it is growing, symptomatic, or other changes noted. Please call for new or changing lesions so they can be evaluated.    History of Basal Cell Carcinoma of the Skin Left lateral nose 2009 - No evidence of recurrence today - Recommend regular full body skin exams - Recommend daily broad spectrum sunscreen SPF 30+ to sun-exposed areas, reapply every 2 hours as needed.  - Call if any new or changing lesions are noted between office visits     FOLLICULITIS   Related Medications clindamycin (CLEOCIN-T) 1 % lotion Apply qd to back, let sit 5 minutes and rinse off SEBORRHEIC DERMATITIS   Related Medications mometasone (ELOCON) 0.1 % lotion Apply to affected areas of the scalp QOD ketoconazole (NIZORAL) 2 % shampoo Shampoo into the scalp and face. Let sit 5-10 minutes then wash off. Use 3d/wk. ketoconazole (NIZORAL) 2 % cream APPLY TO THE AFFECTED AREA OF THE FACE EVERY OTHER DAY ALTERNATING WITH HYDROCORTISONE CREAM. USE IN THE GROIN/INNER THIGH DAILY AS NEEDED FOR RASH hydrocortisone 2.5 % lotion APPLY TO THE AFFECTED AREA OF FACE EVERY OTHER DAY ALTERNATING WITH KETOCONAZOLE. APPLY TO LOWER LEGS AND ANKLES DAILY TO TWICE DAILY AS NEEDED Return in about 1 year (around 07/22/2024) for TBSE, hx of BCC.  IAngelique Holm,  CMA, am acting as scribe for Armida Sans, MD .   Documentation: I have reviewed the above documentation for accuracy and completeness, and I agree with the above.  Armida Sans, MD

## 2023-07-30 ENCOUNTER — Ambulatory Visit: Payer: Medicare PPO | Admitting: Dermatology

## 2023-08-18 DIAGNOSIS — H2513 Age-related nuclear cataract, bilateral: Secondary | ICD-10-CM | POA: Diagnosis not present

## 2023-08-18 DIAGNOSIS — H40003 Preglaucoma, unspecified, bilateral: Secondary | ICD-10-CM | POA: Diagnosis not present

## 2023-08-18 DIAGNOSIS — H353132 Nonexudative age-related macular degeneration, bilateral, intermediate dry stage: Secondary | ICD-10-CM | POA: Diagnosis not present

## 2023-08-18 DIAGNOSIS — E119 Type 2 diabetes mellitus without complications: Secondary | ICD-10-CM | POA: Diagnosis not present

## 2023-08-18 LAB — HM DIABETES EYE EXAM

## 2023-08-29 ENCOUNTER — Other Ambulatory Visit: Payer: Self-pay

## 2023-08-29 MED ORDER — ROSUVASTATIN CALCIUM 10 MG PO TABS: ORAL_TABLET | ORAL | 1 refills | Status: DC

## 2023-09-01 ENCOUNTER — Ambulatory Visit: Payer: Medicare PPO | Admitting: Internal Medicine

## 2023-09-09 ENCOUNTER — Emergency Department

## 2023-09-09 ENCOUNTER — Inpatient Hospital Stay
Admission: EM | Admit: 2023-09-09 | Discharge: 2023-09-12 | DRG: 177 | Disposition: A | Discharge status: Hospice | Attending: Internal Medicine | Admitting: Internal Medicine

## 2023-09-09 ENCOUNTER — Other Ambulatory Visit: Payer: Self-pay

## 2023-09-09 ENCOUNTER — Encounter: Payer: Self-pay | Admitting: Emergency Medicine

## 2023-09-09 ENCOUNTER — Ambulatory Visit: Payer: Self-pay

## 2023-09-09 DIAGNOSIS — I110 Hypertensive heart disease with heart failure: Secondary | ICD-10-CM | POA: Diagnosis present

## 2023-09-09 DIAGNOSIS — J984 Other disorders of lung: Secondary | ICD-10-CM | POA: Diagnosis not present

## 2023-09-09 DIAGNOSIS — N179 Acute kidney failure, unspecified: Secondary | ICD-10-CM | POA: Diagnosis present

## 2023-09-09 DIAGNOSIS — Z515 Encounter for palliative care: Secondary | ICD-10-CM | POA: Diagnosis not present

## 2023-09-09 DIAGNOSIS — R109 Unspecified abdominal pain: Secondary | ICD-10-CM | POA: Diagnosis not present

## 2023-09-09 DIAGNOSIS — K5981 Ogilvie syndrome: Secondary | ICD-10-CM | POA: Diagnosis not present

## 2023-09-09 DIAGNOSIS — R1312 Dysphagia, oropharyngeal phase: Secondary | ICD-10-CM | POA: Diagnosis present

## 2023-09-09 DIAGNOSIS — K567 Ileus, unspecified: Secondary | ICD-10-CM | POA: Diagnosis not present

## 2023-09-09 DIAGNOSIS — F418 Other specified anxiety disorders: Secondary | ICD-10-CM | POA: Diagnosis not present

## 2023-09-09 DIAGNOSIS — E119 Type 2 diabetes mellitus without complications: Secondary | ICD-10-CM | POA: Diagnosis present

## 2023-09-09 DIAGNOSIS — K5900 Constipation, unspecified: Secondary | ICD-10-CM | POA: Diagnosis present

## 2023-09-09 DIAGNOSIS — E785 Hyperlipidemia, unspecified: Secondary | ICD-10-CM | POA: Diagnosis present

## 2023-09-09 DIAGNOSIS — I639 Cerebral infarction, unspecified: Secondary | ICD-10-CM | POA: Diagnosis present

## 2023-09-09 DIAGNOSIS — J690 Pneumonitis due to inhalation of food and vomit: Secondary | ICD-10-CM | POA: Diagnosis not present

## 2023-09-09 DIAGNOSIS — J9601 Acute respiratory failure with hypoxia: Secondary | ICD-10-CM | POA: Diagnosis not present

## 2023-09-09 DIAGNOSIS — J9811 Atelectasis: Secondary | ICD-10-CM | POA: Diagnosis not present

## 2023-09-09 DIAGNOSIS — E872 Acidosis, unspecified: Secondary | ICD-10-CM | POA: Diagnosis not present

## 2023-09-09 DIAGNOSIS — I10 Essential (primary) hypertension: Secondary | ICD-10-CM | POA: Diagnosis present

## 2023-09-09 DIAGNOSIS — F419 Anxiety disorder, unspecified: Secondary | ICD-10-CM | POA: Diagnosis present

## 2023-09-09 DIAGNOSIS — I5032 Chronic diastolic (congestive) heart failure: Secondary | ICD-10-CM | POA: Diagnosis present

## 2023-09-09 DIAGNOSIS — R739 Hyperglycemia, unspecified: Secondary | ICD-10-CM | POA: Diagnosis not present

## 2023-09-09 DIAGNOSIS — F32A Depression, unspecified: Secondary | ICD-10-CM | POA: Diagnosis present

## 2023-09-09 DIAGNOSIS — M109 Gout, unspecified: Secondary | ICD-10-CM | POA: Diagnosis present

## 2023-09-09 DIAGNOSIS — R0902 Hypoxemia: Secondary | ICD-10-CM | POA: Diagnosis not present

## 2023-09-09 DIAGNOSIS — Z1152 Encounter for screening for COVID-19: Secondary | ICD-10-CM

## 2023-09-09 DIAGNOSIS — R0989 Other specified symptoms and signs involving the circulatory and respiratory systems: Secondary | ICD-10-CM | POA: Diagnosis not present

## 2023-09-09 DIAGNOSIS — Z79899 Other long term (current) drug therapy: Secondary | ICD-10-CM

## 2023-09-09 DIAGNOSIS — G459 Transient cerebral ischemic attack, unspecified: Secondary | ICD-10-CM | POA: Diagnosis not present

## 2023-09-09 DIAGNOSIS — Z841 Family history of disorders of kidney and ureter: Secondary | ICD-10-CM

## 2023-09-09 DIAGNOSIS — Z66 Do not resuscitate: Secondary | ICD-10-CM | POA: Diagnosis present

## 2023-09-09 DIAGNOSIS — I69351 Hemiplegia and hemiparesis following cerebral infarction affecting right dominant side: Secondary | ICD-10-CM

## 2023-09-09 DIAGNOSIS — E1122 Type 2 diabetes mellitus with diabetic chronic kidney disease: Secondary | ICD-10-CM | POA: Diagnosis not present

## 2023-09-09 DIAGNOSIS — R6520 Severe sepsis without septic shock: Secondary | ICD-10-CM | POA: Diagnosis not present

## 2023-09-09 DIAGNOSIS — Z823 Family history of stroke: Secondary | ICD-10-CM

## 2023-09-09 DIAGNOSIS — I700 Atherosclerosis of aorta: Secondary | ICD-10-CM | POA: Diagnosis not present

## 2023-09-09 DIAGNOSIS — Z681 Body mass index (BMI) 19 or less, adult: Secondary | ICD-10-CM

## 2023-09-09 DIAGNOSIS — Z8052 Family history of malignant neoplasm of bladder: Secondary | ICD-10-CM

## 2023-09-09 DIAGNOSIS — Z7984 Long term (current) use of oral hypoglycemic drugs: Secondary | ICD-10-CM

## 2023-09-09 DIAGNOSIS — R0602 Shortness of breath: Secondary | ICD-10-CM | POA: Diagnosis not present

## 2023-09-09 DIAGNOSIS — A419 Sepsis, unspecified organism: Secondary | ICD-10-CM

## 2023-09-09 DIAGNOSIS — H409 Unspecified glaucoma: Secondary | ICD-10-CM | POA: Diagnosis present

## 2023-09-09 DIAGNOSIS — Z8261 Family history of arthritis: Secondary | ICD-10-CM

## 2023-09-09 DIAGNOSIS — Z8249 Family history of ischemic heart disease and other diseases of the circulatory system: Secondary | ICD-10-CM

## 2023-09-09 DIAGNOSIS — Z7901 Long term (current) use of anticoagulants: Secondary | ICD-10-CM

## 2023-09-09 DIAGNOSIS — E876 Hypokalemia: Secondary | ICD-10-CM | POA: Diagnosis not present

## 2023-09-09 DIAGNOSIS — R1310 Dysphagia, unspecified: Secondary | ICD-10-CM | POA: Diagnosis not present

## 2023-09-09 DIAGNOSIS — Z85828 Personal history of other malignant neoplasm of skin: Secondary | ICD-10-CM

## 2023-09-09 DIAGNOSIS — E43 Unspecified severe protein-calorie malnutrition: Secondary | ICD-10-CM | POA: Diagnosis not present

## 2023-09-09 DIAGNOSIS — E870 Hyperosmolality and hypernatremia: Secondary | ICD-10-CM | POA: Diagnosis not present

## 2023-09-09 DIAGNOSIS — N400 Enlarged prostate without lower urinary tract symptoms: Secondary | ICD-10-CM | POA: Diagnosis not present

## 2023-09-09 DIAGNOSIS — R112 Nausea with vomiting, unspecified: Secondary | ICD-10-CM | POA: Diagnosis present

## 2023-09-09 DIAGNOSIS — I69391 Dysphagia following cerebral infarction: Secondary | ICD-10-CM

## 2023-09-09 DIAGNOSIS — E860 Dehydration: Secondary | ICD-10-CM | POA: Diagnosis present

## 2023-09-09 DIAGNOSIS — Z7401 Bed confinement status: Secondary | ICD-10-CM | POA: Diagnosis not present

## 2023-09-09 DIAGNOSIS — K4090 Unilateral inguinal hernia, without obstruction or gangrene, not specified as recurrent: Secondary | ICD-10-CM | POA: Diagnosis not present

## 2023-09-09 DIAGNOSIS — Z7902 Long term (current) use of antithrombotics/antiplatelets: Secondary | ICD-10-CM

## 2023-09-09 LAB — COMPREHENSIVE METABOLIC PANEL WITH GFR
ALT: 13 U/L (ref 0–44)
AST: 24 U/L (ref 15–41)
Albumin: 3.9 g/dL (ref 3.5–5.0)
Alkaline Phosphatase: 80 U/L (ref 38–126)
Anion gap: 10 (ref 5–15)
BUN: 50 mg/dL — ABNORMAL HIGH (ref 8–23)
CO2: 26 mmol/L (ref 22–32)
Calcium: 10 mg/dL (ref 8.9–10.3)
Chloride: 102 mmol/L (ref 98–111)
Creatinine, Ser: 1.56 mg/dL — ABNORMAL HIGH (ref 0.61–1.24)
GFR, Estimated: 43 mL/min — ABNORMAL LOW (ref >60)
Glucose, Bld: 268 mg/dL — ABNORMAL HIGH (ref 70–99)
Potassium: 3.7 mmol/L (ref 3.5–5.1)
Sodium: 138 mmol/L (ref 135–145)
Total Bilirubin: 1.5 mg/dL — ABNORMAL HIGH (ref 0.0–1.2)
Total Protein: 7.1 g/dL (ref 6.5–8.1)

## 2023-09-09 LAB — CBC
HCT: 45.8 % (ref 39.0–52.0)
Hemoglobin: 16.1 g/dL (ref 13.0–17.0)
MCH: 34 pg (ref 26.0–34.0)
MCHC: 35.2 g/dL (ref 30.0–36.0)
MCV: 96.8 fL (ref 80.0–100.0)
Platelets: 258 10*3/uL (ref 150–400)
RBC: 4.73 MIL/uL (ref 4.22–5.81)
RDW: 13.2 % (ref 11.5–15.5)
WBC: 23.2 10*3/uL — ABNORMAL HIGH (ref 4.0–10.5)
nRBC: 0 % (ref 0.0–0.2)

## 2023-09-09 LAB — LACTIC ACID, PLASMA
Lactic Acid, Venous: 2.2 mmol/L (ref 0.5–1.9)
Lactic Acid, Venous: 2.9 mmol/L (ref 0.5–1.9)
Lactic Acid, Venous: 2.4 mmol/L (ref 0.5–1.9)
Lactic Acid, Venous: 2.8 mmol/L (ref 0.5–1.9)

## 2023-09-09 LAB — URINALYSIS, ROUTINE W REFLEX MICROSCOPIC
Bacteria, UA: NONE SEEN
Bilirubin Urine: NEGATIVE
Glucose, UA: >=500 mg/dL — AB
Ketones, ur: NEGATIVE mg/dL
Leukocytes,Ua: NEGATIVE
Nitrite: NEGATIVE
Protein, ur: 30 mg/dL — AB
RBC / HPF: 0 RBC/hpf (ref 0–5)
Specific Gravity, Urine: 1.031 — ABNORMAL HIGH (ref 1.005–1.030)
pH: 5 (ref 5.0–8.0)

## 2023-09-09 LAB — RESP PANEL BY RT-PCR (RSV, FLU A&B, COVID)  RVPGX2
Influenza A by PCR: NEGATIVE
Influenza B by PCR: NEGATIVE
Resp Syncytial Virus by PCR: NEGATIVE
SARS Coronavirus 2 by RT PCR: NEGATIVE

## 2023-09-09 LAB — LIPASE, BLOOD: Lipase: 21 U/L (ref 11–51)

## 2023-09-09 LAB — APTT: aPTT: 28 s (ref 24–36)

## 2023-09-09 LAB — PROTIME-INR
INR: 1.2 (ref 0.8–1.2)
Prothrombin Time: 15.7 s — ABNORMAL HIGH (ref 11.4–15.2)

## 2023-09-09 LAB — BLOOD GAS, VENOUS

## 2023-09-09 LAB — GLUCOSE, CAPILLARY: Glucose-Capillary: 197 mg/dL — ABNORMAL HIGH (ref 70–99)

## 2023-09-09 LAB — BRAIN NATRIURETIC PEPTIDE: B Natriuretic Peptide: 76.8 pg/mL (ref 0.0–100.0)

## 2023-09-09 MED ORDER — ENSURE ENLIVE PO LIQD: 237.0000 mL | Freq: Two times a day (BID) | ORAL | Status: DC

## 2023-09-09 MED ORDER — INSULIN ASPART 100 UNIT/ML IJ SOLN
0.0000 [IU] | Freq: Three times a day (TID) | INTRAMUSCULAR | Status: DC
  Administered 2023-09-10: 2 [IU] via SUBCUTANEOUS
  Administered 2023-09-10 – 2023-09-11 (×4): 1 [IU] via SUBCUTANEOUS
  Filled 2023-09-09 (×5): qty 1

## 2023-09-09 MED ORDER — LACTATED RINGERS IV SOLN: INTRAVENOUS | Status: DC

## 2023-09-09 MED ORDER — SODIUM CHLORIDE 0.9 % IV SOLN
2.0000 g | Freq: Once | INTRAVENOUS | Status: AC
  Administered 2023-09-09: 2 g via INTRAVENOUS
  Filled 2023-09-09: qty 20

## 2023-09-09 MED ORDER — INSULIN ASPART 100 UNIT/ML IJ SOLN: 0.0000 [IU] | Freq: Every day | INTRAMUSCULAR | Status: DC

## 2023-09-09 MED ORDER — SODIUM CHLORIDE 0.9 % IV SOLN
500.0000 mg | Freq: Once | INTRAVENOUS | Status: AC
  Administered 2023-09-09: 500 mg via INTRAVENOUS
  Filled 2023-09-09: qty 5

## 2023-09-09 MED ORDER — DM-GUAIFENESIN ER 30-600 MG PO TB12
1.0000 | ORAL_TABLET | Freq: Two times a day (BID) | ORAL | Status: DC | PRN
Start: 2023-09-09 — End: 2023-09-12

## 2023-09-09 MED ORDER — LACTATED RINGERS IV BOLUS (SEPSIS)
500.0000 mL | Freq: Once | INTRAVENOUS | Status: AC
  Administered 2023-09-09: 500 mL via INTRAVENOUS

## 2023-09-09 MED ORDER — ONDANSETRON HCL 4 MG/2ML IJ SOLN
4.0000 mg | Freq: Three times a day (TID) | INTRAMUSCULAR | Status: DC | PRN
  Administered 2023-09-09 – 2023-09-10 (×2): 4 mg via INTRAVENOUS
  Filled 2023-09-09 (×2): qty 2

## 2023-09-09 MED ORDER — PANTOPRAZOLE SODIUM 40 MG IV SOLR
40.0000 mg | Freq: Two times a day (BID) | INTRAVENOUS | Status: DC
  Administered 2023-09-09 – 2023-09-11 (×5): 40 mg via INTRAVENOUS
  Filled 2023-09-09 (×5): qty 10

## 2023-09-09 MED ORDER — POLYETHYLENE GLYCOL 3350 17 G PO PACK: 17.0000 g | PACK | Freq: Every day | ORAL | Status: DC | PRN

## 2023-09-09 MED ORDER — METRONIDAZOLE 500 MG/100ML IV SOLN
500.0000 mg | Freq: Once | INTRAVENOUS | Status: AC
  Administered 2023-09-09: 500 mg via INTRAVENOUS
  Filled 2023-09-09: qty 100

## 2023-09-09 MED ORDER — LACTATED RINGERS IV BOLUS (SEPSIS)
1000.0000 mL | Freq: Once | INTRAVENOUS | Status: AC
  Administered 2023-09-09: 1000 mL via INTRAVENOUS

## 2023-09-09 MED ORDER — TIMOLOL MALEATE 0.5 % OP SOLN
1.0000 [drp] | Freq: Every day | OPHTHALMIC | Status: DC
  Administered 2023-09-10 – 2023-09-11 (×2): 1 [drp] via OPHTHALMIC
  Filled 2023-09-09: qty 5

## 2023-09-09 MED ORDER — ALPRAZOLAM 0.5 MG PO TABS
0.5000 mg | ORAL_TABLET | Freq: Every evening | ORAL | Status: DC | PRN
  Administered 2023-09-09: 0.5 mg via ORAL
  Filled 2023-09-09: qty 1

## 2023-09-09 MED ORDER — LACTATED RINGERS IV BOLUS (SEPSIS)
250.0000 mL | Freq: Once | INTRAVENOUS | Status: AC
  Administered 2023-09-09: 250 mL via INTRAVENOUS

## 2023-09-09 MED ORDER — ACETAMINOPHEN 325 MG PO TABS: 650.0000 mg | ORAL_TABLET | Freq: Four times a day (QID) | ORAL | Status: DC | PRN

## 2023-09-09 MED ORDER — ALBUTEROL SULFATE (2.5 MG/3ML) 0.083% IN NEBU: 3.0000 mL | INHALATION_SOLUTION | RESPIRATORY_TRACT | Status: DC | PRN

## 2023-09-09 MED ORDER — SENNOSIDES-DOCUSATE SODIUM 8.6-50 MG PO TABS: 1.0000 | ORAL_TABLET | Freq: Two times a day (BID) | ORAL | Status: DC

## 2023-09-09 MED ORDER — MIRTAZAPINE 15 MG PO TABS
15.0000 mg | ORAL_TABLET | Freq: Every day | ORAL | Status: DC
  Administered 2023-09-09 – 2023-09-10 (×2): 15 mg via ORAL
  Filled 2023-09-09 (×2): qty 1

## 2023-09-09 MED ORDER — HYDRALAZINE HCL 20 MG/ML IJ SOLN: 5.0000 mg | INTRAMUSCULAR | Status: DC | PRN

## 2023-09-09 MED ORDER — ROSUVASTATIN CALCIUM 10 MG PO TABS
10.0000 mg | ORAL_TABLET | Freq: Every day | ORAL | Status: DC
  Administered 2023-09-10: 10 mg via ORAL
  Filled 2023-09-09: qty 1

## 2023-09-09 MED ORDER — SODIUM CHLORIDE 0.9 % IV SOLN
3.0000 g | Freq: Two times a day (BID) | INTRAVENOUS | Status: DC
  Administered 2023-09-10 – 2023-09-11 (×4): 3 g via INTRAVENOUS
  Filled 2023-09-09 (×5): qty 8

## 2023-09-09 MED ORDER — LACTATED RINGERS IV BOLUS
500.0000 mL | Freq: Once | INTRAVENOUS | Status: AC
  Administered 2023-09-09: 1 mL via INTRAVENOUS

## 2023-09-09 MED ORDER — LATANOPROST 0.005 % OP SOLN
1.0000 [drp] | Freq: Every day | OPHTHALMIC | Status: DC
  Administered 2023-09-10 – 2023-09-11 (×2): 1 [drp] via OPHTHALMIC
  Filled 2023-09-09: qty 2.5

## 2023-09-09 MED ORDER — IOHEXOL 300 MG/ML  SOLN
75.0000 mL | Freq: Once | INTRAMUSCULAR | Status: AC | PRN
  Administered 2023-09-09: 75 mL via INTRAVENOUS

## 2023-09-09 NOTE — Consult Note (Signed)
 Pharmacy Antibiotic Note  Anthony Allen is a 88 y.o. male admitted on 09/09/2023 with  aspiration pneumonia .  Patient presented to the ED from home for N/V. They received antibiotics for code sepsis while in the ED. Pharmacy has been consulted for Unasyn dosing.  Plan: Unasyn 3g Q12H  Scr 1.56 (at baseline) Continue to follow culture data and LOT  Height: 5\' 10"  (177.8 cm) Weight: 54.4 kg (120 lb) IBW/kg (Calculated) : 73  Temp (24hrs), Avg:98.5 F (36.9 C), Min:98.3 F (36.8 C), Max:98.7 F (37.1 C)  Recent Labs  Lab 09/09/23 1249 09/09/23 1607 09/09/23 1853  WBC 23.2*  --   --   CREATININE 1.56*  --   --   LATICACIDVEN  --  2.2* 2.9*    Estimated Creatinine Clearance: 26.6 mL/min (A) (by C-G formula based on SCr of 1.56 mg/dL (H)).    No Known Allergies  Antimicrobials this admission: 4/1 Ceftriaxone x 1  4/1 Zithromax x 1 4/1 Flagyl x 1  Dose adjustments this admission: NA  Microbiology results: 4/1 BCx: In process  Thank you for allowing pharmacy to be a part of this patient's care.  Effie Shy, PharmD Pharmacy Resident  09/09/2023 7:38 PM

## 2023-09-09 NOTE — Progress Notes (Signed)
 Notified provider of need to order repeat lactic acid due to trending up.

## 2023-09-09 NOTE — ED Notes (Signed)
Sent 2nd lactic

## 2023-09-09 NOTE — Telephone Encounter (Signed)
  Chief Complaint: vomiting, abd pain Symptoms: "dark" emesis, poor po liq. Intake  Frequency: 3 days  Disposition: [x] ED /[] Urgent Care (no appt availability in office) / [] Appointment(In office/virtual)/ []  [facility]/ [] Home Care/ [] Refused Recommended Disposition /[] [facility]/ []  Follow-up with PCP Additional Notes: wife calling 911 Copied from CRM [phone]. Topic: Clinical - Red Word Triage >> Sep 09, 2023 10:09 AM Presley Raddle C wrote: Red Word that prompted transfer to Nurse Triage: throwing up for 2 days, can't eat or drink, stomach pain. Reason for Disposition  [1] Vomiting AND [2] contains red blood or black ("coffee ground") material  (Exception: Few red streaks in vomit that only happened once.)  Answer Assessment - Initial Assessment Questions 1. VOMITING SEVERITY: "How many times have you vomited in the past 24 hours?"     - MILD:  1 - 2 times/day    - MODERATE: 3 - 5 times/day, decreased oral intake without significant weight loss or symptoms of dehydration    - SEVERE: 6 or more times/day, vomits everything or nearly everything, with significant weight loss, symptoms of dehydration      Unsure  2. ONSET: "When did the vomiting begin?"      3 days  3. FLUIDS: "What fluids or food have you vomited up today?" "Have you been able to keep any fluids down?"     1 cup water per day  4. ABDOMEN PAIN: "Are your having any abdomen pain?" If Yes : "How bad is it and what does it feel like?" (e.g., crampy, dull, intermittent, constant)      yes 9. OTHER SYMPTOMS: "Do you have any other symptoms?" (e.g., fever, headache, vertigo, vomiting blood or coffee grounds, recent head injury)     Dark no BM x 4 days low grade 99- no more cup per day - dry heaving-"dark emesis  Protocols used: Vomiting-A-AH

## 2023-09-09 NOTE — ED Notes (Signed)
 TRANSPORT paged at this time to assist in transfer of pt to assigned in-patient room

## 2023-09-09 NOTE — Consult Note (Signed)
 CODE SEPSIS - PHARMACY COMMUNICATION  **Broad Spectrum Antibiotics should be administered within 1 hour of Sepsis diagnosis**  Time Code Sepsis Called/Page Received: 1537  Antibiotics Ordered: azithromycin, ceftriaxone, flagyl  Time of 1st antibiotic administration: 1614  Additional action taken by pharmacy: n/a  If necessary, Name of Provider/Nurse Contacted: n/a    Bettey Costa ,PharmD Clinical Pharmacist  09/09/2023  3:59 PM

## 2023-09-09 NOTE — H&P (Signed)
 History and Physical    Anthony Allen:[id number] DOB: May 26, 1936 DOA: 09/09/2023  Referring MD/NP/PA:   PCP: [name], MD   Patient coming from:  The patient is coming from home.     Chief Complaint: Nausea, vomiting, constipation, abdominal pain  HPI: Anthony Allen is a 88 y.o. male with medical history significant of HTN, HLD, DM, gout, stroke with right-sided weakness and dysphagia, secondary polycythemia, who presents nausea, vomiting, constipation abdominal pain.  Pt states that he has been constipated in the past 4 days, with last bowel movement at 4 days ago.  Pt has nausea, vomiting, abdominal pain for more than 2 days.  On the first day, patient had more than 5 times of nonbilious nonbloody vomiting, currently with dry heaves.  Patient states that since yesterday, his vomitus has become dark.  He also has abdominal pain, which is located in the lower abdomen, constant, aching, mild to moderate, nonradiating, not aggravated or alleviated by any known factors.  No fever or chills.  Pt has mild dry cough, denies SOB or chest pain.  No symptoms of UTI. He took his last dose of Plavix last night.  Data reviewed independently and ED Course: pt was found to have WBC 23.2, lactic acid 2.2 --> 2.9, hemoglobin stable 16.1, stable renal function, negative urinalysis, negative PCR for COVID, flu and RSV, temperature normal, blood pressure 96/80, 108/69, heart rate 92, 89, RR 20, oxygen saturation 97% on room air, which improved to 94% on 3 L oxygen (patient is not using oxygen normally).  Chest x-ray showed low volume, negative for infiltration.  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  Patient is admitted to telemetry bed as inpatient.  Dr. Rennis Petty GI is consulted.  Chest x-ray: Low lung volumes. Mild bibasilar atelectasis. Otherwise, no acute cardiopulmonary findings  CT of chest/abdomen/pelvis:: 1. No acute inflammatory process identified within the chest,  abdomen or pelvis. 2. There is dilation of the entire colon without obstructing mass. Findings favor colonic pseudo-obstruction-Ogilvie syndrome. 3. Multiple other nonacute observations, as described above.   Aortic Atherosclerosis (ICD10-I70.0).      EKG: I have personally reviewed.  Sinus rhythm, QTc 468, low voltage, Q waves in the inferior leads.   Review of Systems:   General: no fevers, chills, no body weight gain, has poor appetite, has fatigue HEENT: no blurry vision, hearing changes or sore throat Respiratory: no dyspnea, has coughing, no wheezing CV: no chest pain, no palpitations GI: has nausea, vomiting, abdominal pain, constipation GU: no dysuria, burning on urination, increased urinary frequency, hematuria  Ext: no leg edema Neuro: no unilateral weakness, numbness, or tingling, no vision change or hearing loss Skin: no rash, no skin tear. MSK: No muscle spasm, no deformity, no limitation of range of movement in spin Heme: No easy bruising.  Travel history: No recent long distant travel.   Allergy: No Known Allergies  Past Medical History:  Diagnosis Date   Arthritis    "mild in my hands" (06/04/2017)   Basal cell carcinoma 01/28/2008   L lat nose    Complication of anesthesia    "was told never to use Propofol because of parent's adverse reaction" (06/04/2017)   CVA (cerebral vascular accident) (HCC)    hospitalized from 12/22-12/24 due to left-sided weakness, slurred speech and difficulty swallowing. He was diagnosed as left pontine stroke./notes 06/03/2017   Family history of adverse reaction to anesthesia    "mother and dad had allergic reaction Propofol" (06/04/2017)   Glaucoma,  both eyes    Gout    Hypertension    Parotid mass    Stroke (cerebrum) (HCC) 06/03/2017   Type 2 diabetes mellitus with hyperglycemia (HCC)     Past Surgical History:  Procedure Laterality Date   TONSILLECTOMY  1943    Social History:  reports that he has never smoked.  He has never used smokeless tobacco. He reports current alcohol use. He reports that he does not use drugs.  Family History:  Family History  Problem Relation Age of Onset   Arthritis Mother    Stroke Father    Hypertension Father    Heart disease Father 14       AMI   Kidney disease Father        bladder ca congenital loss of kidney   Arthritis Maternal Grandmother    Early death Daughter 73       aspiration   Cancer Brother        Scalp   Macular degeneration Brother      Prior to Admission medications   Medication Sig Start Date End Date Taking? Authorizing Provider  acetaminophen (TYLENOL) 500 MG tablet Take 500 mg by mouth every 6 (six) hours as needed.    [provider]  ALPRAZolam (XANAX) 0.5 MG tablet TAKE 1 TABLET(0.5 MG) BY MOUTH AT BEDTIME AS NEEDED FOR ANXIETY 03/19/23   [name], MD  benzonatate (TESSALON PERLES) 100 MG capsule Take 2 capsules (200 mg total) by mouth 3 (three) times daily as needed for cough. 08/28/22   Dana Allan, MD  Cholecalciferol (VITAMIN D-3) 1000 units CAPS Take 1,000 Units by mouth daily.    [provider]  clindamycin (CLEOCIN-T) 1 % lotion Apply qd to back, let sit 5 minutes and rinse off 07/23/23   Deirdre Evener, MD  clopidogrel (PLAVIX) 75 MG tablet TAKE 1 TABLET(75 MG) BY MOUTH DAILY 06/02/23   [name], MD  dibucaine (NUPERCAINAL) 1 % ointment Apply topically 3 (three) times daily as needed for pain. 03/24/23   [name], MD  empagliflozin (JARDIANCE) 10 MG TABS tablet TAKE 1 TABLET(10 MG) BY MOUTH DAILY 05/20/23   [name], MD  fluticasone (FLONASE) 50 MCG/ACT nasal spray Place 2 sprays into both nostrils daily as needed for allergies or rhinitis.     [provider]  hydrocortisone 2.5 % lotion APPLY TO THE AFFECTED AREA OF FACE EVERY OTHER DAY ALTERNATING WITH KETOCONAZOLE. APPLY TO LOWER LEGS AND ANKLES DAILY TO TWICE DAILY AS NEEDED 07/23/23   Deirdre Evener, MD  ketoconazole  (NIZORAL) 2 % cream APPLY TO THE AFFECTED AREA OF THE FACE EVERY OTHER DAY ALTERNATING WITH HYDROCORTISONE CREAM. USE IN THE GROIN/INNER THIGH DAILY AS NEEDED FOR RASH 07/23/23   Deirdre Evener, MD  ketoconazole (NIZORAL) 2 % shampoo Shampoo into the scalp and face. Let sit 5-10 minutes then wash off. Use 3d/wk. 07/23/23   Deirdre Evener, MD  latanoprost (XALATAN) 0.005 % ophthalmic solution Instill 1 drop into both eyes in the evening 07/11/16   [provider]  losartan (COZAAR) 25 MG tablet TAKE 1 TABLET(25 MG) BY MOUTH DAILY 06/16/23   [name], MD  mirtazapine (REMERON) 15 MG tablet TAKE 1 TABLET(15 MG) BY MOUTH AT BEDTIME 05/12/23   [name], MD  mometasone (ELOCON) 0.1 % lotion Apply to affected areas of the scalp QOD 07/23/23   Deirdre Evener, MD  NYSTATIN powder APPLY TOPICALLY TWICE A DAY  AS NEEDED FOR RASH 10/13/18   [name], MD  rosuvastatin (CRESTOR) 10 MG tablet TAKE 1 TABLET(10 MG) BY MOUTH DAILY 08/29/23   [name], MD  saccharomyces boulardii (FLORASTOR) 250 MG capsule Take 1 capsule (250 mg total) by mouth daily. 08/27/22   Dana Allan, MD  timolol (TIMOPTIC) 0.5 % ophthalmic solution Instill 1 drop into the right eye in the morning 07/11/16   [provider]    Physical Exam: Vitals:   09/09/23 1722 09/09/23 1730 09/09/23 1800 09/09/23 1830  BP:  106/72 116/87 108/69  Pulse:  89 85 89  Resp:    18  Temp: 98.7 F (37.1 C)     TempSrc: Oral     SpO2:  97% 95% 94%  Weight:      Height:       General: Not in acute distress HEENT:       Eyes: PERRL, EOMI, no jaundice       ENT: No discharge from the ears and nose, no pharynx injection, no tonsillar enlargement.        Neck: No JVD, no bruit, no mass felt. Heme: No neck lymph node enlargement. Cardiac: S1/S2, RRR, No murmurs, No gallops or rubs. Respiratory: Has coarse breathing sound bilaterally GI: has tenderness in middle and lower abdomen, no rebound pain, no organomegaly,  BS present. GU: No hematuria Ext: No pitting leg edema bilaterally. 1+DP/PT pulse bilaterally. Musculoskeletal: No joint deformities, No joint redness or warmth, no limitation of ROM in spin. Skin: No rashes.  Neuro: Alert, oriented X3, cranial nerves II-XII grossly intact, moves all extremities normally.  Psych: Patient is not psychotic, no suicidal or hemocidal ideation.  Labs on Admission: I have personally reviewed following labs and imaging studies  CBC: Recent Labs  Lab 09/09/23 1249  WBC 23.2*  HGB 16.1  HCT 45.8  MCV 96.8  PLT 258   Basic Metabolic Panel: Recent Labs  Lab 09/09/23 1249  NA 138  K 3.7  CL 102  CO2 26  GLUCOSE 268*  BUN 50*  CREATININE 1.56*  CALCIUM 10.0   GFR: Estimated Creatinine Clearance: 26.6 mL/min (A) (by C-G formula based on SCr of 1.56 mg/dL (H)). Liver Function Tests: Recent Labs  Lab 09/09/23 1249  AST 24  ALT 13  ALKPHOS 80  BILITOT 1.5*  PROT 7.1  ALBUMIN 3.9   Recent Labs  Lab 09/09/23 1249  LIPASE 21   No results for input(s): "AMMONIA" in the last 168 hours. Coagulation Profile: Recent Labs  Lab 09/09/23 1608  INR 1.2   Cardiac Enzymes: No results for input(s): "CKTOTAL", "CKMB", "CKMBINDEX", "TROPONINI" in the last 168 hours. BNP (last 3 results) No results for input(s): "PROBNP" in the last 8760 hours. HbA1C: No results for input(s): "HGBA1C" in the last 72 hours. CBG: No results for input(s): "GLUCAP" in the last 168 hours. Lipid Profile: No results for input(s): "CHOL", "HDL", "LDLCALC", "TRIG", "CHOLHDL", "LDLDIRECT" in the last 72 hours. Thyroid Function Tests: No results for input(s): "TSH", "T4TOTAL", "FREET4", "T3FREE", "THYROIDAB" in the last 72 hours. Anemia Panel: No results for input(s): "VITAMINB12", "FOLATE", "FERRITIN", "TIBC", "IRON", "RETICCTPCT" in the last 72 hours. Urine analysis:    Component Value Date/Time   COLORURINE YELLOW (A) 09/09/2023 1608   APPEARANCEUR CLEAR (A)  09/09/2023 1608   LABSPEC 1.031 (H) 09/09/2023 1608   PHURINE 5.0 09/09/2023 1608   GLUCOSEU >=500 (A) 09/09/2023 1608   GLUCOSEU >=1000 (A) 03/10/2023 1342   HGBUR SMALL (A) 09/09/2023  1608   BILIRUBINUR NEGATIVE 09/09/2023 1608   KETONESUR NEGATIVE 09/09/2023 1608   PROTEINUR 30 (A) 09/09/2023 1608   UROBILINOGEN 0.2 03/10/2023 1342   NITRITE NEGATIVE 09/09/2023 1608   LEUKOCYTESUR NEGATIVE 09/09/2023 1608   Sepsis Labs: @LABRCNTIP (procalcitonin:4,lacticidven:4) ) Recent Results (from the past 240 hours)  Resp panel by RT-PCR (RSV, Flu A&B, Covid) Urine, Clean Catch     Status: None   Collection Time: 09/09/23  4:08 PM   Specimen: Urine, Clean Catch; Nasal Swab  Result Value Ref Range Status   SARS Coronavirus 2 by RT PCR NEGATIVE NEGATIVE Final    Comment: (NOTE) SARS-CoV-2 target nucleic acids are NOT DETECTED.  The SARS-CoV-2 RNA is generally detectable in upper respiratory specimens during the acute phase of infection. The lowest concentration of SARS-CoV-2 viral copies this assay can detect is 138 copies/mL. A negative result does not preclude SARS-Cov-2 infection and should not be used as the sole basis for treatment or other patient management decisions. A negative result may occur with  improper specimen collection/handling, submission of specimen other than nasopharyngeal swab, presence of viral mutation(s) within the areas targeted by this assay, and inadequate number of viral copies(<138 copies/mL). A negative result must be combined with clinical observations, patient history, and epidemiological information. The expected result is Negative.  Fact Sheet for Patients:  BloggerCourse.com  Fact Sheet for Healthcare Providers:  SeriousBroker.it  This test is no t yet approved or cleared by the [place] FDA and  has been authorized for detection and/or diagnosis of SARS-CoV-2 by FDA under an Emergency Use  Authorization (EUA). This EUA will remain  in effect (meaning this test can be used) for the duration of the COVID-19 declaration under Section 564(b)(1) of the Act, 21 U.S.C.section 360bbb-3(b)(1), unless the authorization is terminated  or revoked sooner.       Influenza A by PCR NEGATIVE NEGATIVE Final   Influenza B by PCR NEGATIVE NEGATIVE Final    Comment: (NOTE) The Xpert Xpress SARS-CoV-2/FLU/RSV plus assay is intended as an aid in the diagnosis of influenza from Nasopharyngeal swab specimens and should not be used as a sole basis for treatment. Nasal washings and aspirates are unacceptable for Xpert Xpress SARS-CoV-2/FLU/RSV testing.  Fact Sheet for Patients: BloggerCourse.com  Fact Sheet for Healthcare Providers: SeriousBroker.it  This test is not yet approved or cleared by the [place] FDA and has been authorized for detection and/or diagnosis of SARS-CoV-2 by FDA under an Emergency Use Authorization (EUA). This EUA will remain in effect (meaning this test can be used) for the duration of the COVID-19 declaration under Section 564(b)(1) of the Act, 21 U.S.C. section 360bbb-3(b)(1), unless the authorization is terminated or revoked.     Resp Syncytial Virus by PCR NEGATIVE NEGATIVE Final    Comment: (NOTE) Fact Sheet for Patients: BloggerCourse.com  Fact Sheet for Healthcare Providers: SeriousBroker.it  This test is not yet approved or cleared by the [place] FDA and has been authorized for detection and/or diagnosis of SARS-CoV-2 by FDA under an Emergency Use Authorization (EUA). This EUA will remain in effect (meaning this test can be used) for the duration of the COVID-19 declaration under Section 564(b)(1) of the Act, 21 U.S.C. section 360bbb-3(b)(1), unless the authorization is terminated or revoked.  Performed at [facility], [address] [place], [place] [address]      Radiological Exams on Admission:   Assessment/Plan Principal Problem:   Aspiration pneumonia [facility]) Active Problems:   Nausea & vomiting  Ogilvie syndrome   HTN (hypertension)   HLD (hyperlipidemia)   Chronic diastolic CHF (congestive heart failure) (HCC)   Stroke (HCC)   Type 2 diabetes mellitus with diabetic chronic kidney disease (HCC)   Depression with anxiety   Protein-calorie malnutrition, severe (HCC)   Assessment and Plan:  Aspiration pneumonia [facility]): Patient has mild cough, and 3 L new oxygen requirement.  Possibly due to early stage of aspiration pneumonia.  Patient has elevated lactic acid 2.2 --> 2.9, but currently patient does not meets criteria for sepsis (WBC 23.2, temperature 98.7, RR 20 --18, heart rate 89 currently).  His elevated lactic acid is likely due to dehydration.   - Will admit to tele bed as inpt - Unasyn (patient received 1 dose of Rocephin, azithromycin and Flagyl in ED) - Incentive spirometry - Mucinex for cough  - Bronchodilators - S. pneumococcal antigen - Follow up blood culture x2, sputum culture - Trend lactic acid level - IVF: total of 2.25 L of LR  bolus, followed by 75 mL per hour of LR - SLP   Nausea & vomiting and abdominal pain due to possible Ogilvie syndrome: Consulted Dr. Mia Creek for GI. -Supportive care and NPO now -IV fluid as above -As needed Zofran for nausea -Protonix 40 mg twice daily by IV -Avoid medications that can decrease colonic motility (eg, opiates, calcium channel blockers, medications with anticholinergic side effects) and avoidance of laxatives.  HTN (hypertension): Blood pressure soft at 96/80, 108/69 -Hold Cozaar -IV hydralazine as needed  HLD (hyperlipidemia) -Crestor  Chronic diastolic CHF (congestive heart failure) (HCC): 2D echo on 05/22/2017 showed EF of 65 to 70% with grade 1 diastolic dysfunction.  No leg edema or JVD.  CHF seem to be compensated. -check  BNP  Stroke [facility]): Has chronic right-sided weakness, difficulty swallowing -Hold Plavix due to vomiting all black vomitus -Crestor  Type 2 diabetes mellitus with diabetic chronic kidney disease (HCC): Recent A1c 5.4, well-controlled.  Patient taking Jardiance -Sliding scale insulin  Depression with anxiety -Continue home medications  Protein-calorie malnutrition, severe (HCC): Body weight 55.8 kg, BMI 17.65 -Nutrition consult -Ensure when able to eat      DVT ppx: SCD  Code Status:  DNR (I discussed with patient in the presence of his wife and explained the meaning of CODE STATUS. Patient wants to be DNR)  Family Communication:        Yes, patient's wife at bed side.   Disposition Plan:  Anticipate discharge back to previous environment  Consults called: Dr. Mia Creek of GI  Admission status and Level of care: Telemetry Medical: as inpt        Dispo: The patient is from: Home              Anticipated d/c is to: Home              Anticipated d/c date is: 2 days              Patient currently is not medically stable to d/c.    Severity of Illness:  The appropriate patient status for this patient is INPATIENT. Inpatient status is judged to be reasonable and necessary in order to provide the required intensity of service to ensure the patient's safety. The patient's presenting symptoms, physical exam findings, and initial radiographic and laboratory data in the context of their chronic comorbidities is felt to place them at high risk for further clinical deterioration. Furthermore, it is not anticipated that the patient will be medically stable for  discharge from the hospital within 2 midnights of admission.   * I certify that at the point of admission it is my clinical judgment that the patient will require inpatient hospital care spanning beyond 2 midnights from the point of admission due to high intensity of service, high risk for further deterioration and high frequency of  surveillance required.*       Date of Service 09/09/2023    Lorretta Harp [facility]   If 7PM-7AM, please contact night-coverage [email or web address] 09/09/2023, 8:22 PM

## 2023-09-09 NOTE — ED Triage Notes (Signed)
 Patient to ED via [facility] from home for N/V since Sunday night. Pt reports he has not had a BM since Friday. Vomit dark in color per EMS. 87% on RA- placed on [place] by EMS. States he is having stomach cramps.

## 2023-09-09 NOTE — ED Notes (Signed)
 Pt being transported to inpatient room at this time.

## 2023-09-09 NOTE — ED Provider Notes (Signed)
 [facility] Provider Note   Event Date/Time   First MD Initiated Contact with Patient 09/09/23 1501     (approximate) History  Emesis  HPI Anthony Allen is a 88 y.o. 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  Patient states that over the last day this vomitus has become dark.  Patient states he is on anticoagulation however he cannot tell me which 1.  Patient does admit to Plavix.  Patient also endorses bilateral lower abdominal quadrant pain.  Patient also endorses constipation over the last 4 days. ROS: Patient currently denies any vision changes, tinnitus, difficulty speaking, facial droop, sore throat, chest pain, shortness of breath, diarrhea, dysuria, or weakness/numbness/paresthesias in any extremity   Physical Exam  Triage Vital Signs: ED Triage Vitals  Encounter Vitals Group     BP 09/09/23 1245 116/82     Systolic BP Percentile --      Diastolic BP Percentile --      Pulse Rate 09/09/23 1245 92     Resp 09/09/23 1245 17     Temp 09/09/23 1245 98.3 F (36.8 C)     Temp Source 09/09/23 1245 Oral     SpO2 09/09/23 1245 (!) 87 %     Weight 09/09/23 1246 120 lb (54.4 kg)     Height 09/09/23 1246 5\' 10"  (1.778 m)     Head Circumference --      Peak Flow --      Pain Score 09/09/23 1246 0     Pain Loc --      Pain Education --      Exclude from Growth Chart --    Most recent vital signs: Vitals:   09/09/23 1830 09/09/23 2112  BP: 108/69 139/70  Pulse: 89 87  Resp: 18 20  Temp:  98 F (36.7 C)  SpO2: 94% 99%   General: Awake, oriented x4. CV:  Good peripheral perfusion.  Resp:  Normal effort.  Abd:  No distention.  Other:  A dentulous elderly well-developed, well-nourished Caucasian male resting comfortably in no acute distress ED Results / Procedures / Treatments   Labs (all labs ordered are listed, but only abnormal results are displayed) Labs Reviewed  COMPREHENSIVE METABOLIC PANEL WITH GFR - Abnormal; Notable for the following components:      Result Value   Glucose, Bld 268 (*)    BUN 50 (*)    Creatinine, Ser 1.56 (*)    Total Bilirubin 1.5 (*)    GFR, Estimated 43 (*)    All other components within normal limits  CBC - Abnormal; Notable for the following components:   WBC 23.2 (*)    All other components within normal limits  URINALYSIS, ROUTINE W REFLEX MICROSCOPIC - Abnormal; Notable for the following components:   Color, Urine YELLOW (*)    APPearance CLEAR (*)    Specific Gravity, Urine 1.031 (*)    Glucose, UA >=500 (*)    Hgb urine dipstick SMALL (*)    Protein, ur 30 (*)    All other components within normal limits  LACTIC ACID, PLASMA - Abnormal; Notable for the following components:   Lactic Acid, Venous 2.2 (*)    All other components within normal limits  LACTIC ACID, PLASMA - Abnormal; Notable for the following components:   Lactic Acid, Venous 2.9 (*)  All other components within normal limits  PROTIME-INR - Abnormal; Notable for the following components:   Prothrombin Time 15.7 (*)    All other components within normal limits  BLOOD GAS, VENOUS - Abnormal; Notable for the following components:   pO2, Ven <31 (*)    Bicarbonate 31.8 (*)    Acid-Base Excess 5.0 (*)    All other components within normal limits  LACTIC ACID, PLASMA - Abnormal; Notable for the following components:   Lactic Acid, Venous 2.4 (*)    All other components within normal limits  GLUCOSE, CAPILLARY - Abnormal; Notable for the following components:   Glucose-Capillary 197 (*)    All other components within normal limits  RESP PANEL BY RT-PCR (RSV, FLU A&B, COVID)  RVPGX2  CULTURE, BLOOD (ROUTINE X 2)  CULTURE, BLOOD (ROUTINE X 2)  EXPECTORATED SPUTUM ASSESSMENT W GRAM STAIN, RFLX TO RESP C  LIPASE, BLOOD  APTT  BRAIN NATRIURETIC  PEPTIDE  STREP PNEUMONIAE URINARY ANTIGEN  CBC  BASIC METABOLIC PANEL WITH GFR  LACTIC ACID, PLASMA  LACTIC ACID, PLASMA   EKG ED ECG REPORT I, Merwyn Katos, the attending physician, personally viewed and interpreted this ECG. Date: 09/09/2023 EKG Time: 1622 Rate: 86 Rhythm: normal sinus rhythm QRS Axis: normal Intervals: normal ST/T Wave abnormalities: normal Narrative Interpretation: no evidence of acute ischemia RADIOLOGY ED MD interpretation: CT of the chest abdomen and pelvis independently interpreted and shows no acute inflammatory process within the chest/abdomen/pelvis.  There is dilation of the entire colon without obstructing mass favoring colonic pseudoobstruction/Ogilvie syndrome.   Single view portable chest x-ray independently interpreted and shows low lung volumes with mild bibasilar atelectasis -Agree with radiology assessment Official radiology report(s): CT CHEST ABDOMEN PELVIS W CONTRAST Result Date: 09/09/2023 CLINICAL DATA:  Sepsis new o2 requirement, recent N/V, possible aspiration. EXAM: CT CHEST, ABDOMEN, AND PELVIS WITH CONTRAST TECHNIQUE: Multidetector CT imaging of the chest, abdomen and pelvis was performed following the standard protocol during bolus administration of intravenous contrast. RADIATION DOSE REDUCTION: This exam was performed according to the departmental dose-optimization program which includes automated exposure control, adjustment of the mA and/or kV according to patient size and/or use of iterative reconstruction technique. CONTRAST:  75mL OMNIPAQUE IOHEXOL 300 MG/ML  SOLN COMPARISON:  CT angiography chest from 01/26/2013. FINDINGS: CT CHEST FINDINGS Cardiovascular: Normal cardiac size. No pericardial effusion. No aortic aneurysm. There are coronary artery calcifications, in keeping with coronary artery disease. There are also moderate peripheral atherosclerotic vascular calcifications of thoracic aorta and its major branches. Mediastinum/Nodes:  Visualized thyroid gland appears grossly unremarkable. No solid / cystic mediastinal masses. There is fluid-filled and dilated esophagus, which is nonspecific but most likely seen in the settings of chronic gastroesophageal reflux disease versus esophageal dysmotility. No axillary, mediastinal or hilar lymphadenopathy by size criteria. Lungs/Pleura: The central tracheo-bronchial tree is patent. There are dependent changes as well as patchy atelectatic changes throughout bilateral lungs. No mass or consolidation. No pleural effusion or pneumothorax. There are 2, micronodules (less than 3 mm) in the left lung upper lobe (marked with electronic arrow sign on series 2006). No suspicious lung nodule. Musculoskeletal: The visualized soft tissues of the chest wall are grossly unremarkable. No suspicious osseous lesions. There are mild multilevel degenerative changes in the visualized spine. CT ABDOMEN PELVIS FINDINGS Hepatobiliary: The liver is normal in size. Non-cirrhotic configuration. No suspicious mass. These is mild diffuse hepatic steatosis. No intrahepatic or extrahepatic bile duct dilation. Very small volume dependent faintly calcified gallstones noted without imaging  signs of acute cholecystitis. Normal gallbladder wall thickness. No pericholecystic inflammatory changes. Pancreas: Unremarkable. No pancreatic ductal dilatation or surrounding inflammatory changes. Spleen: Within normal limits. No focal lesion. Adrenals/Urinary Tract: Adrenal glands are unremarkable. No suspicious renal mass. There are multiple bilateral subcentimeter hypoattenuating foci, which are too small to adequately characterize. No nephroureterolithiasis or obstructive uropathy. Unremarkable urinary bladder. Stomach/Bowel: There is dilation of the entire colon (diameter up to 7.7 cm) without obstructing mass. Findings favor Ogilvie syndrome. No dilation of small bowel loops. No evidence of abnormal bowel wall thickening or inflammatory  changes. The appendix is unremarkable. Vascular/Lymphatic: No ascites or pneumoperitoneum. No abdominal or pelvic lymphadenopathy, by size criteria. No aneurysmal dilation of the major abdominal arteries. There are moderate peripheral atherosclerotic vascular calcifications of the aorta and its major branches. Reproductive: Enlarged prostate. Correlate with serum PSA levels. There is a 4 x 6 mm hypoattenuating focus in the left side of the prostate mid gland, which may represent prostatic cyst. Symmetric seminal vesicles. Other: There is a small fat containing right inguinal hernia. The soft tissues and abdominal wall are otherwise unremarkable. Musculoskeletal: No suspicious osseous lesions. IMPRESSION: 1. No acute inflammatory process identified within the chest, abdomen or pelvis. 2. There is dilation of the entire colon without obstructing mass. Findings favor colonic pseudo-obstruction-Ogilvie syndrome. 3. Multiple other nonacute observations, as described above. Aortic Atherosclerosis (ICD10-I70.0). Electronically Signed   By: Jules Schick M.D.   On: 09/09/2023 18:05   DG Chest Port 1 View Result Date: 09/09/2023 CLINICAL DATA:  Shortness of breath.  Possible aspiration. EXAM: PORTABLE CHEST 1 VIEW COMPARISON:  Chest radiograph dated 01/26/2013. FINDINGS: Low lung volumes. The heart size and mediastinal contours are within normal limits. Aortic atherosclerosis. Mild bibasilar atelectasis. No focal consolidation, sizeable pleural effusion, or pneumothorax. No acute osseous abnormality. IMPRESSION: Low lung volumes. Mild bibasilar atelectasis. Otherwise, no acute cardiopulmonary findings Electronically Signed   By: Hart Robinsons M.D.   On: 09/09/2023 16:55   PROCEDURES: Critical Care performed: Yes, see critical care procedure note(s) .1-3 Lead EKG Interpretation  Performed by: Merwyn Katos, MD Authorized by: Merwyn Katos, MD     Interpretation: normal     ECG rate:  81   ECG rate  assessment: normal     Rhythm: sinus rhythm     Ectopy: none     Conduction: normal    CRITICAL CARE Performed by: Merwyn Katos   Total critical care time: 37 minutes  Critical care time was exclusive of separately billable procedures and treating other patients.  Critical care was necessary to treat or prevent imminent or life-threatening deterioration.  Critical care was time spent personally by me on the following activities: development of treatment plan with patient and/or surrogate as well as nursing, discussions with consultants, evaluation of patient's response to treatment, examination of patient, obtaining history from patient or surrogate, ordering and performing treatments and interventions, ordering and review of laboratory studies, ordering and review of radiographic studies, pulse oximetry and re-evaluation of patient's condition.  MEDICATIONS ORDERED IN ED: Medications  albuterol (PROVENTIL) (2.5 MG/3ML) 0.083% nebulizer solution 3 mL (has no administration in time range)  dextromethorphan-guaiFENesin (MUCINEX DM) 30-600 MG per 12 hr tablet 1 tablet (has no administration in time range)  ondansetron (ZOFRAN) injection 4 mg (4 mg Intravenous Given 09/09/23 2004)  hydrALAZINE (APRESOLINE) injection 5 mg (has no administration in time range)  acetaminophen (TYLENOL) tablet 650 mg (has no administration in time range)  lactated ringers infusion ( Intravenous  New Bag/Given 09/09/23 2208)  insulin aspart (novoLOG) injection 0-9 Units (has no administration in time range)  insulin aspart (novoLOG) injection 0-5 Units ( Subcutaneous Not Given 09/09/23 2121)  feeding supplement (ENSURE ENLIVE / ENSURE PLUS) liquid 237 mL (has no administration in time range)  Ampicillin-Sulbactam (UNASYN) 3 g in sodium chloride 0.9 % 100 mL IVPB (has no administration in time range)  pantoprazole (PROTONIX) injection 40 mg (40 mg Intravenous Given 09/09/23 2210)  rosuvastatin (CRESTOR) tablet 10 mg  (has no administration in time range)  ALPRAZolam (XANAX) tablet 0.5 mg (0.5 mg Oral Given 09/09/23 2247)  mirtazapine (REMERON) tablet 15 mg (15 mg Oral Given 09/09/23 2246)  latanoprost (XALATAN) 0.005 % ophthalmic solution 1 drop (has no administration in time range)  timolol (TIMOPTIC) 0.5 % ophthalmic solution 1 drop (has no administration in time range)  lactated ringers bolus 1,000 mL (0 mLs Intravenous Stopped 09/09/23 1752)    And  lactated ringers bolus 500 mL (0 mLs Intravenous Stopped 09/09/23 1749)    And  lactated ringers bolus 250 mL (0 mLs Intravenous Stopped 09/09/23 1749)  cefTRIAXone (ROCEPHIN) 2 g in sodium chloride 0.9 % 100 mL IVPB (0 g Intravenous Stopped 09/09/23 1648)  azithromycin (ZITHROMAX) 500 mg in sodium chloride 0.9 % 250 mL IVPB (0 mg Intravenous Stopped 09/09/23 1902)  metroNIDAZOLE (FLAGYL) IVPB 500 mg (0 mg Intravenous Stopped 09/09/23 1752)  iohexol (OMNIPAQUE) 300 MG/ML solution 75 mL (75 mLs Intravenous Contrast Given 09/09/23 1634)  lactated ringers bolus 500 mL (1 mL Intravenous New Bag/Given 09/09/23 2050)   IMPRESSION / MDM / ASSESSMENT AND PLAN / ED COURSE  I reviewed the triage vital signs and the nursing notes.                             The patient is on the cardiac monitor to evaluate for evidence of arrhythmia and/or significant heart rate changes. Patient's presentation is most consistent with acute presentation with potential threat to life or bodily function. The Pt presents with vomiting and tachypnea highly concerning for sepsis (suspected pulmonary versus intra-abdominal source). At this time, the Pt is satting well on 2 L nasal cannula, normotensive, and appears HDS.  Will start empiric antibiotics and fluids.  Due to lactic acidosis, will administer fluids gradually with frequent reassessment. Have low suspicion for a GI, skin/soft tissue, or CNS source at this time, but will reconsider if initial workup is unremarkable.  - CBC, BMP, LFTs - VBG - UA -  BCx x2, Lactate - EKG - CXR - CT chest/abdomen/pelvis - Empiric Abx: Rocephin, azithromycin, Flagyl - Fluids: 1 L lactated Ringer's Given patient's new oxygen requirement and concern for possible aspiration pneumonia, he will require admission to the internal medicine service for further evaluation and management Dispo: Admit to medicine   FINAL CLINICAL IMPRESSION(S) / ED DIAGNOSES   Final diagnoses:  Acute respiratory failure with hypoxia (HCC)  Sepsis with acute hypoxic respiratory failure without septic shock, due to unspecified organism [facility])   Rx / DC Orders   ED Discharge Orders     None      Note:  This document was prepared using Dragon voice recognition software and may include unintentional dictation errors.   Merwyn Katos, MD 09/09/23 [phone]

## 2023-09-09 NOTE — Sepsis Progress Note (Signed)
 Sepsis protocol monitored by eLink ?

## 2023-09-10 DIAGNOSIS — K5981 Ogilvie syndrome: Secondary | ICD-10-CM | POA: Diagnosis not present

## 2023-09-10 DIAGNOSIS — J690 Pneumonitis due to inhalation of food and vomit: Secondary | ICD-10-CM | POA: Diagnosis not present

## 2023-09-10 DIAGNOSIS — I5032 Chronic diastolic (congestive) heart failure: Secondary | ICD-10-CM | POA: Diagnosis not present

## 2023-09-10 LAB — BASIC METABOLIC PANEL WITH GFR
Anion gap: 11 (ref 5–15)
BUN: 45 mg/dL — ABNORMAL HIGH (ref 8–23)
CO2: 23 mmol/L (ref 22–32)
Calcium: 8.6 mg/dL — ABNORMAL LOW (ref 8.9–10.3)
Chloride: 102 mmol/L (ref 98–111)
Creatinine, Ser: 1.15 mg/dL (ref 0.61–1.24)
GFR, Estimated: >60 mL/min (ref >60)
Glucose, Bld: 183 mg/dL — ABNORMAL HIGH (ref 70–99)
Potassium: 3.4 mmol/L — ABNORMAL LOW (ref 3.5–5.1)
Sodium: 136 mmol/L (ref 135–145)

## 2023-09-10 LAB — CBC
HCT: 36.3 % — ABNORMAL LOW (ref 39.0–52.0)
Hemoglobin: 12.8 g/dL — ABNORMAL LOW (ref 13.0–17.0)
MCH: 34.5 pg — ABNORMAL HIGH (ref 26.0–34.0)
MCHC: 35.3 g/dL (ref 30.0–36.0)
MCV: 97.8 fL (ref 80.0–100.0)
Platelets: 150 10*3/uL (ref 150–400)
RBC: 3.71 MIL/uL — ABNORMAL LOW (ref 4.22–5.81)
RDW: 13.2 % (ref 11.5–15.5)
WBC: 11.6 10*3/uL — ABNORMAL HIGH (ref 4.0–10.5)
nRBC: 0 % (ref 0.0–0.2)

## 2023-09-10 LAB — GLUCOSE, CAPILLARY
Glucose-Capillary: 164 mg/dL — ABNORMAL HIGH (ref 70–99)
Glucose-Capillary: 130 mg/dL — ABNORMAL HIGH (ref 70–99)
Glucose-Capillary: 146 mg/dL — ABNORMAL HIGH (ref 70–99)
Glucose-Capillary: 141 mg/dL — ABNORMAL HIGH (ref 70–99)

## 2023-09-10 LAB — LACTIC ACID, PLASMA
Lactic Acid, Venous: 2 mmol/L (ref 0.5–1.9)
Lactic Acid, Venous: 1.2 mmol/L (ref 0.5–1.9)

## 2023-09-10 MED ORDER — POTASSIUM CHLORIDE IN NACL 20-0.9 MEQ/L-% IV SOLN
INTRAVENOUS | Status: DC
  Filled 2023-09-10 (×4): qty 1000

## 2023-09-10 MED ORDER — KCL-LACTATED RINGERS 20 MEQ/L IV SOLN: INTRAVENOUS | Status: DC

## 2023-09-10 MED ORDER — ORAL CARE MOUTH RINSE: 15.0000 mL | OROMUCOSAL | Status: DC | PRN

## 2023-09-10 NOTE — Plan of Care (Signed)
  Problem: Skin Integrity: Goal: Risk for impaired skin integrity will decrease Outcome: Progressing   Problem: Activity: Goal: Risk for activity intolerance will decrease Outcome: Progressing   Problem: Pain Managment: Goal: General experience of comfort will improve and/or be controlled Outcome: Progressing   Problem: Safety: Goal: Ability to remain free from injury will improve Outcome: Progressing   Problem: Skin Integrity: Goal: Risk for impaired skin integrity will decrease Outcome: Progressing   Problem: Activity: Goal: Ability to tolerate increased activity will improve Outcome: Progressing

## 2023-09-10 NOTE — Plan of Care (Addendum)
 Patient is alert and oriented X 4. He is in npo until barium swallow study.IVF is going as order. Helped him to go to bathroom with walker. Denies any pain. Gave medicine for nausea. Plan of care ongoing.  Problem: Education: Goal: Ability to describe self-care measures that may prevent or decrease complications (Diabetes Survival Skills Education) will improve Outcome: Progressing Goal: Individualized Educational Video(s) Outcome: Progressing   Problem: Coping: Goal: Ability to adjust to condition or change in health will improve Outcome: Progressing   Problem: Fluid Volume: Goal: Ability to maintain a balanced intake and output will improve Outcome: Progressing   Problem: Metabolic: Goal: Ability to maintain appropriate glucose levels will improve Outcome: Progressing   Problem: Nutritional: Goal: Maintenance of adequate nutrition will improve Outcome: Progressing Goal: Progress toward achieving an optimal weight will improve Outcome: Progressing   Problem: Skin Integrity: Goal: Risk for impaired skin integrity will decrease Outcome: Progressing   Problem: Tissue Perfusion: Goal: Adequacy of tissue perfusion will improve Outcome: Progressing   Problem: Education: Goal: Knowledge of General Education information will improve Description: Including pain rating scale, medication(s)/side effects and non-pharmacologic comfort measures Outcome: Progressing

## 2023-09-10 NOTE — Hospital Course (Signed)
 Anthony Allen is a 88 y.o. male with medical history significant of HTN, HLD, DM, gout, stroke with right-sided weakness and dysphagia, secondary polycythemia, who presents nausea, vomiting, constipation abdominal pain.  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  Patient had a modified swallow eval, aspirated on all textures. Discussed with patient and wife, they still want patient to eat, but start comfort care and follow with hospice.

## 2023-09-10 NOTE — Progress Notes (Signed)
 Initial Nutrition Assessment  DOCUMENTATION CODES:   Underweight, Severe malnutrition in context of chronic illness  INTERVENTION:   -RD will follow for diet advancement and goals of care and make further recommendations as appropriate  NUTRITION DIAGNOSIS:   Severe Malnutrition related to chronic illness (stroke and dysphagia) as evidenced by severe fat depletion, severe muscle depletion.  GOAL:   Patient will meet greater than or equal to 90% of their needs  MONITOR:   Diet advancement  REASON FOR ASSESSMENT:   Consult Assessment of nutrition requirement/status  ASSESSMENT:   Pt with medical history significant of HTN, HLD, DM, gout, stroke with right-sided weakness and dysphagia, secondary polycythemia, who presents nausea, vomiting, constipation abdominal pain.  Pt admitted with aspiration pneumonia, severe dysphagia, and nausea, vomiting, and abdominal pain due to possible Oglivie syndrome.   Reviewed I/O's: +60 ml x 24 hours  Per GI notes, pt with colonic pseudoobstruction. Plan for supportive care for the next 72 hours. Pt does not desire any invasive procedures.   Spoke with pt and wife at bedside. Pt deferred most of the history to his wife. Pt has experienced a general decline in health over the past 6 years, when he suffered a stroke (which affected the pons region). She shares that pt never ate much at baseline and was always slender in stature, but intake has gradually declined since the stroke. Pt consumes a minced diet at baseline and wife also supplements with favorite foods such as cheesecake and 1-2 Ensure supplements per day. She acknowledges that pt is at high aspiration risk.   Wife shares that pt's sister in law is a Engineer, civil (consulting) and has been given permission to weigh in on medical decisions. Pt wife shares that they do not want any invasive procedures or feeding tubes. Wife is considering MBSS, but unsure if she desires this as it would likely not impact to  outcome of decision making (she would like for pt to be able to eat favorite foods as he pleases and accept aspiration risk). She is hopeful for favorable outcome, but understands that decisions will need to be made once testing is completed. RD provided emotional support and encouraged continued discussions.   Reviewed wt hx; pt has experienced a 2.4% wt loss over the past 6 months, which is not significant for time frame.   Medications reviewed and include remeron, protonix, and 0.9% NaCl with KCl 20 mEq/L infusion @ 75 ml/hr.   Lab Results  Component Value Date   HGBA1C 5.4 03/03/2023   PTA DM medications are none.   Labs reviewed: K: 3.4, CBGS: 130-197 (inpatient orders for glycemic control are 0-5 units insulin aspart daily at bedtime and 0-9 units insulin aspart TID with meals).    NUTRITION - FOCUSED PHYSICAL EXAM:  Flowsheet Row Most Recent Value  Orbital Region Severe depletion  Upper Arm Region Severe depletion  Thoracic and Lumbar Region Severe depletion  Buccal Region Severe depletion  Temple Region Severe depletion  Clavicle Bone Region Severe depletion  Clavicle and Acromion Bone Region Severe depletion  Scapular Bone Region Severe depletion  Dorsal Hand Severe depletion  Patellar Region Severe depletion  Anterior Thigh Region Severe depletion  Posterior Calf Region Severe depletion  Edema (RD Assessment) None  Hair Reviewed  Eyes Reviewed  Mouth Reviewed  Skin Reviewed  Nails Reviewed       Diet Order:   Diet Order             Diet NPO time specified  Diet  effective now                   EDUCATION NEEDS:   Education needs have been addressed  Skin:  Skin Assessment: Reviewed RN Assessment  Last BM:  09/05/23  Height:   Ht Readings from Last 1 Encounters:  09/09/23 5\' 10"  (1.778 m)    Weight:   Wt Readings from Last 1 Encounters:  09/09/23 55.8 kg    Ideal Body Weight:  75.5 kg  BMI:  Body mass index is 17.65 kg/m.  Estimated  Nutritional Needs:   Kcal:  1650-1850  Protein:  85-100 grams  Fluid:  1.6-1.8 L    Levada Schilling, RD, LDN, CDCES Registered Dietitian III Certified Diabetes Care and Education Specialist If unable to reach this RD, please use "RD Inpatient" group chat on secure chat between hours of 8am-4 pm daily

## 2023-09-10 NOTE — Progress Notes (Addendum)
 Progress Note   Patient: Anthony Allen QMV:[id number] DOB: [birth year] DOA: 09/09/2023     1 DOS: the patient was seen and examined on 09/10/2023   Brief hospital course: Anthony Allen is a 88 y.o. male with medical history significant of HTN, HLD, DM, gout, stroke with right-sided weakness and dysphagia, secondary polycythemia, who presents nausea, vomiting, constipation abdominal pain.  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   Principal Problem:   Aspiration pneumonia (HCC) Active Problems:   Nausea & vomiting   Ogilvie syndrome   HTN (hypertension)   HLD (hyperlipidemia)   Chronic diastolic CHF (congestive heart failure) (HCC)   Stroke (HCC)   Type 2 diabetes mellitus with diabetic chronic kidney disease (HCC)   Depression with anxiety   Protein-calorie malnutrition, severe (HCC)   Assessment and Plan: Aspiration pneumonia (HCC):  Severe dysphagia. Patient has evidence of early aspiration pneumonia.  Continue Unasyn for now.  He has oxygen requirement, but no documented hypoxemia.  No sepsis. Patient has been evaluated by speech therapy, patient has high risk for aspiration.  He is placed on strict NPO.  Will continue IV fluids pending modified barium swallow.  Hypokalemia  potassium added to IV fluids.     Nausea & vomiting and abdominal pain due to possible Ogilvie syndrome:  Patient had a significant nausea vomiting, CT scan showed evidence of Ogilvie syndrome.  Patient has been seen by GI, recommended observation for next 72 hours.  Continue IV fluids.  Patient is n.p.o. due to severe aspiration risk.  HTN (hypertension):  Oral blood pressure medicine on hold.    HLD (hyperlipidemia) -Crestor   Chronic diastolic CHF (congestive heart failure) (HCC): 2D echo on 05/22/2017 showed EF of 65 to 70% with grade 1  diastolic dysfunction.  No leg edema or JVD.  CHF seem to be compensated. No evidence of CHF exacerbation.  Will follow closely as patient receiving gentle rehydration.   History of stroke (HCC) right-sided hemiparesis: Has chronic right-sided weakness, difficulty swallowing Continue to follow.   Type 2 diabetes mellitus with diabetic chronic kidney disease (HCC): Recent A1c 5.4, well-controlled.  Patient taking Jardiance -Sliding scale insulin   Depression with anxiety Patient is not able to swallow medicines.   Protein-calorie malnutrition, severe (HCC): Body weight 55.8 kg, BMI 17.65 -Nutrition consult -Ensure when able to eat         Subjective:  Patient had a significant dysphagia, not able to swallow.  No abdominal pain, no bowel movement for the last few days.  Physical Exam: Vitals:   09/10/23 0018 09/10/23 0417 09/10/23 0820 09/10/23 1141  BP: 114/65 (!) 108/59 (!) 102/51 107/62  Pulse: 78 77 77 66  Resp:   18 20  Temp: 98.1 F (36.7 C) 97.8 F (36.6 C) 98 F (36.7 C) 97.8 F (36.6 C)  TempSrc: Oral Oral    SpO2: 96% 95% 95% 96%  Weight:      Height:       General exam: Appears calm and comfortable  Respiratory system: Clear to auscultation. Respiratory effort normal. Cardiovascular system: S1 & S2 heard, RRR. No JVD, murmurs, rubs, gallops or clicks. No pedal edema. Gastrointestinal system: Abdomen is nondistended, soft and nontender. No organomegaly or masses felt. Normal bowel sounds heard. Central nervous system: Alert and oriented x2.  Right Hemiparesis. Extremities: Symmetric 5 x 5 power. Skin: No rashes, lesions or ulcers  Psychiatry: Mood & affect appropriate.    Data Reviewed:  Reviewed CT scan results and lab results.  Family Communication: Family meeting patient in patient room: Explained patient condition, will await for results of modified barium swallow.  Family and patient does not want a feeding tube if patient cannot swallow.  May  transition to comfort care and liberate diet if patient cannot tolerate any food texture.   Disposition: Status is: Inpatient Remains inpatient appropriate because: Severity of disease, IV treatment.     Time spent: 55 minutes  Author: Marrion Coy, MD 09/10/2023 1:18 PM  For on call review [email or web address].

## 2023-09-10 NOTE — Evaluation (Signed)
 Clinical/Bedside Swallow Evaluation Patient Details  Name: Anthony Allen MRN: [id number] Date of Birth: 1936-01-02  Today's Date: 09/10/2023 Time: SLP Start Time (ACUTE ONLY): 1148 SLP Stop Time (ACUTE ONLY): 1205 SLP Time Calculation (min) (ACUTE ONLY): 17 min  Past Medical History:  Past Medical History:  Diagnosis Date   Arthritis    "mild in my hands" (06/04/2017)   Basal cell carcinoma 01/28/2008   L lat nose    Complication of anesthesia    "was told never to use Propofol because of parent's adverse reaction" (06/04/2017)   CVA (cerebral vascular accident) (HCC)    hospitalized from 12/22-12/24 due to left-sided weakness, slurred speech and difficulty swallowing. He was diagnosed as left pontine stroke./notes 06/03/2017   Family history of adverse reaction to anesthesia    "mother and dad had allergic reaction Propofol" (06/04/2017)   Glaucoma, both eyes    Gout    Hypertension    Parotid mass    Stroke (cerebrum) (HCC) 06/03/2017   Type 2 diabetes mellitus with hyperglycemia (HCC)    Past Surgical History:  Past Surgical History:  Procedure Laterality Date   TONSILLECTOMY  1943   HPI:  Anthony Allen is a 88 y.o. male with medical history significant of HTN, HLD, DM, gout, left pontine stroke with right-sided weakness and dysphagia, polycythemia, who presents with nausea, vomiting, constipation, abdominal pain. Chest x-ray showed low volume, negative for infiltration.  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       Most recent MRI dated 06/01/2017 revealed akcute left pontine infarct, remote right corona radiata infarct with atrophic right midbrain and pons attributed to wallerian changes, chronic small vessel ischemic gliosis in the cerebral white matter.  Per neurology Delia Heady, MD) note on 06/04/2017 "Wife describes over 2-year history of gait abnormality, hypophonia and cognitive decline."       Pt known to ST services with bedside  swallow evaluation provided on 06/01/2017 and 06/04/2017 both recommending nectar thick liquids d/t coughing when consuming thin liquids. Per chart, instrumental swallow study has not been performed as of this date.   This Clinical research associate spoke with pt's wife over the phone today to inquire more about pt's dysphagia since 2018. She reports some follow up ST services following discharge as well as most recently pt received HHST. Unable to view notes from these visits in [facility]. She reports that "he progressed to thins with him thinking about it. He would always get strangled with it (thins) but had a good cough. He has always aspirated some, he has lost interested in eating d/t decreased desire to eat as well as losing his teeth, despite cutting food up for him in tiny pieces, it takes him so long to eat that he fatigues and losses interest in consuming after 1 tablespoon, he mostly drinks Ensure and I am barely getting 2 in him everyday." She further states that "he is not capable of improving but so much and he needs to be comfortable as possible." Pt's treatment team made aware of this information.    Assessment / Plan / Recommendation  Clinical Impression  Upon entering pt's room for bedside swallow evaluation, pt was consuming ice chips with his nurse present. She voiced concern regarding overt s/s of aspiration when consuming them. Skilled observation was provided of pt consuming several additional ice chips. At this time, he presents with increased risk of aspiration d/t significant oropharyngeal dysphagia.       Pt demonstrates moderate hypophonia that is hoarse in  quality with reduced vocal intensity. His cough is weak and appears unproductive during this session. He endorses increased coughing when consuming thin liquids prior to admission.   Specifically during this evaluation, he demonstrates increased vocal wetness and immediate coughing. At this time, a safe diet cannot be recommended at bedside and pt  would benefit from instrumental swallow study. In the interim, orders placed for strict NPO.       This Clinical research associate spoke with pt's wife via phone after the evaluation was completed. Current overt s/s of aspiration were explained, order for NPO as well as reocmmendation for Modified Barium Swallow Study to be completed prior to diet initiation. She had questions which were answered to the best of this writer's ability. Wife in agreement with current plan.   SLP Visit Diagnosis: Dysphagia, oropharyngeal phase (R13.12)    Aspiration Risk  Severe aspiration risk;Risk for inadequate nutrition/hydration    Diet Recommendation NPO    Medication Administration: Via alternative means    Other  Recommendations Oral Care Recommendations: Oral care QID    Recommendations for follow up therapy are one component of a multi-disciplinary discharge planning process, led by the attending physician.  Recommendations may be updated based on patient status, additional functional criteria and insurance authorization.  Follow up Recommendations  (Palliative Care Referral)      Assistance Recommended at Discharge  N/A  Functional Status Assessment Patient has had a recent decline in their functional status and/or demonstrates limited ability to make significant improvements in function in a reasonable and predictable amount of time  Frequency and Duration min 2x/week  2 weeks       Prognosis Prognosis for improved oropharyngeal function: Guarded Barriers to Reach Goals: Cognitive deficits;Time post onset;Severity of deficits (previous Pontine CVA)      Swallow Study   General Date of Onset: 09/09/23 HPI: Anthony Allen is a 88 y.o. male with medical history significant of HTN, HLD, DM, gout, left pontine stroke with right-sided weakness and dysphagia, polycythemia, who presents with nausea, vomiting, constipation, abdominal pain. Chest x-ray showed low volume, negative for infiltration.  CT of  chest/abdomen/pelvis showed possible Ogilvie's syndrome.     Most recent MRI dated 06/01/2017 revealed akcute left pontine infarct, remote right corona radiata infarct with atrophic right midbrain and pons attributed to wallerian changes, chronic small vessel ischemic gliosis in the cerebral white matter.  Per neurology Delia Heady, MD) note on 06/04/2017 "Wife describes over 2-year history of gait abnormality, hypophonia and cognitive decline."     Pt known to ST services with bedside swallow evaluation provided on 06/01/2017 and 06/04/2017 both recommending nectar thick liquids d/t coughing when consuming thin liquids. Per chart, instrumental swallow study has not been performed as of this date. This Clinical research associate spoke with pt's wife over the phone today to inquire more about pt's dysphagia since 2018. She reports some follow up ST services following discharge as well as most recently pt received HHST. Unable to view notes from these visits in EPIC. She reports that he "he progressed to thins with him thinking about it. He would always get strangled with it (thins) but had a good cough. He has always aspirated some, he has lost interested in eating d/t decreased desire to eat as well as losing his teeth, despite cutting food up for him in tiny pieces, it takes him so long to eat that he fatigues and losses interest in consuming after 1 tablespoon, he mostly drinks Ensure and I am barely getting 2 in  him everyday." She further states that "he is not capable of improving but so much and he needs to be comfortable as possible." Pt's treatment team made aware of this information. Type of Study: Bedside Swallow Evaluation Previous Swallow Assessment: 2018 Diet Prior to this Study: NPO Temperature Spikes Noted: No Respiratory Status: Nasal cannula History of Recent Intubation: No Behavior/Cognition: Alert Oral Cavity Assessment: Within Functional Limits Oral Care Completed by SLP: No Oral Cavity - Dentition:  Edentulous Vision: Functional for self-feeding Self-Feeding Abilities: Able to feed self Patient Positioning: Upright in bed Baseline Vocal Quality: Wet;Breathy;Hoarse;Low vocal intensity Volitional Cough: Weak;Wet Volitional Swallow: Unable to elicit    Oral/Motor/Sensory Function     Ice Chips Ice chips: Impaired Presentation: Self Fed;Spoon Pharyngeal Phase Impairments: Suspected delayed Swallow;Decreased hyoid-laryngeal movement;Multiple swallows;Wet Vocal Quality;Cough - Immediate;Throat Clearing - Immediate   Thin Liquid Thin Liquid: Not tested    Nectar Thick Nectar Thick Liquid: Not tested   Honey Thick Honey Thick Liquid: Not tested   Puree Puree: Not tested   Solid     Solid: Not tested     Latonia Conrow B. Dreama Saa, M.S., CCC-SLP, Tree surgeon Certified Brain Injury Specialist [facility]  [facility] Rehabilitation Services Office [phone] Ascom [phone] Fax [phone]

## 2023-09-10 NOTE — Consult Note (Signed)
 Anthony Minium, MD [facility]  [address] [place], [place] [address] Phone: [phone] Fax : [phone]  Consultation  Referring Provider:     Dr. Clyde Lundborg Primary Care Physician:  [name], MD Primary Gastroenterologist:  Gentry Fitz         Reason for Consultation:     Colonic pseudoobstruction  Date of Admission:  09/09/2023 Date of Consultation:  09/10/2023         HPI:   Anthony Allen is a 88 y.o. male with a history of a stroke and poor p.o. intake with history of aspiration and dysphagia.  The patient's wife has reported that the patient does not want a feeding tube.  She has also reported that she does not want any invasive procedures.  The patient came in due to suspected aspiration pneumonia and had imaging that showed the following:  IMPRESSION: 1. No acute inflammatory process identified within the chest, abdomen or pelvis. 2. There is dilation of the entire colon without obstructing mass. Findings favor colonic pseudo-obstruction-Ogilvie syndrome. 3. Multiple other nonacute observations, as described above.   Aortic Atherosclerosis  The patient denies any severe abdominal pain.  The CT scan of the abdomen reports that the diameter of the colon is up to 7.7 cm. The patient's wife also reports that the patient has not been keeping up with nutrition due to losing his teeth bed due to his dysphagia.  I am now being asked to see the patient for his colonic pseudoobstruction.  From   Past Medical History:  Diagnosis Date   Arthritis    "mild in my hands" (06/04/2017)   Basal cell carcinoma 01/28/2008   L lat nose    Complication of anesthesia    "was told never to use Propofol because of parent's adverse reaction" (06/04/2017)   CVA (cerebral vascular accident) (HCC)    hospitalized from 12/22-12/24 due to left-sided weakness, slurred speech and difficulty swallowing. He was diagnosed as left pontine stroke./notes 06/03/2017   Family history of adverse  reaction to anesthesia    "mother and dad had allergic reaction Propofol" (06/04/2017)   Glaucoma, both eyes    Gout    Hypertension    Parotid mass    Stroke (cerebrum) (HCC) 06/03/2017   Type 2 diabetes mellitus with hyperglycemia (HCC)     Past Surgical History:  Procedure Laterality Date   TONSILLECTOMY  1943    Prior to Admission medications   Medication Sig Start Date End Date Taking? Authorizing Provider  acetaminophen (TYLENOL) 500 MG tablet Take 500 mg by mouth every 6 (six) hours as needed for mild pain (pain score 1-3) or moderate pain (pain score 4-6).   Yes [provider]  ALPRAZolam (XANAX) 0.5 MG tablet TAKE 1 TABLET(0.5 MG) BY MOUTH AT BEDTIME AS NEEDED FOR ANXIETY Patient taking differently: Take 0.5 mg by mouth at bedtime as needed for anxiety. TAKE 1 TABLET(0.5 MG) BY MOUTH AT BEDTIME AS NEEDED FOR ANXIETY 03/19/23  Yes [name], MD  clopidogrel (PLAVIX) 75 MG tablet TAKE 1 TABLET(75 MG) BY MOUTH DAILY 06/02/23  Yes [name], MD  empagliflozin (JARDIANCE) 10 MG TABS tablet TAKE 1 TABLET(10 MG) BY MOUTH DAILY Patient taking differently: Take 10 mg by mouth daily. TAKE 1 TABLET(10 MG) BY MOUTH DAILY 05/20/23  Yes [name], MD  latanoprost (XALATAN) 0.005 % ophthalmic solution Instill 1 drop into both eyes in the evening 07/11/16  Yes [provider]  losartan (COZAAR) 25 MG tablet TAKE 1 TABLET(25 MG) BY MOUTH DAILY 06/16/23  Yes [name], MD  mirtazapine (REMERON) 15 MG tablet TAKE 1 TABLET(15 MG) BY MOUTH AT BEDTIME 05/12/23  Yes [name], MD  rosuvastatin (CRESTOR) 10 MG tablet TAKE 1 TABLET(10 MG) BY MOUTH DAILY 08/29/23  Yes [name], MD  timolol (TIMOPTIC) 0.5 % ophthalmic solution Instill 1 drop into the right eye in the morning 07/11/16  Yes [provider]  Cholecalciferol (VITAMIN D-3) 1000 units CAPS Take 1,000 Units by mouth daily.    [provider]  fluticasone (FLONASE) 50 MCG/ACT nasal  spray Place 2 sprays into both nostrils daily as needed for allergies or rhinitis.     [provider]  hydrocortisone 2.5 % lotion APPLY TO THE AFFECTED AREA OF FACE EVERY OTHER DAY ALTERNATING WITH KETOCONAZOLE. APPLY TO LOWER LEGS AND ANKLES DAILY TO TWICE DAILY AS NEEDED 07/23/23   Deirdre Evener, MD  ketoconazole (NIZORAL) 2 % cream APPLY TO THE AFFECTED AREA OF THE FACE EVERY OTHER DAY ALTERNATING WITH HYDROCORTISONE CREAM. USE IN THE GROIN/INNER THIGH DAILY AS NEEDED FOR RASH 07/23/23   Deirdre Evener, MD  ketoconazole (NIZORAL) 2 % shampoo Shampoo into the scalp and face. Let sit 5-10 minutes then wash off. Use 3d/wk. Patient taking differently: Apply 1 Application topically as directed. Shampoo into the scalp and face. Let sit 5-10 minutes then wash off. Use 3d/wk/PRN 07/23/23   Deirdre Evener, MD  mometasone (ELOCON) 0.1 % lotion Apply to affected areas of the scalp QOD 07/23/23   Deirdre Evener, MD    Family History  Problem Relation Age of Onset   Arthritis Mother    Stroke Father    Hypertension Father    Heart disease Father 75       AMI   Kidney disease Father        bladder ca congenital loss of kidney   Arthritis Maternal Grandmother    Early death Daughter 90       aspiration   Cancer Brother        Scalp   Macular degeneration Brother      Social History   Tobacco Use   Smoking status: Never   Smokeless tobacco: Never  Vaping Use   Vaping status: Never Used  Substance Use Topics   Alcohol use: Yes    Comment: 1 glass of wine per month   Drug use: No    Allergies as of 09/09/2023   (No Known Allergies)    Review of Systems:    All systems reviewed and negative except where noted in HPI.   Physical Exam:  Vital signs in last 24 hours: Temp:  [97.8 F (36.6 C)-98.7 F (37.1 C)] 98 F (36.7 C) (04/02 0820) Pulse Rate:  [77-92] 77 (04/02 0820) Resp:  [15-20] 18 (04/02 0820) BP: (96-139)/(51-87) 102/51 (04/02 0820) SpO2:  [87 %-99 %]  95 % (04/02 0820) Weight:  [54.4 kg-55.8 kg] 55.8 kg (04/01 2112) Last BM Date : 09/05/23 General:   Pleasant, cooperative in NAD Head:  Normocephalic and atraumatic. Eyes:   No icterus.   Conjunctiva pink. PERRLA. Ears:  Normal auditory acuity. Neck:  Supple; no masses or thyroidomegaly Lungs: Respirations even and unlabored. Lungs clear to auscultation bilaterally.   No wheezes, crackles, or rhonchi.  Heart:  Regular rate and rhythm;  Without murmur, clicks, rubs or gallops Abdomen:  Soft, nondistended, mild tenderness.  Decreased bowel sounds. No  appreciable masses or hepatomegaly.  No rebound or guarding.  Rectal:  Not performed. Msk:  Symmetrical without gross deformities.   Skin:  Intact without significant lesions or rashes. Cervical Nodes:  No significant cervical adenopathy. Psych:  Alert and cooperative. Normal affect.  LAB RESULTS: Recent Labs    09/09/23 1249 09/10/23 0108  WBC 23.2* 11.6*  HGB 16.1 12.8*  HCT 45.8 36.3*  PLT 258 150   BMET Recent Labs    09/09/23 1249 09/10/23 0108  NA 138 136  K 3.7 3.4*  CL 102 102  CO2 26 23  GLUCOSE 268* 183*  BUN 50* 45*  CREATININE 1.56* 1.15  CALCIUM 10.0 8.6*   LFT Recent Labs    09/09/23 1249  PROT 7.1  ALBUMIN 3.9  AST 24  ALT 13  ALKPHOS 80  BILITOT 1.5*   PT/INR Recent Labs    09/09/23 1608  LABPROT 15.7*  INR 1.2    STUDIES: CT CHEST ABDOMEN PELVIS W CONTRAST Result Date: 09/09/2023 CLINICAL DATA:  Sepsis new o2 requirement, recent N/V, possible aspiration. EXAM: CT CHEST, ABDOMEN, AND PELVIS WITH CONTRAST TECHNIQUE: Multidetector CT imaging of the chest, abdomen and pelvis was performed following the standard protocol during bolus administration of intravenous contrast. RADIATION DOSE REDUCTION: This exam was performed according to the departmental dose-optimization program which includes automated exposure control, adjustment of the mA and/or kV according to patient size and/or use of iterative  reconstruction technique. CONTRAST:  75mL OMNIPAQUE IOHEXOL 300 MG/ML  SOLN COMPARISON:  CT angiography chest from 01/26/2013. FINDINGS: CT CHEST FINDINGS Cardiovascular: Normal cardiac size. No pericardial effusion. No aortic aneurysm. There are coronary artery calcifications, in keeping with coronary artery disease. There are also moderate peripheral atherosclerotic vascular calcifications of thoracic aorta and its major branches. Mediastinum/Nodes: Visualized thyroid gland appears grossly unremarkable. No solid / cystic mediastinal masses. There is fluid-filled and dilated esophagus, which is nonspecific but most likely seen in the settings of chronic gastroesophageal reflux disease versus esophageal dysmotility. No axillary, mediastinal or hilar lymphadenopathy by size criteria. Lungs/Pleura: The central tracheo-bronchial tree is patent. There are dependent changes as well as patchy atelectatic changes throughout bilateral lungs. No mass or consolidation. No pleural effusion or pneumothorax. There are 2, micronodules (less than 3 mm) in the left lung upper lobe (marked with electronic arrow sign on series 2006). No suspicious lung nodule. Musculoskeletal: The visualized soft tissues of the chest wall are grossly unremarkable. No suspicious osseous lesions. There are mild multilevel degenerative changes in the visualized spine. CT ABDOMEN PELVIS FINDINGS Hepatobiliary: The liver is normal in size. Non-cirrhotic configuration. No suspicious mass. These is mild diffuse hepatic steatosis. No intrahepatic or extrahepatic bile duct dilation. Very small volume dependent faintly calcified gallstones noted without imaging signs of acute cholecystitis. Normal gallbladder wall thickness. No pericholecystic inflammatory changes. Pancreas: Unremarkable. No pancreatic ductal dilatation or surrounding inflammatory changes. Spleen: Within normal limits. No focal lesion. Adrenals/Urinary Tract: Adrenal glands are unremarkable.  No suspicious renal mass. There are multiple bilateral subcentimeter hypoattenuating foci, which are too small to adequately characterize. No nephroureterolithiasis or obstructive uropathy. Unremarkable urinary bladder. Stomach/Bowel: There is dilation of the entire colon (diameter up to 7.7 cm) without obstructing mass. Findings favor Ogilvie syndrome. No dilation of small bowel loops. No evidence of abnormal bowel wall thickening or inflammatory changes. The appendix is unremarkable. Vascular/Lymphatic: No ascites or pneumoperitoneum. No abdominal or pelvic lymphadenopathy, by size criteria. No aneurysmal dilation of the major abdominal arteries. There are moderate peripheral atherosclerotic vascular  calcifications of the aorta and its major branches. Reproductive: Enlarged prostate. Correlate with serum PSA levels. There is a 4 x 6 mm hypoattenuating focus in the left side of the prostate mid gland, which may represent prostatic cyst. Symmetric seminal vesicles. Other: There is a small fat containing right inguinal hernia. The soft tissues and abdominal wall are otherwise unremarkable. Musculoskeletal: No suspicious osseous lesions. IMPRESSION: 1. No acute inflammatory process identified within the chest, abdomen or pelvis. 2. There is dilation of the entire colon without obstructing mass. Findings favor colonic pseudo-obstruction-Ogilvie syndrome. 3. Multiple other nonacute observations, as described above. Aortic Atherosclerosis (ICD10-I70.0). Electronically Signed   By: Jules Schick M.D.   On: 09/09/2023 18:05   DG Chest Port 1 View Result Date: 09/09/2023 CLINICAL DATA:  Shortness of breath.  Possible aspiration. EXAM: PORTABLE CHEST 1 VIEW COMPARISON:  Chest radiograph dated 01/26/2013. FINDINGS: Low lung volumes. The heart size and mediastinal contours are within normal limits. Aortic atherosclerosis. Mild bibasilar atelectasis. No focal consolidation, sizeable pleural effusion, or pneumothorax. No  acute osseous abnormality. IMPRESSION: Low lung volumes. Mild bibasilar atelectasis. Otherwise, no acute cardiopulmonary findings Electronically Signed   By: Hart Robinsons M.D.   On: 09/09/2023 16:55      Impression / Plan:   Assessment: Principal Problem:   Aspiration pneumonia (HCC) Active Problems:   Type 2 diabetes mellitus with diabetic chronic kidney disease (HCC)   Stroke (HCC)   HTN (hypertension)   HLD (hyperlipidemia)   Chronic diastolic CHF (congestive heart failure) (HCC)   Nausea & vomiting   Depression with anxiety   Protein-calorie malnutrition, severe (HCC)   Ogilvie syndrome   DARWYN PONZO is a 88 y.o. y/o male with a CT scan showing pseudoobstruction with a colon dilated to a maximum of 7.7 cm.  The patient has not had any recent electrolytes sent off.  The wife is stated that the decision has been made not to be aggressive without any aggressive interventions or invasive tests.  Plan:  This patient should have electrolyte abnormalities corrected as this is a common cause for Ogilvie syndrome.  The algorithm for treatment of Ogilvie syndrome has been placed below and has been explained to the patient's wife.  At this time supportive care for 72 hours will be instituted prior to any further recommendations.  The patient's wife has been explained the plan and agrees with it.  Pseudo-obstruction-Ogilvie syndrome    Thank you for involving me in the care of this patient.      LOS: 1 day   Anthony Minium, MD, MD. Clementeen Graham 09/10/2023, 10:08 AM,  Pager [phone] 7am-5pm  Check AMION for 5pm -7am coverage and on weekends   Note: This dictation was prepared with Dragon dictation along with smaller phrase technology. Any transcriptional errors that result from this process are unintentional.

## 2023-09-11 ENCOUNTER — Other Ambulatory Visit: Payer: Self-pay

## 2023-09-11 ENCOUNTER — Inpatient Hospital Stay

## 2023-09-11 DIAGNOSIS — K5981 Ogilvie syndrome: Secondary | ICD-10-CM | POA: Diagnosis not present

## 2023-09-11 DIAGNOSIS — I69391 Dysphagia following cerebral infarction: Secondary | ICD-10-CM | POA: Diagnosis not present

## 2023-09-11 DIAGNOSIS — J690 Pneumonitis due to inhalation of food and vomit: Secondary | ICD-10-CM | POA: Diagnosis not present

## 2023-09-11 LAB — BLOOD GAS, VENOUS
Bicarbonate: 31.8 mmol/L — ABNORMAL HIGH (ref 20.0–28.0)
O2 Saturation: 32.8 mmol/L — ABNORMAL HIGH (ref 0.0–2.0)
Patient temperature: 37
Patient temperature: 37 %
pCO2, Ven: 55 mmHg (ref 44–60)
pH, Ven: 7.37 (ref 7.25–7.43)
pO2, Ven: <31.8 mmol/L — CL (ref 32–45)

## 2023-09-11 LAB — GLUCOSE, CAPILLARY
Glucose-Capillary: 138 mg/dL — ABNORMAL HIGH (ref 70–99)
Glucose-Capillary: 102 mg/dL — ABNORMAL HIGH (ref 70–99)
Glucose-Capillary: 125 mg/dL — ABNORMAL HIGH (ref 70–99)
Glucose-Capillary: 103 mg/dL — ABNORMAL HIGH (ref 70–99)

## 2023-09-11 LAB — BASIC METABOLIC PANEL WITH GFR
Anion gap: 11 (ref 5–15)
BUN: 47 mg/dL — ABNORMAL HIGH (ref 8–23)
CO2: 22 mmol/L (ref 22–32)
Calcium: 8.6 mg/dL — ABNORMAL LOW (ref 8.9–10.3)
Chloride: 112 mmol/L — ABNORMAL HIGH (ref 98–111)
Creatinine, Ser: 1.21 mg/dL (ref 0.61–1.24)
GFR, Estimated: 59 mL/min — ABNORMAL LOW (ref >60)
Glucose, Bld: 129 mg/dL — ABNORMAL HIGH (ref 70–99)
Potassium: 3.8 mmol/L (ref 3.5–5.1)
Sodium: 145 mmol/L (ref 135–145)

## 2023-09-11 LAB — MAGNESIUM: Magnesium: 2.4 mg/dL (ref 1.7–2.4)

## 2023-09-11 NOTE — Progress Notes (Addendum)
 Progress Note   Patient: Anthony Allen WGN:[id number] DOB: 09-Aug-1935 DOA: 09/09/2023     2 DOS: the patient was seen and examined on 09/11/2023   Brief hospital course: Anthony Allen is a 88 y.o. male with medical history significant of HTN, HLD, DM, gout, stroke with right-sided weakness and dysphagia, secondary polycythemia, who presents nausea, vomiting, constipation abdominal pain.  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   Principal Problem:   Aspiration pneumonia (HCC) Active Problems:   Nausea & vomiting   Ogilvie syndrome   HTN (hypertension)   HLD (hyperlipidemia)   Chronic diastolic CHF (congestive heart failure) (HCC)   Stroke (HCC)   Type 2 diabetes mellitus with diabetic chronic kidney disease (HCC)   Depression with anxiety   Protein-calorie malnutrition, severe (HCC)   Assessment and Plan: Aspiration pneumonia (HCC):  Severe dysphagia. Patient has evidence of early aspiration pneumonia.  Continue Unasyn for now.  He has oxygen requirement, but no documented hypoxemia.  No sepsis. Patient has been evaluated by speech therapy, patient has high risk for aspiration.  He is placed on strict NPO.   He is scheduled to have modified barium swallow today, continue IV fluids. Per discussion with family and the patient yesterday, if patient cannot swallow, patient does not want a feeding tube.  Will make a decision after barium swallow if patient need to be on hospice care.  Addendum.  Discussed with speech therapy, patient failed modified barium swallow.  Still recommend strict NPO.  Continue IV fluid.  Will decide by tomorrow if patient will go with comfort care versus G-tube.   Hypokalemia  AKI Patient had cr of 1.56 at admission from dehydration, Improved after fluids.     Nausea & vomiting and abdominal pain due  to possible Ogilvie syndrome:  Patient had a significant nausea vomiting, CT scan showed evidence of Ogilvie syndrome.  Patient has been seen by GI, recommended observation for next 72 hours.  Continue IV fluids.  Patient is n.p.o. due to severe aspiration risk. Patient still has not had a bowel movement, but no abdominal pain or nausea vomiting.   HTN (hypertension):  Oral blood pressure medicine on hold.     HLD (hyperlipidemia) -Crestor   Chronic diastolic CHF (congestive heart failure) (HCC): 2D echo on 05/22/2017 showed EF of 65 to 70% with grade 1 diastolic dysfunction.  No leg edema or JVD.  CHF seem to be compensated. Gentle rehydration, no volume overload.   History of stroke (HCC) right-sided hemiparesis: Has chronic right-sided weakness, difficulty swallowing Continue to follow.   Type 2 diabetes mellitus with diabetic chronic kidney disease (HCC): Recent A1c 5.4, well-controlled.  Patient taking Jardiance -Sliding scale insulin   Depression with anxiety Patient is not able to swallow medicines.   Protein-calorie malnutrition, severe (HCC): Body weight 55.8 kg, BMI 17.65 -Nutrition consult -Ensure when able to eat       Subjective:  Patient slept well last night, no abdominal pain or nausea vomiting.  No bowel movement.  Physical Exam: Vitals:   09/10/23 2025 09/11/23 0300 09/11/23 0500 09/11/23 0720  BP: (!) 95/55 101/66  (!) 92/47  Pulse: 79 84  71  Resp: 18 18  16   Temp: 97.7 F (36.5 C) 98.4 F (36.9 C)  97.8 F (36.6 C)  TempSrc: Oral Oral    SpO2: 92% 95%  92%  Weight:  59.1 kg   Height:       General exam: Appears calm and comfortable  Respiratory system: Clear to auscultation. Respiratory effort normal. Cardiovascular system: S1 & S2 heard, RRR. No JVD, murmurs, rubs, gallops or clicks. No pedal edema. Gastrointestinal system: Abdomen is nondistended, soft and nontender. No organomegaly or masses felt. Normal bowel sounds heard. Central  nervous system: Alert and oriented x3. No focal neurological deficits. Extremities: Symmetric 5 x 5 power. Skin: No rashes, lesions or ulcers Psychiatry:  Mood & affect appropriate.    Data Reviewed:  Results reviewed.  Family Communication: Discussed with the family about modified barium swallow, patient failed the test with significant aspiration.  Still not able to eat.  Family will discussed with the patient and every family member to decide about the next step of action.  They will decide if comfort care with a diet or a G-tube. Will get back to me tomorrow.   Disposition: Status is: Inpatient Remains inpatient appropriate because: Severity of disease, IV treatment.     Time spent: 35 minutes  Author: Marrion Coy, MD 09/11/2023 10:19 AM  For on call review [email or web address].

## 2023-09-11 NOTE — Procedures (Signed)
 Modified Barium Swallow Study  Patient Details  Name: Anthony Allen MRN: [id number] Date of Birth: 1935-11-08  Today's Date: 09/11/2023  Modified Barium Swallow completed.  Full report located under Chart Review in the Imaging Section.  History of Present Illness Anthony Allen is a 88 y.o. male with medical history significant of HTN, HLD, DM, gout, left pontine stroke with right-sided weakness and dysphagia, polycythemia, who presents with nausea, vomiting, constipation, abdominal pain. Chest x-ray showed low volume, negative for infiltration.  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     Most recent MRI dated 06/01/2017 revealed akcute left pontine infarct, remote right corona radiata infarct with atrophic right midbrain and pons attributed to wallerian changes, chronic small vessel ischemic gliosis in the cerebral white matter.  Per neurology Delia Heady, MD) note on 06/04/2017 "Wife describes over 2-year history of gait abnormality, hypophonia and cognitive decline."     Pt known to ST services with bedside swallow evaluation provided on 06/01/2017 and 06/04/2017 both recommending nectar thick liquids d/t coughing when consuming thin liquids. Per chart, instrumental swallow study has not been performed as of this date. This Clinical research associate spoke with pt's wife over the phone today to inquire more about pt's dysphagia since 2018. She reports some follow up ST services following discharge as well as most recently pt received HHST. Unable to view notes from these visits in EPIC. She reports that he "he progressed to thins with him thinking about it. He would always get strangled with it (thins) but had a good cough. He has always aspirated some, he has lost interested in eating d/t decreased desire to eat as well as losing his teeth, despite cutting food up for him in tiny pieces, it takes him so long to eat that he fatigues and losses interest in consuming after 1 tablespoon, he  mostly drinks Ensure and I am barely getting 2 in him everyday." She further states that "he is not capable of improving but so much and he needs to be comfortable as possible." Pt's treatment team made aware of this information.   Clinical Impression Pt presents with severe to profound sensorimotor oropharyngeal dysphagia and is at a high risk of aspiration when consuming PO intake.  Given his wife's report of continued overt s/s of aspiration (for details see clinical impression statement for Bedside Swallow Evaluation 09/10/2023) suspect pt's dysphagia is chronic and likely related to previous pontine stroke.       Pt did appear more alert after being transferred to radiology chair. As such he was able to participate in an oral motor exam which revealed rotten teeth broken off at the gums which appeared red and swollen.   When consuming thin liquids and nectar thick liquids via spoon pt presented with the following deficits:      Severe Oral Phase Impairments   Oral holding  Disorganized bolus preparation   Repetitive/disordered tongue motion   Decreased oral containment of boluses resulting in premature spillage   Oral residue collection on tongue and palate following swallow      Severe Pharyngeal Impairments   Minimal superior movement of thyroid cartilage with minimal approximation of arytenoids to epiglottic petiole   Partial to Minimal anterior hyoid excursion   Partial inversion and occasional no inversion of epiglottic   Incomplete laryngeal vestibule closure resulted in deep penetration of all consistencies   Minimal to absent pharyngeal stripping wave with majority of bolus remaining in the pharynx hanging over pt's ariway - pt with gasping  behavior post swallow and was not sensate to residue, even with mutlipe cues and mutlipe swallows, pt unable to reduce residue - minimal to occasional no pharyngeal clearance - study was discontinued d/t fear that pt would gasp air inhaling large  amounts of the pharyngeal residue. Comenpensatory strategies of head turns to left and right were ineffective in increasing glottal closure or reducing residue. Pt's inability to clear pharyngeal residue aligns with his wife's report of ineffeciency when consuming 1 tablespoon of finely diced food.       At this time, given the above, a safe diet cannot be recommended.  Factors that may increase risk of adverse event in presence of aspiration [employer] 2021): Poor general health and/or compromised immunity;Respiratory or GI disease;Reduced cognitive function;Frail or deconditioned;Inadequate oral hygiene;Weak cough;Aspiration of thick, dense, and/or acidic materials;Reduced saliva  Swallow Evaluation Recommendations Recommendations: NPO Medication Administration: Via alternative means Swallowing strategies  : Minimize environmental distractions;Slow rate;Small bites/sips Oral care recommendations: Oral care QID (4x/day)    Emmajean Ratledge B. Dreama Saa, M.S., CCC-SLP, Tree surgeon Certified Brain Injury Specialist [facility]  [facility] Rehabilitation Services Office [phone] Ascom [phone] Fax [phone]

## 2023-09-12 ENCOUNTER — Encounter: Payer: Self-pay | Admitting: Oncology

## 2023-09-12 ENCOUNTER — Other Ambulatory Visit: Payer: Self-pay

## 2023-09-12 DIAGNOSIS — E43 Unspecified severe protein-calorie malnutrition: Secondary | ICD-10-CM | POA: Diagnosis not present

## 2023-09-12 DIAGNOSIS — J690 Pneumonitis due to inhalation of food and vomit: Secondary | ICD-10-CM | POA: Diagnosis not present

## 2023-09-12 DIAGNOSIS — K5981 Ogilvie syndrome: Secondary | ICD-10-CM | POA: Diagnosis not present

## 2023-09-12 LAB — GLUCOSE, CAPILLARY
Glucose-Capillary: 87 mg/dL (ref 70–99)
Glucose-Capillary: 81 mg/dL (ref 70–99)

## 2023-09-12 LAB — BASIC METABOLIC PANEL WITH GFR
Anion gap: 13 (ref 5–15)
BUN: 40 mg/dL — ABNORMAL HIGH (ref 8–23)
CO2: 21 mmol/L — ABNORMAL LOW (ref 22–32)
Calcium: 8.5 mg/dL — ABNORMAL LOW (ref 8.9–10.3)
Chloride: 115 mmol/L — ABNORMAL HIGH (ref 98–111)
Creatinine, Ser: 1.21 mg/dL (ref 0.61–1.24)
GFR, Estimated: 59 mL/min — ABNORMAL LOW (ref >60)
Glucose, Bld: 88 mg/dL (ref 70–99)
Potassium: 4 mmol/L (ref 3.5–5.1)
Sodium: 149 mmol/L — ABNORMAL HIGH (ref 135–145)

## 2023-09-12 LAB — MAGNESIUM: Magnesium: 2.5 mg/dL — ABNORMAL HIGH (ref 1.7–2.4)

## 2023-09-12 MED ORDER — AMOXICILLIN-POT CLAVULANATE 400-57 MG/5ML PO SUSR
800.0000 mg | Freq: Two times a day (BID) | ORAL | 0 refills | Status: AC
  Filled 2023-09-12: qty 100, 3d supply, fill #0

## 2023-09-12 MED ORDER — DEXTROSE 5 % IV SOLN: INTRAVENOUS | Status: DC

## 2023-09-12 NOTE — Progress Notes (Signed)
 [facility] Liaison Note  New referral for hospice at home received from [place], [place].  Patient set to discharge home today and patient discharged home prior to me speaking with patient/family.  DME in the home:  hospital bed, rolling walker and cane.  AV RN will assess other DME needs at hospice admission visit.  Referral sent into referral intake. Referral specialist will reach out to patient's spouse and set up hospice admission visit.  Thank you for allowing participation in this patient's care.  Norris Cross, RN Nurse Liaison [phone]

## 2023-09-12 NOTE — TOC Transition Note (Signed)
 Transition of Care [facility]) - Discharge Note   Patient Details  Name: Anthony Allen MRN: [id number] Date of Birth: Mar 24, 1936  Transition of Care [facility]) CM/SW Contact:  Cherre Blanc, RN Phone Number: 09/12/2023, 9:48 AM   Clinical Narrative:     Patient is from home with his wife. Patient has a hospital bed, RW, and cane. Patient and his family discussed goals of care and they would like for the patient to return to home with Hospice. Patient's family offered choice and they would like [facility]. Diannia Ruder accepted the referral. Transportation to home arranged with [employer]   No other TOC needs identified.  Final next level of care: Home w Hospice Care Barriers to Discharge: Continued Medical Work up   Patient Goals and CMS Choice            Discharge Placement                  Name of family member notified: Sister in law [phone] Patient and family notified of of transfer: 09/12/23  Discharge Plan and Services Additional resources added to the After Visit Summary for                            [facility] Arranged: RN HH Agency:  Gaylyn Rong) Date HH Agency Contacted: 09/12/23 Time HH Agency Contacted: [phone] Representative spoke with at [facility] Agency: Diannia Ruder  Social Drivers of Health (SDOH) Interventions SDOH Screenings   Food Insecurity: No Food Insecurity (09/09/2023)  Housing: Low Risk  (09/09/2023)  Transportation Needs: No Transportation Needs (09/09/2023)  Utilities: Not At Risk (09/09/2023)  Alcohol Screen: Low Risk  (10/30/2022)  Depression (PHQ2-9): Low Risk  (03/03/2023)  Financial Resource Strain: Low Risk  (10/30/2022)  Physical Activity: Inactive (10/30/2022)  Social Connections: Moderately Isolated (09/09/2023)  Stress: No Stress Concern Present (10/30/2022)  Tobacco Use: Low Risk  (09/09/2023)     Readmission Risk Interventions     No data to display

## 2023-09-12 NOTE — Care Management Important Message (Signed)
 Important Message  Patient Details  Name: Anthony Allen MRN: [id number] Date of Birth: 1935-07-12   Important Message Given:  Yes - Medicare IM     Fronnie Urton W, CMA 09/12/2023, 10:46 AM

## 2023-09-12 NOTE — Discharge Instructions (Signed)
 Follow with hospice.

## 2023-09-12 NOTE — Discharge Summary (Signed)
 Physician Discharge Summary   Patient: Anthony Allen MRN: [id number] DOB: 20-Jun-1935  Admit date:     09/09/2023  Discharge date: 09/12/23  Discharge Physician: Marrion Coy   PCP: [name], MD   Recommendations at discharge:    Follow with hospice  Discharge Diagnoses: Principal Problem:   Aspiration pneumonia (HCC) Active Problems:   Nausea & vomiting   Ogilvie syndrome   HTN (hypertension)   HLD (hyperlipidemia)   Chronic diastolic CHF (congestive heart failure) (HCC)   Stroke (HCC)   Type 2 diabetes mellitus with diabetic chronic kidney disease (HCC)   Depression with anxiety   Protein-calorie malnutrition, severe (HCC) Hypernatremia Mild metabolic acidosis Resolved Problems:   * No resolved hospital problems. *  Hospital Course: Anthony Allen is a 88 y.o. male with medical history significant of HTN, HLD, DM, gout, stroke with right-sided weakness and dysphagia, secondary polycythemia, who presents nausea, vomiting, constipation abdominal pain.  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  Patient had a modified swallow eval, aspirated on all textures. Discussed with patient and wife, they still want patient to eat, but start comfort care and follow with hospice.   Assessment and Plan: Aspiration pneumonia [facility]):  Severe dysphagia. Patient has evidence of early aspiration pneumonia.  Continue Unasyn for now.  He has oxygen requirement, but no documented hypoxemia.  No sepsis. Patient has been evaluated by speech therapy, patient has high risk for aspiration.  He is placed on strict NPO.    Discussed with speech therapy, patient failed modified barium swallow.  Still recommend strict NPO.   Decision is made after discussing with patient and wife, no feeding tube, will allow patient to eat for comfort. Transition  to comfort care, and follow with hospice. Life expectancy is less than 4 weeks.  Continue to complete 5 days abx with augmentin     Hypokalemia  AKI Hypernatremia Improved renal function and potassium. Hypernatremia will not be able to treat.      Nausea & vomiting and abdominal pain due to possible Ogilvie syndrome:  Patient had a significant nausea vomiting, CT scan showed evidence of Ogilvie syndrome.  Patient has been seen by GI, recommended observation for next 72 hours.   Patient had multiple Bms, this has improved   HTN (hypertension):  DC all bp meds for comfort care     HLD (hyperlipidemia) No treatment   Chronic diastolic CHF (congestive heart failure) (HCC): 2D echo on 05/22/2017 showed EF of 65 to 70% with grade 1 diastolic dysfunction.  No leg edema or JVD.  CHF seem to be compensated. Gentle rehydration, no volume overload.   History of stroke (HCC) right-sided hemiparesis: Has chronic right-sided weakness, difficulty swallowing  Type 2 diabetes mellitus with diabetic chronic kidney disease (HCC): Recent A1c 5.4, well-controlled.  Patient taking Jardiance DC all treatment   Depression with anxiety    Protein-calorie malnutrition, severe (HCC): Body weight 55.8 kg, BMI 17.65             Consultants: GI Procedures performed: MBS  Disposition: Hospice care Diet recommendation:  Discharge Diet Orders (From admission, onward)     Start     Ordered   09/12/23 0000  Diet general       Comments: Pureed diet   09/12/23 0858           Dysphagia type 1 thin Liquid DISCHARGE  MEDICATION: Allergies as of 09/12/2023   No Known Allergies      Medication List     STOP taking these medications    clopidogrel 75 MG tablet Commonly known as: PLAVIX   empagliflozin 10 MG Tabs tablet Commonly known as: Jardiance   losartan 25 MG tablet Commonly known as: COZAAR   rosuvastatin 10 MG tablet Commonly known as: CRESTOR   Vitamin D-3 25 MCG (1000 UT)  Caps       TAKE these medications    acetaminophen 500 MG tablet Commonly known as: TYLENOL Take 500 mg by mouth every 6 (six) hours as needed for mild pain (pain score 1-3) or moderate pain (pain score 4-6).   ALPRAZolam 0.5 MG tablet Commonly known as: XANAX TAKE 1 TABLET(0.5 MG) BY MOUTH AT BEDTIME AS NEEDED FOR ANXIETY What changed:  how much to take how to take this when to take this reasons to take this   amoxicillin-clavulanate 400-57 MG/5ML suspension Commonly known as: AUGMENTIN Take 10 mLs (800 mg total) by mouth 2 (two) times daily for 3 days.   fluticasone 50 MCG/ACT nasal spray Commonly known as: FLONASE Place 2 sprays into both nostrils daily as needed for allergies or rhinitis.   hydrocortisone 2.5 % lotion APPLY TO THE AFFECTED AREA OF FACE EVERY OTHER DAY ALTERNATING WITH KETOCONAZOLE. APPLY TO LOWER LEGS AND ANKLES DAILY TO TWICE DAILY AS NEEDED   ketoconazole 2 % cream Commonly known as: NIZORAL APPLY TO THE AFFECTED AREA OF THE FACE EVERY OTHER DAY ALTERNATING WITH HYDROCORTISONE CREAM. USE IN THE GROIN/INNER THIGH DAILY AS NEEDED FOR RASH What changed: Another medication with the same name was removed. Continue taking this medication, and follow the directions you see here.   latanoprost 0.005 % ophthalmic solution Commonly known as: XALATAN Instill 1 drop into both eyes in the evening   mirtazapine 15 MG tablet Commonly known as: REMERON TAKE 1 TABLET(15 MG) BY MOUTH AT BEDTIME   mometasone 0.1 % lotion Commonly known as: ELOCON Apply to affected areas of the scalp QOD   timolol 0.5 % ophthalmic solution Commonly known as: TIMOPTIC Instill 1 drop into the right eye in the morning        Follow-up Information     [name], MD Follow up in 1 week(s).   Specialty: Internal Medicine Contact information: [address] [place] [address] [phone]                Discharge Exam: Ceasar Mons Weights    09/09/23 1246 09/09/23 2112 09/11/23 0500  Weight: 54.4 kg 55.8 kg 59.1 kg   General exam: Appears calm and comfortable  Respiratory system: Clear to auscultation. Respiratory effort normal. Cardiovascular system: S1 & S2 heard, RRR. No JVD, murmurs, rubs, gallops or clicks. No pedal edema. Gastrointestinal system: Abdomen is nondistended, soft and nontender. No organomegaly or masses felt. Normal bowel sounds heard. Central nervous system: Alert and oriented. No focal neurological deficits. Extremities: Symmetric 5 x 5 power. Skin: No rashes, lesions or ulcers Psychiatry: Judgement and insight appear normal. Mood & affect appropriate.    Condition at discharge: fair  The results of significant diagnostics from this hospitalization (including imaging, microbiology, ancillary and laboratory) are listed below for reference.   Imaging Studies: DG Abd 1 View Result Date: 09/11/2023 CLINICAL DATA:  Ileus.  Dysphagia. EXAM: ABDOMEN - 1 VIEW COMPARISON:  CT scan 09/09/2023 FINDINGS: Gaseous prominence the colon with mottled density favoring stool products in the distal colon  and rectum. Overall very similar appearance to the CT of 09/09/2023. No dilated small bowel observed. No compelling findings of gas in the bowel wall. Ill-defined patchy bandlike densities at the lung bases, likely from the dependent atelectasis shown on the 09/09/2023 CT. IMPRESSION: 1. Gaseous prominence of the colon with mottled density favoring stool products in the distal colon and rectum. Overall very similar appearance to the CT of 09/09/2023, likely reflecting ileus. 2. Ill-defined patchy bandlike densities at the lung bases, likely from the dependent atelectasis shown on the 09/09/2023 CT. Electronically Signed   By: Gaylyn Rong M.D.   On: 09/11/2023 17:04   DG Swallowing Func-Speech Pathology Result Date: 09/11/2023 Table formatting from the original result was not included. Modified Barium Swallow Study Patient  Details Name: Anthony Allen MRN: [id number] Date of Birth: [birth year] Today's Date: 09/11/2023 HPI/PMH: HPI: Anthony Allen is a 88 y.o. male with medical history significant of HTN, HLD, DM, gout, left pontine stroke with right-sided weakness and dysphagia, polycythemia, who presents with nausea, vomiting, constipation, abdominal pain. Chest x-ray showed low volume, negative for infiltration.  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     Most recent MRI dated 06/01/2017 revealed akcute left pontine infarct, remote right corona radiata infarct with atrophic right midbrain and pons attributed to wallerian changes, chronic small vessel ischemic gliosis in the cerebral white matter.  Per neurology Delia Heady, MD) note on 06/04/2017 "Wife describes over 2-year history of gait abnormality, hypophonia and cognitive decline."     Pt known to ST services with bedside swallow evaluation provided on 06/01/2017 and 06/04/2017 both recommending nectar thick liquids d/t coughing when consuming thin liquids. Per chart, instrumental swallow study has not been performed as of this date. This Clinical research associate spoke with pt's wife over the phone today to inquire more about pt's dysphagia since 2018. She reports some follow up ST services following discharge as well as most recently pt received HHST. Unable to view notes from these visits in EPIC. She reports that he "he progressed to thins with him thinking about it. He would always get strangled with it (thins) but had a good cough. He has always aspirated some, he has lost interested in eating d/t decreased desire to eat as well as losing his teeth, despite cutting food up for him in tiny pieces, it takes him so long to eat that he fatigues and losses interest in consuming after 1 tablespoon, he mostly drinks Ensure and I am barely getting 2 in him everyday." She further states that "he is not capable of improving but so much and he needs to be comfortable as  possible." Pt's treatment team made aware of this information. Clinical Impression: Clinical Impression: Pt presents with severe to profound sensorimotor oropharyngeal dysphagia and is at a high risk of aspiration when consuming PO intake.  Given his wife's report of continued overt s/s of aspiration (for details see clinical impression statement for Bedside Swallow Evaluation 09/10/2023) suspect pt's dysphagia is chronic and likely related to previous pontine stroke.     Pt did appear more alert after being transferred to radiology chair. As such he was able to participate in an oral motor exam which revealed rotten teeth broken off at the gums which appeared red and swollen. When consuming thin liquids and nectar thick liquids via spoon pt presented with the following deficits:    Severe Oral Phase Impairments  Oral holding  Disorganized bolus preparation  Repetitive/disordered tongue motion  Decreased oral containment of  boluses resulting in premature spillage  Oral residue collection on tongue and palate following swallow    Severe Pharyngeal Impairments  Minimal superior movement of thyroid cartilage with minimal approximation of arytenoids to epiglottic petiole  Partial to Minimal anterior hyoid excursion  Partial inversion and occasional no inversion of epiglottic  Incomplete laryngeal vestibule closure resulted in deep penetration of all consistencies  Minimal to absent pharyngeal stripping wave with majority of bolus remaining in the pharynx hanging over pt's ariway - pt with gasping behavior post swallow and was not sensate to residue, even with mutlipe cues and mutlipe swallows, pt unable to reduce residue - minimal to occasional no pharyngeal clearance - study was discontinued d/t fear that pt would gasp air inhaling large amounts of the pharyngeal residue. Comenpensatory strategies of head turns to left and right were ineffective in increasing glottal closure or reducing residue. Pt's inability to clear  pharyngeal residue aligns with his wife's report of ineffeciency when consuming 1 tablespoon of finely diced food.     At this time, given the above, a safe diet cannot be recommended. Factors that may increase risk of adverse event in presence of aspiration Rubye Oaks [employer] Clearance Coots 2021): Factors that may increase risk of adverse event in presence of aspiration Rubye Oaks [employer] Clearance Coots 2021): Poor general health and/or compromised immunity; Respiratory or GI disease; Reduced cognitive function; Frail or deconditioned; Inadequate oral hygiene; Weak cough; Aspiration of thick, dense, and/or acidic materials; Reduced saliva Recommendations/Plan: Swallowing Evaluation Recommendations Swallowing Evaluation Recommendations Recommendations: NPO Medication Administration: Via alternative means Swallowing strategies  : Minimize environmental distractions; Slow rate; Small bites/sips Oral care recommendations: Oral care QID (4x/day) Treatment Plan Treatment Plan Treatment recommendations: No treatment recommended at this time Follow-up recommendations: No SLP follow up Functional status assessment: Patient has not had a recent decline in their functional status. Recommendations Recommendations for follow up therapy are one component of a multi-disciplinary discharge planning process, led by the attending physician.  Recommendations may be updated based on patient status, additional functional criteria and insurance authorization. Assessment: Orofacial Exam: Orofacial Exam Oral Cavity: Oral Hygiene: Xerostomia Oral Cavity - Dentition: Poor condition; Missing dentition (rotten teeth many of which were borken at the gum line) Orofacial Anatomy: WFL Oral Motor/Sensory Function: Suspected cranial nerve impairment CN VII - Facial: Right motor impairment CN IX - Glossopharyngeal, CN X - Vagus: Right motor impairment CN XII - Hypoglossal: Right motor impairment Anatomy: Anatomy: WFL Boluses Administered: Boluses Administered Boluses Administered:  Mildly thick liquids (Level 2, nectar thick); Thin liquids (Level 0)  Oral Impairment Domain: Oral Impairment Domain Lip Closure: No labial escape Tongue control during bolus hold: Not tested Bolus preparation/mastication: Disorganized chewing/mashing with solid pieces of bolus unchewed Bolus transport/lingual motion: Repetitive/disorganized tongue motion Oral residue: Residue collection on oral structures Location of oral residue : Tongue; Palate Initiation of pharyngeal swallow : Valleculae; Posterior laryngeal surface of the epiglottis; Pyriform sinuses  Pharyngeal Impairment Domain: Pharyngeal Impairment Domain Soft palate elevation: No bolus between soft palate (SP)/pharyngeal wall (PW) Laryngeal elevation: Minimal superior movement of thyroid cartilage with minimal approximation of arytenoids to epiglottic petiole Anterior hyoid excursion: Partial anterior movement Epiglottic movement: Partial inversion; No inversion Laryngeal vestibule closure: Incomplete, narrow column air/contrast in laryngeal vestibule Pharyngeal stripping wave : Absent Pharyngoesophageal segment opening: Complete distension and complete duration, no obstruction of flow Tongue base retraction: Narrow column of contrast or air between tongue base and PPW Pharyngeal residue: Majority of contrast within or on pharyngeal structures; Minimal to no pharyngeal clearance  Location of pharyngeal residue: Valleculae; Pharyngeal wall; Aryepiglottic folds; Pyriform sinuses; Diffuse (>3 areas)  Esophageal Impairment Domain: Esophageal Impairment Domain Esophageal clearance upright position: Complete clearance, esophageal coating Pill: No data recorded Penetration/Aspiration Scale Score: Penetration/Aspiration Scale Score 3.  Material enters airway, remains ABOVE vocal cords and not ejected out: Thin liquids (Level 0); Mildly thick liquids (Level 2, nectar thick) 4.  Material enters airway, CONTACTS cords then ejected out: Thin liquids (Level 0); Mildly  thick liquids (Level 2, nectar thick) Compensatory Strategies: Compensatory Strategies Compensatory strategies: Yes Left head turn: Ineffective Ineffective Left Head Turn: Mildly thick liquid (Level 2, nectar thick) Right head turn: Ineffective Ineffective Right Head Turn: Mildly thick liquid (Level 2, nectar thick)   General Information: Caregiver present: No  Diet Prior to this Study: NPO   Temperature : Normal   Respiratory Status: WFL   Supplemental O2: None (Room air)   History of Recent Intubation: No  Behavior/Cognition: Alert Self-Feeding Abilities: Able to self-feed; Needs assist with self-feeding Baseline vocal quality/speech: Hypophonia/low volume Volitional Cough: Able to elicit Volitional Swallow: Unable to elicit Exam Limitations: No limitations Goal Planning: Prognosis for improved oropharyngeal function: Guarded Barriers to Reach Goals: Cognitive deficits; Time post onset; Severity of deficits Barriers/Prognosis Comment: -- (suspect related to) Patient/Family Stated Goal: "he needs to be as comfortable as possible" Consulted and agree with results and recommendations: Pt unable/family or caregiver not available; Physician; Nurse Pain: Pain Assessment Pain Assessment: No/denies pain End of Session: Start Time:SLP Start Time (ACUTE ONLY): 1300 Stop Time: SLP Stop Time (ACUTE ONLY): 1320 Time Calculation:SLP Time Calculation (min) (ACUTE ONLY): 20 min Charges: SLP Evaluations $ SLP Speech Visit: 1 Visit SLP Evaluations $BSS Swallow: 1 Procedure $MBS Swallow: 1 Procedure SLP visit diagnosis: SLP Visit Diagnosis: Dysphagia, oropharyngeal phase (R13.12) Past Medical History: Past Medical History: Diagnosis Date  Arthritis   "mild in my hands" (06/04/2017)  Basal cell carcinoma 01/28/2008  L lat nose   Complication of anesthesia   "was told never to use Propofol because of parent's adverse reaction" (06/04/2017)  CVA (cerebral vascular accident) (HCC)   hospitalized from 12/22-12/24 due to left-sided  weakness, slurred speech and difficulty swallowing. He was diagnosed as left pontine stroke./notes 06/03/2017  Family history of adverse reaction to anesthesia   "mother and dad had allergic reaction Propofol" (06/04/2017)  Glaucoma, both eyes   Gout   Hypertension   Parotid mass   Stroke (cerebrum) (HCC) 06/03/2017  Type 2 diabetes mellitus with hyperglycemia [facility])  Past Surgical History: Past Surgical History: Procedure Laterality Date  TONSILLECTOMY  1943 Happi Dreama Saa 09/11/2023, 4:48 PM  CT CHEST ABDOMEN PELVIS W CONTRAST Result Date: 09/09/2023 CLINICAL DATA:  Sepsis new o2 requirement, recent N/V, possible aspiration. EXAM: CT CHEST, ABDOMEN, AND PELVIS WITH CONTRAST TECHNIQUE: Multidetector CT imaging of the chest, abdomen and pelvis was performed following the standard protocol during bolus administration of intravenous contrast. RADIATION DOSE REDUCTION: This exam was performed according to the departmental dose-optimization program which includes automated exposure control, adjustment of the mA and/or kV according to patient size and/or use of iterative reconstruction technique. CONTRAST:  75mL OMNIPAQUE IOHEXOL 300 MG/ML  SOLN COMPARISON:  CT angiography chest from 01/26/2013. FINDINGS: CT CHEST FINDINGS Cardiovascular: Normal cardiac size. No pericardial effusion. No aortic aneurysm. There are coronary artery calcifications, in keeping with coronary artery disease. There are also moderate peripheral atherosclerotic vascular calcifications of thoracic aorta and its major branches. Mediastinum/Nodes: Visualized thyroid gland appears grossly unremarkable. No solid / cystic mediastinal masses. There  is fluid-filled and dilated esophagus, which is nonspecific but most likely seen in the settings of chronic gastroesophageal reflux disease versus esophageal dysmotility. No axillary, mediastinal or hilar lymphadenopathy by size criteria. Lungs/Pleura: The central tracheo-bronchial tree is patent. There are  dependent changes as well as patchy atelectatic changes throughout bilateral lungs. No mass or consolidation. No pleural effusion or pneumothorax. There are 2, micronodules (less than 3 mm) in the left lung upper lobe (marked with electronic arrow sign on series 2006). No suspicious lung nodule. Musculoskeletal: The visualized soft tissues of the chest wall are grossly unremarkable. No suspicious osseous lesions. There are mild multilevel degenerative changes in the visualized spine. CT ABDOMEN PELVIS FINDINGS Hepatobiliary: The liver is normal in size. Non-cirrhotic configuration. No suspicious mass. These is mild diffuse hepatic steatosis. No intrahepatic or extrahepatic bile duct dilation. Very small volume dependent faintly calcified gallstones noted without imaging signs of acute cholecystitis. Normal gallbladder wall thickness. No pericholecystic inflammatory changes. Pancreas: Unremarkable. No pancreatic ductal dilatation or surrounding inflammatory changes. Spleen: Within normal limits. No focal lesion. Adrenals/Urinary Tract: Adrenal glands are unremarkable. No suspicious renal mass. There are multiple bilateral subcentimeter hypoattenuating foci, which are too small to adequately characterize. No nephroureterolithiasis or obstructive uropathy. Unremarkable urinary bladder. Stomach/Bowel: There is dilation of the entire colon (diameter up to 7.7 cm) without obstructing mass. Findings favor Ogilvie syndrome. No dilation of small bowel loops. No evidence of abnormal bowel wall thickening or inflammatory changes. The appendix is unremarkable. Vascular/Lymphatic: No ascites or pneumoperitoneum. No abdominal or pelvic lymphadenopathy, by size criteria. No aneurysmal dilation of the major abdominal arteries. There are moderate peripheral atherosclerotic vascular calcifications of the aorta and its major branches. Reproductive: Enlarged prostate. Correlate with serum PSA levels. There is a 4 x 6 mm hypoattenuating  focus in the left side of the prostate mid gland, which may represent prostatic cyst. Symmetric seminal vesicles. Other: There is a small fat containing right inguinal hernia. The soft tissues and abdominal wall are otherwise unremarkable. Musculoskeletal: No suspicious osseous lesions. IMPRESSION: 1. No acute inflammatory process identified within the chest, abdomen or pelvis. 2. There is dilation of the entire colon without obstructing mass. Findings favor colonic pseudo-obstruction-Ogilvie syndrome. 3. Multiple other nonacute observations, as described above. Aortic Atherosclerosis (ICD10-I70.0). Electronically Signed   By: Jules Schick M.D.   On: 09/09/2023 18:05   DG Chest Port 1 View Result Date: 09/09/2023 CLINICAL DATA:  Shortness of breath.  Possible aspiration. EXAM: PORTABLE CHEST 1 VIEW COMPARISON:  Chest radiograph dated 01/26/2013. FINDINGS: Low lung volumes. The heart size and mediastinal contours are within normal limits. Aortic atherosclerosis. Mild bibasilar atelectasis. No focal consolidation, sizeable pleural effusion, or pneumothorax. No acute osseous abnormality. IMPRESSION: Low lung volumes. Mild bibasilar atelectasis. Otherwise, no acute cardiopulmonary findings Electronically Signed   By: Hart Robinsons M.D.   On: 09/09/2023 16:55    Microbiology: Results for orders placed or performed during the hospital encounter of 09/09/23  Resp panel by RT-PCR (RSV, Flu A&B, Covid) Urine, Clean Catch     Status: None   Collection Time: 09/09/23  4:08 PM   Specimen: Urine, Clean Catch; Nasal Swab  Result Value Ref Range Status   SARS Coronavirus 2 by RT PCR NEGATIVE NEGATIVE Final    Comment: (NOTE) SARS-CoV-2 target nucleic acids are NOT DETECTED.  The SARS-CoV-2 RNA is generally detectable in upper respiratory specimens during the acute phase of infection. The lowest concentration of SARS-CoV-2 viral copies this assay can detect is 138 copies/mL. A  negative result does not  preclude SARS-Cov-2 infection and should not be used as the sole basis for treatment or other patient management decisions. A negative result may occur with  improper specimen collection/handling, submission of specimen other than nasopharyngeal swab, presence of viral mutation(s) within the areas targeted by this assay, and inadequate number of viral copies(<138 copies/mL). A negative result must be combined with clinical observations, patient history, and epidemiological information. The expected result is Negative.  Fact Sheet for Patients:  BloggerCourse.com  Fact Sheet for Healthcare Providers:  SeriousBroker.it  This test is no t yet approved or cleared by the [place] FDA and  has been authorized for detection and/or diagnosis of SARS-CoV-2 by FDA under an Emergency Use Authorization (EUA). This EUA will remain  in effect (meaning this test can be used) for the duration of the COVID-19 declaration under Section 564(b)(1) of the Act, 21 U.S.C.section 360bbb-3(b)(1), unless the authorization is terminated  or revoked sooner.       Influenza A by PCR NEGATIVE NEGATIVE Final   Influenza B by PCR NEGATIVE NEGATIVE Final    Comment: (NOTE) The Xpert Xpress SARS-CoV-2/FLU/RSV plus assay is intended as an aid in the diagnosis of influenza from Nasopharyngeal swab specimens and should not be used as a sole basis for treatment. Nasal washings and aspirates are unacceptable for Xpert Xpress SARS-CoV-2/FLU/RSV testing.  Fact Sheet for Patients: BloggerCourse.com  Fact Sheet for Healthcare Providers: SeriousBroker.it  This test is not yet approved or cleared by the [place] FDA and has been authorized for detection and/or diagnosis of SARS-CoV-2 by FDA under an Emergency Use Authorization (EUA). This EUA will remain in effect (meaning this test can be used) for the  duration of the COVID-19 declaration under Section 564(b)(1) of the Act, 21 U.S.C. section 360bbb-3(b)(1), unless the authorization is terminated or revoked.     Resp Syncytial Virus by PCR NEGATIVE NEGATIVE Final    Comment: (NOTE) Fact Sheet for Patients: BloggerCourse.com  Fact Sheet for Healthcare Providers: SeriousBroker.it  This test is not yet approved or cleared by the [place] FDA and has been authorized for detection and/or diagnosis of SARS-CoV-2 by FDA under an Emergency Use Authorization (EUA). This EUA will remain in effect (meaning this test can be used) for the duration of the COVID-19 declaration under Section 564(b)(1) of the Act, 21 U.S.C. section 360bbb-3(b)(1), unless the authorization is terminated or revoked.  Performed at [facility], [address] [place], [place] [address]   Blood Culture (routine x 2)     Status: None (Preliminary result)   Collection Time: 09/09/23  4:08 PM   Specimen: BLOOD  Result Value Ref Range Status   Specimen Description BLOOD LEFT ANTECUBITAL  Final   Special Requests   Final    BOTTLES DRAWN AEROBIC AND ANAEROBIC Blood Culture adequate volume   Culture   Final    NO GROWTH 3 DAYS Performed at [facility], [address], [place], [place] [address]    Report Status PENDING  Incomplete  Blood Culture (routine x 2)     Status: None (Preliminary result)   Collection Time: 09/09/23  4:08 PM   Specimen: BLOOD  Result Value Ref Range Status   Specimen Description BLOOD RIGHT ANTECUBITAL  Final   Special Requests   Final    BOTTLES DRAWN AEROBIC AND ANAEROBIC Blood Culture results may not be optimal due to an inadequate volume of blood received in culture bottles   Culture   Final  NO GROWTH 3 DAYS Performed at [facility], [address] [place], [place] [address]    Report Status PENDING  Incomplete    Labs: CBC: Recent Labs   Lab 09/09/23 1249 09/10/23 0108  WBC 23.2* 11.6*  HGB 16.1 12.8*  HCT 45.8 36.3*  MCV 96.8 97.8  PLT 258 150   Basic Metabolic Panel: Recent Labs  Lab 09/09/23 1249 09/10/23 0108 09/11/23 0338 09/12/23 0431  NA 138 136 145 149*  K 3.7 3.4* 3.8 4.0  CL 102 102 112* 115*  CO2 26 23 22  21*  GLUCOSE 268* 183* 129* 88  BUN 50* 45* 47* 40*  CREATININE 1.56* 1.15 1.21 1.21  CALCIUM 10.0 8.6* 8.6* 8.5*  MG  --   --  2.4 2.5*   Liver Function Tests: Recent Labs  Lab 09/09/23 1249  AST 24  ALT 13  ALKPHOS 80  BILITOT 1.5*  PROT 7.1  ALBUMIN 3.9   CBG: Recent Labs  Lab 09/11/23 1159 09/11/23 1640 09/11/23 2237 09/12/23 0442 09/12/23 0833  GLUCAP 102* 125* 103* 87 81    Discharge time spent: greater than 30 minutes.  Signed: Marrion Coy, MD [facility] 09/12/2023

## 2023-09-12 NOTE — Plan of Care (Signed)
  Problem: Education: Goal: Ability to describe self-care measures that may prevent or decrease complications (Diabetes Survival Skills Education) will improve Outcome: Progressing   Problem: Fluid Volume: Goal: Ability to maintain a balanced intake and output will improve Outcome: Progressing   Problem: Skin Integrity: Goal: Risk for impaired skin integrity will decrease Outcome: Progressing   Problem: Education: Goal: Knowledge of General Education information will improve Description: Including pain rating scale, medication(s)/side effects and non-pharmacologic comfort measures Outcome: Progressing   Problem: Safety: Goal: Ability to remain free from injury will improve Outcome: Progressing   Problem: Clinical Measurements: Goal: Ability to maintain a body temperature in the normal range will improve Outcome: Progressing   Problem: Respiratory: Goal: Ability to maintain adequate ventilation will improve Outcome: Progressing

## 2023-09-14 LAB — CULTURE, BLOOD (ROUTINE X 2)
Culture: NO GROWTH
Culture: NO GROWTH
Special Requests: ADEQUATE

## 2023-09-15 ENCOUNTER — Telehealth: Payer: Self-pay

## 2023-09-15 NOTE — Telephone Encounter (Signed)
 Spoke with Roanna Raider and she stated that pt was seen over the weekend and admitted into hospice. She is needing to know if you would serve as the attending physician and if you feel that pt only has 6 months to live.

## 2023-09-15 NOTE — Telephone Encounter (Signed)
 Spoke with Sherri to let her know that Dr. Darrick Huntsman will be the attending physician.

## 2023-09-15 NOTE — Telephone Encounter (Signed)
 Copied from CRM [id number]. Topic: Referral - Question >> Sep 15, 2023  9:34 AM Ivette P wrote: Reason for CRM: Sherri called in for further questions about a referral to Hospice. Roanna Raider is requesting a callback before of end of day today. Pt was admitted Saturday 04/05 and need to obtain information by today, is a 48 hour deadline.   Callback [phone]

## 2023-09-22 ENCOUNTER — Telehealth: Payer: Self-pay | Admitting: Internal Medicine

## 2023-09-22 NOTE — Telephone Encounter (Signed)
 Copied from CRM [id number]. Topic: General - Other >> Sep 22, 2023 12:31 PM Makayla J wrote: Reason for CRM: The pts wife wnts to know if she can get a new order for a handicap sticker? Please advise Callback # [phone]

## 2023-09-22 NOTE — Telephone Encounter (Signed)
 Lm letting pt know that form is complete and ready for pick up.

## 2023-09-22 NOTE — Telephone Encounter (Signed)
I have completed and placed in quick sign folder.  ?

## 2023-09-23 ENCOUNTER — Ambulatory Visit: Admitting: Internal Medicine

## 2023-10-20 ENCOUNTER — Telehealth: Payer: Self-pay

## 2023-10-20 ENCOUNTER — Other Ambulatory Visit: Payer: Self-pay | Admitting: Family

## 2023-10-20 MED ORDER — DOXYCYCLINE HYCLATE 100 MG PO TABS
100.0000 mg | ORAL_TABLET | Freq: Two times a day (BID) | ORAL | 0 refills | Status: DC
Start: 2023-10-20 — End: 2024-01-27

## 2023-10-20 NOTE — Telephone Encounter (Signed)
 Copied from CRM [id number]. Topic: Clinical - Medical Advice >> Oct 20, 2023  4:11 PM Albertha Alosa wrote: Reason for CRM: Nurse from [employer] called to report on patient ,stated patient has started to cough up green phlegm , patient has no known allergies and would like t know if he needs to be on some antibiotics her callback number is   [phone]

## 2023-10-20 NOTE — Telephone Encounter (Signed)
 Copied from CRM [id number]. Topic: General - Other >> Oct 20, 2023 10:05 AM Adaysia C wrote: Reason for CRM: Patients wife(Lyndia) called in to ask if PCP can submit another order for a permanent handicap sticker for the patient because Lyndia signed the form by accident and the [employer] would not accept it because it has to be the patients signature; please follow up with Lyndia regarding this request [phone]

## 2023-10-20 NOTE — Telephone Encounter (Signed)
 I have completed and placed in quick sign folder for signature.

## 2023-10-20 NOTE — Telephone Encounter (Signed)
 Spoke with Melinda with [facility] to let her know that Dr. Madelon Scheuermann is out of the office until tomorrow and that she would want pt to be evaluated. Eldonna Greenspan stated that pt's wife said that anytime pt starts coughing up green mucus Dr. Madelon Scheuermann sends in an antibiotic because of the pt's history of cerebrovascular disease and aspiration pneumonia. The time the aspiration pneumonia caused him to be hospitalized. Eldonna Greenspan stated that she would let pt know that we would call her back tomorrow.

## 2023-10-21 NOTE — Telephone Encounter (Signed)
 Pt's wife is aware that medication has been sent in. Also let her know that I will call her once Dr. Madelon Scheuermann has signed the handicap form.

## 2023-11-04 ENCOUNTER — Encounter: Payer: Self-pay | Admitting: Pharmacist

## 2023-11-04 NOTE — Progress Notes (Signed)
 Pharmacy Quality Measure Review  This patient is appearing on a report for being at risk of failing the adherence measure for diabetes medications this calendar year.   Medication: Jardiance  Last fill date: 08/11/23 for 90 day supply  No longer prescribed Jardiance . Discontinued after hospital discharge.  No further action needed at this time.

## 2023-11-20 ENCOUNTER — Other Ambulatory Visit: Payer: Self-pay

## 2023-11-20 MED ORDER — MIRTAZAPINE 15 MG PO TABS
ORAL_TABLET | ORAL | 1 refills | Status: DC
Start: 2023-11-20 — End: 2024-01-27

## 2023-11-27 ENCOUNTER — Other Ambulatory Visit: Payer: Self-pay | Admitting: Internal Medicine

## 2023-12-01 NOTE — Telephone Encounter (Signed)
 Medication was stopped at discharge on 09/12/2023. Is it okay to refuse request?

## 2023-12-02 ENCOUNTER — Encounter: Payer: Self-pay | Admitting: Internal Medicine

## 2023-12-02 DIAGNOSIS — Z515 Encounter for palliative care: Secondary | ICD-10-CM | POA: Insufficient documentation

## 2024-01-21 ENCOUNTER — Encounter

## 2024-01-27 ENCOUNTER — Encounter: Payer: Self-pay | Admitting: Internal Medicine

## 2024-02-09 DEATH — deceased

## 2024-07-22 ENCOUNTER — Ambulatory Visit: Payer: Medicare PPO | Admitting: Dermatology

## 2024-07-22 ENCOUNTER — Ambulatory Visit: Admitting: Dermatology
